# Patient Record
Sex: Female | Born: 1955
Health system: Southern US, Community
[De-identification: ages and names within clinical notes are randomized; demographics above are authoritative.]

## PROBLEM LIST (undated history)

## (undated) DIAGNOSIS — M810 Age-related osteoporosis without current pathological fracture: Secondary | ICD-10-CM

## (undated) DIAGNOSIS — M8580 Other specified disorders of bone density and structure, unspecified site: Secondary | ICD-10-CM

## (undated) DIAGNOSIS — K8020 Calculus of gallbladder without cholecystitis without obstruction: Secondary | ICD-10-CM

## (undated) DIAGNOSIS — Z973 Presence of spectacles and contact lenses: Secondary | ICD-10-CM

## (undated) DIAGNOSIS — R112 Nausea with vomiting, unspecified: Secondary | ICD-10-CM

## (undated) DIAGNOSIS — K589 Irritable bowel syndrome without diarrhea: Secondary | ICD-10-CM

## (undated) DIAGNOSIS — F419 Anxiety disorder, unspecified: Secondary | ICD-10-CM

## (undated) DIAGNOSIS — IMO0001 Reserved for inherently not codable concepts without codable children: Secondary | ICD-10-CM

## (undated) DIAGNOSIS — K5792 Diverticulitis of intestine, part unspecified, without perforation or abscess without bleeding: Secondary | ICD-10-CM

## (undated) DIAGNOSIS — N760 Acute vaginitis: Secondary | ICD-10-CM

## (undated) DIAGNOSIS — Z5189 Encounter for other specified aftercare: Secondary | ICD-10-CM

## (undated) DIAGNOSIS — M722 Plantar fascial fibromatosis: Secondary | ICD-10-CM

## (undated) DIAGNOSIS — K219 Gastro-esophageal reflux disease without esophagitis: Secondary | ICD-10-CM

## (undated) DIAGNOSIS — Z9889 Other specified postprocedural states: Secondary | ICD-10-CM

## (undated) DIAGNOSIS — B009 Herpesviral infection, unspecified: Secondary | ICD-10-CM

## (undated) DIAGNOSIS — N879 Dysplasia of cervix uteri, unspecified: Secondary | ICD-10-CM

## (undated) DIAGNOSIS — Z8719 Personal history of other diseases of the digestive system: Secondary | ICD-10-CM

## (undated) DIAGNOSIS — T7840XA Allergy, unspecified, initial encounter: Secondary | ICD-10-CM

## (undated) DIAGNOSIS — M419 Scoliosis, unspecified: Secondary | ICD-10-CM

## (undated) DIAGNOSIS — M1990 Unspecified osteoarthritis, unspecified site: Secondary | ICD-10-CM

## (undated) DIAGNOSIS — R5383 Other fatigue: Secondary | ICD-10-CM

## (undated) HISTORY — DX: Irritable bowel syndrome, unspecified: K58.9

## (undated) HISTORY — PX: ROTATOR CUFF REPAIR: SHX139

## (undated) HISTORY — DX: Scoliosis, unspecified: M41.9

## (undated) HISTORY — DX: Encounter for other specified aftercare: Z51.89

## (undated) HISTORY — DX: Acute vaginitis: N76.0

## (undated) HISTORY — PX: BACK SURGERY: SHX140

## (undated) HISTORY — DX: Gastro-esophageal reflux disease without esophagitis: K21.9

## (undated) HISTORY — DX: Unspecified osteoarthritis, unspecified site: M19.90

## (undated) HISTORY — PX: PELVIC LAPAROSCOPY: SHX162

## (undated) HISTORY — DX: Plantar fascial fibromatosis: M72.2

## (undated) HISTORY — DX: Herpesviral infection, unspecified: B00.9

## (undated) HISTORY — DX: Other fatigue: R53.83

## (undated) HISTORY — DX: Dysplasia of cervix uteri, unspecified: N87.9

## (undated) HISTORY — PX: BREAST SURGERY: SHX581

## (undated) HISTORY — PX: CYSTOSCOPY: SUR368

## (undated) HISTORY — PX: GYNECOLOGIC CRYOSURGERY: SHX857

## (undated) HISTORY — DX: Presence of spectacles and contact lenses: Z97.3

## (undated) HISTORY — DX: Diverticulitis of intestine, part unspecified, without perforation or abscess without bleeding: K57.92

## (undated) HISTORY — DX: Calculus of gallbladder without cholecystitis without obstruction: K80.20

## (undated) HISTORY — DX: Allergy, unspecified, initial encounter: T78.40XA

## (undated) HISTORY — DX: Reserved for inherently not codable concepts without codable children: IMO0001

---

## 1976-05-15 HISTORY — PX: APPENDECTOMY: SHX54

## 1995-05-16 HISTORY — PX: LAPAROSCOPIC ENDOMETRIOSIS FULGURATION: SUR769

## 1995-08-02 HISTORY — PX: HERNIA REPAIR: SHX51

## 1997-12-11 ENCOUNTER — Ambulatory Visit (HOSPITAL_COMMUNITY): Admission: RE | Admit: 1997-12-11 | Discharge: 1997-12-11 | Payer: Self-pay | Admitting: Gastroenterology

## 1998-01-12 ENCOUNTER — Other Ambulatory Visit: Admission: RE | Admit: 1998-01-12 | Discharge: 1998-01-12 | Payer: Self-pay | Admitting: Obstetrics and Gynecology

## 1999-01-11 ENCOUNTER — Other Ambulatory Visit: Admission: RE | Admit: 1999-01-11 | Discharge: 1999-01-11 | Payer: Self-pay | Admitting: Obstetrics and Gynecology

## 1999-10-17 ENCOUNTER — Encounter: Admission: RE | Admit: 1999-10-17 | Discharge: 1999-10-17 | Payer: Self-pay | Admitting: Family Medicine

## 2000-02-01 ENCOUNTER — Other Ambulatory Visit: Admission: RE | Admit: 2000-02-01 | Discharge: 2000-02-01 | Payer: Self-pay | Admitting: Obstetrics and Gynecology

## 2001-02-06 ENCOUNTER — Other Ambulatory Visit: Admission: RE | Admit: 2001-02-06 | Discharge: 2001-02-06 | Payer: Self-pay | Admitting: Obstetrics and Gynecology

## 2001-02-28 ENCOUNTER — Encounter: Admission: RE | Admit: 2001-02-28 | Discharge: 2001-02-28 | Payer: Self-pay | Admitting: Orthopedic Surgery

## 2001-02-28 ENCOUNTER — Encounter: Payer: Self-pay | Admitting: Orthopedic Surgery

## 2002-02-11 ENCOUNTER — Other Ambulatory Visit: Admission: RE | Admit: 2002-02-11 | Discharge: 2002-02-11 | Payer: Self-pay | Admitting: Obstetrics and Gynecology

## 2003-02-12 ENCOUNTER — Other Ambulatory Visit: Admission: RE | Admit: 2003-02-12 | Discharge: 2003-02-12 | Payer: Self-pay | Admitting: Obstetrics and Gynecology

## 2004-03-11 ENCOUNTER — Other Ambulatory Visit: Admission: RE | Admit: 2004-03-11 | Discharge: 2004-03-11 | Payer: Self-pay | Admitting: Obstetrics and Gynecology

## 2005-03-13 ENCOUNTER — Other Ambulatory Visit: Admission: RE | Admit: 2005-03-13 | Discharge: 2005-03-13 | Payer: Self-pay | Admitting: Obstetrics and Gynecology

## 2006-03-14 ENCOUNTER — Other Ambulatory Visit: Admission: RE | Admit: 2006-03-14 | Discharge: 2006-03-14 | Payer: Self-pay | Admitting: Obstetrics and Gynecology

## 2007-02-12 ENCOUNTER — Encounter: Admission: RE | Admit: 2007-02-12 | Discharge: 2007-02-12 | Payer: Self-pay | Admitting: Obstetrics and Gynecology

## 2007-05-02 ENCOUNTER — Other Ambulatory Visit: Admission: RE | Admit: 2007-05-02 | Discharge: 2007-05-02 | Payer: Self-pay | Admitting: Obstetrics and Gynecology

## 2007-11-11 ENCOUNTER — Encounter: Admission: RE | Admit: 2007-11-11 | Discharge: 2007-11-11 | Payer: Self-pay | Admitting: Obstetrics and Gynecology

## 2008-05-20 ENCOUNTER — Encounter: Payer: Self-pay | Admitting: Obstetrics and Gynecology

## 2008-05-20 ENCOUNTER — Other Ambulatory Visit: Admission: RE | Admit: 2008-05-20 | Discharge: 2008-05-20 | Payer: Self-pay | Admitting: Obstetrics and Gynecology

## 2008-05-20 ENCOUNTER — Ambulatory Visit: Payer: Self-pay | Admitting: Obstetrics and Gynecology

## 2008-06-05 ENCOUNTER — Ambulatory Visit: Payer: Self-pay | Admitting: Obstetrics and Gynecology

## 2009-05-31 ENCOUNTER — Ambulatory Visit: Payer: Self-pay | Admitting: Obstetrics and Gynecology

## 2009-05-31 ENCOUNTER — Other Ambulatory Visit: Admission: RE | Admit: 2009-05-31 | Discharge: 2009-05-31 | Payer: Self-pay | Admitting: Obstetrics and Gynecology

## 2009-09-08 ENCOUNTER — Ambulatory Visit: Payer: Self-pay | Admitting: Obstetrics and Gynecology

## 2009-10-04 ENCOUNTER — Encounter: Admission: RE | Admit: 2009-10-04 | Discharge: 2009-10-04 | Payer: Self-pay | Admitting: Orthopaedic Surgery

## 2009-10-12 ENCOUNTER — Ambulatory Visit: Payer: Self-pay | Admitting: Obstetrics and Gynecology

## 2010-01-13 ENCOUNTER — Ambulatory Visit: Payer: Self-pay | Admitting: Obstetrics and Gynecology

## 2010-06-07 ENCOUNTER — Other Ambulatory Visit (HOSPITAL_COMMUNITY): Admission: RE | Admit: 2010-06-07 | Payer: Self-pay | Source: Ambulatory Visit | Admitting: Obstetrics and Gynecology

## 2010-06-07 ENCOUNTER — Ambulatory Visit
Admission: RE | Admit: 2010-06-07 | Discharge: 2010-06-07 | Payer: Self-pay | Source: Home / Self Care | Attending: Obstetrics and Gynecology | Admitting: Obstetrics and Gynecology

## 2010-06-07 ENCOUNTER — Other Ambulatory Visit (HOSPITAL_COMMUNITY)
Admission: RE | Admit: 2010-06-07 | Discharge: 2010-06-07 | Disposition: A | Discharge status: Home / Self Care | Payer: 59 | Source: Ambulatory Visit | Attending: Obstetrics and Gynecology | Admitting: Obstetrics and Gynecology

## 2010-06-07 ENCOUNTER — Other Ambulatory Visit: Payer: Self-pay | Admitting: Obstetrics and Gynecology

## 2010-06-07 DIAGNOSIS — Z124 Encounter for screening for malignant neoplasm of cervix: Secondary | ICD-10-CM | POA: Insufficient documentation

## 2010-10-04 ENCOUNTER — Encounter (INDEPENDENT_AMBULATORY_CARE_PROVIDER_SITE_OTHER): Payer: 59

## 2010-10-04 DIAGNOSIS — M810 Age-related osteoporosis without current pathological fracture: Secondary | ICD-10-CM

## 2010-11-28 ENCOUNTER — Other Ambulatory Visit: Payer: Self-pay | Admitting: Obstetrics and Gynecology

## 2010-11-28 DIAGNOSIS — R928 Other abnormal and inconclusive findings on diagnostic imaging of breast: Secondary | ICD-10-CM

## 2010-11-30 ENCOUNTER — Encounter (INDEPENDENT_AMBULATORY_CARE_PROVIDER_SITE_OTHER): Payer: Self-pay | Admitting: General Surgery

## 2010-12-01 ENCOUNTER — Encounter (INDEPENDENT_AMBULATORY_CARE_PROVIDER_SITE_OTHER): Payer: 59 | Admitting: General Surgery

## 2010-12-14 ENCOUNTER — Encounter (INDEPENDENT_AMBULATORY_CARE_PROVIDER_SITE_OTHER): Payer: Self-pay | Admitting: General Surgery

## 2010-12-15 ENCOUNTER — Ambulatory Visit (INDEPENDENT_AMBULATORY_CARE_PROVIDER_SITE_OTHER): Payer: 59 | Admitting: General Surgery

## 2010-12-15 ENCOUNTER — Other Ambulatory Visit: Payer: Self-pay | Admitting: Obstetrics and Gynecology

## 2010-12-15 ENCOUNTER — Encounter (INDEPENDENT_AMBULATORY_CARE_PROVIDER_SITE_OTHER): Payer: Self-pay | Admitting: General Surgery

## 2010-12-15 ENCOUNTER — Ambulatory Visit
Admission: RE | Admit: 2010-12-15 | Discharge: 2010-12-15 | Disposition: A | Discharge status: Home / Self Care | Payer: 59 | Source: Ambulatory Visit | Attending: Obstetrics and Gynecology | Admitting: Obstetrics and Gynecology

## 2010-12-15 VITALS — BP 106/72 | Ht 70.0 in | Wt 137.4 lb

## 2010-12-15 DIAGNOSIS — R928 Other abnormal and inconclusive findings on diagnostic imaging of breast: Secondary | ICD-10-CM

## 2010-12-15 DIAGNOSIS — K6289 Other specified diseases of anus and rectum: Secondary | ICD-10-CM

## 2010-12-15 NOTE — Patient Instructions (Addendum)
Continue your exercise program as a walk-in and really only used the lidocaine 5% ointment for anal pain continue keeping her stools soft never hard and return to see me in 2 months unless her having more severe pain.

## 2010-12-15 NOTE — Progress Notes (Signed)
Subjective:     Patient ID: Yolanda Carroll, female   DOB: Nov 26, 1955, 55 y.o.   MRN: [id number]  HPI The patient returns she is now approximately 2 months since I last saw her and states that she's never had any severe pain she's not noticed any blood in her stools but when she tries to walk she is aware that she is having a little discomfort around her anus that she finds the morning of. She is not in spasm today and on examination Limited predominantly to the anus is no evidence of any external hemorrhoids after cleaning the anus very thoroughly I see no evidence of a definite fissure on digital exam there is no spasm I did an anoscopic exam and can not see any evidence of a fissure anterior or posterior and does very little internal hemorrhoids  I got Dr.Toth to reexamine her and he was also not able to find any spasm but maybe a little fluid so fissural progress trauma from repeated anoscopic exams and was in agreement that he would not plan on a type of procedure at this time with the minimal type of symptom. I would encourage the patient to be as active as possible and if she is having more severe plans to see [place] sooner otherwise let me see her in 2 months  I cannot see a definite fissure at this time even though she's been treated for a fissure but Dr. Deloris Ping and when I saw her originally and also the day we've not been able to do a definite fissure she's never had any blood in her stools or having a severe anal spasm like fissure patient usually have Review of Systems     Objective:   Physical Exam     Assessment:        Plan:       Regular H. are consistent stools to prevent constipation but try not to minimize her activities I'll see the patient in 2 months and she has lidocaine ointment if needed but is not usually had a regular basis

## 2011-02-27 ENCOUNTER — Encounter: Payer: Self-pay | Admitting: Obstetrics and Gynecology

## 2011-03-03 ENCOUNTER — Ambulatory Visit (INDEPENDENT_AMBULATORY_CARE_PROVIDER_SITE_OTHER): Payer: 59

## 2011-03-03 DIAGNOSIS — Z23 Encounter for immunization: Secondary | ICD-10-CM

## 2011-03-07 ENCOUNTER — Encounter (INDEPENDENT_AMBULATORY_CARE_PROVIDER_SITE_OTHER): Payer: Self-pay | Admitting: General Surgery

## 2011-03-07 ENCOUNTER — Ambulatory Visit (INDEPENDENT_AMBULATORY_CARE_PROVIDER_SITE_OTHER): Payer: 59 | Admitting: General Surgery

## 2011-03-07 VITALS — BP 98/62 | HR 64 | Temp 97.8°F | Resp 16 | Ht 68.5 in | Wt 139.0 lb

## 2011-03-07 DIAGNOSIS — L089 Local infection of the skin and subcutaneous tissue, unspecified: Secondary | ICD-10-CM

## 2011-03-07 NOTE — Patient Instructions (Signed)
Plan small amount of Mycolog cream b.i.d. after thoroughly washing the area. Return to see [place] in 3 weeks

## 2011-03-07 NOTE — Progress Notes (Signed)
Subjective:     Patient ID: Yolanda Carroll, female   DOB: [birth year], 55 y.o.   MRN: [id number]  HPIThe patient returned and on examination is still complaining of some pain in the perianal area. When I originally saw her probably 6 months ago she had been treated for anal fissure that I could find no evidence of an actual fissure and then when I saw her approximately 2 months ago and Dr.Toth examiner and he was questioned whether there was a little spasm On rectal exam but we could not see any abscess or obvious fissure. She returns now as I recommended and states that she is having a little area of pain to the left of her anus but does not describe this as a mass   Review of Systems Current Outpatient Prescriptions  Medication Sig Dispense Refill  . calcium-vitamin D (OSCAL) 250-125 MG-UNIT per tablet Take 1 tablet by mouth daily.        . divalproex (DEPAKOTE) 500 MG 24 hr tablet Take 500 mg by mouth daily.        Marland Kitchen estradiol (ESTRACE) 0.5 MG tablet Take 0.5 mg by mouth daily.        . fish oil-omega-3 fatty acids 1000 MG capsule Take 2 g by mouth daily.        . medroxyPROGESTERone (PROVERA) 2.5 MG tablet Take 2.5 mg by mouth daily.        . Multiple Vitamins-Minerals (MULTIVITAMIN WITH MINERALS) tablet Take 1 tablet by mouth daily.        . nefazodone (SERZONE) 50 MG tablet Take 75 mg by mouth daily.        . Probiotic Product (PROBIOTIC FORMULA PO) Take by mouth daily.         Allergies  Allergen Reactions  . Darvocet (Propoxyphene N-Acetaminophen)   . Penicillins   . Percocet (Oxycodone-Acetaminophen)   . Vicodin (Hydrocodone-Acetaminophen)     Past Surgical History  Procedure Date  . Appendectomy   . Rotator cuff repair     right 2002 left 1999  . Hernia repair 08/02/1995    [facility]  . Laparoscopic endometriosis fulguration        Objective:   Physical ExamBP 98/62  Pulse 64  Temp(Src) 97.8 F (36.6 C) (Temporal)  Resp 16  Ht 5' 8.5" (1.74 m)  Wt 139 lb (63.05 kg)   BMI 20.83 kg/m2    Examination for known limb into the anus shows no evidence of any rectal spasm on digital exam and anoscopic exam shows no evidence of any problems within her anus or posterior anal fissure to the left side lateral there is a small area of centimeter or so it has multiple little block skin pole 5 or 6 little not truly pustule but definitely different than the other surrounding areas of the anus. I got Dr. Derrell Lolling to examine her and he was in agreement that these were locked skin pole but didn't think it was anything significant there we looked at his new colorectal book and could not find anything in it that for his perianal skin problem. I would recommend that she scrubbed the area twice a day Anusol Mycolog cream for 3 days and be examined her in approximately 2-3 week if the little areas persist I think I could lay have some bowel movements and one for pathology and that's it we'll turn up anything of significance. Whether these are the little areas that she is complaining of of the last few month I  am not sure that I had not recognized these previously Assessment:    Small patch of perianal skin that looks like this block and superficial pores, no evidence of a fissure or true perirectal infection. Washed the area twice a day and apply Mycolog cream to the small localized areas after and repeat examination in 3 weeks     Plan:     See above note

## 2011-03-28 ENCOUNTER — Encounter: Payer: Self-pay | Admitting: Gynecology

## 2011-03-28 DIAGNOSIS — M810 Age-related osteoporosis without current pathological fracture: Secondary | ICD-10-CM | POA: Insufficient documentation

## 2011-03-30 ENCOUNTER — Encounter (INDEPENDENT_AMBULATORY_CARE_PROVIDER_SITE_OTHER): Payer: 59 | Admitting: General Surgery

## 2011-04-05 ENCOUNTER — Encounter (INDEPENDENT_AMBULATORY_CARE_PROVIDER_SITE_OTHER): Payer: Self-pay | Admitting: General Surgery

## 2011-04-05 ENCOUNTER — Encounter: Payer: Self-pay | Admitting: Obstetrics and Gynecology

## 2011-04-05 ENCOUNTER — Ambulatory Visit (INDEPENDENT_AMBULATORY_CARE_PROVIDER_SITE_OTHER): Payer: 59 | Admitting: Obstetrics and Gynecology

## 2011-04-05 DIAGNOSIS — N644 Mastodynia: Secondary | ICD-10-CM

## 2011-04-05 DIAGNOSIS — N6019 Diffuse cystic mastopathy of unspecified breast: Secondary | ICD-10-CM

## 2011-04-05 NOTE — Progress Notes (Signed)
Patient came to see me today with a six-week history of right breast tenderness. It is intermittent but always occurs in the right upper outer quadrant of the right breast. She does take continuous HRT. She is doing well on it. We is ordered does several years ago because of mastodynia. She takes vitamin E daily. She does not use caffeine. She has screening mammogram this Summer in [place]. She needed a followup diagnostic mammogram of the left breast and that was done in [place] and was normal.   Breast exam: Olegario Shearer present. Both her breasts were inspected and are without skin changes. Both breasts were then examined both in the sitting and lying position and were without dominant lesion. She clearly however is more lumpy in the upper outer quadrant of the right breast without dominant lesion.  Assessment: Mastodynia. Fibrocystic breast disease.  Plan: She will continue estradiol half a milligram with medroxyprogesterone 2.5 mg daily but we'll take a four-day break every month. She'll start that today. She will continue 400 mg vitamin E daily. She will continue to be caffeine free. If the above persists she will call us and we will get a focused mammogram of the right breast.

## 2011-04-10 ENCOUNTER — Other Ambulatory Visit: Payer: Self-pay | Admitting: Rehabilitation

## 2011-04-10 DIAGNOSIS — M542 Cervicalgia: Secondary | ICD-10-CM

## 2011-04-11 ENCOUNTER — Ambulatory Visit
Admission: RE | Admit: 2011-04-11 | Discharge: 2011-04-11 | Disposition: A | Discharge status: Home / Self Care | Payer: 59 | Source: Ambulatory Visit | Attending: Rehabilitation | Admitting: Rehabilitation

## 2011-04-11 ENCOUNTER — Encounter (INDEPENDENT_AMBULATORY_CARE_PROVIDER_SITE_OTHER): Payer: 59 | Admitting: General Surgery

## 2011-04-11 DIAGNOSIS — M542 Cervicalgia: Secondary | ICD-10-CM

## 2011-04-14 ENCOUNTER — Other Ambulatory Visit: Payer: Self-pay | Admitting: Neurosurgery

## 2011-04-14 ENCOUNTER — Encounter (HOSPITAL_COMMUNITY): Payer: Self-pay | Admitting: *Deleted

## 2011-04-14 ENCOUNTER — Encounter (HOSPITAL_COMMUNITY): Payer: Self-pay | Admitting: Pharmacy Technician

## 2011-04-16 MED ORDER — VANCOMYCIN HCL IN DEXTROSE 1-5 GM/200ML-% IV SOLN
1000.0000 mg | INTRAVENOUS | Status: AC
  Administered 2011-04-17: 1000 mg via INTRAVENOUS
  Filled 2011-04-16 (×3): qty 200

## 2011-04-17 ENCOUNTER — Encounter (HOSPITAL_COMMUNITY)
Admission: RE | Disposition: A | Discharge status: Home / Self Care | Payer: Self-pay | Source: Ambulatory Visit | Attending: Neurosurgery

## 2011-04-17 ENCOUNTER — Encounter (HOSPITAL_COMMUNITY): Payer: Self-pay | Admitting: Certified Registered"

## 2011-04-17 ENCOUNTER — Ambulatory Visit (HOSPITAL_COMMUNITY)
Admission: RE | Admit: 2011-04-17 | Discharge: 2011-04-18 | Disposition: A | Discharge status: Home / Self Care | Payer: 59 | Source: Ambulatory Visit | Attending: Neurosurgery | Admitting: Neurosurgery

## 2011-04-17 ENCOUNTER — Ambulatory Visit (HOSPITAL_COMMUNITY): Payer: 59

## 2011-04-17 ENCOUNTER — Encounter (HOSPITAL_COMMUNITY): Payer: Self-pay | Admitting: *Deleted

## 2011-04-17 ENCOUNTER — Encounter (INDEPENDENT_AMBULATORY_CARE_PROVIDER_SITE_OTHER): Payer: 59 | Admitting: General Surgery

## 2011-04-17 ENCOUNTER — Ambulatory Visit (HOSPITAL_COMMUNITY): Payer: 59 | Admitting: Certified Registered"

## 2011-04-17 DIAGNOSIS — M5010 Cervical disc disorder with radiculopathy, unspecified cervical region: Secondary | ICD-10-CM | POA: Diagnosis present

## 2011-04-17 DIAGNOSIS — M5020 Other cervical disc displacement, unspecified cervical region: Secondary | ICD-10-CM | POA: Insufficient documentation

## 2011-04-17 DIAGNOSIS — M810 Age-related osteoporosis without current pathological fracture: Secondary | ICD-10-CM | POA: Insufficient documentation

## 2011-04-17 DIAGNOSIS — M5030 Other cervical disc degeneration, unspecified cervical region: Secondary | ICD-10-CM | POA: Insufficient documentation

## 2011-04-17 DIAGNOSIS — K219 Gastro-esophageal reflux disease without esophagitis: Secondary | ICD-10-CM | POA: Insufficient documentation

## 2011-04-17 DIAGNOSIS — M47812 Spondylosis without myelopathy or radiculopathy, cervical region: Secondary | ICD-10-CM | POA: Insufficient documentation

## 2011-04-17 DIAGNOSIS — R51 Headache: Secondary | ICD-10-CM | POA: Insufficient documentation

## 2011-04-17 HISTORY — DX: Other specified postprocedural states: R11.2

## 2011-04-17 HISTORY — PX: ANTERIOR CERVICAL DECOMP/DISCECTOMY FUSION: SHX1161

## 2011-04-17 HISTORY — DX: Other specified postprocedural states: Z98.890

## 2011-04-17 LAB — SURGICAL PCR SCREEN
MRSA, PCR: NEGATIVE
Staphylococcus aureus: NEGATIVE

## 2011-04-17 LAB — CBC
HCT: 38 % (ref 36.0–46.0)
Hemoglobin: 12.6 g/dL (ref 12.0–15.0)
MCH: 30.5 pg (ref 26.0–34.0)
MCHC: 33.2 g/dL (ref 30.0–36.0)
MCV: 92 fL (ref 78.0–100.0)
Platelets: 193 10*3/uL (ref 150–400)
RBC: 4.13 MIL/uL (ref 3.87–5.11)
RDW: 12.6 % (ref 11.5–15.5)
WBC: 5.3 10*3/uL (ref 4.0–10.5)

## 2011-04-17 SURGERY — ANTERIOR CERVICAL DECOMPRESSION/DISCECTOMY FUSION 2 LEVELS
Anesthesia: General | Site: Neck | Wound class: Clean

## 2011-04-17 MED ORDER — SODIUM CHLORIDE 0.9 % IV SOLN: 250.0000 mL | INTRAVENOUS | Status: DC

## 2011-04-17 MED ORDER — HYDROCODONE-ACETAMINOPHEN 5-325 MG PO TABS: 1.0000 | ORAL_TABLET | ORAL | Status: DC | PRN

## 2011-04-17 MED ORDER — BUPIVACAINE HCL (PF) 0.25 % IJ SOLN
INTRAMUSCULAR | Status: DC | PRN
  Administered 2011-04-17: 4.75 mL

## 2011-04-17 MED ORDER — SODIUM CHLORIDE 0.9 % IJ SOLN: 3.0000 mL | INTRAMUSCULAR | Status: DC | PRN

## 2011-04-17 MED ORDER — SODIUM CHLORIDE 0.9 % IR SOLN
Status: DC | PRN
  Administered 2011-04-17: 09:00:00

## 2011-04-17 MED ORDER — MAGNESIUM HYDROXIDE 400 MG/5ML PO SUSP: 30.0000 mL | Freq: Every day | ORAL | Status: DC | PRN

## 2011-04-17 MED ORDER — LACTATED RINGERS IV SOLN
INTRAVENOUS | Status: DC | PRN
  Administered 2011-04-17 (×2): via INTRAVENOUS

## 2011-04-17 MED ORDER — SCOPOLAMINE 1 MG/3DAYS TD PT72
MEDICATED_PATCH | TRANSDERMAL | Status: DC | PRN
  Administered 2011-04-17: 1 via TRANSDERMAL

## 2011-04-17 MED ORDER — KCL IN DEXTROSE-NACL 20-5-0.45 MEQ/L-%-% IV SOLN
INTRAVENOUS | Status: DC
  Administered 2011-04-17: 14:00:00 via INTRAVENOUS
  Filled 2011-04-17 (×5): qty 1000

## 2011-04-17 MED ORDER — ACETAMINOPHEN 650 MG RE SUPP: 650.0000 mg | RECTAL | Status: DC | PRN

## 2011-04-17 MED ORDER — MIDAZOLAM HCL 5 MG/5ML IJ SOLN
INTRAMUSCULAR | Status: DC | PRN
  Administered 2011-04-17: 2 mg via INTRAVENOUS

## 2011-04-17 MED ORDER — LIDOCAINE-EPINEPHRINE 1 %-1:100000 IJ SOLN
INTRAMUSCULAR | Status: DC | PRN
  Administered 2011-04-17: 4.75 mL

## 2011-04-17 MED ORDER — CYCLOBENZAPRINE HCL 10 MG PO TABS
10.0000 mg | ORAL_TABLET | Freq: Three times a day (TID) | ORAL | Status: DC | PRN
  Filled 2011-04-17: qty 1

## 2011-04-17 MED ORDER — THROMBIN 5000 UNITS EX KIT
PACK | CUTANEOUS | Status: DC | PRN
  Administered 2011-04-17: 5000 [IU] via TOPICAL

## 2011-04-17 MED ORDER — DIVALPROEX SODIUM ER 500 MG PO TB24
500.0000 mg | ORAL_TABLET | Freq: Every day | ORAL | Status: DC
  Administered 2011-04-18: 500 mg via ORAL
  Filled 2011-04-17 (×2): qty 1

## 2011-04-17 MED ORDER — HYDROXYZINE HCL 50 MG PO TABS
50.0000 mg | ORAL_TABLET | ORAL | Status: DC | PRN
  Filled 2011-04-17: qty 1

## 2011-04-17 MED ORDER — LIDOCAINE HCL (CARDIAC) 20 MG/ML IV SOLN
INTRAVENOUS | Status: DC | PRN
  Administered 2011-04-17: 50 mg via INTRAVENOUS

## 2011-04-17 MED ORDER — PROMETHAZINE HCL 25 MG/ML IJ SOLN: 6.2500 mg | INTRAMUSCULAR | Status: DC | PRN

## 2011-04-17 MED ORDER — SODIUM CHLORIDE 0.9 % IR SOLN
Status: DC | PRN
  Administered 2011-04-17: 1000 mL

## 2011-04-17 MED ORDER — NEOSTIGMINE METHYLSULFATE 1 MG/ML IJ SOLN
INTRAMUSCULAR | Status: DC | PRN
  Administered 2011-04-17: 3 mg via INTRAVENOUS

## 2011-04-17 MED ORDER — ONDANSETRON HCL 4 MG/2ML IJ SOLN
INTRAMUSCULAR | Status: DC | PRN
  Administered 2011-04-17: 4 mg via INTRAVENOUS

## 2011-04-17 MED ORDER — SODIUM CHLORIDE 0.9 % IJ SOLN
3.0000 mL | Freq: Two times a day (BID) | INTRAMUSCULAR | Status: DC
  Administered 2011-04-17: 3 mL via INTRAVENOUS

## 2011-04-17 MED ORDER — KETOROLAC TROMETHAMINE 30 MG/ML IJ SOLN
30.0000 mg | Freq: Four times a day (QID) | INTRAMUSCULAR | Status: DC
  Administered 2011-04-17 – 2011-04-18 (×4): 30 mg via INTRAVENOUS
  Filled 2011-04-17 (×8): qty 1

## 2011-04-17 MED ORDER — EPHEDRINE SULFATE 50 MG/ML IJ SOLN
INTRAMUSCULAR | Status: DC | PRN
  Administered 2011-04-17 (×3): 10 mg via INTRAVENOUS

## 2011-04-17 MED ORDER — KETOROLAC TROMETHAMINE 30 MG/ML IJ SOLN: 30.0000 mg | Freq: Once | INTRAMUSCULAR | Status: DC

## 2011-04-17 MED ORDER — ZOLPIDEM TARTRATE 10 MG PO TABS: 10.0000 mg | ORAL_TABLET | Freq: Every evening | ORAL | Status: DC | PRN

## 2011-04-17 MED ORDER — MUPIROCIN 2 % EX OINT
TOPICAL_OINTMENT | Freq: Two times a day (BID) | CUTANEOUS | Status: DC
  Administered 2011-04-17 (×2): via NASAL

## 2011-04-17 MED ORDER — FENTANYL CITRATE 0.05 MG/ML IJ SOLN
50.0000 ug | Freq: Once | INTRAMUSCULAR | Status: AC
  Administered 2011-04-17: 50 ug via INTRAVENOUS

## 2011-04-17 MED ORDER — MUPIROCIN 2 % EX OINT
TOPICAL_OINTMENT | CUTANEOUS | Status: AC
  Filled 2011-04-17: qty 22

## 2011-04-17 MED ORDER — GENTAMICIN IN SALINE 1.6-0.9 MG/ML-% IV SOLN
80.0000 mg | INTRAVENOUS | Status: AC
  Administered 2011-04-17: 80 mg via INTRAVENOUS
  Filled 2011-04-17: qty 50

## 2011-04-17 MED ORDER — GLYCOPYRROLATE 0.2 MG/ML IJ SOLN
INTRAMUSCULAR | Status: DC | PRN
  Administered 2011-04-17: .4 mg via INTRAVENOUS

## 2011-04-17 MED ORDER — HYDROMORPHONE HCL 2 MG PO TABS
2.0000 mg | ORAL_TABLET | ORAL | Status: DC | PRN
  Administered 2011-04-17 – 2011-04-18 (×3): 4 mg via ORAL
  Filled 2011-04-17 (×3): qty 2

## 2011-04-17 MED ORDER — ALUM & MAG HYDROXIDE-SIMETH 400-400-40 MG/5ML PO SUSP
30.0000 mL | Freq: Four times a day (QID) | ORAL | Status: DC | PRN
  Filled 2011-04-17: qty 30

## 2011-04-17 MED ORDER — MIDAZOLAM HCL 2 MG/2ML IJ SOLN: 0.5000 mg | Freq: Once | INTRAMUSCULAR | Status: DC | PRN

## 2011-04-17 MED ORDER — MEPERIDINE HCL 25 MG/ML IJ SOLN: 6.2500 mg | INTRAMUSCULAR | Status: DC | PRN

## 2011-04-17 MED ORDER — PHENOL 1.4 % MT LIQD
1.0000 | OROMUCOSAL | Status: DC | PRN
Start: 2011-04-17 — End: 2011-04-18

## 2011-04-17 MED ORDER — SUFENTANIL CITRATE 50 MCG/ML IV SOLN
INTRAVENOUS | Status: DC | PRN
  Administered 2011-04-17 (×2): 5 ug via INTRAVENOUS
  Administered 2011-04-17 (×2): 10 ug via INTRAVENOUS
  Administered 2011-04-17 (×2): 5 ug via INTRAVENOUS
  Administered 2011-04-17: 10 ug via INTRAVENOUS

## 2011-04-17 MED ORDER — DOCUSATE SODIUM 100 MG PO CAPS
100.0000 mg | ORAL_CAPSULE | Freq: Two times a day (BID) | ORAL | Status: DC
  Administered 2011-04-17: 100 mg via ORAL
  Filled 2011-04-17: qty 1

## 2011-04-17 MED ORDER — HEMOSTATIC AGENTS (NO CHARGE) OPTIME
TOPICAL | Status: DC | PRN
  Administered 2011-04-17: 1 via TOPICAL

## 2011-04-17 MED ORDER — THROMBIN 20000 UNITS EX KIT
PACK | CUTANEOUS | Status: DC | PRN
  Administered 2011-04-17: 10:00:00 via TOPICAL

## 2011-04-17 MED ORDER — PROPOFOL 10 MG/ML IV EMUL
INTRAVENOUS | Status: DC | PRN
  Administered 2011-04-17: 170 mg via INTRAVENOUS

## 2011-04-17 MED ORDER — MORPHINE SULFATE 4 MG/ML IJ SOLN
4.0000 mg | INTRAMUSCULAR | Status: DC | PRN
  Administered 2011-04-17: 4 mg via INTRAMUSCULAR
  Filled 2011-04-17: qty 1

## 2011-04-17 MED ORDER — HYDROXYZINE HCL 50 MG/ML IM SOLN: 50.0000 mg | INTRAMUSCULAR | Status: DC | PRN

## 2011-04-17 MED ORDER — ACETAMINOPHEN 325 MG PO TABS: 650.0000 mg | ORAL_TABLET | ORAL | Status: DC | PRN

## 2011-04-17 MED ORDER — MENTHOL 3 MG MT LOZG: 1.0000 | LOZENGE | OROMUCOSAL | Status: DC | PRN

## 2011-04-17 MED ORDER — ROCURONIUM BROMIDE 100 MG/10ML IV SOLN
INTRAVENOUS | Status: DC | PRN
  Administered 2011-04-17: 40 mg via INTRAVENOUS
  Administered 2011-04-17: 10 mg via INTRAVENOUS

## 2011-04-17 MED ORDER — GENTAMICIN IN SALINE 1.6-0.9 MG/ML-% IV SOLN
INTRAVENOUS | Status: AC
  Filled 2011-04-17: qty 50

## 2011-04-17 MED ORDER — NEFAZODONE HCL 150 MG PO TABS
75.0000 mg | ORAL_TABLET | Freq: Every day | ORAL | Status: DC
  Filled 2011-04-17 (×2): qty 1

## 2011-04-17 SURGICAL SUPPLY — 56 items
ADH SKN CLS APL DERMABOND .7 (GAUZE/BANDAGES/DRESSINGS) ×1
ALLOGRAFT CA 6X14X11 (Bone Implant) ×2 IMPLANT
BAG DECANTER FOR FLEXI CONT (MISCELLANEOUS) ×2 IMPLANT
BIT DRILL NEURO 2X3.1 SFT TUCH (MISCELLANEOUS) ×1 IMPLANT
BLADE ULTRA TIP 2M (BLADE) ×2 IMPLANT
BRUSH SCRUB EZ PLAIN DRY (MISCELLANEOUS) ×2 IMPLANT
CANISTER SUCTION 2500CC (MISCELLANEOUS) ×2 IMPLANT
CLOTH BEACON ORANGE TIMEOUT ST (SAFETY) ×2 IMPLANT
COLLAR UNIVERSAL CERV 1022 00 (SOFTGOODS) ×1 IMPLANT
CONT SPEC 4OZ CLIKSEAL STRL BL (MISCELLANEOUS) ×2 IMPLANT
COVER MAYO STAND STRL (DRAPES) ×2 IMPLANT
DECANTER SPIKE VIAL GLASS SM (MISCELLANEOUS) ×2 IMPLANT
DERMABOND ADVANCED (GAUZE/BANDAGES/DRESSINGS) ×1
DERMABOND ADVANCED .7 DNX12 (GAUZE/BANDAGES/DRESSINGS) ×1 IMPLANT
DRAPE LAPAROTOMY 100X72 PEDS (DRAPES) ×2 IMPLANT
DRAPE MICROSCOPE LEICA (MISCELLANEOUS) ×2 IMPLANT
DRAPE POUCH INSTRU U-SHP 10X18 (DRAPES) ×2 IMPLANT
DRAPE PROXIMA HALF (DRAPES) ×1 IMPLANT
DRILL NEURO 2X3.1 SOFT TOUCH (MISCELLANEOUS) ×2
ELECT COATED BLADE 2.86 ST (ELECTRODE) ×2 IMPLANT
ELECT REM PT RETURN 9FT ADLT (ELECTROSURGICAL) ×2
ELECTRODE REM PT RTRN 9FT ADLT (ELECTROSURGICAL) ×1 IMPLANT
GLOVE BIO SURGEON STRL SZ8.5 (GLOVE) ×1 IMPLANT
GLOVE BIOGEL PI IND STRL 8 (GLOVE) ×1 IMPLANT
GLOVE BIOGEL PI INDICATOR 8 (GLOVE) ×1
GLOVE ECLIPSE 7.5 STRL STRAW (GLOVE) ×5 IMPLANT
GLOVE EXAM NITRILE LRG STRL (GLOVE) IMPLANT
GLOVE EXAM NITRILE MD LF STRL (GLOVE) ×1 IMPLANT
GLOVE EXAM NITRILE XL STR (GLOVE) IMPLANT
GLOVE EXAM NITRILE XS STR PU (GLOVE) IMPLANT
GLOVE SS BIOGEL STRL SZ 8 (GLOVE) IMPLANT
GLOVE SUPERSENSE BIOGEL SZ 8 (GLOVE) ×1
GOWN BRE IMP SLV AUR LG STRL (GOWN DISPOSABLE) ×1 IMPLANT
GOWN BRE IMP SLV AUR XL STRL (GOWN DISPOSABLE) ×2 IMPLANT
GOWN STRL REIN 2XL LVL4 (GOWN DISPOSABLE) IMPLANT
HEAD HALTER (SOFTGOODS) ×2 IMPLANT
KIT BASIN OR (CUSTOM PROCEDURE TRAY) ×2 IMPLANT
KIT ROOM TURNOVER OR (KITS) ×2 IMPLANT
NDL HYPO 25X1 1.5 SAFETY (NEEDLE) ×1 IMPLANT
NDL SPNL 22GX3.5 QUINCKE BK (NEEDLE) ×1 IMPLANT
NEEDLE HYPO 25X1 1.5 SAFETY (NEEDLE) ×2 IMPLANT
NEEDLE SPNL 22GX3.5 QUINCKE BK (NEEDLE) ×2 IMPLANT
NS IRRIG 1000ML POUR BTL (IV SOLUTION) ×2 IMPLANT
PACK LAMINECTOMY NEURO (CUSTOM PROCEDURE TRAY) ×2 IMPLANT
PAD ARMBOARD 7.5X6 YLW CONV (MISCELLANEOUS) ×6 IMPLANT
PATTIES SURGICAL 1X1 (DISPOSABLE) ×1 IMPLANT
RUBBERBAND STERILE (MISCELLANEOUS) ×4 IMPLANT
SPONGE INTESTINAL PEANUT (DISPOSABLE) ×2 IMPLANT
SPONGE SURGIFOAM ABS GEL 100 (HEMOSTASIS) ×1 IMPLANT
SPONGE SURGIFOAM ABS GEL SZ50 (HEMOSTASIS) ×2 IMPLANT
SUT VIC AB 2-0 CP2 18 (SUTURE) ×2 IMPLANT
SUT VIC AB 3-0 SH 8-18 (SUTURE) ×2 IMPLANT
SYR 20ML ECCENTRIC (SYRINGE) ×2 IMPLANT
TOWEL OR 17X24 6PK STRL BLUE (TOWEL DISPOSABLE) ×2 IMPLANT
TOWEL OR 17X26 10 PK STRL BLUE (TOWEL DISPOSABLE) ×2 IMPLANT
WATER STERILE IRR 1000ML POUR (IV SOLUTION) ×2 IMPLANT

## 2011-04-17 NOTE — Plan of Care (Signed)
Problem: Consults Goal: Diagnosis - Spinal Surgery Cervical Spine Fusion     

## 2011-04-17 NOTE — Progress Notes (Signed)
Filed Vitals:   04/17/11 1115 04/17/11 1130 04/17/11 1208 04/17/11 1600  BP:   121/72 95/58  Pulse: 74 83 79 83  Temp:  97.8 F (36.6 C) 97.5 F (36.4 C) 97.8 F (36.6 C)  TempSrc:      Resp: 8 12 16 16   Weight:      SpO2: 100% 100% 100% 98%    CBC  Basename 04/17/11 0651  WBC 5.3  HGB 12.6  HCT 38.0  PLT 193    Patient is up and ambulating in the halls. She notes excellent relief of her right upper extremity pain. On exam her incision is clean and dry she is wearing her soft collar. We find the strength is much improved in the right triceps, the right biceps is 5 the right triceps is 4+ to 5. The patient has voided.   Plan: Continue postoperative support IV is to be changed to a saline lock once taking well by mouth.

## 2011-04-17 NOTE — Transfer of Care (Signed)
Immediate Anesthesia Transfer of Care Note  Patient: Yolanda Carroll  Procedure(s) Performed:  ANTERIOR CERVICAL DECOMPRESSION/DISCECTOMY FUSION 2 LEVELS - Cervical five-six, six-seven anterior cervical decompression with fusion,  plating,  and bonegraft   Patient Location: PACU  Anesthesia Type: General  Level of Consciousness: awake, alert  and oriented  Airway & Oxygen Therapy: Patient Spontanous Breathing and Patient connected to nasal cannula oxygen  Post-op Assessment: Report given to PACU RN  Post vital signs: Reviewed and stable  Complications: No apparent anesthesia complications

## 2011-04-17 NOTE — Anesthesia Preprocedure Evaluation (Addendum)
Anesthesia Evaluation  Patient identified by MRN, date of birth, ID band Patient awake    Reviewed: Allergy & Precautions, H&P , NPO status , Patient's Chart, lab work & pertinent test results, reviewed documented beta blocker date and time   History of Anesthesia Complications (+) PONV  Airway Mallampati: II TM Distance: >3 FB Neck ROM: Full    Dental  (+) Teeth Intact and Dental Advisory Given   Pulmonary          Cardiovascular     Neuro/Psych  Headaches,    GI/Hepatic GERD-  ,  Endo/Other    Renal/GU      Musculoskeletal   Abdominal   Peds  Hematology   Anesthesia Other Findings   Reproductive/Obstetrics                         Anesthesia Physical Anesthesia Plan  ASA: II  Anesthesia Plan: General   Post-op Pain Management:    Induction: Intravenous  Airway Management Planned: Oral ETT  Additional Equipment:   Intra-op Plan:   Post-operative Plan: Extubation in OR  Informed Consent: I have reviewed the patients History and Physical, chart, labs and discussed the procedure including the risks, benefits and alternatives for the proposed anesthesia with the patient or authorized representative who has indicated his/her understanding and acceptance.   Dental advisory given  Plan Discussed with: CRNA  Anesthesia Plan Comments:         Anesthesia Quick Evaluation

## 2011-04-17 NOTE — H&P (Signed)
Subjective: Patient is a 55 y.o. female who is admitted for treatment of neck pain and right cervical radiculopathy secondary to cervical disc herniations superimposed on cervical spondylosis and cervical degenerative disc disease at the C5-6 and C6-7 levels. Symptoms began 3 weeks ago with pain in the upper right back and steadily worsened with pain extending down to the right shoulder arm and 4. Patient was treated with prednisone Dosepak hydrocodone muscle relaxants tramadol and Neurontin none of these gave her much in the way of relief other than for the tramadol but that caused some itching. Patient underwent chiropractic treatments without relief. Patient was studied with MRI scan and x-rays which revealed cervical disc herniations superimposed upon spondylosis and degenerative disc disease causing significant nerve root compression corresponding to a radiculopathy.    Patient Active Problem List  Diagnoses Date Noted  . Cervical disc disorder with radiculopathy of cervical region 04/17/2011  . Endometriosis   . Recurrent vaginitis   . Osteoporosis    Past Medical History  Diagnosis Date  . Arthritis   . GERD (gastroesophageal reflux disease)   . Scoliosis   . Migraine   . Allergy   . Night sweats   . Fatigue   . Wears glasses   . Reflux   . Endometriosis   . Recurrent vaginitis   . Osteoporosis   . IBS (irritable bowel syndrome)   . PONV (postoperative nausea and vomiting)     also difficult to wake up    Past Surgical History  Procedure Date  . Rotator cuff repair     right 2002 left 2000  . Hernia repair 08/02/1995    [facility]  . Laparoscopic endometriosis fulguration 1997  . Pelvic laparoscopy   . Cystoscopy   . Appendectomy 1978    Prescriptions prior to admission  Medication Sig Dispense Refill  . calcium-vitamin D (OSCAL) 250-125 MG-UNIT per tablet Take 1 tablet by mouth daily.        . Cholecalciferol (VITAMIN D PO) Take by mouth.        . divalproex (DEPAKOTE)  500 MG 24 hr tablet Take 500 mg by mouth daily.        Marland Kitchen estradiol (ESTRACE) 0.5 MG tablet Take 0.5 mg by mouth daily.        . fish oil-omega-3 fatty acids 1000 MG capsule Take 2 g by mouth daily.        Marland Kitchen gabapentin (NEURONTIN) 300 MG capsule Take 600 mg by mouth at bedtime.        Marland Kitchen HYDROcodone-acetaminophen (NORCO) 5-325 MG per tablet Take 1 tablet by mouth every 6 (six) hours as needed. For pain        . medroxyPROGESTERone (PROVERA) 2.5 MG tablet Take 2.5 mg by mouth daily.        . Multiple Vitamins-Minerals (MULTIVITAMIN WITH MINERALS) tablet Take 1 tablet by mouth daily.        . nefazodone (SERZONE) 50 MG tablet Take 75 mg by mouth daily.        Marland Kitchen OVER THE COUNTER MEDICATION 4 tablets by Per post-pyloric tube route daily. ISOCORT       . OVER THE COUNTER MEDICATION Take 1 tablet by mouth daily. excitaplus       . Probiotic Product (PROBIOTIC FORMULA PO) Take by mouth daily.         Allergies  Allergen Reactions  . Darvocet (Propoxyphene N-Acetaminophen)   . Penicillins   . Percocet (Oxycodone-Acetaminophen)   . Vicodin (Hydrocodone-Acetaminophen)   .  Nortriptyline Itching  . Tramadol Itching    History  Substance Use Topics  . Smoking status: Former Smoker    Quit date: 05/17/1983  . Smokeless tobacco: Never Used  . Alcohol Use: 0.5 oz/week    1 drink(s) per week     socially, occasional    Family History  Problem Relation Age of Onset  . Cancer Father     lymphoma  . Other Mother     bipolar,reflux  . Bipolar disorder Mother   . Other Brother     sinus problems  . Cancer Maternal Aunt     uterine cancer  . Breast cancer Maternal Aunt   . Diabetes Maternal Aunt   . Cancer Paternal Aunt     Colon cancer     Review of Systems A comprehensive review of systems was negative.  Objective: Vital signs in last 24 hours: Temp:  [98.6 F (37 C)] 98.6 F (37 C) (12/03 0616) Pulse Rate:  [72] 72  (12/03 0616) Resp:  [18] 18  (12/03 0616) BP: (103)/(67) 103/67  mmHg (12/03 0616) SpO2:  [100 %] 100 % (12/03 0616) Weight:  [63.05 kg (139 lb)] 139 lb (63.05 kg) (12/03 0454)  EXAM: Physical examination shows a well-developed well-nourished female in no acute distress but in discomfort. Lungs clear to auscultation she is symmetrical respiratory excursion heart has a regular rate and rhythm normal S1 and S2 there is no murmur abdomen soft nontender and nondistended bowel sounds are present. Extremity examination shows no clubbing cyanosis or edema muscular skeletal examination shows no tenderness to palpation over the cervical spinous processes or paracervical musculature she has a full range of motion neck flexion extension and lateral flexion to either side. Neurologic examination shows five over 5 strength in the deltoid and biceps bilaterally her the left triceps is 5 the right triceps is 4 feet and intrinsics and grip are 5 over 5 bilaterally. Sensation is intact to pinprick. Reflexes show diminished right triceps reflex without evidence of pathologic reflexes toes are downgoing.  Data Review:CBC    Component Value Date/Time   WBC 5.3 04/17/2011 0651   RBC 4.13 04/17/2011 0651   HGB 12.6 04/17/2011 0651   HCT 38.0 04/17/2011 0651   PLT 193 04/17/2011 0651   MCV 92.0 04/17/2011 0651   MCH 30.5 04/17/2011 0651   MCHC 33.2 04/17/2011 0651   RDW 12.6 04/17/2011 0651                          BMET No results found for this basename: na, k, cl, co2, glucose, bun, creatinine, calcium, gfrnonaa, gfraa     Assessment/Plan: Cervical radiculopathy and neck pain secondary to cervical disc herniation superimposed upon cervical spondylosis and degenerative disease at the C5-6 and C6-7 levels. Patient is brought to surgery for 2 level CV-6 and C6-7 anterior cervical decompression and arthrodesis. I discussed the nature of her condition the nature of surgery alternatives to surgery and risks of surgery with the patient. Understanding the alternatives and risks the patient  wished to proceed with surgery and is admitted for such.   Hewitt Shorts, MD 04/17/2011 7:39 AM

## 2011-04-17 NOTE — Preoperative (Signed)
Beta Blockers   Reason not to administer Beta Blockers:Not Applicable 

## 2011-04-17 NOTE — Op Note (Signed)
Patient was brought to the operating room placed under general endotracheal anesthesia. Patient was placed in 10 pounds of halter traction. The neck was prepped with Betadine soap and solution and draped in a sterile fashion. A horizontal incision was made on the left side of the neck. The line of the incision was infiltrated with local anesthetic with epinephrine. Dissection was carried down thru the subcutaneous tissue and platysma, bipolar cautery was used to maintain hemostasis. Dissection was then carried out thru an avascular plane leaving the sternocleidomastoid carotid artery and jugular vein laterally and the trachea and esophagus medially. The ventral aspect of the vertebral column was identified and a localizing x-ray was taken. The C5-6 and C6-7 levels were identified. The annulus at each level was incised and the disc space entered. Discectomy was performed with micro-curettes and pituitary rongeurs. The operating microscope was draped and brought into the field provided additional magnification illumination and visualization. Discectomy was continued posteriorly thru the disc space and then the cartilaginous endplate was removed using micro-curettes along with the high-speed drill. Posterior osteophytic overgrowth was removed each level using the high-speed drill along with a 2 mm thin footplated Kerrison punch. Posterior longitudinal ligament along with disc herniation was carefully removed, decompressing the spinal canal and thecal sac. We then continued to remove osteophytic overgrowth and disc material decompressing the neural foramina and exiting nerve roots bilaterally. Once the decompression was completed hemostasis was established at each level with the use of Gelfoam with thrombin and bipolar cautery. The Gelfoam was removed the wound irrigated and hemostasis confirmed. We then measured the height of the intravertebral disc space level and selected a  6 millimeter in height structural  allograft for the C5-6 level and a 6 millimeter in height structural allograft for the C6-7 level . Each was hydrated and saline solution and then gently positioned in the intravertebral disc space and countersunk. We then selected a 32 millimeter in height and Tether cervical plate. It was positioned over the fusion construct and secured to the vertebra with 4 x 13 mm screws. Each screw hole was started with the high-speed drill and then the screws placed, once all the screws were placed final tightening was performed. The wound was irrigated with bacitracin solution checked for hemostasis which was established and confirmed. An x-ray was taken which showed grafts in good position the plate in good position and the overall alignment to be good. We then proceeded with closure. The platysma was closed with interrupted inverted 2-0 undyed Vicryl suture, the subcutaneous and subcuticular closed with interrupted inverted 3-0 undyed Vicryl suture. The skin edges were approximated with Dermabond. Following surgery the patient was taken out of cervical traction. To be reversed and the anesthetic and taken to the recovery room for further care.

## 2011-04-17 NOTE — Anesthesia Postprocedure Evaluation (Signed)
  Anesthesia Post-op Note  Patient: Yolanda Carroll  Procedure(s) Performed:  ANTERIOR CERVICAL DECOMPRESSION/DISCECTOMY FUSION 2 LEVELS - Cervical five-six, six-seven anterior cervical decompression with fusion,  plating,  and bonegraft   Patient Location: PACU  Anesthesia Type: General  Level of Consciousness: awake  Airway and Oxygen Therapy: Patient Spontanous Breathing  Post-op Pain: mild  Post-op Assessment: Post-op Vital signs reviewed  Post-op Vital Signs: stable  Complications: No apparent anesthesia complications

## 2011-04-18 MED ORDER — HYDROMORPHONE HCL 2 MG PO TABS: 2.0000 mg | ORAL_TABLET | ORAL | Status: AC | PRN

## 2011-04-18 NOTE — Discharge Summary (Signed)
  Physician Discharge Summary  Patient ID: Yolanda Carroll MRN: [id number] DOB/AGE: Mar 17, 1956 55 y.o.  Admit date: 04/17/2011 Discharge date: 04/18/2011  Admission Diagnoses: Cervical disc herniation, cervical spondylosis, cervical degenerative disc disease, cervical radiculopathy.  Discharge Diagnoses: Cervical disc herniation, cervical spondylosis, cervical degenerative disc disease, cervical radiculopathy. Active Problems:  Cervical disc disorder with radiculopathy of cervical region   Discharged Condition: good  Hospital Course: Patient was admitted underwent a 2 level C5-6 and C6-7 anterior cervical discectomy and arthrodesis with allograft and cervical plating. She is done well following surgery she's been up and ambulating her strength in her right upper extremity is notably improved following surgery. Her incision is healing well and is clean and dry.  Consults: none  Significant Diagnostic Studies: None  Discharge Exam: Blood pressure 98/40, pulse 89, temperature 98.6 F (37 C), temperature source Oral, resp. rate 16, weight 63.05 kg (139 lb), SpO2 97.00%. Right triceps is 4+ to 5 over 5 wound clean and dry.  Disposition: Home. Patient is to followup with me in the office in 3 weeks or sooner if she has increased difficulties. She's been given instructions regarding wound care and activities following discharge.   Current Discharge Medication List    START taking these medications   Details  HYDROmorphone (DILAUDID) 2 MG tablet Take 1-2 tablets (2-4 mg total) by mouth every 4 (four) hours as needed. Qty: 30 tablet, Refills: 0      CONTINUE these medications which have NOT CHANGED   Details  calcium-vitamin D (OSCAL) 250-125 MG-UNIT per tablet Take 1 tablet by mouth daily.      Cholecalciferol (VITAMIN D PO) Take by mouth.      divalproex (DEPAKOTE) 500 MG 24 hr tablet Take 500 mg by mouth daily.      estradiol (ESTRACE) 0.5 MG tablet Take 0.5 mg by mouth daily.       fish oil-omega-3 fatty acids 1000 MG capsule Take 2 g by mouth daily.      HYDROcodone-acetaminophen (NORCO) 5-325 MG per tablet Take 1 tablet by mouth every 6 (six) hours as needed. For pain      medroxyPROGESTERone (PROVERA) 2.5 MG tablet Take 2.5 mg by mouth daily.      Multiple Vitamins-Minerals (MULTIVITAMIN WITH MINERALS) tablet Take 1 tablet by mouth daily.      nefazodone (SERZONE) 50 MG tablet Take 75 mg by mouth daily.      !! OVER THE COUNTER MEDICATION 4 tablets by Per post-pyloric tube route daily. ISOCORT     !! OVER THE COUNTER MEDICATION Take 1 tablet by mouth daily. excitaplus     Probiotic Product (PROBIOTIC FORMULA PO) Take by mouth daily.       !! - Potential duplicate medications found. Please discuss with provider.    STOP taking these medications     gabapentin (NEURONTIN) 300 MG capsule          Signed: Hewitt Shorts, MD 04/18/2011, 7:48 AM

## 2011-04-19 ENCOUNTER — Encounter (HOSPITAL_COMMUNITY): Payer: Self-pay | Admitting: Neurosurgery

## 2011-04-19 NOTE — Progress Notes (Signed)
04/17/2011  7:42 AM  PATIENT:  Yolanda Carroll  55 y.o. female  PRE-OPERATIVE DIAGNOSIS:  Cervical five-six and six seven,cervical herniated disc cervical degenerative disc disease cervical spondylosis cervical radiculopathy  POST-OPERATIVE DIAGNOSIS:  Cervical five-six and six seven,cervical herniated disc cervical degenerative disc disease cervical spondylosis cervical radiculopathy  PROCEDURE:  Procedure(s): ANTERIOR CERVICAL DECOMPRESSION/DISCECTOMY FUSION 2 LEVELS  SURGEON:  Surgeon(s): Jennelle Human  ASSISTANTS: Cristi Loron  ANESTHESIA:   general  EBL:  Total I/O In: 2000 [P.O.:600; I.V.:1400] Out: 150 [Blood:150]  BLOOD ADMINISTERED:none  CELL SAVER GIVEN: None  COUNT: Correct  DRAINS: none   SPECIMEN:  No Specimen  DICTATION:  Patient was brought to the operating room placed under general endotracheal anesthesia. Patient was placed in 10 pounds of halter traction. The neck was prepped with Betadine soap and solution and draped in a sterile fashion. A horizontal incision was made on the left side of the neck. The line of the incision was infiltrated with local anesthetic with epinephrine. Dissection was carried down thru the subcutaneous tissue and platysma, bipolar cautery was used to maintain hemostasis. Dissection was then carried out thru an avascular plane leaving the sternocleidomastoid carotid artery and jugular vein laterally and the trachea and esophagus medially. The ventral aspect of the vertebral column was identified and a localizing x-ray was taken. The C5-6 and C6-7 levels were identified. The annulus at each level was incised and the disc space entered. Discectomy was performed with micro-curettes and pituitary rongeurs. The operating microscope was draped and brought into the field provided additional magnification illumination and visualization. Discectomy was continued posteriorly thru the disc space and then the  cartilaginous endplate was removed using micro-curettes along with the high-speed drill. Posterior osteophytic overgrowth was removed each level using the high-speed drill along with a 2 mm thin footplated Kerrison punch. Posterior longitudinal ligament along with disc herniation was carefully removed, decompressing the spinal canal and thecal sac. We then continued to remove osteophytic overgrowth and disc material decompressing the neural foramina and exiting nerve roots bilaterally. Once the decompression was completed hemostasis was established at each level with the use of Gelfoam with thrombin and bipolar cautery. The Gelfoam was removed the wound irrigated and hemostasis confirmed. We then measured the height of the intravertebral disc space level and selected a 6 millimeter in height structural allograft for the C5-6 level and a 6 millimeter in height structural allograft for the C6-7 level . Each was hydrated and saline solution and then gently positioned in the intravertebral disc space and countersunk. We then selected a 32 millimeter in height and Tether cervical plate. It was positioned over the fusion construct and secured to the vertebra with 4 x 13 mm screws. Each screw hole was started with the high-speed drill and then the screws placed, once all the screws were placed final tightening was performed. The wound was irrigated with bacitracin solution checked for hemostasis which was established and confirmed. An x-ray was taken which showed grafts in good position the plate in good position and the overall alignment to be good. We then proceeded with closure. The platysma was closed with interrupted inverted 2-0 undyed Vicryl suture, the subcutaneous and subcuticular closed with interrupted inverted 3-0 undyed Vicryl suture. The skin edges were approximated with Dermabond. Following surgery the patient was taken out of cervical traction. To be reversed and the anesthetic and taken to the recovery  room for further care.       PLAN OF  CARE: Admit for overnight observation  PATIENT DISPOSITION:  PACU - hemodynamically stable.   Delay start of Pharmacological VTE agent (>24hrs) due to surgical blood loss or risk of bleeding:  yes

## 2011-04-19 NOTE — Op Note (Signed)
PATIENT: Yolanda Carroll 55 y.o. female  PRE-OPERATIVE DIAGNOSIS: Cervical five-six and six seven,cervical herniated disc cervical degenerative disc disease cervical spondylosis cervical radiculopathy  POST-OPERATIVE DIAGNOSIS: Cervical five-six and six seven,cervical herniated disc cervical degenerative disc disease cervical spondylosis cervical radiculopathy  PROCEDURE: Procedure(s):  ANTERIOR CERVICAL DECOMPRESSION/DISCECTOMY FUSION 2 LEVELS  SURGEON: Surgeon(s):  Jennelle Human  ASSISTANTS: Cristi Loron  ANESTHESIA: general  EBL: Total I/O  In: 2000 [P.O.:600; I.V.:1400]  Out: 150 [Blood:150]  BLOOD ADMINISTERED:none  CELL SAVER GIVEN: None  COUNT: Correct  DRAINS: none  SPECIMEN: No Specimen  DICTATION:  Patient was brought to the operating room placed under general endotracheal anesthesia. Patient was placed in 10 pounds of halter traction. The neck was prepped with Betadine soap and solution and draped in a sterile fashion. A horizontal incision was made on the left side of the neck. The line of the incision was infiltrated with local anesthetic with epinephrine. Dissection was carried down thru the subcutaneous tissue and platysma, bipolar cautery was used to maintain hemostasis. Dissection was then carried out thru an avascular plane leaving the sternocleidomastoid carotid artery and jugular vein laterally and the trachea and esophagus medially. The ventral aspect of the vertebral column was identified and a localizing x-ray was taken. The C5-6 and C6-7 levels were identified. The annulus at each level was incised and the disc space entered. Discectomy was performed with micro-curettes and pituitary rongeurs. The operating microscope was draped and brought into the field provided additional magnification illumination and visualization. Discectomy was continued posteriorly thru the disc space and then the cartilaginous endplate was removed using  micro-curettes along with the high-speed drill. Posterior osteophytic overgrowth was removed each level using the high-speed drill along with a 2 mm thin footplated Kerrison punch. Posterior longitudinal ligament along with disc herniation was carefully removed, decompressing the spinal canal and thecal sac. We then continued to remove osteophytic overgrowth and disc material decompressing the neural foramina and exiting nerve roots bilaterally. Once the decompression was completed hemostasis was established at each level with the use of Gelfoam with thrombin and bipolar cautery. The Gelfoam was removed the wound irrigated and hemostasis confirmed. We then measured the height of the intravertebral disc space level and selected a 6 millimeter in height structural allograft for the C5-6 level and a 6 millimeter in height structural allograft for the C6-7 level . Each was hydrated and saline solution and then gently positioned in the intravertebral disc space and countersunk. We then selected a 32 millimeter in height and Tether cervical plate. It was positioned over the fusion construct and secured to the vertebra with 4 x 13 mm screws. Each screw hole was started with the high-speed drill and then the screws placed, once all the screws were placed final tightening was performed. The wound was irrigated with bacitracin solution checked for hemostasis which was established and confirmed. An x-ray was taken which showed grafts in good position the plate in good position and the overall alignment to be good. We then proceeded with closure. The platysma was closed with interrupted inverted 2-0 undyed Vicryl suture, the subcutaneous and subcuticular closed with interrupted inverted 3-0 undyed Vicryl suture. The skin edges were approximated with Dermabond. Following surgery the patient was taken out of cervical traction. To be reversed and the anesthetic and taken to the recovery room for further care.     PLAN OF CARE:  Admit for overnight observation  PATIENT DISPOSITION: PACU - hemodynamically stable.  Delay start of Pharmacological VTE agent (>24hrs) due to surgical blood loss or risk of bleeding: yes

## 2011-05-16 HISTORY — PX: BREAST EXCISIONAL BIOPSY: SUR124

## 2011-05-25 ENCOUNTER — Other Ambulatory Visit: Payer: Self-pay | Admitting: *Deleted

## 2011-05-25 ENCOUNTER — Telehealth: Payer: Self-pay | Admitting: *Deleted

## 2011-05-25 DIAGNOSIS — N644 Mastodynia: Secondary | ICD-10-CM

## 2011-05-25 NOTE — Telephone Encounter (Signed)
Diagnoses is focal tenderness in the upper ourter quadrant of right breast. Schedule diagnostic mammogram and ultrasound of right breast.

## 2011-05-25 NOTE — Telephone Encounter (Signed)
Lm for patient to call.  Have appt set up with [facility] on 06/05/11 @ 9:20.

## 2011-05-25 NOTE — Telephone Encounter (Signed)
Patient called c/o same right breast issue that she was having in November.  Was told we could scheduled a diagnostic with the Breast Center.  Please advise on what order to put in.

## 2011-05-29 NOTE — Telephone Encounter (Signed)
Patient informed. 

## 2011-06-05 ENCOUNTER — Ambulatory Visit
Admission: RE | Admit: 2011-06-05 | Discharge: 2011-06-05 | Disposition: A | Discharge status: Home / Self Care | Payer: 59 | Source: Ambulatory Visit | Attending: Obstetrics and Gynecology | Admitting: Obstetrics and Gynecology

## 2011-06-05 ENCOUNTER — Other Ambulatory Visit: Payer: Self-pay | Admitting: Obstetrics and Gynecology

## 2011-06-05 DIAGNOSIS — N644 Mastodynia: Secondary | ICD-10-CM

## 2011-06-05 DIAGNOSIS — N6310 Unspecified lump in the right breast, unspecified quadrant: Secondary | ICD-10-CM

## 2011-06-13 ENCOUNTER — Ambulatory Visit
Admission: RE | Admit: 2011-06-13 | Discharge: 2011-06-13 | Disposition: A | Discharge status: Home / Self Care | Payer: 59 | Source: Ambulatory Visit | Attending: Obstetrics and Gynecology | Admitting: Obstetrics and Gynecology

## 2011-06-13 ENCOUNTER — Other Ambulatory Visit: Payer: Self-pay | Admitting: Obstetrics and Gynecology

## 2011-06-13 ENCOUNTER — Other Ambulatory Visit: Payer: Self-pay | Admitting: Radiology

## 2011-06-13 DIAGNOSIS — N6310 Unspecified lump in the right breast, unspecified quadrant: Secondary | ICD-10-CM

## 2011-06-15 ENCOUNTER — Ambulatory Visit (INDEPENDENT_AMBULATORY_CARE_PROVIDER_SITE_OTHER): Payer: 59 | Admitting: General Surgery

## 2011-06-29 ENCOUNTER — Other Ambulatory Visit (HOSPITAL_COMMUNITY)
Admission: RE | Admit: 2011-06-29 | Discharge: 2011-06-29 | Disposition: A | Discharge status: Home / Self Care | Payer: 59 | Source: Ambulatory Visit | Attending: Obstetrics and Gynecology | Admitting: Obstetrics and Gynecology

## 2011-06-29 ENCOUNTER — Encounter: Payer: Self-pay | Admitting: Obstetrics and Gynecology

## 2011-06-29 ENCOUNTER — Ambulatory Visit (INDEPENDENT_AMBULATORY_CARE_PROVIDER_SITE_OTHER): Payer: 59 | Admitting: Obstetrics and Gynecology

## 2011-06-29 VITALS — BP 100/62 | Ht 67.0 in | Wt 135.0 lb

## 2011-06-29 DIAGNOSIS — Z01419 Encounter for gynecological examination (general) (routine) without abnormal findings: Secondary | ICD-10-CM | POA: Insufficient documentation

## 2011-06-29 DIAGNOSIS — N6009 Solitary cyst of unspecified breast: Secondary | ICD-10-CM | POA: Insufficient documentation

## 2011-06-29 LAB — URINALYSIS W MICROSCOPIC + REFLEX CULTURE
Bilirubin Urine: NEGATIVE
Glucose, UA: NEGATIVE mg/dL
Hgb urine dipstick: NEGATIVE
Ketones, ur: NEGATIVE mg/dL
Leukocytes, UA: NEGATIVE
Nitrite: NEGATIVE
Protein, ur: NEGATIVE mg/dL
Specific Gravity, Urine: 1.015 (ref 1.005–1.030)
Urobilinogen, UA: 0.2 mg/dL (ref 0.0–1.0)
pH: 7 (ref 5.0–8.0)

## 2011-06-29 MED ORDER — ESTRADIOL 0.5 MG PO TABS: 0.5000 mg | ORAL_TABLET | Freq: Every day | ORAL | Status: DC

## 2011-06-29 MED ORDER — MEDROXYPROGESTERONE ACETATE 2.5 MG PO TABS: 2.5000 mg | ORAL_TABLET | Freq: Every day | ORAL | Status: DC

## 2011-06-29 NOTE — Progress Notes (Signed)
Patient came to see me today for her annual GYN exam. She is doing well on HRT with relief of symptoms and no cycle. She still is having some vaginal dryness in spite of it. She will also occasionally gets some intermittent mastodynia. On a recent mammogram she had a suspicious lesion in her right breast. Biopsy showed fibrosis and inflammation with microcalcification. There was no malignancy. Dr. Jean Rosenthal however felt it was discordant and she has an appointment with the surgeon tomorrow to have the area surgically excised. She is having no pelvic pain. She had a bone density at her office which showed low bone mass but with improvement over her past bone density. She is currently on drug holiday.  Physical examination: Yolanda Carroll present. HEENT within normal limits. Neck: Thyroid not large. No masses. Supraclavicular nodes: not enlarged. Breasts: Examined in both sitting midline position. No skin changes and no masses. Abdomen: Soft no guarding rebound or masses or hernia. Pelvic: External: Within normal limits. BUS: Within normal limits. Vaginal:within normal limits. Good estrogen effect. No evidence of cystocele rectocele or enterocele. Cervix: clean. Uterus: Normal size and shape. Adnexa: No masses. Rectovaginal exam: Confirmatory and negative. Extremities: Within normal limits.  Assessment: #1. Mastodynia #2. Menopausal symptoms #3. Abnormal mammogram  Plan: For the moment she will continue her HRT pending surgical biopsy. We aided Vagifem today. If that doesn't help she will call and we will give her estradiol vaginal cream 0.02% from [facility]. We discussed stopping HRT for 3 days a month for the mastodynia. She will continue periodic bone densities.

## 2011-06-30 ENCOUNTER — Ambulatory Visit (INDEPENDENT_AMBULATORY_CARE_PROVIDER_SITE_OTHER): Payer: 59 | Admitting: General Surgery

## 2011-06-30 ENCOUNTER — Other Ambulatory Visit (INDEPENDENT_AMBULATORY_CARE_PROVIDER_SITE_OTHER): Payer: Self-pay | Admitting: General Surgery

## 2011-06-30 ENCOUNTER — Encounter (INDEPENDENT_AMBULATORY_CARE_PROVIDER_SITE_OTHER): Payer: Self-pay | Admitting: General Surgery

## 2011-06-30 VITALS — BP 94/68 | HR 76 | Temp 98.4°F | Resp 16 | Ht 67.5 in | Wt 132.4 lb

## 2011-06-30 DIAGNOSIS — R920 Mammographic microcalcification found on diagnostic imaging of breast: Secondary | ICD-10-CM

## 2011-06-30 NOTE — Progress Notes (Signed)
Patient ID: Yolanda Carroll, female   DOB: [birth year], 56 y.o.   MRN: [id number]  Chief Complaint  Patient presents with  . Follow-up    rt br bx mass    HPI Yolanda Carroll is a 56 y.o. female.  Referred by Dr. Anselmo Pickler HPI [phone] who presents after having right breast tenderness.  She then underwent mmg which showed upper inner right breast mass on mmg and [place].This is about 1.5 - 1.7 cm in size.  This was biopsied and is benign.  This appears discordant from appearance and she is referred for consideration of excision.  She has some tenderness on right that is not near the mass but no other complaints referable to her breasts.  Past Medical History  Diagnosis Date  . Arthritis   . GERD (gastroesophageal reflux disease)   . Scoliosis   . Migraine   . Allergy   . Night sweats   . Fatigue   . Wears glasses   . Reflux   . Endometriosis   . Recurrent vaginitis   . Osteoporosis   . IBS (irritable bowel syndrome)   . PONV (postoperative nausea and vomiting)     also difficult to wake up  . Cervical dysplasia   . Breast cyst     Past Surgical History  Procedure Date  . Rotator cuff repair     right 2002 left 2000  . Hernia repair 08/02/1995    [facility]  . Laparoscopic endometriosis fulguration 1997  . Pelvic laparoscopy   . Cystoscopy   . Appendectomy 1978  . Anterior cervical decomp/discectomy fusion 04/17/2011    Procedure: ANTERIOR CERVICAL DECOMPRESSION/DISCECTOMY FUSION 2 LEVELS;  Surgeon: Hewitt Shorts;  Location: [facility];  Service: Neurosurgery;  Laterality: N/A;  Cervical five-six, six-seven anterior cervical decompression with fusion,  plating,  and bonegraft   . Colposcopy   . Gynecologic cryosurgery   . Breast surgery     Breast Bx-Benign  . Breast biopsy 06/2011    right breast    Family History  Problem Relation Age of Onset  . Cancer Father     lymphoma  . Other Mother     bipolar,reflux  . Bipolar disorder Mother   . Other Brother    sinus problems  . Cancer Maternal Aunt     uterine cancer  . Breast cancer Maternal Aunt   . Diabetes Maternal Aunt   . Cancer Paternal Aunt     Colon cancer    Social History History  Substance Use Topics  . Smoking status: Former Smoker    Quit date: 05/17/1983  . Smokeless tobacco: Never Used  . Alcohol Use: 0.5 oz/week    1 drink(s) per week     socially, occasional    Allergies  Allergen Reactions  . Penicillins   . Nortriptyline Itching  . Tramadol Itching  . Darvocet (Propoxyphene N-Acetaminophen) Itching  . Percocet (Oxycodone-Acetaminophen) Itching    Current Outpatient Prescriptions  Medication Sig Dispense Refill  . azithromycin (ZITHROMAX) 500 MG tablet Take 500 mg by mouth daily.      . calcium-vitamin D (OSCAL) 250-125 MG-UNIT per tablet Take 1 tablet by mouth daily.        . Cholecalciferol (VITAMIN D PO) Take by mouth.        . divalproex (DEPAKOTE) 500 MG 24 hr tablet Take 500 mg by mouth daily.        Marland Kitchen estradiol (ESTRACE) 0.5 MG tablet Take 1 tablet (0.5 mg  total) by mouth daily.  30 tablet  12  . fish oil-omega-3 fatty acids 1000 MG capsule Take 2 g by mouth daily.        Marland Kitchen HYDROcodone-acetaminophen (NORCO) 5-325 MG per tablet Take 1 tablet by mouth every 6 (six) hours as needed. For pain        . medroxyPROGESTERone (PROVERA) 2.5 MG tablet Take 1 tablet (2.5 mg total) by mouth daily.  30 tablet  12  . Multiple Vitamins-Minerals (MULTIVITAMIN WITH MINERALS) tablet Take 1 tablet by mouth daily.        Marland Kitchen OVER THE COUNTER MEDICATION 4 tablets by Per post-pyloric tube route daily. ISOCORT       . OVER THE COUNTER MEDICATION Take 1 tablet by mouth daily. excitaplus       . Probiotic Product (PROBIOTIC FORMULA PO) Take by mouth daily.          Review of Systems Review of Systems  Constitutional: Negative for fever, chills and unexpected weight change.  HENT: Negative for hearing loss, congestion, sore throat, trouble swallowing and voice change.   Eyes:  Negative for visual disturbance.  Respiratory: Negative for cough and wheezing.   Cardiovascular: Negative for chest pain, palpitations and leg swelling.  Gastrointestinal: Negative for nausea, vomiting, abdominal pain, diarrhea, constipation, blood in stool, abdominal distention and anal bleeding.  Genitourinary: Negative for hematuria, vaginal bleeding and difficulty urinating.  Musculoskeletal: Negative for arthralgias.  Skin: Negative for rash and wound.  Neurological: Negative for seizures, syncope and headaches.  Hematological: Negative for adenopathy. Does not bruise/bleed easily.  Psychiatric/Behavioral: Negative for confusion.    Blood pressure 94/68, pulse 76, temperature 98.4 F (36.9 C), temperature source Temporal, resp. rate 16, height 5' 7.5" (1.715 m), weight 132 lb 6.4 oz (60.056 kg).  Physical Exam Physical Exam  Vitals reviewed. Constitutional: She appears well-developed and well-nourished.  Neck: Neck supple.  Cardiovascular: Normal rate, regular rhythm and normal heart sounds.   Pulmonary/Chest: Effort normal and breath sounds normal. She has no wheezes. She has no rales. Right breast exhibits no inverted nipple, no mass, no nipple discharge, no skin change and no tenderness. Left breast exhibits no inverted nipple, no mass, no nipple discharge, no skin change and no tenderness. Breasts are symmetrical.  Lymphadenopathy:    She has no cervical adenopathy.    Data Reviewed The pathology demonstrated fibrosis and inflammation with  microcalcifications present. There are ectatic ducts. When  correlating the extent of change on mammography with the pathology,  this is felt to be discordant. As a result, surgical consultation  for possible surgical excisional biopsy is recommended. The clip  is located within the central portion of the lesion on the post  biopsy mammogram. The patient has been scheduled to see Dr.  Dwain Sarna on 06/15/2011. However, the patient states  that she may  need to reschedule this appointment. Post biopsy wound care  instructions were reviewed with the patient. The patient states  her biopsy site is clean and dry without hematoma formation. The  patient was encouraged to call the Breast Center for additional  questions or concerns.  Addended by: Rolla Plate, M.D. on 06/14/2011 13:27:42.  **END ADDENDUM** SIGNED BY: Rolla Plate, M.D.       Study Result     *RADIOLOGY REPORT*  Clinical Data: Right breast mass.  ULTRASOUND GUIDED CORE BIOPSY OF THE right BREAST  Comparison: Prior studies  I met with the patient and we discussed the procedure of ultrasound-  guided biopsy, including  benefits and alternatives. We discussed  the high likelihood of a successful procedure. We discussed the  risks of the procedure, including infection, bleeding, tissue  injury, clip migration, and inadequate sampling. Informed written  consent was given.  Using sterile technique 2% lidocaine, ultrasound guidance and a 14  gauge automated biopsy device, biopsy was performed of the mass  with calcification located within the right breast at the 1 o'clock  position. At the conclusion of the procedure a ribbon shaped  tissue marker clip was deployed into the biopsy cavity. Follow up  2 view mammogram was performed and dictated separately.  IMPRESSION:  Ultrasound guided biopsy of the right breast mass located at the 1  o'clock position as discussed above. No apparent complications.      Assessment    Right breast mass with discordant core biopsy    Plan    We discussed indication for excision.  We discussed right breast wire localization biopsy to rule out cancer. Risks include but not limited to bleeding, hematoma, infection, possible need for further surgery if cancer is found.  She is agreeable to proceed.  We discussed postoperative restrictions and recover.       Yolanda Carroll 06/30/2011, 12:01 PM

## 2011-06-30 NOTE — Patient Instructions (Signed)
[facility] °Office Phone Number [phone] ° °BREAST BIOPSY/ PARTIAL MASTECTOMY: POST OP INSTRUCTIONS ° °Always review your discharge instruction sheet given to you by the facility where your surgery was performed. ° °IF YOU HAVE DISABILITY OR FAMILY LEAVE FORMS, YOU MUST BRING THEM TO THE OFFICE FOR PROCESSING.  DO NOT GIVE THEM TO YOUR DOCTOR. ° °1. A prescription for pain medication may be given to you upon discharge.  Take your pain medication as prescribed, if needed.  If narcotic pain medicine is not needed, then you may take acetaminophen (Tylenol), naprosyn (Alleve) or ibuprofen (Advil) as needed. °2. Take your usually prescribed medications unless otherwise directed °3. If you need a refill on your pain medication, please contact your pharmacy.  They will contact our office to request authorization.  Prescriptions will not be filled after 5pm or on week-ends. °4. You should eat very light the first 24 hours after surgery, such as soup, crackers, pudding, etc.  Resume your normal diet the day after surgery. °5. Most patients will experience some swelling and bruising in the breast.  Ice packs and a good support bra will help.  Wear the breast binder provided or a sports bra for 72 hours day and night.  After that wear a sports bra during the day until you return to the office. Swelling and bruising can take several days to resolve.  °6. It is common to experience some constipation if taking pain medication after surgery.  Increasing fluid intake and taking a stool softener will usually help or prevent this problem from occurring.  A mild laxative (Milk of Magnesia or Miralax) should be taken according to package directions if there are no bowel movements after 48 hours. °7. Unless discharge instructions indicate otherwise, you may remove your bandages 48 hours after surgery and you may shower at that time.  You may have steri-strips (small skin tapes) in place directly over the incision.   These strips should be left on the skin for 7-10 days and will come off on their own.  If your surgeon used skin glue on the incision, you may shower in 24 hours.  The glue will flake off over the next 2-3 weeks.  Any sutures or staples will be removed at the office during your follow-up visit. °8. ACTIVITIES:  You may resume regular daily activities (gradually increasing) beginning the next day.  Wearing a good support bra or sports bra minimizes pain and swelling.  You may have sexual intercourse when it is comfortable. °a. You may drive when you no longer are taking prescription pain medication, you can comfortably wear a seatbelt, and you can safely maneuver your car and apply brakes. °b. RETURN TO WORK:  ______________________________________________________________________________________ °9. You should see your doctor in the office for a follow-up appointment approximately two weeks after your surgery.  Your doctor’s nurse will typically make your follow-up appointment when she calls you with your pathology report.  Expect your pathology report 3-4 business days after your surgery.  You may call to check if you do not hear from us after three days. °10. OTHER INSTRUCTIONS: _______________________________________________________________________________________________ _____________________________________________________________________________________________________________________________________ °_____________________________________________________________________________________________________________________________________ °_____________________________________________________________________________________________________________________________________ ° °WHEN TO CALL DR Arzu Mcgaughey: °1. Fever over 101.0 °2. Nausea and/or vomiting. °3. Extreme swelling or bruising. °4. Continued bleeding from incision. °5. Increased pain, redness, or drainage from the incision. ° °The clinic staff is available to  answer your questions during regular business hours.  Please don’t hesitate to call and ask to speak to one of the nurses for   clinical concerns.  If you have a medical emergency, go to the nearest emergency room or call 911.  A surgeon from [facility] is always on call at the hospital. ° °For further questions, please visit [email or web address] ° °

## 2011-07-04 ENCOUNTER — Telehealth (INDEPENDENT_AMBULATORY_CARE_PROVIDER_SITE_OTHER): Payer: Self-pay

## 2011-07-04 NOTE — Telephone Encounter (Signed)
Returned [employer]. The pt was wanting to make sure that it was ok that DR Jean Rosenthal was not assigned to do her needle loc for the day of her surgery 07-13-11. I spoke to Dr Dwain Sarna and he said he was not requesting Dr Jean Rosenthal that any of the radiologist are fine to do this procedure. At this time there is not anything that Dr Dwain Sarna has to discuss with Dr Jean Rosenthal about the pt.

## 2011-07-05 ENCOUNTER — Telehealth: Payer: Self-pay | Admitting: *Deleted

## 2011-07-05 ENCOUNTER — Telehealth (INDEPENDENT_AMBULATORY_CARE_PROVIDER_SITE_OTHER): Payer: Self-pay

## 2011-07-05 MED ORDER — ESTRADIOL 1 MG PO TABS: 1.0000 mg | ORAL_TABLET | Freq: Every day | ORAL | Status: DC

## 2011-07-05 NOTE — Telephone Encounter (Signed)
Pt informed with the below, rx sent to pharmacy.

## 2011-07-05 NOTE — Telephone Encounter (Signed)
yes

## 2011-07-05 NOTE — Telephone Encounter (Signed)
Returned pt's call.

## 2011-07-05 NOTE — Telephone Encounter (Signed)
Pt takes estradiol 0.5 mg daily. rx last filled on OV 06/28/10 pt said that pharmacy is having a hard time getting 0.5 mg. They have 1 mg estradiol and pt can cut pill in half. Okay sent new rx for estradiol 1 mg and take 1/2 a pill?

## 2011-07-07 ENCOUNTER — Encounter (HOSPITAL_BASED_OUTPATIENT_CLINIC_OR_DEPARTMENT_OTHER): Payer: Self-pay | Admitting: *Deleted

## 2011-07-07 NOTE — Pre-Procedure Instructions (Signed)
PT CONCERNED ABOUT SCRATCHY THROAT FROM CERVICAL NECK FUSION SURGERY IN 04-2011. DISCUSSED WITH DR Gypsy Balsam AND HE WILL SPEAK WITH HER ON MOM 2-25 WHEN SHE COMES IN FOR LABS

## 2011-07-10 ENCOUNTER — Encounter (HOSPITAL_BASED_OUTPATIENT_CLINIC_OR_DEPARTMENT_OTHER)
Admission: RE | Admit: 2011-07-10 | Discharge: 2011-07-10 | Disposition: A | Discharge status: Home / Self Care | Payer: 59 | Source: Ambulatory Visit | Attending: General Surgery | Admitting: General Surgery

## 2011-07-10 ENCOUNTER — Ambulatory Visit (HOSPITAL_BASED_OUTPATIENT_CLINIC_OR_DEPARTMENT_OTHER): Admission: RE | Admit: 2011-07-10 | Payer: 59 | Source: Ambulatory Visit

## 2011-07-10 LAB — CBC
HCT: 40.8 % (ref 36.0–46.0)
Hemoglobin: 13.2 g/dL (ref 12.0–15.0)
MCH: 29.7 pg (ref 26.0–34.0)
MCHC: 32.4 g/dL (ref 30.0–36.0)
MCV: 91.7 fL (ref 78.0–100.0)
Platelets: 219 10*3/uL (ref 150–400)
RBC: 4.45 MIL/uL (ref 3.87–5.11)
RDW: 12.7 % (ref 11.5–15.5)
WBC: 4.2 10*3/uL (ref 4.0–10.5)

## 2011-07-10 LAB — BASIC METABOLIC PANEL
BUN: 10 mg/dL (ref 6–23)
CO2: 29 mEq/L (ref 19–32)
Calcium: 9.8 mg/dL (ref 8.4–10.5)
Chloride: 105 mEq/L (ref 96–112)
Creatinine, Ser: 0.73 mg/dL (ref 0.50–1.10)
GFR calc Af Amer: >90 mL/min (ref >90)
GFR calc non Af Amer: >90 mL/min (ref >90)
Glucose, Bld: 62 mg/dL — ABNORMAL LOW (ref 70–99)
Potassium: 4 mEq/L (ref 3.5–5.1)
Sodium: 143 mEq/L (ref 135–145)

## 2011-07-13 ENCOUNTER — Ambulatory Visit
Admission: RE | Admit: 2011-07-13 | Discharge: 2011-07-13 | Disposition: A | Discharge status: Home / Self Care | Payer: 59 | Source: Ambulatory Visit | Attending: General Surgery | Admitting: General Surgery

## 2011-07-13 ENCOUNTER — Other Ambulatory Visit: Payer: Self-pay

## 2011-07-13 ENCOUNTER — Encounter (HOSPITAL_BASED_OUTPATIENT_CLINIC_OR_DEPARTMENT_OTHER): Payer: Self-pay | Admitting: *Deleted

## 2011-07-13 ENCOUNTER — Ambulatory Visit (HOSPITAL_BASED_OUTPATIENT_CLINIC_OR_DEPARTMENT_OTHER): Payer: 59 | Admitting: *Deleted

## 2011-07-13 ENCOUNTER — Encounter (HOSPITAL_BASED_OUTPATIENT_CLINIC_OR_DEPARTMENT_OTHER)
Admission: RE | Disposition: A | Discharge status: Home / Self Care | Payer: Self-pay | Source: Ambulatory Visit | Attending: General Surgery

## 2011-07-13 ENCOUNTER — Ambulatory Visit (HOSPITAL_BASED_OUTPATIENT_CLINIC_OR_DEPARTMENT_OTHER)
Admission: RE | Admit: 2011-07-13 | Discharge: 2011-07-13 | Disposition: A | Discharge status: Home / Self Care | Payer: 59 | Source: Ambulatory Visit | Attending: General Surgery | Admitting: General Surgery

## 2011-07-13 DIAGNOSIS — R920 Mammographic microcalcification found on diagnostic imaging of breast: Secondary | ICD-10-CM

## 2011-07-13 DIAGNOSIS — K219 Gastro-esophageal reflux disease without esophagitis: Secondary | ICD-10-CM | POA: Insufficient documentation

## 2011-07-13 DIAGNOSIS — Z01812 Encounter for preprocedural laboratory examination: Secondary | ICD-10-CM | POA: Insufficient documentation

## 2011-07-13 DIAGNOSIS — R928 Other abnormal and inconclusive findings on diagnostic imaging of breast: Secondary | ICD-10-CM | POA: Insufficient documentation

## 2011-07-13 DIAGNOSIS — N6019 Diffuse cystic mastopathy of unspecified breast: Secondary | ICD-10-CM

## 2011-07-13 HISTORY — PX: BREAST BIOPSY: SHX20

## 2011-07-13 SURGERY — BREAST BIOPSY WITH NEEDLE LOCALIZATION
Anesthesia: General | Site: Breast | Laterality: Right | Wound class: Clean

## 2011-07-13 MED ORDER — MEPERIDINE HCL 25 MG/ML IJ SOLN: 6.2500 mg | INTRAMUSCULAR | Status: DC | PRN

## 2011-07-13 MED ORDER — FENTANYL CITRATE 0.05 MG/ML IJ SOLN
INTRAMUSCULAR | Status: DC | PRN
  Administered 2011-07-13: 100 ug via INTRAVENOUS

## 2011-07-13 MED ORDER — DEXAMETHASONE SODIUM PHOSPHATE 4 MG/ML IJ SOLN
INTRAMUSCULAR | Status: DC | PRN
  Administered 2011-07-13: 8 mg via INTRAVENOUS

## 2011-07-13 MED ORDER — LACTATED RINGERS IV SOLN
INTRAVENOUS | Status: DC
  Administered 2011-07-13 (×2): via INTRAVENOUS

## 2011-07-13 MED ORDER — HYDROMORPHONE HCL PF 1 MG/ML IJ SOLN
0.2500 mg | INTRAMUSCULAR | Status: DC | PRN
  Administered 2011-07-13 (×2): 0.5 mg via INTRAVENOUS

## 2011-07-13 MED ORDER — PROMETHAZINE HCL 25 MG/ML IJ SOLN: 6.2500 mg | INTRAMUSCULAR | Status: DC | PRN

## 2011-07-13 MED ORDER — SCOPOLAMINE 1 MG/3DAYS TD PT72
1.0000 | MEDICATED_PATCH | Freq: Once | TRANSDERMAL | Status: DC
  Administered 2011-07-13: 1.5 mg via TRANSDERMAL

## 2011-07-13 MED ORDER — BUPIVACAINE HCL (PF) 0.25 % IJ SOLN
INTRAMUSCULAR | Status: DC | PRN
  Administered 2011-07-13: 9 mL

## 2011-07-13 MED ORDER — LIDOCAINE HCL (CARDIAC) 20 MG/ML IV SOLN
INTRAVENOUS | Status: DC | PRN
  Administered 2011-07-13: 75 mg via INTRAVENOUS

## 2011-07-13 MED ORDER — PROPOFOL 10 MG/ML IV EMUL
INTRAVENOUS | Status: DC | PRN
  Administered 2011-07-13: 150 mg via INTRAVENOUS

## 2011-07-13 MED ORDER — ONDANSETRON HCL 4 MG/2ML IJ SOLN
INTRAMUSCULAR | Status: DC | PRN
  Administered 2011-07-13: 4 mg via INTRAVENOUS

## 2011-07-13 SURGICAL SUPPLY — 54 items
ADH SKN CLS APL DERMABOND .7 (GAUZE/BANDAGES/DRESSINGS)
APL SKNCLS STERI-STRIP NONHPOA (GAUZE/BANDAGES/DRESSINGS) ×1
APPLIER CLIP 9.375 MED OPEN (MISCELLANEOUS)
APR CLP MED 9.3 20 MLT OPN (MISCELLANEOUS)
BENZOIN TINCTURE PRP APPL 2/3 (GAUZE/BANDAGES/DRESSINGS) ×2 IMPLANT
BINDER BREAST LRG (GAUZE/BANDAGES/DRESSINGS) IMPLANT
BINDER BREAST MEDIUM (GAUZE/BANDAGES/DRESSINGS) IMPLANT
BINDER BREAST XLRG (GAUZE/BANDAGES/DRESSINGS) IMPLANT
BINDER BREAST XXLRG (GAUZE/BANDAGES/DRESSINGS) IMPLANT
BLADE SURG 15 STRL LF DISP TIS (BLADE) ×1 IMPLANT
BLADE SURG 15 STRL SS (BLADE) ×2
CANISTER SUCTION 1200CC (MISCELLANEOUS) ×1 IMPLANT
CHLORAPREP W/TINT 26ML (MISCELLANEOUS) ×2 IMPLANT
CLIP APPLIE 9.375 MED OPEN (MISCELLANEOUS) IMPLANT
CLOTH BEACON ORANGE TIMEOUT ST (SAFETY) ×2 IMPLANT
COVER MAYO STAND STRL (DRAPES) ×2 IMPLANT
COVER TABLE BACK 60X90 (DRAPES) ×2 IMPLANT
DECANTER SPIKE VIAL GLASS SM (MISCELLANEOUS) IMPLANT
DERMABOND ADVANCED (GAUZE/BANDAGES/DRESSINGS)
DERMABOND ADVANCED .7 DNX12 (GAUZE/BANDAGES/DRESSINGS) IMPLANT
DEVICE DUBIN W/COMP PLATE 8390 (MISCELLANEOUS) ×1 IMPLANT
DRAPE PED LAPAROTOMY (DRAPES) ×2 IMPLANT
DRSG TEGADERM 4X4.75 (GAUZE/BANDAGES/DRESSINGS) ×2 IMPLANT
ELECT COATED BLADE 2.86 ST (ELECTRODE) ×2 IMPLANT
ELECT REM PT RETURN 9FT ADLT (ELECTROSURGICAL) ×2
ELECTRODE REM PT RTRN 9FT ADLT (ELECTROSURGICAL) ×1 IMPLANT
GAUZE SPONGE 4X4 12PLY STRL LF (GAUZE/BANDAGES/DRESSINGS) ×2 IMPLANT
GLOVE BIO SURGEON STRL SZ7 (GLOVE) ×2 IMPLANT
GLOVE BIOGEL PI IND STRL 7.5 (GLOVE) ×1 IMPLANT
GLOVE BIOGEL PI INDICATOR 7.5 (GLOVE) ×1
GLOVE ECLIPSE 6.5 STRL STRAW (GLOVE) ×1 IMPLANT
GOWN PREVENTION PLUS XLARGE (GOWN DISPOSABLE) ×2 IMPLANT
NDL HYPO 25X1 1.5 SAFETY (NEEDLE) ×1 IMPLANT
NEEDLE HYPO 25X1 1.5 SAFETY (NEEDLE) ×2 IMPLANT
NS IRRIG 1000ML POUR BTL (IV SOLUTION) IMPLANT
PACK BASIN DAY SURGERY FS (CUSTOM PROCEDURE TRAY) ×2 IMPLANT
PENCIL BUTTON HOLSTER BLD 10FT (ELECTRODE) ×2 IMPLANT
SLEEVE SCD COMPRESS KNEE MED (MISCELLANEOUS) ×2 IMPLANT
SPONGE GAUZE 4X4 12PLY (GAUZE/BANDAGES/DRESSINGS) ×1 IMPLANT
SPONGE LAP 4X18 X RAY DECT (DISPOSABLE) ×2 IMPLANT
STRIP CLOSURE SKIN 1/2X4 (GAUZE/BANDAGES/DRESSINGS) ×2 IMPLANT
SUT MNCRL AB 4-0 PS2 18 (SUTURE) ×2 IMPLANT
SUT SILK 2 0 SH (SUTURE) ×2 IMPLANT
SUT VIC AB 2-0 SH 27 (SUTURE) ×2
SUT VIC AB 2-0 SH 27XBRD (SUTURE) ×1 IMPLANT
SUT VIC AB 3-0 SH 27 (SUTURE) ×2
SUT VIC AB 3-0 SH 27X BRD (SUTURE) ×1 IMPLANT
SUT VICRYL AB 3 0 TIES (SUTURE) IMPLANT
SYR CONTROL 10ML LL (SYRINGE) ×2 IMPLANT
TOWEL OR 17X24 6PK STRL BLUE (TOWEL DISPOSABLE) ×2 IMPLANT
TOWEL OR NON WOVEN STRL DISP B (DISPOSABLE) ×2 IMPLANT
TUBE CONNECTING 20X1/4 (TUBING) ×1 IMPLANT
WATER STERILE IRR 1000ML POUR (IV SOLUTION) ×1 IMPLANT
YANKAUER SUCT BULB TIP NO VENT (SUCTIONS) ×1 IMPLANT

## 2011-07-13 NOTE — H&P (View-Only) (Signed)
Patient ID: Yolanda Carroll, female   DOB: 06/07/1955, 55 y.o.   MRN: [id number]  Chief Complaint  Patient presents with  . Follow-up    rt br bx mass    HPI Yolanda Carroll is a 55 y.o. female.  Referred by Dr. Randy Jackson HPI [id number] who presents after having right breast tenderness.  She then underwent mmg which showed upper inner right breast mass on mmg and us.This is about 1.5 - 1.7 cm in size.  This was biopsied and is benign.  This appears discordant from appearance and she is referred for consideration of excision.  She has some tenderness on right that is not near the mass but no other complaints referable to her breasts.  Past Medical History  Diagnosis Date  . Arthritis   . GERD (gastroesophageal reflux disease)   . Scoliosis   . Migraine   . Allergy   . Night sweats   . Fatigue   . Wears glasses   . Reflux   . Endometriosis   . Recurrent vaginitis   . Osteoporosis   . IBS (irritable bowel syndrome)   . PONV (postoperative nausea and vomiting)     also difficult to wake up  . Cervical dysplasia   . Breast cyst     Past Surgical History  Procedure Date  . Rotator cuff repair     right 2002 left 2000  . Hernia repair 08/02/1995    [facility]  . Laparoscopic endometriosis fulguration 1997  . Pelvic laparoscopy   . Cystoscopy   . Appendectomy 1978  . Anterior cervical decomp/discectomy fusion 04/17/2011    Procedure: ANTERIOR CERVICAL DECOMPRESSION/DISCECTOMY FUSION 2 LEVELS;  Surgeon: Robert W Nudelman;  Location: [facility];  Service: Neurosurgery;  Laterality: N/A;  Cervical five-six, six-seven anterior cervical decompression with fusion,  plating,  and bonegraft   . Colposcopy   . Gynecologic cryosurgery   . Breast surgery     Breast Bx-Benign  . Breast biopsy 06/2011    right breast    Family History  Problem Relation Age of Onset  . Cancer Father     lymphoma  . Other Mother     bipolar,reflux  . Bipolar disorder Mother   . Other Brother    sinus problems  . Cancer Maternal Aunt     uterine cancer  . Breast cancer Maternal Aunt   . Diabetes Maternal Aunt   . Cancer Paternal Aunt     Colon cancer    Social History History  Substance Use Topics  . Smoking status: Former Smoker    Quit date: 05/17/1983  . Smokeless tobacco: Never Used  . Alcohol Use: 0.5 oz/week    1 drink(s) per week     socially, occasional    Allergies  Allergen Reactions  . Penicillins   . Nortriptyline Itching  . Tramadol Itching  . Darvocet (Propoxyphene N-Acetaminophen) Itching  . Percocet (Oxycodone-Acetaminophen) Itching    Current Outpatient Prescriptions  Medication Sig Dispense Refill  . azithromycin (ZITHROMAX) 500 MG tablet Take 500 mg by mouth daily.      . calcium-vitamin D (OSCAL) 250-125 MG-UNIT per tablet Take 1 tablet by mouth daily.        . Cholecalciferol (VITAMIN D PO) Take by mouth.        . divalproex (DEPAKOTE) 500 MG 24 hr tablet Take 500 mg by mouth daily.        . estradiol (ESTRACE) 0.5 MG tablet Take 1 tablet (0.5 mg   total) by mouth daily.  30 tablet  12  . fish oil-omega-3 fatty acids 1000 MG capsule Take 2 g by mouth daily.        . HYDROcodone-acetaminophen (NORCO) 5-325 MG per tablet Take 1 tablet by mouth every 6 (six) hours as needed. For pain        . medroxyPROGESTERone (PROVERA) 2.5 MG tablet Take 1 tablet (2.5 mg total) by mouth daily.  30 tablet  12  . Multiple Vitamins-Minerals (MULTIVITAMIN WITH MINERALS) tablet Take 1 tablet by mouth daily.        . OVER THE COUNTER MEDICATION 4 tablets by Per post-pyloric tube route daily. ISOCORT       . OVER THE COUNTER MEDICATION Take 1 tablet by mouth daily. excitaplus       . Probiotic Product (PROBIOTIC FORMULA PO) Take by mouth daily.          Review of Systems Review of Systems  Constitutional: Negative for fever, chills and unexpected weight change.  HENT: Negative for hearing loss, congestion, sore throat, trouble swallowing and voice change.   Eyes:  Negative for visual disturbance.  Respiratory: Negative for cough and wheezing.   Cardiovascular: Negative for chest pain, palpitations and leg swelling.  Gastrointestinal: Negative for nausea, vomiting, abdominal pain, diarrhea, constipation, blood in stool, abdominal distention and anal bleeding.  Genitourinary: Negative for hematuria, vaginal bleeding and difficulty urinating.  Musculoskeletal: Negative for arthralgias.  Skin: Negative for rash and wound.  Neurological: Negative for seizures, syncope and headaches.  Hematological: Negative for adenopathy. Does not bruise/bleed easily.  Psychiatric/Behavioral: Negative for confusion.    Blood pressure 94/68, pulse 76, temperature 98.4 F (36.9 C), temperature source Temporal, resp. rate 16, height 5' 7.5" (1.715 m), weight 132 lb 6.4 oz (60.056 kg).  Physical Exam Physical Exam  Vitals reviewed. Constitutional: She appears well-developed and well-nourished.  Neck: Neck supple.  Cardiovascular: Normal rate, regular rhythm and normal heart sounds.   Pulmonary/Chest: Effort normal and breath sounds normal. She has no wheezes. She has no rales. Right breast exhibits no inverted nipple, no mass, no nipple discharge, no skin change and no tenderness. Left breast exhibits no inverted nipple, no mass, no nipple discharge, no skin change and no tenderness. Breasts are symmetrical.  Lymphadenopathy:    She has no cervical adenopathy.    Data Reviewed The pathology demonstrated fibrosis and inflammation with  microcalcifications present. There are ectatic ducts. When  correlating the extent of change on mammography with the pathology,  this is felt to be discordant. As a result, surgical consultation  for possible surgical excisional biopsy is recommended. The clip  is located within the central portion of the lesion on the post  biopsy mammogram. The patient has been scheduled to see Dr.  Alijah Hyde on 06/15/2011. However, the patient states  that she may  need to reschedule this appointment. Post biopsy wound care  instructions were reviewed with the patient. The patient states  her biopsy site is clean and dry without hematoma formation. The  patient was encouraged to call the [facility] for additional  questions or concerns.  Addended by: Chino Jackson, M.D. on 06/14/2011 13:27:42.  **END ADDENDUM** SIGNED BY: [place] Jackson, M.D.       Study Result     *RADIOLOGY REPORT*  Clinical Data: Right breast mass.  ULTRASOUND GUIDED CORE BIOPSY OF THE right BREAST  Comparison: Prior studies  I met with the patient and we discussed the procedure of ultrasound-  guided biopsy, including   benefits and alternatives. We discussed  the high likelihood of a successful procedure. We discussed the  risks of the procedure, including infection, bleeding, tissue  injury, clip migration, and inadequate sampling. Informed written  consent was given.  Using sterile technique 2% lidocaine, ultrasound guidance and a 14  gauge automated biopsy device, biopsy was performed of the mass  with calcification located within the right breast at the 1 o'clock  position. At the conclusion of the procedure a ribbon shaped  tissue marker clip was deployed into the biopsy cavity. Follow up  2 view mammogram was performed and dictated separately.  IMPRESSION:  Ultrasound guided biopsy of the right breast mass located at the 1  o'clock position as discussed above. No apparent complications.      Assessment    Right breast mass with discordant core biopsy    Plan    We discussed indication for excision.  We discussed right breast wire localization biopsy to rule out cancer. Risks include but not limited to bleeding, hematoma, infection, possible need for further surgery if cancer is found.  She is agreeable to proceed.  We discussed postoperative restrictions and recover.       Lennix Rotundo 06/30/2011, 12:01 PM    

## 2011-07-13 NOTE — Transfer of Care (Signed)
Immediate Anesthesia Transfer of Care Note  Patient: Yolanda Carroll  Procedure(s) Performed: Procedure(s) (LRB): BREAST BIOPSY WITH NEEDLE LOCALIZATION (Right)  Patient Location: PACU  Anesthesia Type: General  Level of Consciousness: awake, alert , oriented and patient cooperative  Airway & Oxygen Therapy: Patient Spontanous Breathing and Patient connected to face mask oxygen  Post-op Assessment: Report given to PACU RN, Post -op Vital signs reviewed and stable and Patient moving all extremities  Post vital signs: Reviewed and stable  Complications: No apparent anesthesia complications

## 2011-07-13 NOTE — Interval H&P Note (Signed)
History and Physical Interval Note:  07/13/2011 12:31 PM  Yolanda Carroll  has presented today for surgery, with the diagnosis of right breast mass  The various methods of treatment have been discussed with the patient and family. After consideration of risks, benefits and other options for treatment, the patient has consented to  Procedure(s) (LRB): BREAST BIOPSY WITH NEEDLE LOCALIZATION (Right) as a surgical intervention .  The patients' history has been reviewed, patient examined, no change in status, stable for surgery.  I have reviewed the patients' chart and labs.  Questions were answered to the patient's satisfaction.     Romulus Hanrahan

## 2011-07-13 NOTE — Discharge Instructions (Signed)
[facility] Office Phone Number [phone]  BREAST BIOPSY/ PARTIAL MASTECTOMY: POST OP INSTRUCTIONS  Always review your discharge instruction sheet given to you by the facility where your surgery was performed.  IF YOU HAVE DISABILITY OR FAMILY LEAVE FORMS, YOU MUST BRING THEM TO THE OFFICE FOR PROCESSING.  DO NOT GIVE THEM TO YOUR DOCTOR.  1. A prescription for pain medication may be given to you upon discharge.  Take your pain medication as prescribed, if needed.  If narcotic pain medicine is not needed, then you may take acetaminophen (Tylenol), naprosyn (Alleve) or ibuprofen (Advil) as needed. 2. Take your usually prescribed medications unless otherwise directed 3. If you need a refill on your pain medication, please contact your pharmacy.  They will contact our office to request authorization.  Prescriptions will not be filled after 5pm or on week-ends. 4. You should eat very light the first 24 hours after surgery, such as soup, crackers, pudding, etc.  Resume your normal diet the day after surgery. 5. Most patients will experience some swelling and bruising in the breast.  Ice packs and a good support bra will help.  Wear the breast binder provided or a sports bra for 72 hours day and night.  After that wear a sports bra during the day until you return to the office. Swelling and bruising can take several days to resolve.  6. It is common to experience some constipation if taking pain medication after surgery.  Increasing fluid intake and taking a stool softener will usually help or prevent this problem from occurring.  A mild laxative (Milk of Magnesia or Miralax) should be taken according to package directions if there are no bowel movements after 48 hours. 7. Unless discharge instructions indicate otherwise, you may remove your bandages 48 hours after surgery and you may shower at that time.  You may have steri-strips (small skin tapes) in place directly over the incision.   These strips should be left on the skin for 7-10 days and will come off on their own.  If your surgeon used skin glue on the incision, you may shower in 24 hours.  The glue will flake off over the next 2-3 weeks.  Any sutures or staples will be removed at the office during your follow-up visit. 8. ACTIVITIES:  You may resume regular daily activities (gradually increasing) beginning the next day.  Wearing a good support bra or sports bra minimizes pain and swelling.  You may have sexual intercourse when it is comfortable. a. You may drive when you no longer are taking prescription pain medication, you can comfortably wear a seatbelt, and you can safely maneuver your car and apply brakes. b. RETURN TO WORK:  ______________________________________________________________________________________ 9. You should see your doctor in the office for a follow-up appointment approximately two weeks after your surgery.  Your doctor's nurse will typically make your follow-up appointment when she calls you with your pathology report.  Expect your pathology report 3-4 business days after your surgery.  You may call to check if you do not hear from us after three days. 10. OTHER INSTRUCTIONS: _______________________________________________________________________________________________ _____________________________________________________________________________________________________________________________________ _____________________________________________________________________________________________________________________________________ _____________________________________________________________________________________________________________________________________  WHEN TO CALL DR WAKEFIELD: 1. Fever over 101.0 2. Nausea and/or vomiting. 3. Extreme swelling or bruising. 4. Continued bleeding from incision. 5. Increased pain, redness, or drainage from the incision.  The clinic staff is available to  answer your questions during regular business hours.  Please don't hesitate to call and ask to speak to one of the nurses for   clinical concerns.  If you have a medical emergency, go to the nearest emergency room or call 911.  A surgeon from [facility] is always on call at the hospital.  For further questions, please visit [email or web address]   [facility]  [address] [place], [place] [address] [phone]   Post Anesthesia Home Care Instructions  Activity: Get plenty of rest for the remainder of the day. A responsible adult should stay with you for 24 hours following the procedure.  For the next 24 hours, DO NOT: -Drive a car -Operate machinery -Drink alcoholic beverages -Take any medication unless instructed by your physician -Make any legal decisions or sign important papers.  Meals: Start with liquid foods such as gelatin or soup. Progress to regular foods as tolerated. Avoid greasy, spicy, heavy foods. If nausea and/or vomiting occur, drink only clear liquids until the nausea and/or vomiting subsides. Call your physician if vomiting continues.  Special Instructions/Symptoms: Your throat may feel dry or sore from the anesthesia or the breathing tube placed in your throat during surgery. If this causes discomfort, gargle with warm salt water. The discomfort should disappear within 24 hours.   

## 2011-07-13 NOTE — Anesthesia Procedure Notes (Signed)
Procedure Name: LMA Insertion Date/Time: 07/13/2011 12:51 PM Performed by: Meyer Russel Pre-anesthesia Checklist: Patient identified, Emergency Drugs available, Suction available, Patient being monitored and Timeout performed Patient Re-evaluated:Patient Re-evaluated prior to inductionOxygen Delivery Method: Circle System Utilized Preoxygenation: Pre-oxygenation with 100% oxygen Intubation Type: IV induction Ventilation: Mask ventilation without difficulty LMA: LMA inserted LMA Size: 4.0 Number of attempts: 1 Airway Equipment and Method: bite block Placement Confirmation: positive ETCO2 and breath sounds checked- equal and bilateral Tube secured with: Tape Dental Injury: Teeth and Oropharynx as per pre-operative assessment

## 2011-07-13 NOTE — Anesthesia Postprocedure Evaluation (Signed)
Anesthesia Post Note  Patient: Yolanda Carroll  Procedure(s) Performed: Procedure(s) (LRB): BREAST BIOPSY WITH NEEDLE LOCALIZATION (Right)  Anesthesia type: General  Patient location: PACU  Post pain: Pain level controlled  Post assessment: Patient's Cardiovascular Status Stable  Last Vitals:  Filed Vitals:   07/13/11 1430  BP: 115/34  Pulse: 70  Temp:   Resp: 10    Post vital signs: Reviewed and stable  Level of consciousness: alert  Complications: No apparent anesthesia complications

## 2011-07-13 NOTE — Op Note (Signed)
Preoperative diagnosis: Right breast mammographic abnormality with discordant biopsy Postoperative diagnosis: Same as above Procedure: Right breast wire guided biopsy Surgeon: Dr. Harden Mo Anesthesia: Gen. Estimated blood loss: Minimal Specimens: Right breast tissue marked with paint kit Complications: None Findings: Confirmation of removal of lesion, clipped, and wire with mammogram by both me and radiology Sponge and needle count correct times 2 at end of operation Disposition to recovery room in stable condition  Indications: This is a 56 year old female who underwent routine screening mammogram with about a 1.5 cm right breast mass and associated microcalcifications. She underwent a radiologic core biopsy that was benign. This was discordant to the mammographic appearance. She was then referred for excisional biopsy. We discussed the risk and benefits of a wire-guided biopsy.  Procedure: After informed consent was obtained the patient was taken first to the breast center. She had a wire placed. She was then brought to day surgery. I have her mammograms available for my review in the operating room. She had sequential compression devices placed on her legs prior to beginning. She was then taken to the operating room and placed under general anesthesia with an LMA. Her right breast was then prepped and draped in the standard sterile surgical fashion. A surgical timeout was then performed.  I made a curvilinear incision over the lesion. I then used cautery to dissect down. I brought the wire in remotely. I then used cautery to excise the wire and the lesion which was palpable. Mammogram was then taken confirming removal of the clip, wire, and lesion. This was also confirmed by radiology. Hemostasis was observed. I closed this with 3-0 Vicryl and 4 Monocryl. Quarter percent Marcaine was infiltrated throughout the wound. I then put Steri-Strips a sterile dressing in place. She tolerated this  well was extubated and transferred to recovery in stable condition.

## 2011-07-13 NOTE — Anesthesia Preprocedure Evaluation (Addendum)
Anesthesia Evaluation  Patient identified by MRN, date of birth, ID band Patient awake    Reviewed: Allergy & Precautions, H&P , NPO status , Patient's Chart, lab work & pertinent test results  History of Anesthesia Complications (+) PONV  Airway Mallampati: II  Neck ROM: Limited    Dental  (+) Teeth Intact   Pulmonary neg pulmonary ROS,  clear to auscultation        Cardiovascular neg cardio ROS Regular Normal    Neuro/Psych  Headaches,    GI/Hepatic Neg liver ROS, GERD-  ,Swallowing difficulties since acdf   Endo/Other  Negative Endocrine ROS  Renal/GU negative Renal ROS     Musculoskeletal   Abdominal   Peds  Hematology negative hematology ROS (+)   Anesthesia Other Findings   Reproductive/Obstetrics                           Anesthesia Physical Anesthesia Plan  ASA: II  Anesthesia Plan: General   Post-op Pain Management:    Induction: Intravenous  Airway Management Planned: LMA  Additional Equipment:   Intra-op Plan:   Post-operative Plan: Extubation in OR  Informed Consent: I have reviewed the patients History and Physical, chart, labs and discussed the procedure including the risks, benefits and alternatives for the proposed anesthesia with the patient or authorized representative who has indicated his/her understanding and acceptance.   Dental advisory given and Dental Advisory Given  Plan Discussed with: CRNA and Surgeon  Anesthesia Plan Comments:        Anesthesia Quick Evaluation

## 2011-07-14 ENCOUNTER — Encounter (HOSPITAL_BASED_OUTPATIENT_CLINIC_OR_DEPARTMENT_OTHER): Payer: Self-pay | Admitting: General Surgery

## 2011-07-17 ENCOUNTER — Telehealth (INDEPENDENT_AMBULATORY_CARE_PROVIDER_SITE_OTHER): Payer: Self-pay

## 2011-07-17 NOTE — Telephone Encounter (Signed)
Called pt to notify her the path report was good with no atypia or malgnancy identified per Dr Dwain Sarna.

## 2011-08-07 ENCOUNTER — Encounter (INDEPENDENT_AMBULATORY_CARE_PROVIDER_SITE_OTHER): Payer: Self-pay | Admitting: General Surgery

## 2011-08-07 ENCOUNTER — Ambulatory Visit (INDEPENDENT_AMBULATORY_CARE_PROVIDER_SITE_OTHER): Payer: 59 | Admitting: General Surgery

## 2011-08-07 VITALS — BP 90/72 | HR 76 | Resp 16 | Ht 68.5 in | Wt 133.0 lb

## 2011-08-07 DIAGNOSIS — Z09 Encounter for follow-up examination after completed treatment for conditions other than malignant neoplasm: Secondary | ICD-10-CM

## 2011-08-07 NOTE — Patient Instructions (Signed)

## 2011-08-07 NOTE — Progress Notes (Signed)
Subjective:     Patient ID: Yolanda Carroll, female   DOB: 1955/07/27, 56 y.o.   MRN: [id number]  HPI This is a 56 year old female who had a right breast mammographic abnormality. This was discordant from the biopsy and then I took her for a right breast wire-guided biopsy. This is fibrocystic changes with calcifications. She returns today without any complaints.  Review of Systems     Objective:   Physical Exam  Vitals reviewed. Pulmonary/Chest:         Assessment:     S/p right breast wire guided biopsy    Plan:     This is benign and I discussed pathology today.  Recommended return to all normal activity as well as routine screening.  I will have her come back and see me as needed.

## 2011-10-24 ENCOUNTER — Ambulatory Visit (INDEPENDENT_AMBULATORY_CARE_PROVIDER_SITE_OTHER): Payer: 59 | Admitting: Obstetrics and Gynecology

## 2011-10-24 DIAGNOSIS — N644 Mastodynia: Secondary | ICD-10-CM

## 2011-10-24 DIAGNOSIS — Z780 Asymptomatic menopausal state: Secondary | ICD-10-CM

## 2011-10-24 NOTE — Progress Notes (Signed)
The patient came to see me today because of hot flashes, irritability, sleep disturbance and fatigue. We have had her on HRT for a significant period time. We reduced her estradiol from 1 mg to half  a milligram due to mastodynia. We also had her stop the estradiol and the 2.5 mg medroxyprogesterone 3 days a month for the mastodynia as well. All was going well until her neurologist stopped her Serzone and kept her just on Depakote for her headaches. He stopped her Serzone because of the concern that he can cause liver damage. Her headaches are fine on Depakote alone but this is when the above symptoms started. She had previously tried Effexor but didn't agree with her.  Assessment: #1. Menopausal symptoms #2. Mastodynia  Plan: We increased her estradiol 1 mg daily. She will continue with a 3 day break per month. If the mastodynia returns she will take a five-day monthly break. We discussed at some point either adding Pristiq or  testosterone to the estrogen depending on her symptoms. She does have some mild vaginal dryness and I think the increased estradiol we'll help  that as well.

## 2011-12-22 ENCOUNTER — Telehealth: Payer: Self-pay | Admitting: *Deleted

## 2011-12-22 NOTE — Telephone Encounter (Signed)
(  Pt aware you are out) pt has is taking estradiol 1 mg and states that her acid reflex has been much worse with the increase of estradiol. The breast tenderness is better but this is only for about 5 days and then around on day 10 it starts back. Pt said she would like to switch back to 1/2 mg dose if possible. No other complaints.

## 2011-12-25 NOTE — Telephone Encounter (Signed)
Pt informed with the below note. 

## 2011-12-25 NOTE — Telephone Encounter (Signed)
That is fine 

## 2011-12-25 NOTE — Telephone Encounter (Signed)
See the below note pt takes Pepcid for her acid reflex. No hot flashes nor other complaints.  Please advise

## 2012-01-29 ENCOUNTER — Other Ambulatory Visit: Payer: Self-pay | Admitting: Gastroenterology

## 2012-01-29 DIAGNOSIS — R1310 Dysphagia, unspecified: Secondary | ICD-10-CM

## 2012-02-02 ENCOUNTER — Ambulatory Visit
Admission: RE | Admit: 2012-02-02 | Discharge: 2012-02-02 | Disposition: A | Discharge status: Home / Self Care | Payer: 59 | Source: Ambulatory Visit | Attending: Gastroenterology | Admitting: Gastroenterology

## 2012-02-02 DIAGNOSIS — R1310 Dysphagia, unspecified: Secondary | ICD-10-CM

## 2012-02-21 ENCOUNTER — Encounter (HOSPITAL_COMMUNITY): Payer: Self-pay | Admitting: Emergency Medicine

## 2012-02-21 ENCOUNTER — Emergency Department (HOSPITAL_COMMUNITY)
Admission: EM | Admit: 2012-02-21 | Discharge: 2012-02-21 | Disposition: A | Discharge status: Home / Self Care | Payer: 59 | Attending: Emergency Medicine | Admitting: Emergency Medicine

## 2012-02-21 ENCOUNTER — Emergency Department (HOSPITAL_COMMUNITY): Payer: 59

## 2012-02-21 DIAGNOSIS — M129 Arthropathy, unspecified: Secondary | ICD-10-CM | POA: Insufficient documentation

## 2012-02-21 DIAGNOSIS — K219 Gastro-esophageal reflux disease without esophagitis: Secondary | ICD-10-CM | POA: Insufficient documentation

## 2012-02-21 DIAGNOSIS — R072 Precordial pain: Secondary | ICD-10-CM | POA: Insufficient documentation

## 2012-02-21 DIAGNOSIS — R079 Chest pain, unspecified: Secondary | ICD-10-CM

## 2012-02-21 DIAGNOSIS — M810 Age-related osteoporosis without current pathological fracture: Secondary | ICD-10-CM | POA: Insufficient documentation

## 2012-02-21 DIAGNOSIS — R209 Unspecified disturbances of skin sensation: Secondary | ICD-10-CM | POA: Insufficient documentation

## 2012-02-21 DIAGNOSIS — Z79899 Other long term (current) drug therapy: Secondary | ICD-10-CM | POA: Insufficient documentation

## 2012-02-21 LAB — COMPREHENSIVE METABOLIC PANEL
ALT: 12 U/L (ref 0–35)
AST: 18 U/L (ref 0–37)
Albumin: 4.2 g/dL (ref 3.5–5.2)
Alkaline Phosphatase: 36 U/L — ABNORMAL LOW (ref 39–117)
BUN: 19 mg/dL (ref 6–23)
CO2: 29 mEq/L (ref 19–32)
Calcium: 9.5 mg/dL (ref 8.4–10.5)
Chloride: 100 mEq/L (ref 96–112)
Creatinine, Ser: 0.79 mg/dL (ref 0.50–1.10)
GFR calc Af Amer: >90 mL/min (ref >90)
GFR calc non Af Amer: >90 mL/min (ref >90)
Glucose, Bld: 85 mg/dL (ref 70–99)
Potassium: 3.4 mEq/L — ABNORMAL LOW (ref 3.5–5.1)
Sodium: 139 mEq/L (ref 135–145)
Total Bilirubin: 0.3 mg/dL (ref 0.3–1.2)
Total Protein: 7.6 g/dL (ref 6.0–8.3)

## 2012-02-21 LAB — POCT I-STAT TROPONIN I
Troponin i, poc: 0.03 ng/mL (ref 0.00–0.08)
Troponin i, poc: 0 ng/mL (ref 0.00–0.08)

## 2012-02-21 LAB — CBC WITH DIFFERENTIAL/PLATELET
Basophils Absolute: 0 10*3/uL (ref 0.0–0.1)
Basophils Relative: 1 % (ref 0–1)
Eosinophils Absolute: 0.1 10*3/uL (ref 0.0–0.7)
Eosinophils Relative: 1 % (ref 0–5)
HCT: 39.6 % (ref 36.0–46.0)
Hemoglobin: 13.3 g/dL (ref 12.0–15.0)
Lymphocytes Relative: 46 % (ref 12–46)
Lymphs Abs: 2.5 10*3/uL (ref 0.7–4.0)
MCH: 30.6 pg (ref 26.0–34.0)
MCHC: 33.6 g/dL (ref 30.0–36.0)
MCV: 91 fL (ref 78.0–100.0)
Monocytes Absolute: 0.7 10*3/uL (ref 0.1–1.0)
Monocytes Relative: 14 % — ABNORMAL HIGH (ref 3–12)
Neutro Abs: 2.1 10*3/uL (ref 1.7–7.7)
Neutrophils Relative %: 39 % — ABNORMAL LOW (ref 43–77)
Platelets: 213 10*3/uL (ref 150–400)
RBC: 4.35 MIL/uL (ref 3.87–5.11)
RDW: 12.8 % (ref 11.5–15.5)
WBC: 5.4 10*3/uL (ref 4.0–10.5)

## 2012-02-21 LAB — D-DIMER, QUANTITATIVE (NOT AT ARMC): D-Dimer, Quant: <0.27 ug/mL-FEU (ref 0.00–0.48)

## 2012-02-21 MED ORDER — MORPHINE SULFATE 4 MG/ML IJ SOLN
4.0000 mg | Freq: Once | INTRAMUSCULAR | Status: AC
  Filled 2012-02-21: qty 1

## 2012-02-21 MED ORDER — ASPIRIN 81 MG PO CHEW
324.0000 mg | CHEWABLE_TABLET | Freq: Once | ORAL | Status: AC
  Administered 2012-02-21: 324 mg via ORAL
  Filled 2012-02-21: qty 4

## 2012-02-21 NOTE — ED Notes (Signed)
Onset one day ago chest pressure with radiating left shoulder pain continued today. Seen by Primary Doctor today and sent to ED for evaluation. Currently chest pressure 2/10. Denies SOB.  Stated started new medication Celexea Friday with last dose one day ago.

## 2012-02-21 NOTE — ED Provider Notes (Signed)
History     CSN: [id number]  Arrival date & time 02/21/12  1700   First MD Initiated Contact with Patient 02/21/12 1939      Chief Complaint  Patient presents with  . Chest Pain    (Consider location/radiation/quality/duration/timing/severity/associated sxs/prior treatment) HPI Comments: Patient comes in today with a chief complaint of chest pain.  Pain located substernal and radiates to her left arm.  She began having the chest pain yesterday.  The pain lasted most of the day and then subsided without intervention.  She then began having the pain again today approximately 7 hours prior to arrival in the ED and has been constant since that time.  She was not doing anything exertional at the onset of the pain.  She describes the pain as a heavy feeling.  She has not taken anything for the pain prior to arrival.  She had surgery for Anterior cervical fusion in December 2012 and has some numbness and pain of her left arm at baseline.  She denies SOB,  nausea, vomiting, diaphoresis, dizziness, or syncope.  No prior cardiac history.  No prior history of DM, HTN, or Hyperlipidemia.  No FH of cardiac disease.  No prior history of DVT or PE.  No LE edema or pain.  No prolonged travel or surgeries in the past 4 weeks.  She currently does take hormone replacement therapy.  No prior history of Cancer.    Patient is a 56 y.o. female presenting with chest pain. The history is provided by the patient.  Chest Pain Pertinent negatives for primary symptoms include no fever, no shortness of breath, no cough, no wheezing, no palpitations, no abdominal pain, no nausea, no vomiting and no dizziness.  Associated symptoms include numbness.  Pertinent negatives for associated symptoms include no diaphoresis.     Past Medical History  Diagnosis Date  . Arthritis   . GERD (gastroesophageal reflux disease)   . Scoliosis   . Migraine   . Allergy   . Fatigue   . Wears glasses   . Reflux   . Endometriosis   .  Recurrent vaginitis   . Osteoporosis   . IBS (irritable bowel syndrome)   . PONV (postoperative nausea and vomiting)     also difficult to wake up  . Cervical dysplasia     Past Surgical History  Procedure Date  . Rotator cuff repair     right 2002 left 2000  . Hernia repair 08/02/1995    [facility]  . Laparoscopic endometriosis fulguration 1997  . Pelvic laparoscopy   . Cystoscopy   . Appendectomy 1978  . Anterior cervical decomp/discectomy fusion 04/17/2011    Procedure: ANTERIOR CERVICAL DECOMPRESSION/DISCECTOMY FUSION 2 LEVELS;  Surgeon: Hewitt Shorts;  Location: [facility];  Service: Neurosurgery;  Laterality: N/A;  Cervical five-six, six-seven anterior cervical decompression with fusion,  plating,  and bonegraft   . Colposcopy   . Gynecologic cryosurgery   . Breast surgery     Breast Bx-Benign  . Breast biopsy   . Breast biopsy 07/13/2011    Procedure: BREAST BIOPSY WITH NEEDLE LOCALIZATION;  Surgeon: Emelia Loron, MD;  Location: [facility];  Service: General;  Laterality: Right;  Right breast wire localization biopsy    Family History  Problem Relation Age of Onset  . Cancer Father     lymphoma  . Other Mother     bipolar,reflux  . Bipolar disorder Mother   . Other Brother     sinus problems  .  Cancer Maternal Aunt     uterine cancer  . Breast cancer Maternal Aunt   . Diabetes Maternal Aunt   . Cancer Paternal Aunt     Colon cancer    History  Substance Use Topics  . Smoking status: Former Smoker    Quit date: 05/17/1983  . Smokeless tobacco: Never Used  . Alcohol Use: 0.5 oz/week    1 drink(s) per week     socially, occasional    OB History    Grav Para Term Preterm Abortions TAB SAB Ect Mult Living   0               Review of Systems  Constitutional: Negative for fever, chills and diaphoresis.  HENT: Negative for neck pain.   Respiratory: Negative for cough, shortness of breath and wheezing.   Cardiovascular: Positive for  chest pain. Negative for palpitations and leg swelling.  Gastrointestinal: Negative for nausea, vomiting and abdominal pain.  Skin: Negative for rash.  Neurological: Positive for numbness. Negative for dizziness, syncope, light-headedness and headaches.    Allergies  Penicillins; Celexa; Nortriptyline; Tramadol; Darvocet; and Percocet  Home Medications   Current Outpatient Rx  Name Route Sig Dispense Refill  . CALCIUM 600 PO Oral Take 1 tablet by mouth 2 (two) times daily.     Marland Kitchen VITAMIN D PO Oral Take 800 Units by mouth 2 (two) times daily.     Marland Kitchen DIVALPROEX SODIUM ER 500 MG PO TB24 Oral Take 500 mg by mouth at bedtime.     Marland Kitchen ESTRADIOL 0.5 MG PO TABS Oral Take 0.5 mg by mouth at bedtime.    Marland Kitchen FAMOTIDINE 40 MG PO TABS Oral Take 40 mg by mouth at bedtime.    . OMEGA-3 FATTY ACIDS 1000 MG PO CAPS Oral Take 1 g by mouth daily.     Marland Kitchen MEDROXYPROGESTERONE ACETATE 2.5 MG PO TABS Oral Take 2.5 mg by mouth at bedtime.    Marland Kitchen OVER THE COUNTER MEDICATION Oral Take 4 tablets by mouth daily. ISOCORT    . PROBIOTIC FORMULA PO Oral Take 1 tablet by mouth daily.     Marland Kitchen VITAMIN E 400 UNITS PO CAPS Oral Take 400 Units by mouth daily.      BP 115/55  Pulse 58  Temp 98.2 F (36.8 C) (Oral)  Resp 16  SpO2 100%  Physical Exam  Nursing note and vitals reviewed. Constitutional: She appears well-developed and well-nourished. No distress.  HENT:  Head: Normocephalic and atraumatic.  Mouth/Throat: Oropharynx is clear and moist.  Neck: Normal range of motion. Neck supple.  Cardiovascular: Normal rate, regular rhythm, normal heart sounds and intact distal pulses.   Pulses:      Dorsalis pedis pulses are 2+ on the right side, and 2+ on the left side.  Pulmonary/Chest: Effort normal and breath sounds normal. No respiratory distress. She has no wheezes. She has no rales. She exhibits no tenderness.  Abdominal: Soft. There is no tenderness.  Musculoskeletal: Normal range of motion. She exhibits no edema.    Neurological: She is alert.  Skin: Skin is warm and dry. She is not diaphoretic.  Psychiatric: She has a normal mood and affect.    ED Course  Procedures (including critical care time)  Labs Reviewed  CBC WITH DIFFERENTIAL - Abnormal; Notable for the following:    Neutrophils Relative 39 (*)     Monocytes Relative 14 (*)     All other components within normal limits  COMPREHENSIVE METABOLIC PANEL - Abnormal;  Notable for the following:    Potassium 3.4 (*)     Alkaline Phosphatase 36 (*)     All other components within normal limits  POCT I-STAT TROPONIN I   Dg Chest 2 View  02/21/2012  *RADIOLOGY REPORT*  Clinical Data: Mid chest pressure for 1 day.  CHEST - 2 VIEW  Comparison: Thoracic spine radiographs 11/14/2011.  No prior chest radiographs available.  Findings: The heart size and mediastinal contours are stable. There is stable mild biapical scarring.  The lungs are otherwise clear.  There is no pleural effusion.  There is a moderate convex right thoracolumbar scoliosis.  Patient is status post lower cervical fusion.  IMPRESSION: No acute cardiopulmonary process.   Original Report Authenticated By: Gerrianne Scale, M.D.      No diagnosis found.   Date: 02/21/2012  Rate: 58  Rhythm: sinus bradycardia  QRS Axis: normal  Intervals: normal  ST/T Wave abnormalities: nonspecific T wave changes  Conduction Disutrbances:none  Narrative Interpretation:   Old EKG Reviewed: unchanged    MDM  Patient is to be discharged with recommendation to follow up with PCP in regards to today's hospital visit. Chest pain is not likely of cardiac or pulmonary etiology d/t presentation, VSS, no tracheal deviation, no JVD or new murmur,  Lungs RRR, breath sounds equal bilaterally, EKG without acute abnormalities, initial and 3 hour troponin negative, and negative CXR.  D-dimer also in the normal range.  Patient discharged home and instructed to follow up with PCP.  Return precautions discussed.   Pt appears reliable for follow up and is agreeable to discharge.   Case has been discussed with and seen by Dr. Manus Gunning who agrees with the above plan to discharge.         Pascal Lux [place], PA-C 02/22/12 1317

## 2012-02-24 NOTE — ED Provider Notes (Signed)
Medical screening examination/treatment/procedure(s) were conducted as a shared visit with non-physician practitioner(s) and myself.  I personally evaluated the patient during the encounter  Constant chest pain since 7 am that radiates to arm. "heaviness" started intermittently yesterday. No CAD history, DM< HTN, HLD, smoking, VTE EKG nonischemic, troponin negative. Atypical for ACS.  Patient offered CDU observation and stress test but declined.  Glynn Octave, MD 02/24/12 [phone]

## 2012-02-27 ENCOUNTER — Telehealth: Payer: Self-pay | Admitting: *Deleted

## 2012-02-27 NOTE — Telephone Encounter (Signed)
Pt called to let you know that she stopped taking her estradiol 1 mg tablets and progesterone 2.5 for 4 days now. She went to ED c/o chest pain and had chest x-ray done.  Pt asked if you thought okay for her to stop taking for a while and start back? Please advise

## 2012-02-27 NOTE — Telephone Encounter (Signed)
Chest x-ray in epic

## 2012-02-27 NOTE — Telephone Encounter (Signed)
I think that's all right. What did the chest x-ray show?

## 2012-02-29 NOTE — Telephone Encounter (Signed)
Follow up regarding the below. Pt started on medication for migraines and they indeed stopped. Pt has not started back on her HRT yet, she would like she how the new migraine medication works and she will then start back on HRT.

## 2012-03-27 ENCOUNTER — Ambulatory Visit: Payer: 59 | Admitting: Obstetrics and Gynecology

## 2012-04-30 ENCOUNTER — Ambulatory Visit (INDEPENDENT_AMBULATORY_CARE_PROVIDER_SITE_OTHER): Payer: 59 | Admitting: Obstetrics and Gynecology

## 2012-04-30 DIAGNOSIS — N951 Menopausal and female climacteric states: Secondary | ICD-10-CM

## 2012-04-30 DIAGNOSIS — R232 Flushing: Secondary | ICD-10-CM

## 2012-04-30 NOTE — Progress Notes (Signed)
Patient came back to see me today. When I saw her last summer she was taking HRT and we modified it so that she would take a three-day break every month to prevent mastodynia. She found that on 1 mg of estradiol She had mastodynia. However on half a milligram she did not. She takes continuous progestin in the form of medroxyprogesterone 2.5 mg daily. She was having some reflux with her HRT but that has responded to medication. In October she stopped her HRT because she was having trouble with migraines and had a cardiac and neurological evaluation which was normal. She is now doing well without headaches but her hot flashes have recurred. She came in today to discuss the next step.  Our plan initially would be for her to go back on estradiol half a milligram and medroxyprogesterone 2.5 mg daily of both with a three-day break each month to prevent mastodynia. If she needs a higher dose of estradiol for symptoms she can double the estradiol. If that causes mastodynia we discussed a combined estrogen-testosterone product.Consider estrogen-serm when available next year.  She will discuss with Dr. Audie Box in February.

## 2012-04-30 NOTE — Patient Instructions (Signed)
Reinitiate hormone replacement therapy.

## 2012-05-13 ENCOUNTER — Other Ambulatory Visit: Payer: Self-pay | Admitting: Gynecology

## 2012-05-13 DIAGNOSIS — Z1231 Encounter for screening mammogram for malignant neoplasm of breast: Secondary | ICD-10-CM

## 2012-06-12 ENCOUNTER — Ambulatory Visit (INDEPENDENT_AMBULATORY_CARE_PROVIDER_SITE_OTHER): Payer: 59 | Admitting: Sports Medicine

## 2012-06-12 VITALS — BP 108/60 | Ht 68.0 in | Wt 137.0 lb

## 2012-06-12 DIAGNOSIS — M419 Scoliosis, unspecified: Secondary | ICD-10-CM | POA: Insufficient documentation

## 2012-06-12 DIAGNOSIS — M4120 Other idiopathic scoliosis, site unspecified: Secondary | ICD-10-CM

## 2012-06-12 DIAGNOSIS — M722 Plantar fascial fibromatosis: Secondary | ICD-10-CM

## 2012-06-12 NOTE — Progress Notes (Signed)
HPI Patient is a 57 y/o female with PMH of arthritis, migraines, GERD, endometriosis, osteoporosis, cervical disc dysplasia s/p fusion (per patient, C5-C7), breast cyst, and cervical dysplasia who presents complaining of:  left heel pain x 1 year,   started out with a nodule located on plantar aspect of foot, in midfoot area.  States that she was diagnosed with plantar fascitis in the past and has been doing stretching exercises as well as icing with a can under her foot.   Also wears heel cups and  Takes Ibuprofen as needed for discomfort.  States that these have helped her some, but heel remains painful and pain that was located in center part of heel is now radiating to lateral side.  States at worst pain is 7/10 in intensity, often dull but can be sharp, especially when she steps out of bed in the morning.  Also states that pain is worse if she has been sitting for awhile and steps on foot.    Physical Exam: General: well-developed; well-nourished MSK: Feet, bilaterally: shortened 5th toes 2/2 ortho surgery in the past; Patient bearing weight on 2nd-4th toes.  Increased space noted between 2nd and 3rd toes RT.  Collapse of transverse arches noted. Upon internal rotation of forefoot, excess ankle laxity noted.  Neurovascularly intact distally. Good calcaneal excursion when standing on toes.  Normal ROM on ankle plantar and dorsi-flexion. Left foot also noted to have TTP over plantar fascia; no swelling noted. Limited motion of 1st MTP joint with some spurring over dorsum - left/  Normal motion on RT Spine: Patient's left shoulder is higher than right shoulder.  When patient walks, head tilts toward right.  Upon hip flexion, patient's right upper back noted to be higher than left with scoliotic curve  A/P: Plantar fascitis:  On ultrasound patient noted to have markedly thickened left plantar fascia, measured to be 57mm compared to 39mm on the right.  Patient given green insoles today as well as  scaphoid patch for long arch support, and metatarsal arch supports.  She has been encouraged to continue to use her arch sleeve for her left foot as well as heel cups in both shoes.  She has also been instructed to do heel stretching and exercises along with continued ice use.  Patient has been provided with exercise instruction sheet which includes illustrations.  She is to follow up in 2 months. If not improved, will consider orthotics.

## 2012-06-12 NOTE — Assessment & Plan Note (Signed)
Given a standard protocol with stretches home exercises we also made her sports insoles with arch support and metatarsal pad  After 2 months Toprol I would like to see her back for reevaluation

## 2012-06-21 ENCOUNTER — Other Ambulatory Visit: Payer: Self-pay | Admitting: Gynecology

## 2012-06-21 ENCOUNTER — Ambulatory Visit
Admission: RE | Admit: 2012-06-21 | Discharge: 2012-06-21 | Disposition: A | Discharge status: Home / Self Care | Payer: 59 | Source: Ambulatory Visit | Attending: Gynecology | Admitting: Gynecology

## 2012-06-21 DIAGNOSIS — Z1231 Encounter for screening mammogram for malignant neoplasm of breast: Secondary | ICD-10-CM

## 2012-06-21 DIAGNOSIS — R928 Other abnormal and inconclusive findings on diagnostic imaging of breast: Secondary | ICD-10-CM

## 2012-06-25 ENCOUNTER — Ambulatory Visit
Admission: RE | Admit: 2012-06-25 | Discharge: 2012-06-25 | Disposition: A | Discharge status: Home / Self Care | Payer: 59 | Source: Ambulatory Visit | Attending: Gynecology | Admitting: Gynecology

## 2012-06-25 DIAGNOSIS — R928 Other abnormal and inconclusive findings on diagnostic imaging of breast: Secondary | ICD-10-CM

## 2012-06-29 ENCOUNTER — Other Ambulatory Visit: Payer: Self-pay

## 2012-07-02 ENCOUNTER — Encounter: Payer: Self-pay | Admitting: Gynecology

## 2012-07-02 ENCOUNTER — Ambulatory Visit (INDEPENDENT_AMBULATORY_CARE_PROVIDER_SITE_OTHER): Payer: 59 | Admitting: Gynecology

## 2012-07-02 VITALS — BP 120/76 | Ht 68.0 in | Wt 136.0 lb

## 2012-07-02 DIAGNOSIS — M722 Plantar fascial fibromatosis: Secondary | ICD-10-CM | POA: Insufficient documentation

## 2012-07-02 DIAGNOSIS — M810 Age-related osteoporosis without current pathological fracture: Secondary | ICD-10-CM

## 2012-07-02 DIAGNOSIS — Z01419 Encounter for gynecological examination (general) (routine) without abnormal findings: Secondary | ICD-10-CM

## 2012-07-02 MED ORDER — MEDROXYPROGESTERONE ACETATE 2.5 MG PO TABS: 2.5000 mg | ORAL_TABLET | Freq: Every day | ORAL | Status: DC

## 2012-07-02 MED ORDER — ESTRADIOL 0.5 MG PO TABS: ORAL_TABLET | ORAL | Status: DC

## 2012-07-02 NOTE — Progress Notes (Signed)
Yolanda Carroll November 17, 1955 [id number]        57 y.o.  G0P0 for annual exam.  Former patient Dr. Verl Dicker with several issues noted below.  Past medical history,surgical history, medications, allergies, family history and social history were all reviewed and documented in the EPIC chart. ROS:  Was performed and pertinent positives and negatives are included in the history.  Exam: [name] assistant Filed Vitals:   07/02/12 1610  BP: 120/76  Height: 5\' 8"  (1.727 m)  Weight: 136 lb (61.689 kg)   General appearance  Normal Skin grossly normal Head/Neck normal with no cervical or supraclavicular adenopathy thyroid normal Lungs  clear Cardiac RR, without RMG Abdominal  soft, nontender, without masses, organomegaly or hernia Breasts  examined lying and sitting without masses, retractions, discharge or axillary adenopathy. Pelvic  Ext/BUS/vagina  normal with atrophic changes  Cervix  normal with atrophic changes  Uterus  axial, normal size, shape and contour, midline and mobile nontender   Adnexa  Without masses or tenderness    Anus and perineum  normal   Rectovaginal  normal sphincter tone without palpated masses or tenderness.    Assessment/Plan:  [name].   1. Menopausal symptoms. Patient is on estradiol 0.5 mg and Provera 2.5 mg daily for menopausal symptoms. Getting great relief other than still having dyspareunia for which she uses OTC lubricants. Offered vaginal support such as Vagifem or estrace cream she declined. Discussed WHI study with increased risk of stroke heart attack DVT and breast cancer. Patient understands the risks and was to continue on HRT stating she feels much better and has a better quality of life. I refilled her both prescriptions x1 year. 2. Osteoporosis. Patient's listed as having osteoporosis but her paper trail as far as her treatment his worse. She had been treated with Sandrea Hammond and atelvia in the past but relates being treated for  2 years with Forteo which apparently was prescribed by her orthopedic surgeon. Her only listed DEXAs scans T-scores are all both 2.5 and on clear the diagnosis of osteoporosis as from a fracture history where she has scans other places that did have in fact T-scores of -2.5. Regardless her most recent study was 09/2010 showing T score -2.3 right femoral neck. Will repeat this year in June at a 2 year interval and then we'll go from there. She is on HRT and plans to continue and as long as her bone density appears stable them we'll plan on following. 3. Pap smear 06/2011. No Pap smear done today.  Does have a history of cryosurgery for "abnormal Pap smear" over 30 years ago. All of her Pap smears since then have been normal.  We'll plan repeat at 3 year interval. 4. Mammography 06/2012. They recommended a 6 month follow up and she knows to schedule that. SBE monthly reviewed. 5. Colonoscopy 4-5 years ago. We'll repeat at their recommended interval. 6. Health maintenance.  The blood work done as it is all done through her primary is physician's office who she sees a regular basis. Follow up one year, sooner as needed.    Yolanda Lords MD, 5:05 PM 07/02/2012

## 2012-07-02 NOTE — Patient Instructions (Addendum)
Follow up in one year for annual exam. Follow up for a bone density in June 2014. Follow up for your mammogram 6 month study as recommended by radiology.

## 2012-07-16 ENCOUNTER — Telehealth: Payer: Self-pay | Admitting: *Deleted

## 2012-07-16 MED ORDER — ESTRADIOL 0.5 MG PO TABS: 0.5000 mg | ORAL_TABLET | Freq: Every day | ORAL | Status: DC

## 2012-07-16 NOTE — Telephone Encounter (Signed)
Pt was given estradiol 0.5 mg tablet to take 1/2 po daily on 07/02/12 OV. Pt said that she actually should be taking estradiol 0.5 mg 1 po daily, she was getting estradiol 1 mg and take only 1/2 tablet per Dr.G (see 07/04/12 note). Okay to sent estradiol 0.5 mg rx? Please advise

## 2012-07-16 NOTE — Telephone Encounter (Signed)
rx sent, pt infomed

## 2012-07-16 NOTE — Telephone Encounter (Signed)
Can either go with 0.5 mg daily or one half of 1 mg tablet however she prefers.

## 2012-08-08 ENCOUNTER — Ambulatory Visit: Payer: 59 | Admitting: Sports Medicine

## 2012-08-21 ENCOUNTER — Telehealth: Payer: Self-pay | Admitting: *Deleted

## 2012-08-21 DIAGNOSIS — N6310 Unspecified lump in the right breast, unspecified quadrant: Secondary | ICD-10-CM

## 2012-08-21 NOTE — Telephone Encounter (Signed)
The area that we were "watching" was not a palpable area it was a mammographic calcified change that they were following. I think it's okay for her to go ahead and schedule a diagnostic mammogram and ultrasound to expedite things if she's feeling something but I would recommend after she has these studies to follow up with me to allow me to examine her and formulate a plan of management.

## 2012-08-21 NOTE — Telephone Encounter (Signed)
Pt called stating she has a lump in right breast, this is the same breast that you and pt have been "watching". She called and said the breast center would like to do a diag. Mammogram, she asked if okay to have this without OV? Please advise

## 2012-08-21 NOTE — Telephone Encounter (Signed)
Pt informed with the below note, orders placed. 

## 2012-08-30 ENCOUNTER — Ambulatory Visit
Admission: RE | Admit: 2012-08-30 | Discharge: 2012-08-30 | Disposition: A | Discharge status: Home / Self Care | Payer: 59 | Source: Ambulatory Visit | Attending: Gynecology | Admitting: Gynecology

## 2012-08-30 DIAGNOSIS — N6310 Unspecified lump in the right breast, unspecified quadrant: Secondary | ICD-10-CM

## 2012-09-04 ENCOUNTER — Ambulatory Visit (INDEPENDENT_AMBULATORY_CARE_PROVIDER_SITE_OTHER): Payer: 59 | Admitting: Sports Medicine

## 2012-09-04 VITALS — BP 100/60 | Ht 68.0 in | Wt 132.0 lb

## 2012-09-04 DIAGNOSIS — M4120 Other idiopathic scoliosis, site unspecified: Secondary | ICD-10-CM

## 2012-09-04 DIAGNOSIS — R269 Unspecified abnormalities of gait and mobility: Secondary | ICD-10-CM | POA: Insufficient documentation

## 2012-09-04 DIAGNOSIS — M419 Scoliosis, unspecified: Secondary | ICD-10-CM

## 2012-09-04 DIAGNOSIS — M722 Plantar fascial fibromatosis: Secondary | ICD-10-CM

## 2012-09-04 NOTE — Progress Notes (Signed)
Patient ID: Yolanda Carroll, female   DOB: January 19, 1956, 57 y.o.   MRN: [id number]  Patients left foot started hurting more than 1 yr ago while she was doing a lot of walking in [place]; Later in year had to do a lot of walking [place] and this flared again. Orthotics help when she can use exercises and stretches help Has had some relief, but still has pain.  She feels that the inserts stretching and exercise program had helped about 20%  30 yrs ago had surgery on little toes to shorten and take off corns These have been short since then.  Hx of osteoporosis 2 yrs of Forteo Now calcium and estradiol and provera  History of scoliosis that was fairly severe it started in her sophomore year of high school when she hit the where a full upper body brace Since that time she has consistently done some exercises to keep this from worsening  Physical Exam:  NAD  5th toe shortened bilaterally from surgery L>R Neither 5th toe makes contact with ground Lt heel still tender Good great toe motion bilat Trans arch flattening bilat Long arch preserved bilat  Lying-  Rt leg 2 cm shorter than lt  Sitting- Rt leg 1 cm shorter than lt  Walking gait shows a shift to the shorter right side  The scoliosis shows a significant thoracic curve and with bending forward the right upper back is elevated with a concave curve to the left  This leaves the right shoulder lower and the right hip slightly lower

## 2012-09-04 NOTE — Assessment & Plan Note (Signed)
Patient was fitted for a : standard, cushioned, semi-rigid orthotic. The orthotic was heated and afterward the patient stood on the orthotic blank positioned on the orthotic stand. The patient was positioned in subtalar neutral position and 10 degrees of ankle dorsiflexion in a weight bearing stance. After completion of molding, a stable base was applied to the orthotic blank. The blank was ground to a stable position for weight bearing. Size: 8 Base: Red Posting: Blue EVA Additional orthotic padding: blue foam on right  Pt was comfortable and walking gait neutral in orthotics.  Rt shoulder and hip height much more even with left side with correction.

## 2012-09-04 NOTE — Assessment & Plan Note (Signed)
I reinforced some of the exercises to keep her thoracic curve from increasing

## 2012-09-04 NOTE — Assessment & Plan Note (Signed)
Patient was fitted for a : standard, cushioned, semi-rigid orthotic. The orthotic was heated and afterward the patient stood on the orthotic blank positioned on the orthotic stand. The patient was positioned in subtalar neutral position and 10 degrees of ankle dorsiflexion in a weight bearing stance. After completion of molding, a stable base was applied to the orthotic blank. The blank was ground to a stable position for weight bearing. Size: 8 red eva Base: blue eva Posting: blue foam lift on RT Additional orthotic padding: none Time 45 mins with more than half face to face  Counseled about the use of orthotics  Her gait looked improved after completion of the procedure  We should recheck this in 2 months

## 2012-09-04 NOTE — Patient Instructions (Addendum)
Please wear orthotics as much as possible  Try the arch supports in your dress shoes   For scoliosis-  Lateral bent to the right  Standing trunk rotations from side to side  Please follow up in 2 months   Thank you for seeing [place] today!

## 2012-09-04 NOTE — Assessment & Plan Note (Signed)
she clearly has plantar fasciitis but the fact that it has not resolved in more than one year makes me think that the gait abnormality is part of the issue  Continue the use of stretches and exercises  Scaphoid pad added to dress shoes  Use orthotics when possible see below

## 2012-09-18 ENCOUNTER — Ambulatory Visit: Payer: Self-pay | Admitting: Gynecology

## 2012-11-05 ENCOUNTER — Encounter: Payer: Self-pay | Admitting: Sports Medicine

## 2012-11-05 ENCOUNTER — Ambulatory Visit (INDEPENDENT_AMBULATORY_CARE_PROVIDER_SITE_OTHER): Payer: 59 | Admitting: Sports Medicine

## 2012-11-05 VITALS — BP 109/71 | HR 65 | Ht 68.0 in | Wt 132.0 lb

## 2012-11-05 DIAGNOSIS — M24873 Other specific joint derangements of unspecified ankle, not elsewhere classified: Secondary | ICD-10-CM

## 2012-11-05 DIAGNOSIS — M722 Plantar fascial fibromatosis: Secondary | ICD-10-CM

## 2012-11-05 DIAGNOSIS — M24876 Other specific joint derangements of unspecified foot, not elsewhere classified: Secondary | ICD-10-CM

## 2012-11-05 DIAGNOSIS — M25372 Other instability, left ankle: Secondary | ICD-10-CM | POA: Insufficient documentation

## 2012-11-05 NOTE — Progress Notes (Signed)
  Subjective:    Patient ID: Yolanda Carroll, female    DOB: [birth year], 57 y.o.   MRN: [id number]  HPI  Pt presents to clinic for f/u of lt plantar fasciitis which has not improved. Now has had for more than 18 mos She is wearing orthaheel sandals most of the time which are more comfortable than her orthotics that were made at the last visit.  Doing home exercises a few times per week.  Hx of left ankle dislocation Did PT for 6 mos or so Not doing any exercises for this now Ankle feels unstable at times   Review of Systems     Objective:   Physical Exam NAD  ttp over insertion of PF on lt Shortened 5th ray bilat  Left ankle has positive drawer and loose inversion testing No effusion Neg kleiger  Gait is neutral with orthotic support and no more trendelenburg  Gait with ankle support is not more comfortable Does feel somewhat more support with heel wedge on orthotics       Assessment & Plan:

## 2012-11-05 NOTE — Assessment & Plan Note (Signed)
Compression did not feel good  Try balance exercises for ankle  This may worsen PF

## 2012-11-05 NOTE — Assessment & Plan Note (Signed)
Slow if any progress  Keep up exercises and stretches Not really icing  Use orthotics for exercise and try them with heel wedge  Reck 2 mos

## 2012-11-05 NOTE — Patient Instructions (Addendum)
Please do suggested ankle exercises daily Continue with plantar fasciitis exercises  Please follow up in 6 weeks  Thank you for seeing [place] today!

## 2012-11-13 ENCOUNTER — Ambulatory Visit (INDEPENDENT_AMBULATORY_CARE_PROVIDER_SITE_OTHER): Payer: 59 | Admitting: Gynecology

## 2012-11-13 ENCOUNTER — Encounter: Payer: Self-pay | Admitting: Gynecology

## 2012-11-13 DIAGNOSIS — N898 Other specified noninflammatory disorders of vagina: Secondary | ICD-10-CM

## 2012-11-13 DIAGNOSIS — N907 Vulvar cyst: Secondary | ICD-10-CM

## 2012-11-13 DIAGNOSIS — N9089 Other specified noninflammatory disorders of vulva and perineum: Secondary | ICD-10-CM

## 2012-11-13 DIAGNOSIS — L293 Anogenital pruritus, unspecified: Secondary | ICD-10-CM

## 2012-11-13 LAB — WET PREP FOR TRICH, YEAST, CLUE
Clue Cells Wet Prep HPF POC: NONE SEEN
Trich, Wet Prep: NONE SEEN
Yeast Wet Prep HPF POC: NONE SEEN

## 2012-11-13 NOTE — Patient Instructions (Signed)
Followup if vaginal cyst persists or worsens.

## 2012-11-13 NOTE — Addendum Note (Signed)
Addended by: Dayna Barker on: 11/13/2012 02:28 PM   Modules accepted: Orders

## 2012-11-13 NOTE — Progress Notes (Signed)
Patient presents complaining of a vaginal cyst. She had a small cystic area develop a month or so ago ultimately spontaneously drained. Now has recurred. Mildly tender. She does note some itching in the area.  Exam with Berenice Bouton External BUS vagina with mild atrophic changes. Small sebaceous type cyst starting to point right lower labium minora. No surrounding inflammatory change. Exam otherwise normal. Vagina grossly normal without significant discharge. Cervix normal. Uterus normal size midline mobile nontender. Adnexa without masses or tenderness.  Assessment and plan: Right labia minora small cyst probable sebaceous starting to point. Options to lance versus observe with warm soaks discussed. Patient would prefer to watch for now. She'll return if it persists, worsens or recurs. Wet prep was negative I think the itching is just from the cyst and she'll watch that for now.

## 2012-12-12 ENCOUNTER — Ambulatory Visit (INDEPENDENT_AMBULATORY_CARE_PROVIDER_SITE_OTHER): Payer: 59

## 2012-12-12 ENCOUNTER — Encounter: Payer: Self-pay | Admitting: Gynecology

## 2012-12-12 DIAGNOSIS — M810 Age-related osteoporosis without current pathological fracture: Secondary | ICD-10-CM

## 2012-12-17 ENCOUNTER — Ambulatory Visit: Payer: 59 | Admitting: Sports Medicine

## 2012-12-27 ENCOUNTER — Encounter: Payer: Self-pay | Admitting: Gynecology

## 2012-12-27 ENCOUNTER — Ambulatory Visit (INDEPENDENT_AMBULATORY_CARE_PROVIDER_SITE_OTHER): Payer: 59 | Admitting: Gynecology

## 2012-12-27 DIAGNOSIS — N951 Menopausal and female climacteric states: Secondary | ICD-10-CM

## 2012-12-27 DIAGNOSIS — Z79890 Hormone replacement therapy (postmenopausal): Secondary | ICD-10-CM

## 2012-12-27 DIAGNOSIS — M899 Disorder of bone, unspecified: Secondary | ICD-10-CM

## 2012-12-27 DIAGNOSIS — M8580 Other specified disorders of bone density and structure, unspecified site: Secondary | ICD-10-CM

## 2012-12-27 MED ORDER — PROGESTERONE MICRONIZED 100 MG PO CAPS: 100.0000 mg | ORAL_CAPSULE | Freq: Every day | ORAL | Status: DC

## 2012-12-27 NOTE — Progress Notes (Signed)
Patient presents for 2 reasons. First is to discuss her recent bone density and the second is to discuss her HRT. Her DEXA 11/2012 with T score -2.4 stable from her prior DEXA 2012. She had been on Forteo but has been off of this now for 2 years. Recommended that patient  continue with weight-bearing exercise, calcium and vitamin D and that we were peaked her DEXA in 2 years. Second issue is for his HRT. She is taking estradiol 0.5 mg and Provera 2.5 mg daily. She's noticing some hot flashes at night but also having reflux. I reviewed the whole issue of HRT with her to include the WHI study with increased risk of stroke, heart attack, DVT and breast cancer. The ACOG and NAMS statements for lowest dose for the shortest period of time reviewed. Transdermal versus oral first-pass effect benefit discussed. The patient wants to continue which think from a symptom relief standpoint is reasonable as well as bone health. I did suggest we try Minivelle 0.05 mg patches I gave her 2 weeks sample and Prometrium 100 mg at bedtime. She'll call me at the end of 2 weeks let me how she's doing and then we'll go from there.

## 2012-12-27 NOTE — Patient Instructions (Signed)
Start on estrogen patch as sampled. Take Prometrium 100 mg nightly. Call me in 2 weeks in followup as to how you're doing.

## 2013-01-06 ENCOUNTER — Telehealth: Payer: Self-pay | Admitting: *Deleted

## 2013-01-06 MED ORDER — MEDROXYPROGESTERONE ACETATE 2.5 MG PO TABS: 2.5000 mg | ORAL_TABLET | Freq: Every day | ORAL | Status: DC

## 2013-01-06 MED ORDER — ESTRADIOL 0.5 MG PO TABS: 0.5000 mg | ORAL_TABLET | Freq: Every day | ORAL | Status: DC

## 2013-01-06 NOTE — Telephone Encounter (Signed)
Okay to switch back if she wants to. Estradiol 0.5 mg and Provera 2.5 mg daily #30 each refill x1 year

## 2013-01-06 NOTE — Telephone Encounter (Signed)
Pt was seen on 12/27/12 given samples of Minivelle 0.05 mg patches & Prometrium 100 mg at bedtime. Pt said she doesn't think this may be working c/o headaches, breast pain, and arthritis pain. Pt thought may switch back to old Rx? Or other suggestions? Please advise

## 2013-01-06 NOTE — Telephone Encounter (Signed)
Pt informed, rx sent 

## 2013-01-21 ENCOUNTER — Ambulatory Visit: Payer: 59 | Admitting: Sports Medicine

## 2013-01-27 ENCOUNTER — Other Ambulatory Visit: Payer: Self-pay | Admitting: Gynecology

## 2013-01-27 DIAGNOSIS — R921 Mammographic calcification found on diagnostic imaging of breast: Secondary | ICD-10-CM

## 2013-02-24 ENCOUNTER — Other Ambulatory Visit: Payer: Self-pay | Admitting: Women's Health

## 2013-02-24 DIAGNOSIS — R921 Mammographic calcification found on diagnostic imaging of breast: Secondary | ICD-10-CM

## 2013-02-25 ENCOUNTER — Encounter: Payer: Self-pay | Admitting: Sports Medicine

## 2013-02-25 ENCOUNTER — Ambulatory Visit (INDEPENDENT_AMBULATORY_CARE_PROVIDER_SITE_OTHER): Payer: 59 | Admitting: Sports Medicine

## 2013-02-25 VITALS — BP 100/62 | Ht 69.0 in | Wt 135.0 lb

## 2013-02-25 DIAGNOSIS — M25372 Other instability, left ankle: Secondary | ICD-10-CM

## 2013-02-25 DIAGNOSIS — R269 Unspecified abnormalities of gait and mobility: Secondary | ICD-10-CM

## 2013-02-25 DIAGNOSIS — M24873 Other specific joint derangements of unspecified ankle, not elsewhere classified: Secondary | ICD-10-CM

## 2013-02-25 DIAGNOSIS — M722 Plantar fascial fibromatosis: Secondary | ICD-10-CM

## 2013-02-25 NOTE — Progress Notes (Signed)
  Subjective:    Patient ID: Yolanda Carroll, female    DOB: 1955-08-22, 57 y.o.   MRN: [id number]  HPI 57 yo female who presents for follow up of right plantar foot pain. Seen on 11/05/12 and given exercises and stretches and encouraged to wear custom orthotics.  Since then, no change. Pain is located in the posterior lateral aspect of the foot. Worst in the morning when standing. Worst with walking for longer periods of time. Has been using orthoheel sandals which feel very comfortable. Has not worn her sneakers with orthotics as frequently.  Does stretches 2-3 times per week.  She also reports starting to feel a little discomfort on the medial aspect of her right posterior foot on the plantar aspect for the last couple of weeks.    Review of Systems Negative except per HPI    Objective:   Physical Exam Filed Vitals:   02/25/13 1528  BP: 100/62  Height: 5\' 9"  (1.753 m)  Weight: 135 lb (61.236 kg)   General: no acute distress Foot exam: left foot: tenderness along posterior lateral plantar aspect of foot.  Shortened 5th ray bilaterally with splaying between the 1st and 2nd toes bilaterally.  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 This is likely contributing to the pain she is having on the lateral aspect of her hindfoot. This is different from a classic plantar fasciitis picture.   - for this reason, padding was added to the posterior lateral aspect of foot as well as lateral aspect close to 5th MTP. Central portion of orthotic was also thinned out for comfort.  On right foot, cushioning posts were added to the medial aspect in case of new onset plantarfasciitis on the right.  - dress shoes fitted with scaphoid pads.  - follow up in 1 month  Marena Chancy, PGY-3 Family Medicine Resident

## 2013-02-25 NOTE — Assessment & Plan Note (Signed)
While [place] did show PF most of her residual sxs are lateral heel  I think this comes from breakdown of lat foot particularly after 5th toe shortening surgery  We adjusted orthotics to provide lateral support

## 2013-02-25 NOTE — Assessment & Plan Note (Signed)
Gait is more neutral in orthotics  This is also improved in dress shoes with scaphoid pads  Cont to use those in regular shoes

## 2013-02-26 ENCOUNTER — Other Ambulatory Visit: Payer: Self-pay | Admitting: Women's Health

## 2013-02-26 ENCOUNTER — Ambulatory Visit
Admission: RE | Admit: 2013-02-26 | Discharge: 2013-02-26 | Disposition: A | Discharge status: Home / Self Care | Payer: 59 | Source: Ambulatory Visit | Attending: Women's Health | Admitting: Women's Health

## 2013-02-26 ENCOUNTER — Ambulatory Visit
Admission: RE | Admit: 2013-02-26 | Discharge: 2013-02-26 | Disposition: A | Discharge status: Home / Self Care | Payer: 59 | Source: Ambulatory Visit | Attending: Gynecology | Admitting: Gynecology

## 2013-02-26 DIAGNOSIS — R921 Mammographic calcification found on diagnostic imaging of breast: Secondary | ICD-10-CM

## 2013-03-20 ENCOUNTER — Other Ambulatory Visit: Payer: Self-pay

## 2013-05-05 ENCOUNTER — Encounter (INDEPENDENT_AMBULATORY_CARE_PROVIDER_SITE_OTHER): Payer: 59 | Admitting: General Surgery

## 2013-05-06 ENCOUNTER — Encounter (INDEPENDENT_AMBULATORY_CARE_PROVIDER_SITE_OTHER): Payer: 59 | Admitting: General Surgery

## 2013-05-30 ENCOUNTER — Other Ambulatory Visit: Payer: Self-pay | Admitting: Gynecology

## 2013-05-30 DIAGNOSIS — R921 Mammographic calcification found on diagnostic imaging of breast: Secondary | ICD-10-CM

## 2013-06-27 ENCOUNTER — Ambulatory Visit
Admission: RE | Admit: 2013-06-27 | Discharge: 2013-06-27 | Disposition: A | Discharge status: Home / Self Care | Payer: Self-pay | Source: Ambulatory Visit | Attending: Gynecology | Admitting: Gynecology

## 2013-06-27 DIAGNOSIS — R921 Mammographic calcification found on diagnostic imaging of breast: Secondary | ICD-10-CM

## 2013-07-08 ENCOUNTER — Other Ambulatory Visit: Payer: Self-pay | Admitting: Orthopaedic Surgery

## 2013-07-08 DIAGNOSIS — M545 Low back pain, unspecified: Secondary | ICD-10-CM

## 2013-07-09 ENCOUNTER — Telehealth: Payer: Self-pay | Admitting: Pulmonary Disease

## 2013-07-09 NOTE — Telephone Encounter (Signed)
[employer]. She was calling in reference to her mother not herself. Will sign off message

## 2013-07-11 ENCOUNTER — Other Ambulatory Visit: Payer: 59

## 2013-07-17 ENCOUNTER — Encounter: Payer: Self-pay | Admitting: Gynecology

## 2013-07-17 ENCOUNTER — Ambulatory Visit (INDEPENDENT_AMBULATORY_CARE_PROVIDER_SITE_OTHER): Payer: 59 | Admitting: Gynecology

## 2013-07-17 VITALS — BP 116/66 | Ht 67.0 in | Wt 134.0 lb

## 2013-07-17 DIAGNOSIS — N898 Other specified noninflammatory disorders of vagina: Secondary | ICD-10-CM

## 2013-07-17 DIAGNOSIS — N9489 Other specified conditions associated with female genital organs and menstrual cycle: Secondary | ICD-10-CM

## 2013-07-17 DIAGNOSIS — Z01419 Encounter for gynecological examination (general) (routine) without abnormal findings: Secondary | ICD-10-CM

## 2013-07-17 DIAGNOSIS — Z79890 Hormone replacement therapy (postmenopausal): Secondary | ICD-10-CM

## 2013-07-17 MED ORDER — MEDROXYPROGESTERONE ACETATE 2.5 MG PO TABS: 2.5000 mg | ORAL_TABLET | Freq: Every day | ORAL | Status: DC

## 2013-07-17 MED ORDER — ESTRADIOL 0.5 MG PO TABS: 0.5000 mg | ORAL_TABLET | Freq: Every day | ORAL | Status: DC

## 2013-07-17 NOTE — Progress Notes (Signed)
PHYILLIS Carroll 10/15/1955 [id number]        58 y.o.  G0P0 for annual exam.  Several issues and below.  Past medical history,surgical history, problem list, medications, allergies, family history and social history were all reviewed and documented in the EPIC chart.  ROS:  Performed and pertinent positives and negatives are included in the history, assessment and plan .  Exam: [name] assistant Filed Vitals:   07/17/13 1132  BP: 116/66  Height: 5\' 7"  (1.702 m)  Weight: 134 lb (60.782 kg)   General appearance  Normal Skin grossly normal Head/Neck normal with no cervical or supraclavicular adenopathy thyroid normal Lungs  clear Cardiac RR, without RMG Abdominal  soft, nontender, without masses, organomegaly or hernia Breasts  examined lying and sitting without masses, retractions, discharge or axillary adenopathy. Pelvic  Ext/BUS/vagina with generalized atrophic changes  Cervix atrophic  Uterus anteverted, normal size, shape and contour, midline and mobile nontender   Adnexa  Without masses or tenderness    Anus and perineum  Normal   Rectovaginal  Normal sphincter tone without palpated masses or tenderness.    Assessment/Plan:  58 y.o. G0P0 female for annual exam.   1. Postmenopausal/atrophic genital changes/menopausal symptoms/vaginal dryness. Patient had been on estradiol 0.5 mg and Provera 2.5 mg daily. Last year she tried switching over to the patch with Prometrium but did not like the way she felt and reinitiated her estradiol and Provera. She is taking 0.25 mg of estradiol and 2.5 mg of Provera and continues to have night sweats, vaginal dryness and dyspareunia. No vaginal bleeding for years. I again reviewed the whole issue of HRT with her to include the WHI study with increased risk of stroke, heart attack, DVT and breast cancer. The ACOG and NAMS statements for lowest dose for the shortest period of time reviewed. Transdermal versus oral first-pass effect benefit discussed.   Recommended patient increase her estradiol 2.5 mg along with her Provera 2.5 mg and see if this doesn't eliminate her hot flashes. She is to supplement vaginally with vaginal estrogen cream or Vagifem was also discussed. Patient wants a trial of Vagifem and I gave her 2 sample cards to use twice weekly and she'll call at the end of 6 weeks to let me know how she's doing for a refill. Patient knows to report any vaginal bleeding 2. Osteoporosis. DEXA 11/2012 T score -2.4 stable from her 2012 DEXA. Had been treated in the past with Acquanetta Sit and lastly with Forteo for 2 years although has been off of this now for 3 years. Plan is to repeat her DEXA 2016 at a two-year interval. Increase calcium vitamin D reviewed. 3. Pap smear 06/2011. No Pap smear done today. History of cryosurgery a number of years ago for unknown pathology with intervening Pap smears all normal. Plan repeat Pap smear next year at 3 year interval. 4. Mammography 06/2013. Continued annual mammography. SBE monthly reviewed. 5. Colonoscopy scheduled this coming month. 6. Health maintenance. No blood work done as this is all done through her primary physician's office. Followup with me for Vagifem response otherwise annually.   Note: This document was prepared with digital dictation and possible smart phrase technology. Any transcriptional errors that result from this process are unintentional.   [name] MD, 12:55 PM 07/17/2013

## 2013-07-17 NOTE — Patient Instructions (Signed)
Call me at the end of the month after the Vagifem trial to let me know how you are doing. Increase your estradiol 2.5 mg daily along with your Provera 2.5 mg daily. Followup if you have any issues at all.  You may obtain a copy of any labs that were done today by logging onto MyChart as outlined in the instructions provided with your AVS (after visit summary). The office will not call with normal lab results but certainly if there are any significant abnormalities then we will contact you.   Health Maintenance, Female A healthy lifestyle and preventative care can promote health and wellness.  Maintain regular health, dental, and eye exams.  Eat a healthy diet. Foods like vegetables, fruits, whole grains, low-fat dairy products, and lean protein foods contain the nutrients you need without too many calories. Decrease your intake of foods high in solid fats, added sugars, and salt. Get information about a proper diet from your caregiver, if necessary.  Regular physical exercise is one of the most important things you can do for your health. Most adults should get at least 150 minutes of moderate-intensity exercise (any activity that increases your heart rate and causes you to sweat) each week. In addition, most adults need muscle-strengthening exercises on 2 or more days a week.   Maintain a healthy weight. The body mass index (BMI) is a screening tool to identify possible weight problems. It provides an estimate of body fat based on height and weight. Your caregiver can help determine your BMI, and can help you achieve or maintain a healthy weight. For adults 20 years and older:  A BMI below 18.5 is considered underweight.  A BMI of 18.5 to 24.9 is normal.  A BMI of 25 to 29.9 is considered overweight.  A BMI of 30 and above is considered obese.  Maintain normal blood lipids and cholesterol by exercising and minimizing your intake of saturated fat. Eat a balanced diet with plenty of fruits  and vegetables. Blood tests for lipids and cholesterol should begin at age 44 and be repeated every 5 years. If your lipid or cholesterol levels are high, you are over 50, or you are a high risk for heart disease, you may need your cholesterol levels checked more frequently.Ongoing high lipid and cholesterol levels should be treated with medicines if diet and exercise are not effective.  If you smoke, find out from your caregiver how to quit. If you do not use tobacco, do not start.  Lung cancer screening is recommended for adults aged 81 80 years who are at high risk for developing lung cancer because of a history of smoking. Yearly low-dose computed tomography (CT) is recommended for people who have at least a 30-pack-year history of smoking and are a current smoker or have quit within the past 15 years. A pack year of smoking is smoking an average of 1 pack of cigarettes a day for 1 year (for example: 1 pack a day for 30 years or 2 packs a day for 15 years). Yearly screening should continue until the smoker has stopped smoking for at least 15 years. Yearly screening should also be stopped for people who develop a health problem that would prevent them from having lung cancer treatment.  If you are pregnant, do not drink alcohol. If you are breastfeeding, be very cautious about drinking alcohol. If you are not pregnant and choose to drink alcohol, do not exceed 1 drink per day. One drink is considered to be  12 ounces (355 mL) of beer, 5 ounces (148 mL) of wine, or 1.5 ounces (44 mL) of liquor.  Avoid use of street drugs. Do not share needles with anyone. Ask for help if you need support or instructions about stopping the use of drugs.  High blood pressure causes heart disease and increases the risk of stroke. Blood pressure should be checked at least every 1 to 2 years. Ongoing high blood pressure should be treated with medicines, if weight loss and exercise are not effective.  If you are 36 to 58  years old, ask your caregiver if you should take aspirin to prevent strokes.  Diabetes screening involves taking a blood sample to check your fasting blood sugar level. This should be done once every 3 years, after age 9, if you are within normal weight and without risk factors for diabetes. Testing should be considered at a younger age or be carried out more frequently if you are overweight and have at least 1 risk factor for diabetes.  Breast cancer screening is essential preventative care for women. You should practice "breast self-awareness." This means understanding the normal appearance and feel of your breasts and may include breast self-examination. Any changes detected, no matter how small, should be reported to a caregiver. Women in their 43s and 30s should have a clinical breast exam (CBE) by a caregiver as part of a regular health exam every 1 to 3 years. After age 13, women should have a CBE every year. Starting at age 33, women should consider having a mammogram (breast X-ray) every year. Women who have a family history of breast cancer should talk to their caregiver about genetic screening. Women at a high risk of breast cancer should talk to their caregiver about having an MRI and a mammogram every year.  Breast cancer gene (BRCA)-related cancer risk assessment is recommended for women who have family members with BRCA-related cancers. BRCA-related cancers include breast, ovarian, tubal, and peritoneal cancers. Having family members with these cancers may be associated with an increased risk for harmful changes (mutations) in the breast cancer genes BRCA1 and BRCA2. Results of the assessment will determine the need for genetic counseling and BRCA1 and BRCA2 testing.  The Pap test is a screening test for cervical cancer. Women should have a Pap test starting at age 35. Between ages 71 and 54, Pap tests should be repeated every 2 years. Beginning at age 74, you should have a Pap test every 3  years as long as the past 3 Pap tests have been normal. If you had a hysterectomy for a problem that was not cancer or a condition that could lead to cancer, then you no longer need Pap tests. If you are between ages 36 and 51, and you have had normal Pap tests going back 10 years, you no longer need Pap tests. If you have had past treatment for cervical cancer or a condition that could lead to cancer, you need Pap tests and screening for cancer for at least 20 years after your treatment. If Pap tests have been discontinued, risk factors (such as a new sexual partner) need to be reassessed to determine if screening should be resumed. Some women have medical problems that increase the chance of getting cervical cancer. In these cases, your caregiver may recommend more frequent screening and Pap tests.  The human papillomavirus (HPV) test is an additional test that may be used for cervical cancer screening. The HPV test looks for the virus that can  cause the cell changes on the cervix. The cells collected during the Pap test can be tested for HPV. The HPV test could be used to screen women aged 1 years and older, and should be used in women of any age who have unclear Pap test results. After the age of 69, women should have HPV testing at the same frequency as a Pap test.  Colorectal cancer can be detected and often prevented. Most routine colorectal cancer screening begins at the age of 37 and continues through age 109. However, your caregiver may recommend screening at an earlier age if you have risk factors for colon cancer. On a yearly basis, your caregiver may provide home test kits to check for hidden blood in the stool. Use of a small camera at the end of a tube, to directly examine the colon (sigmoidoscopy or colonoscopy), can detect the earliest forms of colorectal cancer. Talk to your caregiver about this at age 71, when routine screening begins. Direct examination of the colon should be repeated every 5  to 10 years through age 34, unless early forms of pre-cancerous polyps or small growths are found.  Hepatitis C blood testing is recommended for all people born from [birth year] through 1965 and any individual with known risks for hepatitis C.  Practice safe sex. Use condoms and avoid high-risk sexual practices to reduce the spread of sexually transmitted infections (STIs). Sexually active women aged 41 and younger should be checked for Chlamydia, which is a common sexually transmitted infection. Older women with new or multiple partners should also be tested for Chlamydia. Testing for other STIs is recommended if you are sexually active and at increased risk.  Osteoporosis is a disease in which the bones lose minerals and strength with aging. This can result in serious bone fractures. The risk of osteoporosis can be identified using a bone density scan. Women ages 109 and over and women at risk for fractures or osteoporosis should discuss screening with their caregivers. Ask your caregiver whether you should be taking a calcium supplement or vitamin D to reduce the rate of osteoporosis.  Menopause can be associated with physical symptoms and risks. Hormone replacement therapy is available to decrease symptoms and risks. You should talk to your caregiver about whether hormone replacement therapy is right for you.  Use sunscreen. Apply sunscreen liberally and repeatedly throughout the day. You should seek shade when your shadow is shorter than you. Protect yourself by wearing long sleeves, pants, a wide-brimmed hat, and sunglasses year round, whenever you are outdoors.  Notify your caregiver of new moles or changes in moles, especially if there is a change in shape or color. Also notify your caregiver if a mole is larger than the size of a pencil eraser.  Stay current with your immunizations. Document Released: 11/14/2010 Document Revised: 08/26/2012 Document Reviewed: 11/14/2010 [facility] Patient Information  2014 [place].

## 2013-07-18 LAB — URINALYSIS W MICROSCOPIC + REFLEX CULTURE
Bacteria, UA: NONE SEEN
Bilirubin Urine: NEGATIVE
Casts: NONE SEEN
Crystals: NONE SEEN
Glucose, UA: NEGATIVE mg/dL
Hgb urine dipstick: NEGATIVE
Ketones, ur: 15 mg/dL — AB
Leukocytes, UA: NEGATIVE
Nitrite: NEGATIVE
Protein, ur: NEGATIVE mg/dL
Specific Gravity, Urine: 1.016 (ref 1.005–1.030)
Squamous Epithelial / HPF: NONE SEEN
Urobilinogen, UA: 0.2 mg/dL (ref 0.0–1.0)
pH: 6 (ref 5.0–8.0)

## 2013-07-19 ENCOUNTER — Ambulatory Visit
Admission: RE | Admit: 2013-07-19 | Discharge: 2013-07-19 | Disposition: A | Discharge status: Home / Self Care | Payer: 59 | Source: Ambulatory Visit | Attending: Orthopaedic Surgery | Admitting: Orthopaedic Surgery

## 2013-07-19 DIAGNOSIS — M545 Low back pain, unspecified: Secondary | ICD-10-CM

## 2013-07-22 ENCOUNTER — Other Ambulatory Visit: Payer: Self-pay | Admitting: Gastroenterology

## 2014-06-23 ENCOUNTER — Other Ambulatory Visit: Payer: Self-pay

## 2014-06-23 DIAGNOSIS — Z1231 Encounter for screening mammogram for malignant neoplasm of breast: Secondary | ICD-10-CM

## 2014-07-03 ENCOUNTER — Ambulatory Visit
Admission: RE | Admit: 2014-07-03 | Discharge: 2014-07-03 | Disposition: A | Discharge status: Home / Self Care | Payer: 59 | Source: Ambulatory Visit

## 2014-07-03 DIAGNOSIS — Z1231 Encounter for screening mammogram for malignant neoplasm of breast: Secondary | ICD-10-CM

## 2014-07-20 ENCOUNTER — Other Ambulatory Visit: Payer: Self-pay | Admitting: Gynecology

## 2014-08-05 ENCOUNTER — Other Ambulatory Visit (HOSPITAL_COMMUNITY)
Admission: RE | Admit: 2014-08-05 | Discharge: 2014-08-05 | Disposition: A | Discharge status: Home / Self Care | Payer: 59 | Source: Ambulatory Visit | Attending: Gynecology | Admitting: Gynecology

## 2014-08-05 ENCOUNTER — Ambulatory Visit (INDEPENDENT_AMBULATORY_CARE_PROVIDER_SITE_OTHER): Payer: 59 | Admitting: Gynecology

## 2014-08-05 ENCOUNTER — Encounter: Payer: Self-pay | Admitting: Gynecology

## 2014-08-05 VITALS — BP 110/80 | Ht 67.0 in | Wt 141.0 lb

## 2014-08-05 DIAGNOSIS — Z01419 Encounter for gynecological examination (general) (routine) without abnormal findings: Secondary | ICD-10-CM

## 2014-08-05 DIAGNOSIS — M810 Age-related osteoporosis without current pathological fracture: Secondary | ICD-10-CM | POA: Diagnosis not present

## 2014-08-05 DIAGNOSIS — Z79890 Hormone replacement therapy (postmenopausal): Secondary | ICD-10-CM | POA: Diagnosis not present

## 2014-08-05 DIAGNOSIS — N952 Postmenopausal atrophic vaginitis: Secondary | ICD-10-CM | POA: Diagnosis not present

## 2014-08-05 MED ORDER — ESTRADIOL 10 MCG VA TABS: 1.0000 | ORAL_TABLET | VAGINAL | Status: DC

## 2014-08-05 MED ORDER — ESTRADIOL 0.5 MG PO TABS: ORAL_TABLET | ORAL | Status: DC

## 2014-08-05 MED ORDER — MEDROXYPROGESTERONE ACETATE 2.5 MG PO TABS: ORAL_TABLET | ORAL | Status: DC

## 2014-08-05 NOTE — Progress Notes (Signed)
Yolanda Carroll [birth year] [id number]        59 y.o.  G0P0 for annual exam.  Several issues noted below.  Past medical history,surgical history, problem list, medications, allergies, family history and social history were all reviewed and documented as reviewed in the EPIC chart.  ROS:  Performed with pertinent positives and negatives included in the history, assessment and plan.   Additional significant findings :  none   Exam: Yolanda Carroll Vitals:   08/05/14 0944  BP: 110/80  Height: 5\' 7"  (1.702 m)  Weight: 141 lb (63.957 kg)   General appearance:  Normal affect, orientation and appearance. Skin: Grossly normal HEENT: Without gross lesions.  No cervical or supraclavicular adenopathy. Thyroid normal.  Lungs:  Clear without wheezing, rales or rhonchi Cardiac: RR, without RMG Abdominal:  Soft, nontender, without masses, guarding, rebound, organomegaly or hernia Breasts:  Examined lying and sitting without masses, retractions, discharge or axillary adenopathy. Pelvic:  Ext/BUS/vagina with atrophic changes  Cervix with atrophic changes. Pap smear done  Uterus anteverted, normal size, shape and contour, midline and mobile nontender   Adnexa  Without masses or tenderness    Anus and perineum  Normal   Rectovaginal  Normal sphincter tone without palpated masses or tenderness.    Assessment/Plan:  59 y.o. G0P0 female for annual exam.   1. Postmenopausal/atrophic genital changes/HRT.  Patient continues on estradiol 0.5 mg and Provera 2.5 mg daily. Had been on vaginal estrogen but stopped it. Notes vaginal dryness and discomfort with intercourse now. No vaginal bleeding. Wants to continue on HRT.  I again reviewed the whole issue of HRT with her to include the WHI study with increased risk of stroke, heart attack, DVT and breast cancer. The ACOG and NAMS statements for lowest dose for the shortest period of time reviewed. The issue of when to wean reviewed. At this point  the patient wants to continue on accepts risks and I refilled her 1 year. She does want to go ahead and start Vagifem again. Vagifem 10 g twice weekly 1 year also provided.  Call if any bleeding or other issues. 2. Osteoporosis.  DEXA 11/2012 T score -2.4. Stable from 2012 DEXA. History of Acquanetta Sit and Forteo for 2 years. Off for 4 years. Schedule DEXA end of this summer. Increased calcium vitamin D reviewed. 3. Pap smear 2013. Pap smear done today. No history of abnormal Pap smears previously. 4. Mammography 06/2014. Continue with annual mammography. SBE monthly reviewed. 5. Colonoscopy 8 years ago with reported repeat interval 10 years. 6. Health maintenance. No routine blood work done as she has this done at her primary physician's office. Follow up for bone density otherwise annually, sooner if any issues.     [name] MD, 10:19 AM 08/05/2014

## 2014-08-05 NOTE — Addendum Note (Signed)
Addended by: Burnett Kanaris on: 08/05/2014 10:50 AM   Modules accepted: Orders

## 2014-08-05 NOTE — Patient Instructions (Signed)
Follow up for bone density as scheduled.  You may obtain a copy of any labs that were done today by logging onto MyChart as outlined in the instructions provided with your AVS (after visit summary). The office will not call with normal lab results but certainly if there are any significant abnormalities then we will contact you.   Health Maintenance, Female A healthy lifestyle and preventative care can promote health and wellness.  Maintain regular health, dental, and eye exams.  Eat a healthy diet. Foods like vegetables, fruits, whole grains, low-fat dairy products, and lean protein foods contain the nutrients you need without too many calories. Decrease your intake of foods high in solid fats, added sugars, and salt. Get information about a proper diet from your caregiver, if necessary.  Regular physical exercise is one of the most important things you can do for your health. Most adults should get at least 150 minutes of moderate-intensity exercise (any activity that increases your heart rate and causes you to sweat) each week. In addition, most adults need muscle-strengthening exercises on 2 or more days a week.   Maintain a healthy weight. The body mass index (BMI) is a screening tool to identify possible weight problems. It provides an estimate of body fat based on height and weight. Your caregiver can help determine your BMI, and can help you achieve or maintain a healthy weight. For adults 20 years and older:  A BMI below 18.5 is considered underweight.  A BMI of 18.5 to 24.9 is normal.  A BMI of 25 to 29.9 is considered overweight.  A BMI of 30 and above is considered obese.  Maintain normal blood lipids and cholesterol by exercising and minimizing your intake of saturated fat. Eat a balanced diet with plenty of fruits and vegetables. Blood tests for lipids and cholesterol should begin at age 20 and be repeated every 5 years. If your lipid or cholesterol levels are high, you are over  50, or you are a high risk for heart disease, you may need your cholesterol levels checked more frequently.Ongoing high lipid and cholesterol levels should be treated with medicines if diet and exercise are not effective.  If you smoke, find out from your caregiver how to quit. If you do not use tobacco, do not start.  Lung cancer screening is recommended for adults aged 55 80 years who are at high risk for developing lung cancer because of a history of smoking. Yearly low-dose computed tomography (CT) is recommended for people who have at least a 30-pack-year history of smoking and are a current smoker or have quit within the past 15 years. A pack year of smoking is smoking an average of 1 pack of cigarettes a day for 1 year (for example: 1 pack a day for 30 years or 2 packs a day for 15 years). Yearly screening should continue until the smoker has stopped smoking for at least 15 years. Yearly screening should also be stopped for people who develop a health problem that would prevent them from having lung cancer treatment.  If you are pregnant, do not drink alcohol. If you are breastfeeding, be very cautious about drinking alcohol. If you are not pregnant and choose to drink alcohol, do not exceed 1 drink per day. One drink is considered to be 12 ounces (355 mL) of beer, 5 ounces (148 mL) of wine, or 1.5 ounces (44 mL) of liquor.  Avoid use of street drugs. Do not share needles with anyone. Ask for help   if you need support or instructions about stopping the use of drugs.  High blood pressure causes heart disease and increases the risk of stroke. Blood pressure should be checked at least every 1 to 2 years. Ongoing high blood pressure should be treated with medicines, if weight loss and exercise are not effective.  If you are 55 to 59 years old, ask your caregiver if you should take aspirin to prevent strokes.  Diabetes screening involves taking a blood sample to check your fasting blood sugar level.  This should be done once every 3 years, after age 45, if you are within normal weight and without risk factors for diabetes. Testing should be considered at a younger age or be carried out more frequently if you are overweight and have at least 1 risk factor for diabetes.  Breast cancer screening is essential preventative care for women. You should practice "breast self-awareness." This means understanding the normal appearance and feel of your breasts and may include breast self-examination. Any changes detected, no matter how small, should be reported to a caregiver. Women in their 20s and 30s should have a clinical breast exam (CBE) by a caregiver as part of a regular health exam every 1 to 3 years. After age 40, women should have a CBE every year. Starting at age 40, women should consider having a mammogram (breast X-ray) every year. Women who have a family history of breast cancer should talk to their caregiver about genetic screening. Women at a high risk of breast cancer should talk to their caregiver about having an MRI and a mammogram every year.  Breast cancer gene (BRCA)-related cancer risk assessment is recommended for women who have family members with BRCA-related cancers. BRCA-related cancers include breast, ovarian, tubal, and peritoneal cancers. Having family members with these cancers may be associated with an increased risk for harmful changes (mutations) in the breast cancer genes BRCA1 and BRCA2. Results of the assessment will determine the need for genetic counseling and BRCA1 and BRCA2 testing.  The Pap test is a screening test for cervical cancer. Women should have a Pap test starting at age 21. Between ages 21 and 29, Pap tests should be repeated every 2 years. Beginning at age 30, you should have a Pap test every 3 years as long as the past 3 Pap tests have been normal. If you had a hysterectomy for a problem that was not cancer or a condition that could lead to cancer, then you no  longer need Pap tests. If you are between ages 65 and 70, and you have had normal Pap tests going back 10 years, you no longer need Pap tests. If you have had past treatment for cervical cancer or a condition that could lead to cancer, you need Pap tests and screening for cancer for at least 20 years after your treatment. If Pap tests have been discontinued, risk factors (such as a new sexual partner) need to be reassessed to determine if screening should be resumed. Some women have medical problems that increase the chance of getting cervical cancer. In these cases, your caregiver may recommend more frequent screening and Pap tests.  The human papillomavirus (HPV) test is an additional test that may be used for cervical cancer screening. The HPV test looks for the virus that can cause the cell changes on the cervix. The cells collected during the Pap test can be tested for HPV. The HPV test could be used to screen women aged 30 years and older, and   should be used in women of any age who have unclear Pap test results. After the age of 30, women should have HPV testing at the same frequency as a Pap test.  Colorectal cancer can be detected and often prevented. Most routine colorectal cancer screening begins at the age of 50 and continues through age 75. However, your caregiver may recommend screening at an earlier age if you have risk factors for colon cancer. On a yearly basis, your caregiver may provide home test kits to check for hidden blood in the stool. Use of a small camera at the end of a tube, to directly examine the colon (sigmoidoscopy or colonoscopy), can detect the earliest forms of colorectal cancer. Talk to your caregiver about this at age 50, when routine screening begins. Direct examination of the colon should be repeated every 5 to 10 years through age 75, unless early forms of pre-cancerous polyps or small growths are found.  Hepatitis C blood testing is recommended for all people born from  1945 through 1965 and any individual with known risks for hepatitis C.  Practice safe sex. Use condoms and avoid high-risk sexual practices to reduce the spread of sexually transmitted infections (STIs). Sexually active women aged 25 and younger should be checked for Chlamydia, which is a common sexually transmitted infection. Older women with new or multiple partners should also be tested for Chlamydia. Testing for other STIs is recommended if you are sexually active and at increased risk.  Osteoporosis is a disease in which the bones lose minerals and strength with aging. This can result in serious bone fractures. The risk of osteoporosis can be identified using a bone density scan. Women ages 65 and over and women at risk for fractures or osteoporosis should discuss screening with their caregivers. Ask your caregiver whether you should be taking a calcium supplement or vitamin D to reduce the rate of osteoporosis.  Menopause can be associated with physical symptoms and risks. Hormone replacement therapy is available to decrease symptoms and risks. You should talk to your caregiver about whether hormone replacement therapy is right for you.  Use sunscreen. Apply sunscreen liberally and repeatedly throughout the day. You should seek shade when your shadow is shorter than you. Protect yourself by wearing long sleeves, pants, a wide-brimmed hat, and sunglasses year round, whenever you are outdoors.  Notify your caregiver of new moles or changes in moles, especially if there is a change in shape or color. Also notify your caregiver if a mole is larger than the size of a pencil eraser.  Stay current with your immunizations. Document Released: 11/14/2010 Document Revised: 08/26/2012 Document Reviewed: 11/14/2010 [employer] Patient Information 2014 [employer].   

## 2014-08-06 LAB — URINALYSIS W MICROSCOPIC + REFLEX CULTURE
Bacteria, UA: NONE SEEN
Bilirubin Urine: NEGATIVE
Casts: NONE SEEN
Crystals: NONE SEEN
Glucose, UA: NEGATIVE mg/dL
Hgb urine dipstick: NEGATIVE
Ketones, ur: NEGATIVE mg/dL
Leukocytes, UA: NEGATIVE
Nitrite: NEGATIVE
Protein, ur: NEGATIVE mg/dL
Specific Gravity, Urine: 1.018 (ref 1.005–1.030)
Squamous Epithelial / LPF: NONE SEEN
Urobilinogen, UA: 0.2 mg/dL (ref 0.0–1.0)
pH: 6 (ref 5.0–8.0)

## 2014-08-06 LAB — CYTOLOGY - PAP

## 2014-10-05 ENCOUNTER — Encounter: Payer: Self-pay | Admitting: Sports Medicine

## 2014-10-07 ENCOUNTER — Ambulatory Visit: Payer: 59 | Admitting: Sports Medicine

## 2014-11-12 ENCOUNTER — Encounter: Payer: Self-pay | Admitting: Sports Medicine

## 2014-11-12 ENCOUNTER — Ambulatory Visit (INDEPENDENT_AMBULATORY_CARE_PROVIDER_SITE_OTHER): Payer: 59 | Admitting: Sports Medicine

## 2014-11-12 VITALS — BP 106/55 | HR 63 | Ht 66.0 in | Wt 136.0 lb

## 2014-11-12 DIAGNOSIS — M722 Plantar fascial fibromatosis: Secondary | ICD-10-CM

## 2014-11-12 NOTE — Assessment & Plan Note (Signed)
ARch strap Begin PF stretches Calf raises Ice  Modify orthotic with a heel lift with medial cutout on RT Use orthotics when possible or other shoes with good arch  Reck pending response in 2 mos

## 2014-11-12 NOTE — Progress Notes (Signed)
Patient ID: Yolanda Carroll, female   DOB: 1956-02-08, 59 y.o.   MRN: [id number]  Patient with Hx of left PF that we treated  Took 18 mos to resolve US showed marked thickening  Now with RT PF sxs for 3 to 4 mos Walks for exercise Was not sure of a specific trigger Morning pain along medial heel Stretches helps some We made her custom orthotics and they help some  Exam NAD BP 106/55 mmHg  Pulse 63  Ht 5\' 6"  (1.676 m)  Wt 136 lb (61.689 kg)  BMI 21.96 kg/m2  RT Thin foot w high arch TTP along med insertion of PF Good motion of grt toe Ankle exam is norm  Stretches she demonstrates are for AT not PF  RT foot [place] PF is now 0.63 thickness vs 0.37 in 2014 There is hypoechoic change at insertion PF medially No spurring

## 2014-12-29 ENCOUNTER — Ambulatory Visit (INDEPENDENT_AMBULATORY_CARE_PROVIDER_SITE_OTHER): Payer: 59

## 2014-12-29 DIAGNOSIS — M810 Age-related osteoporosis without current pathological fracture: Secondary | ICD-10-CM | POA: Diagnosis not present

## 2014-12-31 ENCOUNTER — Encounter: Payer: Self-pay | Admitting: Gynecology

## 2015-05-25 ENCOUNTER — Other Ambulatory Visit: Payer: Self-pay | Admitting: Rehabilitation

## 2015-05-25 DIAGNOSIS — M47896 Other spondylosis, lumbar region: Secondary | ICD-10-CM

## 2015-05-27 ENCOUNTER — Ambulatory Visit
Admission: RE | Admit: 2015-05-27 | Discharge: 2015-05-27 | Disposition: A | Discharge status: Home / Self Care | Payer: 59 | Source: Ambulatory Visit | Attending: Rehabilitation | Admitting: Rehabilitation

## 2015-05-27 DIAGNOSIS — M47896 Other spondylosis, lumbar region: Secondary | ICD-10-CM

## 2015-06-18 ENCOUNTER — Other Ambulatory Visit: Payer: Self-pay

## 2015-06-18 DIAGNOSIS — Z1231 Encounter for screening mammogram for malignant neoplasm of breast: Secondary | ICD-10-CM

## 2015-07-23 ENCOUNTER — Ambulatory Visit
Admission: RE | Admit: 2015-07-23 | Discharge: 2015-07-23 | Disposition: A | Discharge status: Home / Self Care | Payer: 59 | Source: Ambulatory Visit

## 2015-07-23 DIAGNOSIS — Z1231 Encounter for screening mammogram for malignant neoplasm of breast: Secondary | ICD-10-CM

## 2015-08-04 ENCOUNTER — Encounter: Payer: 59 | Admitting: Gynecology

## 2015-08-06 ENCOUNTER — Encounter: Payer: 59 | Admitting: Gynecology

## 2015-08-25 ENCOUNTER — Encounter: Payer: Self-pay | Admitting: Gynecology

## 2015-08-25 ENCOUNTER — Ambulatory Visit (INDEPENDENT_AMBULATORY_CARE_PROVIDER_SITE_OTHER): Payer: 59 | Admitting: Gynecology

## 2015-08-25 VITALS — BP 112/60 | Ht 67.0 in | Wt 135.0 lb

## 2015-08-25 DIAGNOSIS — M810 Age-related osteoporosis without current pathological fracture: Secondary | ICD-10-CM | POA: Diagnosis not present

## 2015-08-25 DIAGNOSIS — Z79890 Hormone replacement therapy (postmenopausal): Secondary | ICD-10-CM

## 2015-08-25 DIAGNOSIS — Z01419 Encounter for gynecological examination (general) (routine) without abnormal findings: Secondary | ICD-10-CM

## 2015-08-25 DIAGNOSIS — N952 Postmenopausal atrophic vaginitis: Secondary | ICD-10-CM | POA: Diagnosis not present

## 2015-08-25 MED ORDER — ESTRADIOL 10 MCG VA TABS: 1.0000 | ORAL_TABLET | VAGINAL | Status: DC

## 2015-08-25 MED ORDER — ESTRADIOL 0.5 MG PO TABS: ORAL_TABLET | ORAL | Status: DC

## 2015-08-25 MED ORDER — MEDROXYPROGESTERONE ACETATE 2.5 MG PO TABS: ORAL_TABLET | ORAL | Status: DC

## 2015-08-25 NOTE — Progress Notes (Signed)
    Yolanda Carroll 03/18/1956 [id number]        60 y.o.  G0P0  for annual exam.  Several issues noted below.  Past medical history,surgical history, problem list, medications, allergies, family history and social history were all reviewed and documented as reviewed in the EPIC chart.  ROS:  Performed with pertinent positives and negatives included in the history, assessment and plan.   Additional significant findings :  none   Exam: [name] assistant Filed Vitals:   08/25/15 1531  BP: 112/60  Height: 5\' 7"  (1.702 m)  Weight: 135 lb (61.236 kg)   General appearance:  Normal affect, orientation and appearance. Skin: Grossly normal HEENT: Without gross lesions.  No cervical or supraclavicular adenopathy. Thyroid normal.  Lungs:  Clear without wheezing, rales or rhonchi Cardiac: RR, without RMG Abdominal:  Soft, nontender, without masses, guarding, rebound, organomegaly or hernia Breasts:  Examined lying and sitting without masses, retractions, discharge or axillary adenopathy. Pelvic:  Ext/BUS/vagina with atrophic changes  Cervix with atrophic changes  Uterus anteverted, normal size, shape and contour, midline and mobile nontender   Adnexa without masses or tenderness    Anus and perineum normal   Rectovaginal normal sphincter tone without palpated masses or tenderness.    Assessment/Plan:  60 y.o. G0P0 female for annual exam.   1. Postmenopausal/atrophic genital changes. Continues on Estrace 0.5 mg and Provera 2.5 mg daily.  No bleeding. I again review the whole issue of HRT and risks to include stroke heart attack DVT possible breast cancer. She does have an issue with osteoporosis and is hopefully achieving benefit from the HRT. Options to wean now versus continue reviewed and at this point patient wants to continue and I refilled 1 year. Patient knows to call with any vaginal bleeding. Has not been using Vagifem consistently and is having an issue with dryness. She is  going to be more consistent twice-weekly and refill 1 year provided. We'll see if this does not eliminate her dryness. If not then she'll represent for further discussion and treatment. 2. Osteoporosis.  Most recent DEXA 12/2014 T score -2.6 stable from prior DEXA.  Does have history of Acquanetta Sit, Forteo use. She's been off of this now for 5 years. We'll plan expectant management for now, continue her HRT in follow up DEXA in 2 years.  Recent vitamin D at her primary physician's office was 58. TSH was normal. 3. Pap smear 07/2014. No Pap smear done today. No history of significant abnormal Pap smears. Plan repeat at 3 year interval. 4. Colonoscopy 2016. Repeat at their recommended interval. 5. Mammography 07/2015. Continue with annual mammography when due. SBE monthly reviewed. 6. Health maintenance. Patient brought copies of her lab work from recent visit with her primary. Follow up in one year, sooner as needed.   [name] MD, 4:15 PM 08/25/2015

## 2015-08-25 NOTE — Patient Instructions (Signed)

## 2016-03-21 ENCOUNTER — Ambulatory Visit (INDEPENDENT_AMBULATORY_CARE_PROVIDER_SITE_OTHER): Payer: 59 | Admitting: Women's Health

## 2016-03-21 ENCOUNTER — Encounter: Payer: Self-pay | Admitting: Women's Health

## 2016-03-21 VITALS — BP 120/78 | Ht 67.0 in | Wt 135.0 lb

## 2016-03-21 DIAGNOSIS — B373 Candidiasis of vulva and vagina: Secondary | ICD-10-CM | POA: Diagnosis not present

## 2016-03-21 DIAGNOSIS — B3731 Acute candidiasis of vulva and vagina: Secondary | ICD-10-CM

## 2016-03-21 DIAGNOSIS — R350 Frequency of micturition: Secondary | ICD-10-CM | POA: Diagnosis not present

## 2016-03-21 LAB — URINALYSIS W MICROSCOPIC + REFLEX CULTURE
Bacteria, UA: NONE SEEN [HPF]
Bilirubin Urine: NEGATIVE
Casts: NONE SEEN [LPF]
Crystals: NONE SEEN [HPF]
Glucose, UA: NEGATIVE
Hgb urine dipstick: NEGATIVE
Ketones, ur: NEGATIVE
Leukocytes, UA: NEGATIVE
Nitrite: NEGATIVE
Protein, ur: NEGATIVE
RBC / HPF: NONE SEEN RBC/HPF (ref <=2)
Specific Gravity, Urine: <1.005 (ref 1.001–1.035)
WBC, UA: NONE SEEN WBC/HPF (ref <=5)
Yeast: NONE SEEN [HPF]
pH: 5.5 (ref 5.0–8.0)

## 2016-03-21 LAB — WET PREP FOR TRICH, YEAST, CLUE
Clue Cells Wet Prep HPF POC: NONE SEEN
Trich, Wet Prep: NONE SEEN
Yeast Wet Prep HPF POC: NONE SEEN

## 2016-03-21 MED ORDER — TERCONAZOLE 0.4 % VA CREA: 1.0000 | TOPICAL_CREAM | Freq: Every day | VAGINAL | 0 refills | Status: DC

## 2016-03-21 MED ORDER — FLUCONAZOLE 100 MG PO TABS: ORAL_TABLET | ORAL | 0 refills | Status: DC

## 2016-03-21 NOTE — Progress Notes (Signed)
Presents with complaint of vaginal itching and burning for the past 3 days which have intensified. Symptoms started after intercourse. History of recurrent yeast has done better over the last few years. Denies any urinary symptoms of pain, burning or frequency, no abdominal pain or fever. Postmenopausal on HRT with no bleeding.   Exam: Appears well. Abdomen soft nontender, external genitalia erythematous and at introitus, speculum exam vaginal atrophy with minimal discharge and minimal erythema. Wet prep negative. Bimanual no CMT or adnexal tenderness. UA: Negative  Clinical yeast vaginitis Vaginal atrophy  Plan: Options reviewed, will try Diflucan 100 by mouth daily for 3 days and then when necessary. Prescription, proper use given. Vaginal estrogen, will try Vagifem one applicator at bedtime for 2 weeks and then twice weekly thereafter. Reviewed minimal systemic absorption. Iinstructed to call if no relief of symptoms. Continue vaginal lubricants with intercourse, aware of yeast prevention.

## 2016-03-21 NOTE — Patient Instructions (Signed)

## 2016-05-16 ENCOUNTER — Telehealth: Payer: Self-pay | Admitting: *Deleted

## 2016-05-16 ENCOUNTER — Encounter: Payer: Self-pay | Admitting: Gynecology

## 2016-05-16 ENCOUNTER — Ambulatory Visit (INDEPENDENT_AMBULATORY_CARE_PROVIDER_SITE_OTHER): Payer: 59 | Admitting: Gynecology

## 2016-05-16 VITALS — BP 114/64

## 2016-05-16 DIAGNOSIS — N952 Postmenopausal atrophic vaginitis: Secondary | ICD-10-CM

## 2016-05-16 LAB — WET PREP FOR TRICH, YEAST, CLUE
Clue Cells Wet Prep HPF POC: NONE SEEN
Trich, Wet Prep: NONE SEEN
WBC, Wet Prep HPF POC: NONE SEEN
Yeast Wet Prep HPF POC: NONE SEEN

## 2016-05-16 MED ORDER — NONFORMULARY OR COMPOUNDED ITEM: 3 refills | Status: DC

## 2016-05-16 NOTE — Telephone Encounter (Signed)
Rx called in 

## 2016-05-16 NOTE — Patient Instructions (Signed)
Use the vaginal estrogen cream 3 times weekly from [facility]. Call if you do not notice improvement within the month.

## 2016-05-16 NOTE — Telephone Encounter (Signed)
-----   Message from [name], MD sent at 05/16/2016  9:15 AM EST ----- [facility] into [facility] vaginal estradiol prefilled syringe. Use 3 times weekly dispense three-month supply refill 3

## 2016-05-16 NOTE — Progress Notes (Signed)
    Yolanda Carroll [birth year] [id number]        61 y.o.  G0P0 presents complaining of vaginal dryness. Is on estradiol 0.5 mg and Provera 2.5 mg daily. Using Vagifem twice weekly. Initially did well but was switched to the generic and has noticed is not working as well. Is having daily vaginal dryness and pain with intercourse. No discharge or itching.  Past medical history,surgical history, problem list, medications, allergies, family history and social history were all reviewed and documented in the EPIC chart.  Directed ROS with pertinent positives and negatives documented in the history of present illness/assessment and plan.  Exam: Yolanda Carroll assistant Vitals:   05/16/16 0902  BP: 114/64   General appearance:  Normal External BUS vagina with atrophic changes. Cervix with atrophic changes. Uterus normal size midline mobile nontender. Adnexa without masses or tenderness  Assessment/Plan:  61 y.o. G0P0 with vaginal dryness and dyspareunia. Wet prep is negative for infection. Discussed options to include increasing her oral estrogen dose or switching to a different vaginal estrogen such as estrogen cream. Patient wants to go ahead and try this. Will use formulated estradiol 3 times weekly [facility]. Patient will call if does not notice improvement after one month. Will decrease to twice weekly after the first month.    [name] MD, 9:16 AM 05/16/2016

## 2016-05-16 NOTE — Addendum Note (Signed)
Addended by: Nelva Nay on: 05/16/2016 12:16 PM   Modules accepted: Orders

## 2016-06-13 ENCOUNTER — Other Ambulatory Visit: Payer: Self-pay | Admitting: Gynecology

## 2016-06-13 DIAGNOSIS — Z1231 Encounter for screening mammogram for malignant neoplasm of breast: Secondary | ICD-10-CM

## 2016-07-12 ENCOUNTER — Other Ambulatory Visit: Payer: Self-pay | Admitting: Gynecology

## 2016-07-12 ENCOUNTER — Ambulatory Visit (INDEPENDENT_AMBULATORY_CARE_PROVIDER_SITE_OTHER): Payer: 59 | Admitting: Gynecology

## 2016-07-12 ENCOUNTER — Encounter: Payer: Self-pay | Admitting: Gynecology

## 2016-07-12 VITALS — BP 118/76

## 2016-07-12 DIAGNOSIS — N3000 Acute cystitis without hematuria: Secondary | ICD-10-CM | POA: Diagnosis not present

## 2016-07-12 DIAGNOSIS — D2261 Melanocytic nevi of right upper limb, including shoulder: Secondary | ICD-10-CM | POA: Diagnosis not present

## 2016-07-12 DIAGNOSIS — D225 Melanocytic nevi of trunk: Secondary | ICD-10-CM | POA: Diagnosis not present

## 2016-07-12 DIAGNOSIS — N898 Other specified noninflammatory disorders of vagina: Secondary | ICD-10-CM

## 2016-07-12 DIAGNOSIS — D2262 Melanocytic nevi of left upper limb, including shoulder: Secondary | ICD-10-CM | POA: Diagnosis not present

## 2016-07-12 LAB — WET PREP FOR TRICH, YEAST, CLUE
Clue Cells Wet Prep HPF POC: NONE SEEN
Trich, Wet Prep: NONE SEEN
WBC, Wet Prep HPF POC: NONE SEEN
Yeast Wet Prep HPF POC: NONE SEEN

## 2016-07-12 LAB — URINALYSIS W MICROSCOPIC + REFLEX CULTURE
Bilirubin Urine: NEGATIVE
Casts: NONE SEEN [LPF]
Crystals: NONE SEEN [HPF]
Glucose, UA: NEGATIVE
Ketones, ur: NEGATIVE
Nitrite: NEGATIVE
Protein, ur: NEGATIVE
Specific Gravity, Urine: <1.005 (ref 1.001–1.035)
Yeast: NONE SEEN [HPF]
pH: 5 (ref 5.0–8.0)

## 2016-07-12 MED ORDER — SULFAMETHOXAZOLE-TRIMETHOPRIM 800-160 MG PO TABS: 1.0000 | ORAL_TABLET | Freq: Two times a day (BID) | ORAL | 0 refills | Status: DC

## 2016-07-12 NOTE — Patient Instructions (Signed)
Take the antibiotic twice daily for 3 days. Follow up if your symptoms persist, worsen or recur.  Try the vaginal estrogen cream during the day and follow up if your symptoms continue.

## 2016-07-12 NOTE — Progress Notes (Signed)
    Yolanda Carroll [birth year] [id number]        61 y.o.  G0P0 presents awaking this morning and having frequency and dysuria. Some mild urgency. No low back pain fever or chills. No nausea vomiting diarrhea constipation. Had some issues with on and off vaginal discharge last week to 2 weeks but then used some cream that she had at home left over from a previous yeast treatment and her symptoms seem to have resolved. She does note after starting the estradiol vaginal cream when she uses it at bedtime she notes that she gets itchy in her chest although no overt rash and also just feels restless.  Past medical history,surgical history, problem list, medications, allergies, family history and social history were all reviewed and documented in the EPIC chart.  Directed ROS with pertinent positives and negatives documented in the history of present illness/assessment and plan.  Exam: [name] assistant Vitals:   07/12/16 1501  BP: 118/76   General appearance:  Normal Spine straight without CVA tenderness Abdomen soft nontender without masses guarding rebound External BUS vagina with atrophic changes. Scant white discharge noted. Bimanual without masses or tenderness.  Assessment/Plan:  61 y.o. G0P0 with history consistent with UTI. Urinalysis shows 20-40 WBC 20-40 RBC moderate bacteria. Will treat with Septra DS 1 by mouth twice a day 3 days. Follow up if symptoms persist, worsen or recur. Wet prep is negative. Will monitor symptoms and if symptoms return them will follow up for reevaluation. Follow up for scheduled annual exam in 2 months.    [name] MD, 3:16 PM 07/12/2016

## 2016-07-14 LAB — URINE CULTURE

## 2016-07-26 ENCOUNTER — Ambulatory Visit
Admission: RE | Admit: 2016-07-26 | Discharge: 2016-07-26 | Disposition: A | Discharge status: Home / Self Care | Payer: 59 | Source: Ambulatory Visit | Attending: Gynecology | Admitting: Gynecology

## 2016-07-26 DIAGNOSIS — Z1231 Encounter for screening mammogram for malignant neoplasm of breast: Secondary | ICD-10-CM | POA: Diagnosis not present

## 2016-08-29 ENCOUNTER — Encounter: Payer: Self-pay | Admitting: Gynecology

## 2016-08-29 ENCOUNTER — Ambulatory Visit (INDEPENDENT_AMBULATORY_CARE_PROVIDER_SITE_OTHER): Payer: 59 | Admitting: Gynecology

## 2016-08-29 VITALS — BP 116/74 | Ht 67.0 in | Wt 126.0 lb

## 2016-08-29 DIAGNOSIS — M810 Age-related osteoporosis without current pathological fracture: Secondary | ICD-10-CM | POA: Diagnosis not present

## 2016-08-29 DIAGNOSIS — N952 Postmenopausal atrophic vaginitis: Secondary | ICD-10-CM

## 2016-08-29 DIAGNOSIS — Z01411 Encounter for gynecological examination (general) (routine) with abnormal findings: Secondary | ICD-10-CM

## 2016-08-29 DIAGNOSIS — Z79890 Hormone replacement therapy (postmenopausal): Secondary | ICD-10-CM | POA: Diagnosis not present

## 2016-08-29 MED ORDER — ESTRADIOL 10 MCG VA TABS: 1.0000 | ORAL_TABLET | VAGINAL | 12 refills | Status: DC

## 2016-08-29 MED ORDER — ESTRADIOL 0.5 MG PO TABS: ORAL_TABLET | ORAL | 12 refills | Status: DC

## 2016-08-29 MED ORDER — MEDROXYPROGESTERONE ACETATE 2.5 MG PO TABS: ORAL_TABLET | ORAL | 12 refills | Status: DC

## 2016-08-29 NOTE — Patient Instructions (Signed)
Try the Vagifem instead of the vaginal estrogen cream and see if it does not make a difference. Let me know if you have any issues with this.  Follow up for bone density this coming fall

## 2016-08-29 NOTE — Progress Notes (Signed)
    Yolanda Carroll 13-Jul-1955 [id number]        61 y.o.  G0P0 for annual exam.    Past medical history,surgical history, problem list, medications, allergies, family history and social history were all reviewed and documented as reviewed in the EPIC chart.  ROS:  Performed with pertinent positives and negatives included in the history, assessment and plan.   Additional significant findings :  None   Exam: [name] assistant Vitals:   08/29/16 1611  BP: 116/74  Weight: 126 lb (57.2 kg)  Height: 5\' 7"  (1.702 m)   Body mass index is 19.73 kg/m.  General appearance:  Normal affect, orientation and appearance. Skin: Grossly normal HEENT: Without gross lesions.  No cervical or supraclavicular adenopathy. Thyroid normal.  Lungs:  Clear without wheezing, rales or rhonchi Cardiac: RR, without RMG Abdominal:  Soft, nontender, without masses, guarding, rebound, organomegaly or hernia Breasts:  Examined lying and sitting without masses, retractions, discharge or axillary adenopathy. Pelvic:  Ext, BUS, Vagina: With atrophic changes  Cervix: With atrophic changes  Uterus: Anteverted, normal size, shape and contour, midline and mobile nontender   Adnexa: Without masses or tenderness    Anus and perineum: Normal   Rectovaginal: Normal sphincter tone without palpated masses or tenderness.    Assessment/Plan:  61 y.o. G0P0 female for annual.   1. Postmenopausal/atrophic genital changes. Continues on estradiol 0.5 mg and Provera 2.5 mg daily. Also uses vaginal estradiol cream twice weekly. Notes the nights she uses the cream she seems to have restless leg type activity. Did not have this with the Vagifem she used before. Would like to go back and try the Vagifem again.  I again reviewed the risks of HRT to include stroke heart attack DVT and breast cancer. Benefits as far as symptom relief and possible cardiovascular and bone health and started early also discussed. At this point patient  wants to continue and I refilled her 1 year. 2. Osteoporosis. DEXA 12/2014 T score -2.6. Stable from prior DEXA. History of Acquanetta Sit, Forteo use. Has been off of this now for 5 years. Recheck bone density this coming fall at a 2 year interval. 3. Mammography 07/2016. Continue with annual mammography when due. SBE monthly reviewed. 4. Pap smear 07/2014. No Pap smear done today. No history of significant abnormal Pap smears. Plan repeat Pap smear next year at 3 year interval per current screening guidelines. 5. Colonoscopy 2016. Repeat at their recommended interval. 6. Health maintenance. No routine lab work done as patient does this elsewhere. Follow up for DEXA in the fall otherwise follow up in one year for annual exam   [name] MD, 4:46 PM 08/29/2016

## 2016-09-10 ENCOUNTER — Other Ambulatory Visit: Payer: Self-pay | Admitting: Gynecology

## 2016-11-14 DIAGNOSIS — R079 Chest pain, unspecified: Secondary | ICD-10-CM | POA: Diagnosis not present

## 2016-11-14 DIAGNOSIS — G43909 Migraine, unspecified, not intractable, without status migrainosus: Secondary | ICD-10-CM | POA: Diagnosis not present

## 2016-11-16 DIAGNOSIS — G43909 Migraine, unspecified, not intractable, without status migrainosus: Secondary | ICD-10-CM | POA: Diagnosis not present

## 2016-11-16 DIAGNOSIS — M419 Scoliosis, unspecified: Secondary | ICD-10-CM | POA: Diagnosis not present

## 2016-11-16 DIAGNOSIS — M545 Low back pain: Secondary | ICD-10-CM | POA: Diagnosis not present

## 2016-11-20 DIAGNOSIS — G43909 Migraine, unspecified, not intractable, without status migrainosus: Secondary | ICD-10-CM | POA: Diagnosis not present

## 2016-12-12 DIAGNOSIS — M546 Pain in thoracic spine: Secondary | ICD-10-CM | POA: Diagnosis not present

## 2016-12-12 DIAGNOSIS — M419 Scoliosis, unspecified: Secondary | ICD-10-CM | POA: Diagnosis not present

## 2016-12-12 DIAGNOSIS — M47816 Spondylosis without myelopathy or radiculopathy, lumbar region: Secondary | ICD-10-CM | POA: Diagnosis not present

## 2016-12-18 ENCOUNTER — Other Ambulatory Visit: Payer: Self-pay | Admitting: Orthopaedic Surgery

## 2016-12-18 DIAGNOSIS — M546 Pain in thoracic spine: Secondary | ICD-10-CM

## 2016-12-28 ENCOUNTER — Ambulatory Visit
Admission: RE | Admit: 2016-12-28 | Discharge: 2016-12-28 | Disposition: A | Discharge status: Home / Self Care | Payer: 59 | Source: Ambulatory Visit | Attending: Orthopaedic Surgery | Admitting: Orthopaedic Surgery

## 2016-12-28 DIAGNOSIS — M546 Pain in thoracic spine: Secondary | ICD-10-CM

## 2016-12-28 DIAGNOSIS — M5125 Other intervertebral disc displacement, thoracolumbar region: Secondary | ICD-10-CM | POA: Diagnosis not present

## 2017-01-02 DIAGNOSIS — M546 Pain in thoracic spine: Secondary | ICD-10-CM | POA: Diagnosis not present

## 2017-01-02 DIAGNOSIS — M542 Cervicalgia: Secondary | ICD-10-CM | POA: Diagnosis not present

## 2017-01-02 DIAGNOSIS — M419 Scoliosis, unspecified: Secondary | ICD-10-CM | POA: Diagnosis not present

## 2017-01-05 ENCOUNTER — Encounter: Payer: Self-pay | Admitting: *Deleted

## 2017-01-08 ENCOUNTER — Encounter: Payer: Self-pay | Admitting: Neurology

## 2017-01-08 ENCOUNTER — Ambulatory Visit (INDEPENDENT_AMBULATORY_CARE_PROVIDER_SITE_OTHER): Payer: 59 | Admitting: Neurology

## 2017-01-08 VITALS — BP 96/61 | HR 76 | Ht 68.0 in | Wt 134.8 lb

## 2017-01-08 DIAGNOSIS — G43001 Migraine without aura, not intractable, with status migrainosus: Secondary | ICD-10-CM

## 2017-01-08 MED ORDER — ELETRIPTAN HYDROBROMIDE 40 MG PO TABS: 40.0000 mg | ORAL_TABLET | ORAL | 12 refills | Status: DC | PRN

## 2017-01-08 MED ORDER — ONDANSETRON 4 MG PO TBDP: 4.0000 mg | ORAL_TABLET | Freq: Three times a day (TID) | ORAL | 12 refills | Status: DC | PRN

## 2017-01-08 MED ORDER — DICLOFENAC POTASSIUM(MIGRAINE) 50 MG PO PACK
50.0000 mg | PACK | Freq: Once | ORAL | Status: DC | PRN
Start: 2017-01-08 — End: 2017-03-13

## 2017-01-08 NOTE — Patient Instructions (Signed)
Remember to drink plenty of fluid, eat healthy meals and do not skip any meals. Try to eat protein with a every meal and eat a healthy snack such as fruit or nuts in between meals. Try to keep a regular sleep-wake schedule and try to exercise daily, particularly in the form of walking, 20-30 minutes a day, if you can.   As far as your medications are concerned, I would like to suggest:  At onset of migraine take Cambia, Relpax and zofran may repeat in 2 hours once  As far as diagnostic testing: MRI brain  I would like to see you back in 6 months, sooner if we need to. Please call us with any interim questions, concerns, problems, updates or refill requests.  Our phone number is [phone]. We also have an after hours call service for urgent matters and there is a physician on-call for urgent questions. For any emergencies you know to call 911 or go to the nearest emergency room  Diclofenac powder for oral solution What is this medicine? DICLOFENAC (dye KLOE fen ak) is a non-steroidal anti-inflammatory drug (NSAID). It is used to treat migraine pain. This medicine may be used for other purposes; ask your health care provider or pharmacist if you have questions. COMMON BRAND NAME(S): Cambia What should I tell my health care provider before I take this medicine? They need to know if you have any of these conditions: -asthma, especially aspirin sensitive asthma -coronary artery bypass graft (CABG) surgery within the past 2 weeks -drink more than 3 alcohol-containing drinks a day -heart disease or circulation problems like heart failure or leg edema (fluid retention) -high blood pressure -kidney disease -liver disease -phenylketonuria -stomach problems -an unusual or allergic reaction to diclofenac, aspirin, other NSAIDs, other medicines, foods, dyes, or preservatives -pregnant or trying to get pregnant -breast-feeding How should I use this medicine? Mix this medicine with 1 to 2 ounces  of water. Drink the medicine and water together. Follow the directions on the prescription label. Do not take your medicine more often than directed. Long-term, continuous use may increase the risk of heart attack or stroke. A special MedGuide will be given to you by the pharmacist with each prescription and refill. Be sure to read this information carefully each time. Talk to your pediatrician regarding the use of this medicine in children. Special care may be needed. Elderly patients over 47 years old may have a stronger reaction and need a smaller dose. Overdosage: If you think you have taken too much of this medicine contact a poison control center or emergency room at once. NOTE: This medicine is only for you. Do not share this medicine with others. What if I miss a dose? This does not apply. What may interact with this medicine? Do not take this medicine with any of the following medications: -cidofovir -ketorolac -methotrexate This medicine may also interact with the following medications: -alcohol -aspirin and aspirin-like medicines -cyclosporine -diuretics -lithium -medicines for blood pressure -medicines for osteoporosis -medicines that affect platelets -medicines that treat or prevent blood clots like warfarin -NSAIDs, medicines for pain and inflammation, like ibuprofen or naproxen -pemetrexed -steroid medicines like prednisone or cortisone This list may not describe all possible interactions. Give your health care provider a list of all the medicines, herbs, non-prescription drugs, or dietary supplements you use. Also tell them if you smoke, drink alcohol, or use illegal drugs. Some items may interact with your medicine. What should I watch for while using this medicine? Tell your  doctor or health care professional if your pain does not get better. Talk to your doctor before taking another medicine for pain. Do not treat yourself. This medicine does not prevent heart attack  or stroke. In fact, this medicine may increase the chance of a heart attack or stroke. The chance may increase with longer use of this medicine and in people who have heart disease. If you take aspirin to prevent heart attack or stroke, talk with your doctor or health care professional. Do not take medicines such as ibuprofen and naproxen with this medicine. Side effects such as stomach upset, nausea, or ulcers may be more likely to occur. Many medicines available without a prescription should not be taken with this medicine. This medicine can cause ulcers and bleeding in the stomach and intestines at any time during treatment. Do not smoke cigarettes or drink alcohol. These increase irritation to your stomach and can make it more susceptible to damage from this medicine. Ulcers and bleeding can happen without warning symptoms and can cause death. You may get drowsy or dizzy. Do not drive, use machinery, or do anything that needs mental alertness until you know how this medicine affects you. Do not stand or sit up quickly, especially if you are an older patient. This reduces the risk of dizzy or fainting spells. This medicine can cause you to bleed more easily. Try to avoid damage to your teeth and gums when you brush or floss your teeth. If you take migraine medicines for 10 or more days a month, your migraines may get worse. Keep a diary of headache days and medicine use. Contact your healthcare professional if your migraine attacks occur more frequently. What side effects may I notice from receiving this medicine? Side effects that you should report to your doctor or health care professional as soon as possible: -allergic reactions like skin rash, itching or hives, swelling of the face, lips, or tongue -black or bloody stools, blood in the urine or vomit -blurred vision -chest pain -difficulty breathing or wheezing -nausea or vomiting -fever -redness, blistering, peeling or loosening of the skin,  including inside the mouth -slurred speech or weakness on one side of the body -trouble passing urine or change in the amount of urine -unexplained weight gain or swelling -unusually weak or tired -yellowing of eyes or skin Side effects that usually do not require medical attention (report to your doctor or health care professional if they continue or are bothersome): -constipation -diarrhea -dizziness -headache -heartburn This list may not describe all possible side effects. Call your doctor for medical advice about side effects. You may report side effects to FDA at 1-800-FDA-1088. Where should I keep my medicine? Keep out of the reach of children. Store at room temperature between 15 and 30 degrees C (59 and 86 degrees F). Throw away any unused medicine after the expiration date. NOTE: This sheet is a summary. It may not cover all possible information. If you have questions about this medicine, talk to your doctor, pharmacist, or health care provider.  2018 [employer] Standard (2015-06-03 09:56:49)   Ondansetron oral dissolving tablet What is this medicine? ONDANSETRON (on DAN se tron) is used to treat nausea and vomiting caused by chemotherapy. It is also used to prevent or treat nausea and vomiting after surgery. This medicine may be used for other purposes; ask your health care provider or pharmacist if you have questions. COMMON BRAND NAME(S): Zofran ODT What should I tell my health care provider before I take  this medicine? They need to know if you have any of these conditions: -heart disease -history of irregular heartbeat -liver disease -low levels of magnesium or potassium in the blood -an unusual or allergic reaction to ondansetron, granisetron, other medicines, foods, dyes, or preservatives -pregnant or trying to get pregnant -breast-feeding How should I use this medicine? These tablets are made to dissolve in the mouth. Do not try to push the tablet through the  foil backing. With dry hands, peel away the foil backing and gently remove the tablet. Place the tablet in the mouth and allow it to dissolve, then swallow. While you may take these tablets with water, it is not necessary to do so. Talk to your pediatrician regarding the use of this medicine in children. Special care may be needed. Overdosage: If you think you have taken too much of this medicine contact a poison control center or emergency room at once. NOTE: This medicine is only for you. Do not share this medicine with others. What if I miss a dose? If you miss a dose, take it as soon as you can. If it is almost time for your next dose, take only that dose. Do not take double or extra doses. What may interact with this medicine? Do not take this medicine with any of the following medications: -apomorphine -certain medicines for fungal infections like fluconazole, itraconazole, ketoconazole, posaconazole, voriconazole -cisapride -dofetilide -dronedarone -pimozide -thioridazine -ziprasidone This medicine may also interact with the following medications: -carbamazepine -certain medicines for depression, anxiety, or psychotic disturbances -fentanyl -linezolid -MAOIs like Carbex, Eldepryl, Marplan, Nardil, and Parnate -methylene blue (injected into a vein) -other medicines that prolong the QT interval (cause an abnormal heart rhythm) -phenytoin -rifampicin -tramadol This list may not describe all possible interactions. Give your health care provider a list of all the medicines, herbs, non-prescription drugs, or dietary supplements you use. Also tell them if you smoke, drink alcohol, or use illegal drugs. Some items may interact with your medicine. What should I watch for while using this medicine? Check with your doctor or health care professional as soon as you can if you have any sign of an allergic reaction. What side effects may I notice from receiving this medicine? Side effects  that you should report to your doctor or health care professional as soon as possible: -allergic reactions like skin rash, itching or hives, swelling of the face, lips, or tongue -breathing problems -confusion -dizziness -fast or irregular heartbeat -feeling faint or lightheaded, falls -fever and chills -loss of balance or coordination -seizures -sweating -swelling of the hands and feet -tightness in the chest -tremors -unusually weak or tired Side effects that usually do not require medical attention (report to your doctor or health care professional if they continue or are bothersome): -constipation or diarrhea -headache This list may not describe all possible side effects. Call your doctor for medical advice about side effects. You may report side effects to FDA at 1-800-FDA-1088. Where should I keep my medicine? Keep out of the reach of children. Store between 2 and 30 degrees C (36 and 86 degrees F). Throw away any unused medicine after the expiration date. NOTE: This sheet is a summary. It may not cover all possible information. If you have questions about this medicine, talk to your doctor, pharmacist, or health care provider.  2018 [employer] Standard (2013-02-05 16:21:52)  Eletriptan tablets What is this medicine? ELETRIPTAN (el ih TRIP tan) is used to treat migraines with or without aura. An aura is a  strange feeling or visual disturbance that warns you of an attack. It is not used to prevent migraines. This medicine may be used for other purposes; ask your health care provider or pharmacist if you have questions. COMMON BRAND NAME(S): Relpax What should I tell my health care provider before I take this medicine? They need to know if you have any of these conditions: -bowel disease or colitis -diabetes -family history of heart disease -fast or irregular heart beat -heart or blood vessel disease, angina (chest pain), or previous heart attack -high blood  pressure -high cholesterol -history of stroke, transient ischemic attacks (TIAs or mini-strokes), or intracranial bleeding -kidney or liver disease -overweight -poor circulation -postmenopausal or surgical removal of uterus and ovaries -Raynaud's disease -seizure disorder -an unusual or allergic reaction to eletriptan, other medicines, foods, dyes, or preservatives -pregnant or trying to get pregnant -breast-feeding How should I use this medicine? Take this medicine by mouth with a glass of water. Follow the directions on the prescription label. This medicine is taken at the first symptoms of a migraine. It is not for everyday use. If your migraine headache returns after one dose, you can take another dose as directed. You must leave at least 2 hours between doses, and do not take more than 40 mg as a single dose. Do not take more than 80 mg total in any 24 hour period. If there is no improvement at all after the first dose, do not take a second dose without talking to your doctor or health care professional. Do not take your medicine more often than directed. Talk to your pediatrician regarding the use of this medicine in children. Special care may be needed. Overdosage: If you think you have taken too much of this medicine contact a poison control center or emergency room at once. NOTE: This medicine is only for you. Do not share this medicine with others. What if I miss a dose? This does not apply; this medicine is not for regular use. What may interact with this medicine? Do not take this medicine with any of the following medications: -antiviral medicines for HIV or AIDS -certain antibiotics like clarithromycin, erythromycin, telithromycin -cimetidine -conivaptan -dalfopristin; quinupristin -diltiazem -ergot alkaloids like dihydroergotamine, ergonovine, ergotamine, methylergonovine -fluvoxamine -idelalisib -imatinib -medicines for fungal infections like fluconazole, itraconazole,  ketoconazole, and voriconazole -mifepristone -nefazodone -stimulant medicines for attention disorders, weight loss, or to stay awake -verapamil -zafirlukast This medicine may also interact with the following medications: -certain medicines for depression, anxiety, or psychotic disturbances This list may not describe all possible interactions. Give your health care provider a list of all the medicines, herbs, non-prescription drugs, or dietary supplements you use. Also tell them if you smoke, drink alcohol, or use illegal drugs. Some items may interact with your medicine. What should I watch for while using this medicine? Only take this medicine for a migraine headache. Take it if you get warning symptoms or at the start of a migraine attack. It is not for regular use to prevent migraine attacks. You may get drowsy or dizzy. Do not drive, use machinery, or do anything that needs mental alertness until you know how this medicine affects you. To reduce dizzy or fainting spells, do not sit or stand up quickly, especially if you are an older patient. Alcohol can increase drowsiness, dizziness and flushing. Avoid alcoholic drinks. Smoking cigarettes may increase the risk of heart-related side effects from using this medicine. If you take migraine medicines for 10 or more days a month,  your migraines may get worse. Keep a diary of headache days and medicine use. Contact your healthcare professional if your migraine attacks occur more frequently. What side effects may I notice from receiving this medicine? Side effects that you should report to your doctor or health care professional as soon as possible: -allergic reactions like skin rash, itching or hives, swelling of the face, lips, or tongue -fast, slow, or irregular heart beat -increased or decreased blood pressure -seizures -severe stomach pain and cramping, bloody diarrhea -signs and symptoms of a blood clot such as breathing problems; changes in  vision; chest pain; severe, sudden headache; pain, swelling, warmth in the leg; trouble speaking; sudden numbness or weakness of the face, arm or leg -tingling, pain, or numbness in the face, hands, or feet Side effects that usually do not require medical attention (report to your doctor or health care professional if they continue or are bothersome): -drowsiness -feeling warm, flushing, or redness of the face -headache -muscle cramps, pain -nausea, vomiting -unusually weak or tired This list may not describe all possible side effects. Call your doctor for medical advice about side effects. You may report side effects to FDA at 1-800-FDA-1088. Where should I keep my medicine? Keep out of the reach of children. Store at room temperature between 15 and 30 degrees C (59 and 86 degrees F). Throw away any unused medicine after the expiration date. NOTE: This sheet is a summary. It may not cover all possible information. If you have questions about this medicine, talk to your doctor, pharmacist, or health care provider.  2018 [employer] Standard (2015-04-29 11:20:42)

## 2017-01-08 NOTE — Progress Notes (Signed)
[facility]    Provider:  Dr Jaynee Eagles Referring Provider: Jonathon Jordan, MD Primary Care Physician:  Jonathon Jordan, MD  CC:  Migraine  HPI:  Yolanda Carroll is a 61 y.o. female here as a referral from Dr. Stephanie Acre for migraines. She is currently on Depakote, meloxicam, Imitrex, baclofen. She has a past medical history of osteoporosis, plantar fasciitis, migraine, irritable bowel syndrome, fatigue, neck and back pain with degenerative disc disease and radiculopathy, arthritis. She has had migraines since 2000. Started worsening in October. They can last up to 2 weeks straight. She has associated vertigo. She has done well on nefazodone and depakote. The next headache was in June and lasted 2 weeks. It hurts behind the eyes, she can still function but is moderate in pain, weather triggers, she has light sensitivity, no significant nausea or vomiting. She has had nausea and vomiting in the past. Depakote is working for her. Slowly progressive when they start. No medication overuse. No other focal neurologic deficits, associated symptoms, inciting events or modifiable factors.  Meds tried: Depakote, Imitrex, baclofen, Nortriptyline, Gabapentin, Nefazodone, Skelaxin, ketaprofen,   Reviewed notes, labs and imaging from outside physicians, which showe:   Personally reviewed imaging and agree with following  MRI cervical spine 07/2013: 1. Mild worsening of the foraminal impingement at C7-T1 due to progressive spondylosis and degenerative disc disease. 2. Mild left foraminal impingement at C3-4, C5-6, and C6-7 primarily due to spurring. Although there is some residual left foraminal impingement at the postoperative levels, the degree of impingement at these levels is much less than on the preoperative exam.  Review of Systems: Patient complains of symptoms per HPI as well as the following symptoms: ringing in ears, anxiety. Pertinent negatives and positives per HPI. All others  negative.   Social History   Social History  . Marital status: Married    Spouse name: Fritz Pickerel  . Number of children: 0  . Years of education: 25   Occupational History  .      [employer]   Social History Main Topics  . Smoking status: Former Smoker    Quit date: 05/17/1983  . Smokeless tobacco: Never Used  . Alcohol use 0.6 oz/week    1 Standard drinks or equivalent per week     Comment: socially, occasional  . Drug use: No  . Sexual activity: Yes    Birth control/ protection: Post-menopausal     Comment: intercourse age 73 , sexual partners more than 5   Other Topics Concern  . Not on file   Social History Narrative   Pt is married, no children.  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    Caffeine- none    Family History  Problem Relation Age of Onset  . Cancer Father        lymphoma  . Other Mother        bipolar,reflux  . Bipolar disorder Mother   . Other Brother        sinus problems  . Cancer Maternal Aunt        uterine cancer  . Breast cancer Maternal Aunt        40's  . Diabetes Maternal Aunt   . Cancer Paternal Aunt        Colon cancer  . Breast cancer Cousin 27       Mat. 1st cousin    Past Medical History:  Diagnosis Date  . Allergy   . Arthritis   . Cervical  dysplasia   . Endometriosis   . Fatigue   . GERD (gastroesophageal reflux disease)   . IBS (irritable bowel syndrome)   . Migraine   . Osteoporosis 12/2014   T score -2.5 stable from prior DEXA  . Plantar fasciitis   . PONV (postoperative nausea and vomiting)    also difficult to wake up  . Recurrent vaginitis   . Reflux   . Scoliosis   . Wears glasses     Past Surgical History:  Procedure Laterality Date  . ANTERIOR CERVICAL DECOMP/DISCECTOMY FUSION  04/17/2011   Procedure: ANTERIOR CERVICAL DECOMPRESSION/DISCECTOMY FUSION 2 LEVELS;  Surgeon: Hosie Spangle;  Location: [place] [facility];  Service: Neurosurgery;  Laterality: N/A;  Cervical  five-six, six-seven anterior cervical decompression with fusion,  plating,  and bonegraft   . APPENDECTOMY  1978  . BREAST BIOPSY  07/13/2011   Procedure: BREAST BIOPSY WITH NEEDLE LOCALIZATION;  Surgeon: Rolm Bookbinder, MD;  Location: [place];  Service: General;  Laterality: Right;  Right breast wire localization biopsy  . BREAST EXCISIONAL BIOPSY Right    2013  . BREAST SURGERY     Breast Bx-Benign  . CYSTOSCOPY    . GYNECOLOGIC CRYOSURGERY    . HERNIA REPAIR  08/02/1995   RIH  . LAPAROSCOPIC ENDOMETRIOSIS FULGURATION  1997  . PELVIC LAPAROSCOPY    . ROTATOR CUFF REPAIR     right 2002 left 2000    Current Outpatient Prescriptions  Medication Sig Dispense Refill  . ALPRAZolam (XANAX) 0.25 MG tablet Take 0.25 mg by mouth daily as needed.     . Ascorbic Acid (VITAMIN C PO) Take 1 tablet by mouth daily. 500mg  capsule    . baclofen (LIORESAL) 10 MG tablet 10 mg as needed.    Marland Kitchen BEPREVE 1.5 % SOLN     . Calcium Carbonate (CALCIUM 600 PO) Take 1 tablet by mouth 2 (two) times daily.     . Cholecalciferol (VITAMIN D PO) Take 800 Units by mouth 2 (two) times daily.     . divalproex (DEPAKOTE) 500 MG 24 hr tablet Take 500 mg by mouth at bedtime.     Marland Kitchen estradiol (ESTRACE) 0.5 MG tablet TAKE 1 TABLET (0.5 MG TOTAL) BY MOUTH DAILY. 30 tablet 12  . Gabapentin (NEURONTIN PO) Take 100 mg by mouth daily.     Marland Kitchen lidocaine (LIDODERM) 5 % Place onto the skin as needed. Back pain    . meclizine (ANTIVERT) 25 MG tablet Take 25 mg by mouth 3 (three) times daily as needed for dizziness.    . medroxyPROGESTERone (PROVERA) 2.5 MG tablet TAKE ONE TABLET BY MOUTH EVERY NIGHT AT BEDTIME 30 tablet 11  . meloxicam (MOBIC) 7.5 MG tablet Take 7.5 mg by mouth daily.     . Multiple Vitamin (MULTIVITAMIN) tablet Take 1 tablet by mouth daily.    . nefazodone (SERZONE) 150 MG tablet Take 150 mg by mouth daily.    . NONFORMULARY OR COMPOUNDED ITEM Estradiol vaginal cream 0.02% insert vaginally 3 times  weekly 90 each 3  . pantoprazole (PROTONIX) 20 MG tablet Take 20 mg by mouth 2 (two) times daily.    . SUMAtriptan (IMITREX) 50 MG tablet Take 50 mg by mouth every 2 (two) hours as needed for migraine. May repeat in 2 hours if headache persists or recurs.    . valACYclovir (VALTREX) 500 MG tablet Take 500 mg by mouth 2 (two) times daily as needed.    . vitamin E 400 UNIT  capsule Take 200 Units by mouth daily.     . Diclofenac Potassium 50 MG PACK Take 50 mg by mouth once as needed. Take once daily as needed with headache onset. Please take with food 30 each   . eletriptan (RELPAX) 40 MG tablet Take 1 tablet (40 mg total) by mouth as needed for migraine or headache. May repeat in 2 hours if headache persists or recurs. 10 tablet 12  . ondansetron (ZOFRAN ODT) 4 MG disintegrating tablet Take 1 tablet (4 mg total) by mouth every 8 (eight) hours as needed for nausea or vomiting. 20 tablet 12   No current facility-administered medications for this visit.     Allergies as of 01/08/2017 - Review Complete 01/08/2017  Allergen Reaction Noted  . Penicillins Other (See Comments) 11/30/2010  . Atarax [hydroxyzine] Other (See Comments) 01/08/2017  . Celexa [citalopram hydrobromide] Other (See Comments) 02/21/2012  . Erythromycin  01/05/2017  . Nortriptyline Itching 04/17/2011  . Prilosec [omeprazole]  01/05/2017  . Requip [ropinirole]  01/05/2017  . Tramadol Itching 04/05/2011  . Darvocet [propoxyphene n-acetaminophen] Itching 11/30/2010  . Percocet [oxycodone-acetaminophen] Itching 11/30/2010    Vitals: BP 96/61   Pulse 76   Ht 5\' 8"  (1.727 m)   Wt 134 lb 12.8 oz (61.1 kg)   BMI 20.50 kg/m  Last Weight:  Wt Readings from Last 1 Encounters:  01/08/17 134 lb 12.8 oz (61.1 kg)   Last Height:   Ht Readings from Last 1 Encounters:  01/08/17 5\' 8"  (1.727 m)    Physical exam: Exam: Gen: NAD, conversant, well nourised, well groomed                     CV: RRR, no MRG. No Carotid Bruits. No  peripheral edema, warm, nontender Eyes: Conjunctivae clear without exudates or hemorrhage  Neuro: Detailed Neurologic Exam  Speech:    Speech is normal; fluent and spontaneous with normal comprehension.  Cognition:    The patient is oriented to person, place, and time;     recent and remote memory intact;     language fluent;     normal attention, concentration,     fund of knowledge Cranial Nerves:    The pupils are equal, round, and reactive to light. The fundi are normal and spontaneous venous pulsations are present. Visual fields are full to finger confrontation. Extraocular movements are intact. Trigeminal sensation is intact and the muscles of mastication are normal. The face is symmetric. The palate elevates in the midline. Hearing intact. Voice is normal. Shoulder shrug is normal. The tongue has normal motion without fasciculations.   Coordination:    Normal finger to nose and heel to shin. Normal rapid alternating movements.   Gait:    Heel-toe and tandem gait are normal.   Motor Observation:    No asymmetry, no atrophy, and no involuntary movements noted. Tone:    Normal muscle tone.    Posture:    Posture is normal. normal erect    Strength:    Strength is V/V in the upper and lower limbs.      Sensation: intact to LT     Reflex Exam:  DTR's:    Deep tendon reflexes in the upper and lower extremities are normal bilaterally.   Toes:    The toes are downgoing bilaterally.   Clonus:    Clonus is absent.      Assessment/Plan:  Patient with episodic migraines  Recommend MRI brain, she would like to wait  Continue Depakote As far as medications are concerned, I would like to suggest: Patient has occ. Migraine but has not been able to treat them acutely, will try multiple medications for acute management at onset of migraine take Cambia, Relpax and zofran may repeat in 2 hours once   To prevent or relieve headaches, try the following: Cool Compress. Lie  down and place a cool compress on your head.  Avoid headache triggers. If certain foods or odors seem to have triggered your migraines in the past, avoid them. A headache diary might help you identify triggers.  Include physical activity in your daily routine. Try a daily walk or other moderate aerobic exercise.  Manage stress. Find healthy ways to cope with the stressors, such as delegating tasks on your to-do list.  Practice relaxation techniques. Try deep breathing, yoga, massage and visualization.  Eat regularly. Eating regularly scheduled meals and maintaining a healthy diet might help prevent headaches. Also, drink plenty of fluids.  Follow a regular sleep schedule. Sleep deprivation might contribute to headaches Consider biofeedback. With this mind-body technique, you learn to control certain bodily functions - such as muscle tension, heart rate and blood pressure - to prevent headaches or reduce headache pain.    Proceed to emergency room if you experience new or worsening symptoms or symptoms do not resolve, if you have new neurologic symptoms or if headache is severe, or for any concerning symptom.   Provided education and documentation from American headache Society toolbox including articles on: chronic migraine medication overuse headache, chronic migraines, prevention of migraines, behavioral and other nonpharmacologic treatments for headache.  Orders Placed This Encounter  Procedures  . CBC  . Comprehensive metabolic panel     Sarina Ill, MD  [facility] [address] [place], [place] [address]  Phone [phone] Fax [phone]

## 2017-01-09 ENCOUNTER — Encounter: Payer: Self-pay | Admitting: Neurology

## 2017-01-16 ENCOUNTER — Encounter: Payer: Self-pay | Admitting: Neurology

## 2017-02-06 ENCOUNTER — Encounter: Payer: Self-pay | Admitting: Gynecology

## 2017-02-06 ENCOUNTER — Telehealth: Payer: Self-pay | Admitting: Gynecology

## 2017-02-06 ENCOUNTER — Ambulatory Visit (INDEPENDENT_AMBULATORY_CARE_PROVIDER_SITE_OTHER): Payer: 59

## 2017-02-06 ENCOUNTER — Other Ambulatory Visit: Payer: Self-pay | Admitting: Gynecology

## 2017-02-06 DIAGNOSIS — M810 Age-related osteoporosis without current pathological fracture: Secondary | ICD-10-CM | POA: Diagnosis not present

## 2017-02-06 NOTE — Telephone Encounter (Signed)
Tell patient her bone study continues to show osteoporosis. It has a mixed pattern with some improvement at one point and some decline at another. Recommend office visit to discuss her report and treatment options.

## 2017-02-07 ENCOUNTER — Encounter: Payer: Self-pay | Admitting: *Deleted

## 2017-02-07 DIAGNOSIS — Z23 Encounter for immunization: Secondary | ICD-10-CM | POA: Diagnosis not present

## 2017-02-07 NOTE — Telephone Encounter (Signed)
Sent pt mychart message

## 2017-02-13 ENCOUNTER — Encounter: Payer: Self-pay | Admitting: Neurology

## 2017-02-14 NOTE — Telephone Encounter (Signed)
Pt reviewed my chart message. °

## 2017-03-12 ENCOUNTER — Ambulatory Visit (INDEPENDENT_AMBULATORY_CARE_PROVIDER_SITE_OTHER): Payer: 59 | Admitting: Neurology

## 2017-03-12 ENCOUNTER — Encounter: Payer: Self-pay | Admitting: Neurology

## 2017-03-12 VITALS — BP 104/54 | HR 64 | Wt 136.2 lb

## 2017-03-12 DIAGNOSIS — G43009 Migraine without aura, not intractable, without status migrainosus: Secondary | ICD-10-CM

## 2017-03-12 MED ORDER — KETOROLAC TROMETHAMINE 60 MG/2ML IM SOLN
60.0000 mg | Freq: Once | INTRAMUSCULAR | Status: AC
  Administered 2017-03-12: 60 mg via INTRAMUSCULAR

## 2017-03-12 MED ORDER — BUTALBITAL-APAP-CAFFEINE 50-325-40 MG PO TABS: 1.0000 | ORAL_TABLET | Freq: Four times a day (QID) | ORAL | 3 refills | Status: DC | PRN

## 2017-03-12 NOTE — Progress Notes (Signed)
Toradol 60 mg IM injection given in RUOQ of buttock. Bandaid applied. Tolerated well. See MAR for lot/exp/ndc.

## 2017-03-12 NOTE — Progress Notes (Signed)
[facility]    Provider:  Dr Jaynee Eagles Referring Provider: Jonathon Jordan, MD Primary Care Physician:  Jonathon Jordan, MD  CC:  Migraine  Interval history 03/12/2017: Tried a combination of medications, cambia, relpax and zofran. Cambia alone did not help. Zofran and relpax did not help when she got home. Took all three together later in the mirgaine didn;t help. But also tried it at onset of headache and did not stop the migraine. She had her last migraine at the end of August until the 17th of September. Then October 24th had another headache and the tylenol worked. Lasted a few days, baclofen for neck pain but did not stop it until the 26th-28th. 63 days has had 41 headache free days. In 2 months had 22 migraine days. Season may also trigger. She has tried imitrex PO. Tramadol shot in the past had not helped, migraine started on 6/27 and imitrex did not help but she had the headache for several days so this was not at onset. Tried cambia.   HPI:  Yolanda Carroll is a 61 y.o. female here as a referral from Dr. Stephanie Acre for migraines. She is currently on Depakote, meloxicam, Imitrex, baclofen. She has a past medical history of osteoporosis, plantar fasciitis, migraine, irritable bowel syndrome, fatigue, neck and back pain with degenerative disc disease and radiculopathy, arthritis. She has had migraines since 2000. Started worsening in October. They can last up to 2 weeks straight. She has associated vertigo. She has done well on nefazodone and depakote. The next headache was in June and lasted 2 weeks. It hurts behind the eyes, she can still function but is moderate in pain, weather triggers, she has light sensitivity, no significant nausea or vomiting. She has had nausea and vomiting in the past. Depakote is working for her. Slowly progressive when they start. No medication overuse. No other focal neurologic deficits, associated symptoms, inciting events or modifiable  factors.  Meds tried: Depakote, Imitrex, baclofen, Nortriptyline, Gabapentin, Nefazodone, Skelaxin, ketaprofen,   Reviewed notes, labs and imaging from outside physicians, which showe:   Personally reviewed imaging and agree with following  MRI cervical spine 07/2013: 1. Mild worsening of the foraminal impingement at C7-T1 due to progressive spondylosis and degenerative disc disease. 2. Mild left foraminal impingement at C3-4, C5-6, and C6-7 primarily due to spurring. Although there is some residual left foraminal impingement at the postoperative levels, the degree of impingement at these levels is much less than on the preoperative exam.  Review of Systems: Patient complains of symptoms per HPI as well as the following symptoms: ringing in ears, anxiety. Pertinent negatives and positives per HPI. All others negative.  Review of Systems: Patient complains of symptoms per HPI as well as the following symptoms:headache. Pertinent negatives and positives per HPI. All others negative.   Social History   Social History  . Marital status: Married    Spouse name: Fritz Pickerel  . Number of children: 0  . Years of education: 28   Occupational History  .      [employer]   Social History Main Topics  . Smoking status: Former Smoker    Quit date: 05/17/1983  . Smokeless tobacco: Never Used  . Alcohol use 0.6 oz/week    1 Standard drinks or equivalent per week     Comment: socially, occasional  . Drug use: No  . Sexual activity: Yes    Birth control/ protection: Post-menopausal     Comment: intercourse age 27 , sexual  partners more than 5   Other Topics Concern  . Not on file   Social History Narrative   Pt is married, no children.  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    Caffeine- very little.    Family History  Problem Relation Age of Onset  . Cancer Father        lymphoma  . Other Mother        bipolar,reflux  . Bipolar disorder  Mother   . Other Brother        sinus problems  . Cancer Maternal Aunt        uterine cancer  . Breast cancer Maternal Aunt        40's  . Diabetes Maternal Aunt   . Cancer Paternal Aunt        Colon cancer  . Breast cancer Cousin 80       Mat. 1st cousin    Past Medical History:  Diagnosis Date  . Allergy   . Arthritis   . Cervical dysplasia   . Endometriosis   . Fatigue   . GERD (gastroesophageal reflux disease)   . IBS (irritable bowel syndrome)   . Migraine   . Osteoporosis 01/2017   T score -2.6  . Plantar fasciitis   . PONV (postoperative nausea and vomiting)    also difficult to wake up  . Recurrent vaginitis   . Reflux   . Scoliosis   . Wears glasses     Past Surgical History:  Procedure Laterality Date  . ANTERIOR CERVICAL DECOMP/DISCECTOMY FUSION  04/17/2011   Procedure: ANTERIOR CERVICAL DECOMPRESSION/DISCECTOMY FUSION 2 LEVELS;  Surgeon: Hosie Spangle;  Location: [place] [facility];  Service: Neurosurgery;  Laterality: N/A;  Cervical five-six, six-seven anterior cervical decompression with fusion,  plating,  and bonegraft   . APPENDECTOMY  1978  . BREAST BIOPSY  07/13/2011   Procedure: BREAST BIOPSY WITH NEEDLE LOCALIZATION;  Surgeon: Rolm Bookbinder, MD;  Location: [facility];  Service: General;  Laterality: Right;  Right breast wire localization biopsy  . BREAST EXCISIONAL BIOPSY Right    2013  . BREAST SURGERY     Breast Bx-Benign  . CYSTOSCOPY    . GYNECOLOGIC CRYOSURGERY    . HERNIA REPAIR  08/02/1995   RIH  . LAPAROSCOPIC ENDOMETRIOSIS FULGURATION  1997  . PELVIC LAPAROSCOPY    . ROTATOR CUFF REPAIR     right 2002 left 2000    Current Outpatient Prescriptions  Medication Sig Dispense Refill  . ALPRAZolam (XANAX) 0.25 MG tablet Take 0.25 mg by mouth daily as needed.     . Ascorbic Acid (VITAMIN C PO) Take 1 tablet by mouth daily. 500mg  capsule    . baclofen (LIORESAL) 10 MG tablet 10 mg as needed.    Marland Kitchen BEPREVE 1.5 % SOLN      . Calcium Carbonate (CALCIUM 600 PO) Take 1 tablet by mouth 2 (two) times daily.     . Cholecalciferol (VITAMIN D PO) Take 800 Units by mouth 2 (two) times daily.     . Diclofenac Potassium 50 MG PACK Take 50 mg by mouth once as needed. Take once daily as needed with headache onset. Please take with food 30 each   . divalproex (DEPAKOTE) 500 MG 24 hr tablet Take 500 mg by mouth at bedtime.     Marland Kitchen eletriptan (RELPAX) 40 MG tablet Take 1 tablet (40 mg total) by mouth as needed for migraine or headache. May repeat in 2 hours  if headache persists or recurs. 10 tablet 12  . estradiol (ESTRACE) 0.5 MG tablet TAKE 1 TABLET (0.5 MG TOTAL) BY MOUTH DAILY. 30 tablet 12  . Gabapentin (NEURONTIN PO) Take 100 mg by mouth daily.     Marland Kitchen lidocaine (LIDODERM) 5 % Place onto the skin as needed. Back pain    . meclizine (ANTIVERT) 25 MG tablet Take 25 mg by mouth 3 (three) times daily as needed for dizziness.    . medroxyPROGESTERone (PROVERA) 2.5 MG tablet TAKE ONE TABLET BY MOUTH EVERY NIGHT AT BEDTIME 30 tablet 11  . meloxicam (MOBIC) 7.5 MG tablet Take 7.5 mg by mouth daily.     . Multiple Vitamin (MULTIVITAMIN) tablet Take 1 tablet by mouth daily.    . nefazodone (SERZONE) 150 MG tablet Take 150 mg by mouth daily.    . NONFORMULARY OR COMPOUNDED ITEM Estradiol vaginal cream 0.02% insert vaginally 3 times weekly 90 each 3  . ondansetron (ZOFRAN ODT) 4 MG disintegrating tablet Take 1 tablet (4 mg total) by mouth every 8 (eight) hours as needed for nausea or vomiting. 20 tablet 12  . pantoprazole (PROTONIX) 20 MG tablet Take 20 mg by mouth 2 (two) times daily.    . SUMAtriptan (IMITREX) 50 MG tablet Take 50 mg by mouth every 2 (two) hours as needed for migraine. May repeat in 2 hours if headache persists or recurs.    . valACYclovir (VALTREX) 500 MG tablet Take 500 mg by mouth 2 (two) times daily as needed.    . vitamin E 400 UNIT capsule Take 200 Units by mouth daily.      No current facility-administered  medications for this visit.     Allergies as of 03/12/2017 - Review Complete 03/12/2017  Allergen Reaction Noted  . Penicillins Other (See Comments) 11/30/2010  . Atarax [hydroxyzine] Other (See Comments) 01/08/2017  . Celexa [citalopram hydrobromide] Other (See Comments) 02/21/2012  . Erythromycin  01/05/2017  . Nortriptyline Itching 04/17/2011  . Prilosec [omeprazole]  01/05/2017  . Requip [ropinirole]  01/05/2017  . Tramadol Itching 04/05/2011  . Darvocet [propoxyphene n-acetaminophen] Itching 11/30/2010  . Percocet [oxycodone-acetaminophen] Itching 11/30/2010    Vitals: BP (!) 104/54   Pulse 64   Wt 136 lb 3.2 oz (61.8 kg)   BMI 20.71 kg/m  Last Weight:  Wt Readings from Last 1 Encounters:  03/12/17 136 lb 3.2 oz (61.8 kg)   Last Height:   Ht Readings from Last 1 Encounters:  01/08/17 5\' 8"  (1.727 m)   Physical exam: Exam: Gen: NAD, conversant, well nourised, obese, well groomed                     CV: RRR, no MRG. No Carotid Bruits. No peripheral edema, warm, nontender Eyes: Conjunctivae clear without exudates or hemorrhage  Neuro: Detailed Neurologic Exam  Speech:    Speech is normal; fluent and spontaneous with normal comprehension.  Cognition:    The patient is oriented to person, place, and time;     recent and remote memory intact;     language fluent;     normal attention, concentration,     fund of knowledge Cranial Nerves:    The pupils are equal, round, and reactive to light. The fundi are normal and spontaneous venous pulsations are present. Visual fields are full to finger confrontation. Extraocular movements are intact. Trigeminal sensation is intact and the muscles of mastication are normal. The face is symmetric. The palate elevates in the midline. Hearing intact.  Voice is normal. Shoulder shrug is normal. The tongue has normal motion without fasciculations.   Coordination:    Normal finger to nose and heel to shin. Normal rapid alternating  movements.   Gait:    Heel-toe and tandem gait are normal.   Motor Observation:    No asymmetry, no atrophy, and no involuntary movements noted. Tone:    Normal muscle tone.    Posture:    Posture is normal. normal erect    Strength:    Strength is V/V in the upper and lower limbs.      Sensation: intact to LT     Reflex Exam:  DTR's:    Deep tendon reflexes in the upper and lower extremities are normal bilaterally.   Toes:    The toes are downgoing bilaterally.   Clonus:    Clonus is absent.    Assessment/Plan:  Patient with episodic migraines, has failed multiple acute medications  Acute medications Tried: meclizine, relpax, imitrex, ibuprofen, tylenol, cambia, zofran, has tried these in combination.  Will try Fioricet sparingly, Warned that fioricet may be smilkar to opiod and cause itching,  Depakote may affect bone density, discuss with pcp  Recommend MRI brain, she would like to wait Continue Depakote   Sarina Ill, MD  [facility] [address] [place], [place] [address]  Phone [phone] Fax [phone]  A total of 15 minutes was spent face-to-face with this patient. Over half this time was spent on counseling patient on the migrainediagnosis and different diagnostic and therapeutic options available.

## 2017-03-12 NOTE — Patient Instructions (Signed)
Acetaminophen; Butalbital; Caffeine tablets or capsules What is this medicine? ACETAMINOPHEN; BUTALBITAL; CAFFEINE (a set a MEE noe fen; byoo TAL bi tal; KAF een) is a pain reliever. It is used to treat tension headaches. This medicine may be used for other purposes; ask your health care provider or pharmacist if you have questions. COMMON BRAND NAME(S): Alagesic, Americet, Anolor-300, Arcet, BAC, CAPACET, Dolgic Plus, Esgic, Esgic Plus, Ezol, Fioricet, [employer], Medigesic, Glidden, Pacaps, Phrenilin Forte, Repan, Marlin, Triad, Zebutal What should I tell my health care provider before I take this medicine? They need to know if you have any of these conditions: -drug abuse or addiction -heart or circulation problems -if you often drink alcohol -kidney disease or problems going to the bathroom -liver disease -lung disease, asthma, or breathing problems -porphyria -an unusual or allergic reaction to acetaminophen, butalbital or other barbiturates, caffeine, other medicines, foods, dyes, or preservatives -pregnant or trying to get pregnant -breast-feeding How should I use this medicine? Take this medicine by mouth with a full glass of water. Follow the directions on the prescription label. If the medicine upsets your stomach, take the medicine with food or milk. Do not take more than you are told to take. Talk to your pediatrician regarding the use of this medicine in children. Special care may be needed. Overdosage: If you think you have taken too much of this medicine contact a poison control center or emergency room at once. NOTE: This medicine is only for you. Do not share this medicine with others. What if I miss a dose? If you miss a dose, take it as soon as you can. If it is almost time for your next dose, take only that dose. Do not take double or extra doses. What may interact with this medicine? -alcohol or medicines that contain alcohol -antidepressants, especially MAOIs like  isocarboxazid, phenelzine, tranylcypromine, and selegiline -antihistamines -benzodiazepines -carbamazepine -isoniazid -medicines for pain like pentazocine, buprenorphine, butorphanol, nalbuphine, tramadol, and propoxyphene -muscle relaxants -naltrexone -phenobarbital, phenytoin, and fosphenytoin -phenothiazines like perphenazine, thioridazine, chlorpromazine, mesoridazine, fluphenazine, prochlorperazine, promazine, and trifluoperazine -voriconazole This list may not describe all possible interactions. Give your health care provider a list of all the medicines, herbs, non-prescription drugs, or dietary supplements you use. Also tell them if you smoke, drink alcohol, or use illegal drugs. Some items may interact with your medicine. What should I watch for while using this medicine? Tell your doctor or health care professional if your pain does not go away, if it gets worse, or if you have new or a different type of pain. You may develop tolerance to the medicine. Tolerance means that you will need a higher dose of the medicine for pain relief. Tolerance is normal and is expected if you take the medicine for a long time. Do not suddenly stop taking your medicine because you may develop a severe reaction. Your body becomes used to the medicine. This does NOT mean you are addicted. Addiction is a behavior related to getting and using a drug for a non-medical reason. If you have pain, you have a medical reason to take pain medicine. Your doctor will tell you how much medicine to take. If your doctor wants you to stop the medicine, the dose will be slowly lowered over time to avoid any side effects. You may get drowsy or dizzy when you first start taking the medicine or change doses. Do not drive, use machinery, or do anything that may be dangerous until you know how the medicine affects you.  Stand or sit up slowly. Do not take other medicines that contain acetaminophen with this medicine. Always read  labels carefully. If you have questions, ask your doctor or pharmacist. If you take too much acetaminophen get medical help right away. Too much acetaminophen can be very dangerous and cause liver damage. Even if you do not have symptoms, it is important to get help right away. What side effects may I notice from receiving this medicine? Side effects that you should report to your doctor or health care professional as soon as possible: -allergic reactions like skin rash, itching or hives, swelling of the face, lips, or tongue -breathing problems -confusion -feeling faint or lightheaded, falls -redness, blistering, peeling or loosening of the skin, including inside the mouth -seizure -stomach pain -yellowing of the eyes or skin Side effects that usually do not require medical attention (report to your doctor or health care professional if they continue or are bothersome): -constipation -nausea, vomiting This list may not describe all possible side effects. Call your doctor for medical advice about side effects. You may report side effects to FDA at 1-800-FDA-1088. Where should I keep my medicine? Keep out of the reach of children. This medicine can be abused. Keep your medicine in a safe place to protect it from theft. Do not share this medicine with anyone. Selling or giving away this medicine is dangerous and against the law. This medicine may cause accidental overdose and death if it taken by other adults, children, or pets. Mix any unused medicine with a substance like cat litter or coffee grounds. Then throw the medicine away in a sealed container like a sealed bag or a coffee can with a lid. Do not use the medicine after the expiration date. Store at room temperature between 15 and 30 degrees C (59 and 86 degrees F). NOTE: This sheet is a summary. It may not cover all possible information. If you have questions about this medicine, talk to your doctor, pharmacist, or health care provider.   2018 Elsevier/Gold Standard (2013-06-27 15:00:25)

## 2017-03-13 ENCOUNTER — Telehealth: Payer: Self-pay | Admitting: *Deleted

## 2017-03-13 ENCOUNTER — Encounter: Payer: Self-pay | Admitting: Gynecology

## 2017-03-13 ENCOUNTER — Ambulatory Visit (INDEPENDENT_AMBULATORY_CARE_PROVIDER_SITE_OTHER): Payer: 59 | Admitting: Gynecology

## 2017-03-13 VITALS — BP 118/76

## 2017-03-13 DIAGNOSIS — M810 Age-related osteoporosis without current pathological fracture: Secondary | ICD-10-CM

## 2017-03-13 DIAGNOSIS — N941 Unspecified dyspareunia: Secondary | ICD-10-CM

## 2017-03-13 NOTE — Progress Notes (Signed)
    Yolanda Carroll 1956/02/21 [id number]        61 y.o.  G0P0 who presents with 2 issues:  1. With her last coital episode she had pain on the outside of her left vulva.  Felt stinging in this area afterwards.  Uses estradiol vaginal cream twice weekly and lubrication with intercourse.  Feels better now. 2. Osteoporosis discussion with review of her most recent DEXA.  History of osteoporosis in the past.  Had used [place] with ultimate Forteo times 2 years having finished this 6 or 7 years ago historically.  Currently on HRT to include estradiol 0.5 mg and Provera 2.5 mg daily.  Bone density has been stable until most recently.  DEXA 01/2017 T score -2.6 right femoral neck.  This is stable from her prior 2016 study.  Her left femoral neck showed a 7.5% decline from her prior study.  Her spine due to degenerative changes was not measured.  She does have a family history of osteoporosis in her mother.  Past medical history,surgical history, problem list, medications, allergies, family history and social history were all reviewed and documented in the EPIC chart.  Directed ROS with pertinent positives and negatives documented in the history of present illness/assessment and plan.  Exam: [name] assistant Vitals:   03/13/17 1104  BP: 118/76   General appearance:  Normal Abdomen soft nontender without masses guarding rebound Pelvic external BUS vagina with atrophic changes.  No lesions or any abnormalities noted on exam.  Cervix normal.  Uterus normal size midline mobile no.  Adnexa without masses or tenderness.  Assessment/Plan:  61 y.o. G0P0 with:  1. Dyspareunia with last choroidal episode.  Exam is normal other than atrophic changes.  Patient will continue on her estradiol vaginal cream twice weekly and lubrication with intercourse.  Will represent if any recurrence of her symptoms. 2. Osteoporosis.  T score -2.6 with 5-6% loss at left total hip and 7% loss at the left  femoral neck.  Also shows statistically significant loss at her wrist but remains within the normal range.  Had been treated before with bisphosphate and subsequently Forteo.  Currently on HRT.  I reviewed options with the patient to include reinitiation of bisphosphonate along with her HRT or consider Prolia.  Given her history and her younger age I recommended that she consider consultation with Dr Cruzita Lederer for her input.  Patient agrees with this and will go ahead and arrange this appointment.  Will check baseline vitamin D level today.    [name] MD, 11:27 AM 03/13/2017

## 2017-03-13 NOTE — Telephone Encounter (Signed)
Faxed prescription for Fiorecet to [facility] on [address]. They received the fax.

## 2017-03-13 NOTE — Patient Instructions (Addendum)
Office will call you to arrange for the appointment with Dr Cruzita Lederer  Follow-up if the pain with intercourse recurs

## 2017-03-14 ENCOUNTER — Encounter: Payer: Self-pay | Admitting: Gynecology

## 2017-03-14 ENCOUNTER — Telehealth: Payer: Self-pay | Admitting: *Deleted

## 2017-03-14 ENCOUNTER — Encounter: Payer: Self-pay | Admitting: Neurology

## 2017-03-14 DIAGNOSIS — M818 Other osteoporosis without current pathological fracture: Secondary | ICD-10-CM

## 2017-03-14 LAB — VITAMIN D 25 HYDROXY (VIT D DEFICIENCY, FRACTURES): Vit D, 25-Hydroxy: 55 ng/mL (ref 30–100)

## 2017-03-14 NOTE — Telephone Encounter (Signed)
-----   Message from [name], MD sent at 03/13/2017 11:35 AM EDT ----- Schedule consult appointment with Dr Cruzita Lederer reference osteoporosis.  Records within epic

## 2017-03-14 NOTE — Telephone Encounter (Signed)
Referral placed at [place] office they will contact pt to schedule. Marland Kitchen

## 2017-03-21 NOTE — Telephone Encounter (Signed)
Appointment on 05/22/17

## 2017-04-30 DIAGNOSIS — Z5181 Encounter for therapeutic drug level monitoring: Secondary | ICD-10-CM | POA: Diagnosis not present

## 2017-04-30 DIAGNOSIS — E7800 Pure hypercholesterolemia, unspecified: Secondary | ICD-10-CM | POA: Diagnosis not present

## 2017-05-01 DIAGNOSIS — Z0000 Encounter for general adult medical examination without abnormal findings: Secondary | ICD-10-CM | POA: Diagnosis not present

## 2017-05-22 ENCOUNTER — Ambulatory Visit: Payer: 59 | Admitting: Internal Medicine

## 2017-05-22 ENCOUNTER — Encounter: Payer: Self-pay | Admitting: Internal Medicine

## 2017-05-22 VITALS — BP 104/62 | HR 72 | Ht 66.75 in | Wt 134.0 lb

## 2017-05-22 DIAGNOSIS — M810 Age-related osteoporosis without current pathological fracture: Secondary | ICD-10-CM | POA: Diagnosis not present

## 2017-05-22 NOTE — Progress Notes (Signed)
Patient ID: Yolanda Carroll, female   DOB: April 08, 1956, 62 y.o.   MRN: [id number]    HPI  Yolanda Carroll is a 62 y.o.-year-old female, referred by Dr. Phineas Real, for management of osteoporosis.  Pt was dx with OP in early 2000s.  I reviewed pt's DEXA scans: Date L1-L4 T score FN T score 33% distal Radius (left)  Ultra distal radius (left)  02/06/2017 ([facility], [facility]) -0.3 (moderate scoliosis) RFN: -2.6 LFN: -2.2  +1.1 -0.1  12/29/2014 -0.5 (moderate scoliosis) RFN: -2.6 LFN: -1.7 n/a n/a  12/12/2012 -0.5 (moderate scoliosis) RFN: -2.4 LFN: -1.9 n/a n/a  10/04/2010 n/a RFN: -2.3 LFN: -1.8 n/a n/a   She denies fractures. No dizziness/orthostasis/poor vision. No falls. She had 1 episode of vertigo x 2 weeks - only when raising from a lying down - 6 mo ago.  She has back pain and R hip pain with walking.  Previous OP treatments:  - Actonel in 2006-2007. - Forteo for 2 years in 2008 - Boniva in 2010 - Currently on HRT: Estradiol 0.5, Provera 2.5 H/o steroid courses: po and im: for HAs, OA.  No h/o vitamin D deficiency. Reviewed available vit D levels: 04/30/2017: vit D 53.4 Lab Results  Component Value Date   VD25OH 55 03/13/2017   Pt is on calcium 600 mg 2x a day (chewables); on vitamin D supplements - MVI +  1600 IU.  No weight bearing exercises.   She does not take high vitamin A doses.  Menopause was at 1s y/o.   Pt does have a FH of osteoporosis in mother,  who had hip fracture.  No h/o hyper/hypocalcemia or hyperparathyroidism. No h/o kidney stones. 05/01/2017:  Lab Results  Component Value Date   CALCIUM 9.5 02/21/2012   CALCIUM 9.8 07/10/2011   No h/o thyrotoxicosis. Reviewed TSH recent levels:  05/01/2017: TSH 1.71 04/28/2013: TSH 2.063 No results found for: TSH   No h/o CKD. Last BUN/Cr: 05/01/2017: 15/0.82 04/2013: 14/0.84 Lab Results  Component Value Date   BUN 19 02/21/2012   CREATININE 0.79 02/21/2012   On Depakote for  migraines.  ROS: Constitutional: no weight gain/loss, no fatigue, no subjective hyperthermia/hypothermia Eyes: no blurry vision, no xerophthalmia ENT: no sore throat, no nodules palpated in throat, no dysphagia/odynophagia, no hoarseness, + tinnitus Cardiovascular: no CP/SOB/palpitations/leg swelling Respiratory: no cough/SOB Gastrointestinal: no N/V/D/C Musculoskeletal: no muscle/+ joint aches Skin: no rashes Neurological: no tremors/numbness/tingling/dizziness, + HA Psychiatric: no depression/+ anxiety  Past Medical History:  Diagnosis Date  . Allergy   . Arthritis   . Cervical dysplasia   . Endometriosis   . Fatigue   . GERD (gastroesophageal reflux disease)   . IBS (irritable bowel syndrome)   . Migraine   . Osteoporosis 01/2017   T score -2.6  . Plantar fasciitis   . PONV (postoperative nausea and vomiting)    also difficult to wake up  . Recurrent vaginitis   . Reflux   . Scoliosis   . Wears glasses    Past Surgical History:  Procedure Laterality Date  . ANTERIOR CERVICAL DECOMP/DISCECTOMY FUSION  04/17/2011   Procedure: ANTERIOR CERVICAL DECOMPRESSION/DISCECTOMY FUSION 2 LEVELS;  Surgeon: Hosie Spangle;  Location: [facility];  Service: Neurosurgery;  Laterality: N/A;  Cervical five-six, six-seven anterior cervical decompression with fusion,  plating,  and bonegraft   . APPENDECTOMY  1978  . BREAST BIOPSY  07/13/2011   Procedure: BREAST BIOPSY WITH NEEDLE LOCALIZATION;  Surgeon: Rolm Bookbinder, MD;  Location: [facility];  Service: General;  Laterality: Right;  Right breast wire localization biopsy  . BREAST EXCISIONAL BIOPSY Right    2013  . BREAST SURGERY     Breast Bx-Benign  . CYSTOSCOPY    . GYNECOLOGIC CRYOSURGERY    . HERNIA REPAIR  08/02/1995   RIH  . LAPAROSCOPIC ENDOMETRIOSIS FULGURATION  1997  . PELVIC LAPAROSCOPY    . ROTATOR CUFF REPAIR     right 2002 left 2000   Social History   Socioeconomic History  . Marital status:  Married    Spouse name: Fritz Pickerel  . Number of children: 0  . 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 Smoking status: Former Smoker    Last attempt to quit: 05/17/1983    Years since quitting: 34.0  . Smokeless tobacco: Never Used  Substance and Sexual Activity  . Alcohol use: Yes    Alcohol/week: 0.6 oz    Types: 1-2 Standard drinks or equivalent per week    Comment: socially, occasional  . Drug use: No  . Sexual activity: Yes    Birth control/protection: Post-menopausal    Comment: intercourse age 30 , sexual partners more than 5  Other Topics Concern  . Not on file  Social History Narrative   Pt is married, no children.  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    Caffeine- very little.   Current Outpatient Medications on File Prior to Visit  Medication Sig Dispense Refill  . ALPRAZolam (XANAX) 0.25 MG tablet Take 0.25 mg by mouth daily as needed.     . Ascorbic Acid (VITAMIN C PO) Take 1 tablet by mouth daily. 500mg  capsule    . Calcium Carbonate (CALCIUM 600 PO) Take 1 tablet by mouth 2 (two) times daily.     . Cholecalciferol (VITAMIN D PO) Take 800 Units by mouth 2 (two) times daily.     . divalproex (DEPAKOTE) 500 MG 24 hr tablet Take 500 mg by mouth at bedtime.     Marland Kitchen estradiol (ESTRACE) 0.5 MG tablet TAKE 1 TABLET (0.5 MG TOTAL) BY MOUTH DAILY. 30 tablet 12  . Gabapentin (NEURONTIN PO) Take 100 mg by mouth daily.     Marland Kitchen L-LYSINE PO Take by mouth.    . magnesium 30 MG tablet Take 30 mg by mouth 2 (two) times daily.    . medroxyPROGESTERone (PROVERA) 2.5 MG tablet TAKE ONE TABLET BY MOUTH EVERY NIGHT AT BEDTIME 30 tablet 11  . meloxicam (MOBIC) 7.5 MG tablet Take 7.5 mg by mouth daily.     . Multiple Vitamin (MULTIVITAMIN) tablet Take 1 tablet by mouth daily.    . nefazodone (SERZONE) 150 MG tablet Take 150 mg by mouth daily.    . NONFORMULARY OR COMPOUNDED  ITEM Estradiol vaginal cream 0.02% insert vaginally 3 times weekly 90 each 3  . pantoprazole (PROTONIX) 20 MG tablet Take 20 mg by mouth 2 (two) times daily.    . vitamin E 400 UNIT capsule Take 200 Units by mouth daily.     . baclofen (LIORESAL) 10 MG tablet 10 mg as needed.    Marland Kitchen BEPREVE 1.5 % SOLN     . butalbital-acetaminophen-caffeine (FIORICET, ESGIC) 50-325-40 MG tablet Take 1 tablet by mouth every 6 (six) hours as needed for headache. (Patient not taking: Reported on 05/22/2017) 15 tablet 3  . lidocaine (LIDODERM) 5 % Place onto the skin as needed. Back  pain    . meclizine (ANTIVERT) 25 MG tablet Take 25 mg by mouth 3 (three) times daily as needed for dizziness.    . SUMAtriptan (IMITREX) 50 MG tablet Take 50 mg by mouth every 2 (two) hours as needed for migraine. May repeat in 2 hours if headache persists or recurs.    . valACYclovir (VALTREX) 500 MG tablet Take 500 mg by mouth 2 (two) times daily as needed.     No current facility-administered medications on file prior to visit.    Allergies  Allergen Reactions  . Penicillins Other (See Comments)    Seizure as a child  . Atarax [Hydroxyzine] Other (See Comments)    Headache, depression  . Celexa [Citalopram Hydrobromide] Other (See Comments)    Chest pain  . Erythromycin      Upset stomache  . Nortriptyline Itching  . Prilosec [Omeprazole]     Abdominal pain  . Requip [Ropinirole]     Made sx worse  . Tramadol Itching  . Darvocet [Propoxyphene N-Acetaminophen] Itching  . Percocet [Oxycodone-Acetaminophen] Itching   Family History  Problem Relation Age of Onset  . Cancer Father        lymphoma  . Other Mother        bipolar,reflux  . Bipolar disorder Mother   . Other Brother        sinus problems  . Cancer Maternal Aunt        uterine cancer  . Breast cancer Maternal Aunt        40's  . Diabetes Maternal Aunt   . Cancer Paternal Aunt        Colon cancer  . Breast cancer Cousin 5       Mat. 1st cousin     PE: BP 104/62   Pulse 72   Ht 5' 6.75" (1.695 m)   Wt 134 lb (60.8 kg)   SpO2 98%   BMI 21.14 kg/m  Wt Readings from Last 3 Encounters:  05/22/17 134 lb (60.8 kg)  03/12/17 136 lb 3.2 oz (61.8 kg)  01/08/17 134 lb 12.8 oz (61.1 kg)   Constitutional: normal weight, in NAD. No kyphosis. + visible scoliosis. Eyes: PERRLA, EOMI, no exophthalmos ENT: moist mucous membranes, no thyromegaly, no cervical lymphadenopathy Cardiovascular: RRR, No MRG Respiratory: CTA B Gastrointestinal: abdomen soft, NT, ND, BS+ Musculoskeletal: no deformities, strength intact in all 4 Skin: moist, warm, no rashes Neurological: no tremor with outstretched hands, DTR normal in all 4  Assessment: 1. Osteoporosis  Plan: 1. Osteoporosis - likely postmenopausal, she has FH of OP - Discussed about increased risk of fracture, depending on the T score, greatly increased when the T score is lower than -2.5, but it is actually a continuum and -2.5 should not be regarded as an absolute threshold. We reviewed her latest 4 DEXA scan reports together (LFN has decreased significantly inj last 2 years; spine is uninterpretable, and UD/33%distal radius T scores are normal). I explained that based on the T scores at the levels of her hips, she has an increased risk for fractures.  - we reviewed her dietary and supplemental calcium and vitamin D intake. I recommended to make sure she gets 1000-1200 mg of calcium daily preferentially from diet.  She is currently taking 600 mg calcium twice a day + what she gets from the diet, and I advised her to reduce the supplement to 600 mg only once a day; we reviewed her most recent vitamin D level, which was adequate, at  20.  She continues supplementation with approximately 2000 units vitamin D daily. - discussed fall precautions   - given handout from [place] [facility]: weight bearing exercises - advised to do this every day or at least 5/7 days - we discussed about  maintaining a good amount of protein in her diet. The recommended daily protein intake is ~0.8 g per kilogram per day (for her approximately 50 g daily - discussed about the best sources). I advised her to try to aim for this amount, since a diet low in proteins can exacerbate osteoporosis. Also, avoid smoking or >2 drinks of alcohol a day. - I recommended the following book:  - We discussed about the different medication classes, benefits and side effects (including atypical fractures and ONJ - no dental workup in progress or planned).  - I explained that, since she tried these before, I would not recommend again oral bisphosphonates, so my first choice would be sq denosumab (Prolia) for 6-10 years, then zoledronic acid (iv Reclast) for 1-2 years. Since she already had 2 years of Teriparatide, we cannot use this again. Pt was given reading information about Prolia, and I explained the mechanism of action and expected benefits.  - she will let me know if she decides to start Prolia inj - will check a new DEXA scan in 2 years after starting Prolia -  I explained that the first indication that the treatment is working is her not having anymore fractures. DEXA scan changes are secondary: unchanged or slightly higher T-scores are desirable - will see pt back in a year  Philemon Kingdom, MD PhD [facility] Endocrinology

## 2017-05-22 NOTE — Patient Instructions (Addendum)
Please think about Prolia and let me know what you decide.  Discuss with Dr. Phineas Real Re: coming off HRT.  Try to keep your total calcium intake at 1200 mg daily. Calcium citrate is better absorbed.  Continue vitamin D 1600 units + multivitamin.  Please come back for a follow-up appointment in 1 year.  How Can I Prevent Falls? Men and women with osteoporosis need to take care not to fall down. Falls can break bones. Some reasons people fall are: Poor vision  Poor balance  Certain diseases that affect how you walk  Some types of medicine, such as sleeping pills.  Some tips to help prevent falls outdoors are: Use a cane or walker  Wear rubber-soled shoes so you don't slip  Walk on grass when sidewalks are slippery  In winter, put salt or kitty litter on icy sidewalks.  Some ways to help prevent falls indoors are: Keep rooms free of clutter, especially on floors  Use plastic or carpet runners on slippery floors  Wear low-heeled shoes that provide good support  Do not walk in socks, stockings, or slippers  Be sure carpets and area rugs have skid-proof backs or are tacked to the floor  Be sure stairs are well lit and have rails on both sides  Put grab bars on bathroom walls near tub, shower, and toilet  Use a rubber bath mat in the shower or tub  Keep a flashlight next to your bed  Use a sturdy step stool with a handrail and wide steps  Add more lights in rooms (and night lights) Buy a cordless phone to keep with you so that you don't have to rush to the phone       when it rings and so that you can call for help if you fall.   (adapted from http://www.niams.Nightlife[employer]views.se)  Please check out the following book about best diet for bone health:   Exercise for Strong Bones (from [place]) There are two types of exercises that are important for building and maintaining bone density:  weight-bearing and  muscle-strengthening exercises. Weight-bearing Exercises These exercises include activities that make you move against gravity while staying upright. Weight-bearing exercises can be high-impact or low-impact. High-impact weight-bearing exercises help build bones and keep them strong. If you have broken a bone due to osteoporosis or are at risk of breaking a bone, you may need to avoid high-impact exercises. If youre not sure, you should check with your healthcare provider. Examples of high-impact weight-bearing exercises are:  Dancing  Doing high-impact aerobics  Hiking  Jogging/running  Jumping Rope  Stair climbing  Tennis Low-impact weight-bearing exercises can also help keep bones strong and are a safe alternative if you cannot do high-impact exercises. Examples of low-impact weight-bearing exercises are:  Using elliptical training machines  Doing low-impact aerobics  Using stair-step machines  Fast walking on a treadmill or outside Muscle-Strengthening Exercises These exercises include activities where you move your body, a weight or some other resistance against gravity. They are also known as resistance exercises and include:  Lifting weights  Using elastic exercise bands  Using weight machines  Lifting your own body weight  Functional movements, such as standing and rising up on your toes Yoga and Pilates can also improve strength, balance and flexibility. However, certain positions may not be safe for people with osteoporosis or those at increased risk of broken bones. For example, exercises that have you bend forward may increase the chance of breaking a bone in  the spine. A physical therapist should be able to help you learn which exercises are safe and appropriate for you. Non-Impact Exercises Non-impact exercises can help you to improve balance, posture and how well you move in everyday activities. These exercises can also help to increase muscle strength and  decrease the risk of falls and broken bones. Some of these exercises include:  Balance exercises that strengthen your legs and test your balance, such as Tai Chi, can decrease your risk of falls.  Posture exercises that improve your posture and reduce rounded or sloping shoulders can help you decrease the chance of breaking a bone, especially in the spine.  Functional exercises that improve how well you move can help you with everyday activities and decrease your chance of falling and breaking a bone. For example, if you have trouble getting up from a chair or climbing stairs, you should do these activities as exercises. A physical therapist can teach you balance, posture and functional exercises. Starting a New Exercise Program If you havent exercised regularly for a while, check with your healthcare provider before beginning a new exercise program--particularly if you have health problems such as heart disease, diabetes or high blood pressure. If youre at high risk of breaking a bone, you should work with a physical therapist to develop a safe exercise program. Once you have your healthcare providers approval, start slowly. If youve already broken bones in the spine because of osteoporosis, be very careful to avoid activities that require reaching down, bending forward, rapid twisting motions, heavy lifting and those that increase your chance of a fall. As you get started, your muscles may feel sore for a day or two after you exercise. If soreness lasts longer, you may be working too hard and need to ease up. Exercises should be done in a pain-free range of motion. How Much Exercise Do You Need? Weight-bearing exercises 30 minutes on most days of the week. Do a 30-minutesession or multiple sessions spread out throughout the day. The benefits to your bones are the same.   Muscle-strengthening exercises Two to three days per week. If you dont have much time for strengthening/resistance training,  do small amounts at a time. You can do just one body part each day. For example do arms one day, legs the next and trunk the next. You can also spread these exercises out during your normal day.  Balance, posture and functional exercises Every day or as often as needed. You may want to focus on one area more than the others. If you have fallen or lose your balance, spend time doing balance exercises. If you are getting rounded shoulders, work more on posture exercises. If you have trouble climbing stairs or getting up from the couch, do more functional exercises. You can also perform these exercises at one time or spread them during your day. Work with a phyiscal therapist to learn the right exercises for you.   Denosumab: Patient drug information (Up-to-date) Copyright 8197103668 [place] rights reserved.  Brand Names: [place]  ProliaDelton See What is this drug used for?  It is used to treat soft, brittle bones (osteoporosis).  It is used for bone growth.  It is used when treating some cancers.  It may be given to you for other reasons. Talk with the doctor. What do I need to tell my doctor BEFORE I take this drug?  All products:  If you have an allergy to denosumab or any other part of this drug.  If you are allergic to any drugs like this one, any other drugs, foods, or other substances. Tell your doctor about the allergy and what signs you had, like rash; hives; itching; shortness of breath; wheezing; cough; swelling of face, lips, tongue, or throat; or any other signs.  If you have low calcium levels.  Prolia:  If you are pregnant or may be pregnant. Do not take this drug if you are pregnant.  This is not a list of all drugs or health problems that interact with this drug.  Tell your doctor and pharmacist about all of your drugs (prescription or OTC, natural products, vitamins) and health problems. You must check to make sure that it is safe for you to take this drug with all  of your drugs and health problems. Do not start, stop, or change the dose of any drug without checking with your doctor. What are some things I need to know or do while I take this drug?  All products:  Tell dentists, surgeons, and other doctors that you use this drug.  This drug may raise the chance of a broken leg. Talk with your doctor.  Have your blood work checked. Talk with your doctor.  Have a bone density test. Talk with your doctor.  Take calcium and vitamin D as you were told by your doctor.  Have a dental exam before starting this drug.  Take good care of your teeth. See a dentist often.  If you smoke, talk with your doctor.  Do not give to a child. Talk with your doctor.  Tell your doctor if you are breast-feeding. You will need to talk about any risks to your baby.  Delton See:  This drug may cause harm to the unborn baby if you take it while you are pregnant. If you get pregnant while taking this drug, call your doctor right away.  Prolia:  Very bad infections have been reported with use of this drug. If you have any infection, are taking antibiotics now or in the recent past, or have many infections, talk with your doctor.  You may have more chance of getting an infection. Wash hands often. Stay away from people with infections, colds, or flu.  Use birth control that you can trust to prevent pregnancy while taking this drug.  If you are a man and your sex partner is pregnant or gets pregnant at any time while you are being treated, talk with your doctor. What are some side effects that I need to call my doctor about right away?  WARNING/CAUTION: Even though it may be rare, some people may have very bad and sometimes deadly side effects when taking a drug. Tell your doctor or get medical help right away if you have any of the following signs or symptoms that may be related to a very bad side effect:  All products:  Signs of an allergic reaction, like rash; hives;  itching; red, swollen, blistered, or peeling skin with or without fever; wheezing; tightness in the chest or throat; trouble breathing or talking; unusual hoarseness; or swelling of the mouth, face, lips, tongue, or throat.  Signs of low calcium levels like muscle cramps or spasms, numbness and tingling, or seizures.  Mouth sores.  Any new or strange groin, hip, or thigh pain.  This drug may cause jawbone problems. The chance may be higher the longer you take this drug. The chance may be higher if you have cancer, dental problems, dentures that do not fit well,  anemia, blood clotting problems, or an infection. The chance may also be higher if you are having dental work or if you are getting chemo, some steroid drugs, or radiation. Call your doctor right away if you have jaw swelling or pain.  Xgeva:  Not hungry.  Muscle pain or weakness.  Seizures.  Shortness of breath.  Prolia:  Signs of infection. These include a fever of 100.99F (38C) or higher, chills, very bad sore throat, ear or sinus pain, cough, more sputum or change in color of sputum, pain with passing urine, mouth sores, wound that will not heal, or anal itching or pain.  Signs of a pancreas problem (pancreatitis) like very bad stomach pain, very bad back pain, or very bad upset stomach or throwing up.  Chest pain.  A heartbeat that does not feel normal.  Very bad skin irritation.  Feeling very tired or weak.  Bladder pain or pain when passing urine or change in how much urine is passed.  Passing urine often.  Swelling in the arms or legs. What are some other side effects of this drug?  All drugs may cause side effects. However, many people have no side effects or only have minor side effects. Call your doctor or get medical help if any of these side effects or any other side effects bother you or do not go away:  Xgeva:  Feeling tired or weak.  Headache.  Upset stomach or throwing up.  Loose stools  (diarrhea).  Cough.  Prolia:  Back pain.  Muscle or joint pain.  Sore throat.  Runny nose.  Pain in arms or legs.  These are not all of the side effects that may occur. If you have questions about side effects, call your doctor. Call your doctor for medical advice about side effects.  You may report side effects to your national health agency. How is this drug best taken?  Use this drug as ordered by your doctor. Read and follow the dosing on the label closely.  It is given as a shot into the fatty part of the skin. What do I do if I miss a dose?  Call the doctor to find out what to do. How do I store and/or throw out this drug?  This drug will be given to you in a hospital or doctor's office. You will not store it at home.  Keep all drugs out of the reach of children and pets.  Check with your pharmacist about how to throw out unused drugs.  General drug facts  If your symptoms or health problems do not get better or if they become worse, call your doctor.  Do not share your drugs with others and do not take anyone else's drugs.  Keep a list of all your drugs (prescription, natural products, vitamins, OTC) with you. Give this list to your doctor.  Talk with the doctor before starting any new drug, including prescription or OTC, natural products, or vitamins.  Some drugs may have another patient information leaflet. If you have any questions about this drug, please talk with your doctor, pharmacist, or other health care provider.  If you think there has been an overdose, call your poison control center or get medical care right away. Be ready to tell or show what was taken, how much, and when it happened.

## 2017-06-20 DIAGNOSIS — M5126 Other intervertebral disc displacement, lumbar region: Secondary | ICD-10-CM | POA: Diagnosis not present

## 2017-06-20 DIAGNOSIS — Z6820 Body mass index (BMI) 20.0-20.9, adult: Secondary | ICD-10-CM | POA: Diagnosis not present

## 2017-06-20 DIAGNOSIS — M545 Low back pain: Secondary | ICD-10-CM | POA: Diagnosis not present

## 2017-06-20 DIAGNOSIS — M47892 Other spondylosis, cervical region: Secondary | ICD-10-CM | POA: Diagnosis not present

## 2017-06-20 DIAGNOSIS — M791 Myalgia, unspecified site: Secondary | ICD-10-CM | POA: Diagnosis not present

## 2017-07-09 ENCOUNTER — Other Ambulatory Visit: Payer: Self-pay | Admitting: Gynecology

## 2017-07-09 DIAGNOSIS — Z1231 Encounter for screening mammogram for malignant neoplasm of breast: Secondary | ICD-10-CM

## 2017-07-11 ENCOUNTER — Ambulatory Visit: Payer: 59 | Admitting: Neurology

## 2017-07-11 ENCOUNTER — Encounter: Payer: Self-pay | Admitting: Neurology

## 2017-07-11 VITALS — BP 90/56 | HR 71 | Ht 68.0 in | Wt 132.0 lb

## 2017-07-11 DIAGNOSIS — G43709 Chronic migraine without aura, not intractable, without status migrainosus: Secondary | ICD-10-CM | POA: Diagnosis not present

## 2017-07-11 MED ORDER — FREMANEZUMAB-VFRM 225 MG/1.5ML ~~LOC~~ SOSY: 1.0000 | PREFILLED_SYRINGE | SUBCUTANEOUS | 0 refills | Status: DC

## 2017-07-11 MED ORDER — DIHYDROERGOTAMINE MESYLATE 4 MG/ML NA SOLN: NASAL | 12 refills | Status: DC

## 2017-07-11 MED ORDER — FREMANEZUMAB-VFRM 225 MG/1.5ML ~~LOC~~ SOSY: 225.0000 mg | PREFILLED_SYRINGE | SUBCUTANEOUS | 11 refills | Status: DC

## 2017-07-11 NOTE — Patient Instructions (Addendum)
Do not use Migranal with any other triptan   Fremanezumab: Patient drug information [employer] here. Copyright [phone] [employer] All rights reserved. (For additional information see "Fremanezumab: Drug information") Brand Names: [place]  Ajovy  What is this drug used for?   It is used to prevent migraine headaches.  What do I need to tell my doctor BEFORE I take this drug?   If you have an allergy to this drug or any part of this drug.   If you are allergic to any drugs like this one, any other drugs, foods, or other substances. Tell your doctor about the allergy and what signs you had, like rash; hives; itching; shortness of breath; wheezing; cough; swelling of face, lips, tongue, or throat; or any other signs.   This drug may interact with other drugs or health problems.   Tell your doctor and pharmacist about all of your drugs (prescription or OTC, natural products, vitamins) and health problems. You must check to make sure that it is safe for you to take this drug with all of your drugs and health problems. Do not start, stop, or change the dose of any drug without checking with your doctor.  What are some things I need to know or do while I take this drug?   Tell all of your health care providers that you take this drug. This includes your doctors, nurses, pharmacists, and dentists.   Allergic reactions have happened within hours and up to 1 month after this drug was given. Tell your doctor right away about any bad effects after getting this drug.   Tell your doctor if you are pregnant or plan on getting pregnant. You will need to talk about the benefits and risks of using this drug while you are pregnant.   Tell your doctor if you are breast-feeding. You will need to talk about any risks to your baby.  What are some side effects that I need to call my doctor about right away?   WARNING/CAUTION: Even though it may be rare, some people may have very bad and sometimes  deadly side effects when taking a drug. Tell your doctor or get medical help right away if you have any of the following signs or symptoms that may be related to a very bad side effect:   Signs of an allergic reaction, like rash; hives; itching; red, swollen, blistered, or peeling skin with or without fever; wheezing; tightness in the chest or throat; trouble breathing, swallowing, or talking; unusual hoarseness; or swelling of the mouth, face, lips, tongue, or throat.   Very bad irritation where the shot was given.  What are some other side effects of this drug?   All drugs may cause side effects. However, many people have no side effects or only have minor side effects. Call your doctor or get medical help if any of these side effects or any other side effects bother you or do not go away:   Irritation where the shot is given.   These are not all of the side effects that may occur. If you have questions about side effects, call your doctor. Call your doctor for medical advice about side effects.   You may report side effects to your national health agency.  How is this drug best taken?   Use this drug as ordered by your doctor. Read all information given to you. Follow all instructions closely.   It is given as a shot into the fatty part of  the skin on the top of the thigh, belly area, or upper arm.   If you will be giving yourself the shot, your doctor or nurse will teach you how to give the shot.   Follow how to use as you have been told by the doctor or read the package insert.   Wash your hands before use.   If stored in a refrigerator, let this drug come to room temperature before using it. Leave it at room temperature for at least 30 minutes. Do not heat this drug.   Do not shake.   Do not give into skin that is irritated, bruised, red, infected, or scarred.   Do not give into the same place as another shot.   If your dose is more than 1 injection, you may give it in the same body part.  Make sure you do not give injections in the same spot you used for the other injections.   Do not use if the solution is cloudy, leaking, or has particles.   This drug is colorless to a faint yellow. Do not use if the solution changes color.   Each prefilled syringe is for one use only.   Throw away needles in a needle/sharp disposal box. Do not reuse needles or other items. When the box is full, follow all local rules for getting rid of it. Talk with a doctor or pharmacist if you have any questions.  What do I do if I miss a dose?   Take a missed dose as soon as you think about it.   After taking a missed dose, start a new schedule based on when the dose is taken.  How do I store and/or throw out this drug?   Store in a refrigerator. Do not freeze.   Store in the carton to protect from light.   If needed, this drug can be left out at room temperature for up to 24 hours. Throw away drug if left at room temperature for more than 24 hours.   Protect from heat and sunlight.   Keep all drugs in a safe place. Keep all drugs out of the reach of children and pets.   Throw away unused or expired drugs. Do not flush down a toilet or pour down a drain unless you are told to do so. Check with your pharmacist if you have questions about the best way to throw out drugs. There may be drug take-back programs in your area.  General drug facts   If your symptoms or health problems do not get better or if they become worse, call your doctor.   Do not share your drugs with others and do not take anyone else's drugs.   Keep a list of all your drugs (prescription, natural products, vitamins, OTC) with you. Give this list to your doctor.   Talk with the doctor before starting any new drug, including prescription or OTC, natural products, or vitamins.   Some drugs may have another patient information leaflet. If you have any questions about this drug, please talk with your doctor, nurse, pharmacist, or other health  care provider.   If you think there has been an overdose, call your poison control center or get medical care right away. Be ready to tell or show what was taken, how much, and when it happened.   Dihydroergotamine nasal spray What is this medicine? DIHYDROERGOTAMINE (dye hye droe er GOT a meen) is part of a group of medicines called ergot alkaloids.  It is used to treat migraine headaches with or without aura. It should not be used to prevent migraine headaches. This medicine may be used for other purposes; ask your health care provider or pharmacist if you have questions. COMMON BRAND NAME(S): Migranal What should I tell my health care provider before I take this medicine? They need to know if you have any of these conditions: -chest pain or difficulty breathing -heart or blood vessel disease -high blood pressure -infection -kidney disease -liver disease -poor circulation -risk factors for heart disease such as smoking, high cholesterol, a family history of heart disease, or if you are postmenopausal or a female over 17 years of age -an unusual or allergic reaction to dihydroergotamine, ergot alkaloids, other medicines, foods, dyes, or preservatives -pregnant or trying to get pregnant -breast-feeding How should I use this medicine? This medicine is for use in the nose. Follow the directions on the prescription label. This medicine is given at the first symptoms of a migraine. It is not for everyday use. You must prepare the nasal spray only when you are ready to use it. Follow the instructions that come with your prescription or contact your doctor or health care professional if you are unsure how to do this. Throw away the sprayer after completing the full dose. Each unit is only good for eight hours once opened. Do not use this medicine more often than directed. Talk to your pediatrician regarding the use of this medicine in children. Special care may be needed. Overdosage: If you think you  have taken too much of this medicine contact a poison control center or emergency room at once. NOTE: This medicine is only for you. Do not share this medicine with others. What if I miss a dose? This does not apply; this medicine is not for regular use. What may interact with this medicine? Do not take this medicine with any of the following medications: -antifungal drugs like fluconazole, itraconazole, ketoconazole or voriconazole -certain antibiotics like erythromycin, clarithromycin, and troleandomycin -cocaine -conivaptan -dexfenfluramine -ephedrine -feverfew -grapefruit juice -imatinib -isoproterenol -medicines called nitrates like isosorbide and nitroglycerin -medicines for colds, flu, or breathing difficulties like phenylephrine and pseudoephedrine -medicines for migraine headache like almotriptan, eletriptan, frovatriptan, naratriptan, rizatriptan, sumatriptan, and zolmitriptan -midodrine -nefazodone -other ergot alkaloids like bromocriptine, cabergoline, dihydroergotamine, ergoloid mesylates, ergonovine, methylergonovine, and methysergide -some medicines for HIV This medicine may also interact with the following medications: -clotrimazole -fluoxetine -fluvoxamine -medicines for high blood pressure, especially beta-blockers -metronidazole -nicotine -zileuton This list may not describe all possible interactions. Give your health care provider a list of all the medicines, herbs, non-prescription drugs, or dietary supplements you use. Also tell them if you smoke, drink alcohol, or use illegal drugs. Some items may interact with your medicine. What should I watch for while using this medicine? Check with your doctor or health care professional if you do not get relief from your headaches after using this medicine. You may need to be changed to a different kind of medicine to treat your migraines. You may get drowsy or dizzy. Do not drive, use machinery, or do anything that  needs mental alertness until you know how this medicine affects you. To reduce dizzy or fainting spells, do not sit or stand up quickly, especially if you are an older patient. Alcohol can increase drowsiness, dizziness and flushing. Avoid alcoholic drinks. This medicine decreases the circulation of blood to your skin, fingers, and toes. You may get more sensitive to the cold. Elderly patients are more  likely to feel this effect. Dress warmly and avoid long exposure to the cold. What side effects may I notice from receiving this medicine? Side effects that you should report to your doctor or health care professional as soon as possible: -allergic reactions like skin rash, itching or hives, swelling of the face, lips, or tongue -chest pain -cold hands or feet -fast, irregular heartbeat -leg or arm pain, cramps -swelling of hands, ankles, or feet -tingling, pain or numbness in feet or hands -vomiting -weakness in legs Side effects that usually do not require medical attention (report to your doctor or health care professional if they continue or are bothersome): -changes in the taste of food -nasal congestion or sore throat -nausea This list may not describe all possible side effects. Call your doctor for medical advice about side effects. You may report side effects to FDA at 1-800-FDA-1088. Where should I keep my medicine? Keep out of the reach of children. Store at room temperature below 25 degrees C (77 degrees F). Protect from light, moisture, and heat. Do not refrigerate or freeze. Keep the parts of the nasal spray in the tray provided. Keep this tray loaded in the assembly case. Do not keep an opened nasal spray for more than 8 hours. Throw away any unopened medicine after the expiration date. NOTE: This sheet is a summary. It may not cover all possible information. If you have questions about this medicine, talk to your doctor, pharmacist, or health care provider.  2018 [employer] (2007-08-20 17:17:14)

## 2017-07-11 NOTE — Progress Notes (Signed)
[facility]    Provider:  Dr Jaynee Eagles Referring Provider: Jonathon Jordan, MD Primary Care Physician:  Jonathon Jordan, MD  CC:  Migraine  Tried her on a combination of meds for acute management (cambia, zofran, relpax) was also taking imitrex and baclofen. Tramadol in hte past did not help. Tried Fioricet at last appointment, made her dizzy and made her itch. Discussed trying other medications. Discussed oral medications.   Interval history 03/12/2017: Tried a combination of medications, cambia, relpax and zofran. Cambia alone did not help. Zofran and relpax did not help when she got home. Took all three together later in the mirgaine didn;t help. But also tried it at onset of headache and did not stop the migraine. She had her last migraine at the end of August until the 17th of September. Then October 24th had another headache and the tylenol worked. Lasted a few days, baclofen for neck pain but did not stop it until the 26th-28th. 63 days has had 41 headache free days. In 2 months had 22 migraine days. Season may also trigger. She has tried imitrex PO. Tramadol shot in the past had not helped, migraine started on 6/27 and imitrex did not help but she had the headache for several days so this was not at onset. Tried cambia.   HPI:  Yolanda Carroll is a 62 y.o. female here as a referral from Dr. Stephanie Acre for migraines. She is currently on Depakote, meloxicam, Imitrex, baclofen. She has a past medical history of osteoporosis, plantar fasciitis, migraine, irritable bowel syndrome, fatigue, neck and back pain with degenerative disc disease and radiculopathy, arthritis. She has had migraines since 2000. Started worsening in October. They can last up to 2 weeks straight. She has associated vertigo. She has done well on nefazodone and depakote. The next headache was in June and lasted 2 weeks. It hurts behind the eyes, she can still function but is moderate in pain, weather triggers,  she has light sensitivity, no significant nausea or vomiting. She has had nausea and vomiting in the past. Depakote is working for her. Slowly progressive when they start. No medication overuse. No other focal neurologic deficits, associated symptoms, inciting events or modifiable factors.  Meds tried: Depakote, Imitrex, baclofen, Nortriptyline, Gabapentin, Nefazodone, Skelaxin, ketaprofen,   Reviewed notes, labs and imaging from outside physicians, which showe:   Personally reviewed imaging and agree with following  MRI cervical spine 07/2013: 1. Mild worsening of the foraminal impingement at C7-T1 due to progressive spondylosis and degenerative disc disease. 2. Mild left foraminal impingement at C3-4, C5-6, and C6-7 primarily due to spurring. Although there is some residual left foraminal impingement at the postoperative levels, the degree of impingement at these levels is much less than on the preoperative exam.  Review of Systems: Patient complains of symptoms per HPI as well as the following symptoms: ringing in ears, anxiety. Pertinent negatives and positives per HPI. All others negative.  Review of Systems: Patient complains of symptoms per HPI as well as the following symptoms:headache. Pertinent negatives and positives per HPI. All others negative.   Social History   Socioeconomic History  . Marital status: Married    Spouse name: Fritz Pickerel  . Number of children: 0  . Years of education: 62  . Highest education level: Not on file  Social Needs  . Financial resource strain: Not on file  . Food insecurity - worry: Not on file  . Food insecurity - inability: Not on file  . Transportation needs -  medical: Not on file  . Transportation needs - non-medical: Not on file  Occupational History    Comment: Center for [employer]  Tobacco Use  . Smoking status: Former Smoker    Last attempt to quit: 05/17/1983    Years since quitting: 34.1  . Smokeless tobacco: Never  Used  Substance and Sexual Activity  . Alcohol use: Yes    Alcohol/week: 0.6 oz    Types: 1 Standard drinks or equivalent per week    Comment: socially, occasional  . Drug use: No  . Sexual activity: Yes    Birth control/protection: Post-menopausal    Comment: intercourse age 13 , sexual partners more than 5  Other Topics Concern  . Not on file  Social History Narrative   Pt is married, no children.  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    Caffeine- very little.   Right handed    Family History  Problem Relation Age of Onset  . Cancer Father        lymphoma  . Other Mother        bipolar,reflux  . Bipolar disorder Mother   . Other Brother        sinus problems  . Cancer Maternal Aunt        uterine cancer  . Breast cancer Maternal Aunt        40's  . Diabetes Maternal Aunt   . Cancer Paternal Aunt        Colon cancer  . Breast cancer Cousin 87       Mat. 1st cousin    Past Medical History:  Diagnosis Date  . Allergy   . Arthritis   . Cervical dysplasia   . Endometriosis   . Fatigue   . GERD (gastroesophageal reflux disease)   . IBS (irritable bowel syndrome)   . Migraine   . Osteoporosis 01/2017   T score -2.6  . Plantar fasciitis   . PONV (postoperative nausea and vomiting)    also difficult to wake up  . Recurrent vaginitis   . Reflux   . Scoliosis   . Wears glasses     Past Surgical History:  Procedure Laterality Date  . ANTERIOR CERVICAL DECOMP/DISCECTOMY FUSION  04/17/2011   Procedure: ANTERIOR CERVICAL DECOMPRESSION/DISCECTOMY FUSION 2 LEVELS;  Surgeon: Hosie Spangle;  Location: [facility];  Service: Neurosurgery;  Laterality: N/A;  Cervical five-six, six-seven anterior cervical decompression with fusion,  plating,  and bonegraft   . APPENDECTOMY  1978  . BREAST BIOPSY  07/13/2011   Procedure: BREAST BIOPSY WITH NEEDLE LOCALIZATION;  Surgeon: Rolm Bookbinder, MD;  Location: [facility];  Service: General;   Laterality: Right;  Right breast wire localization biopsy  . BREAST EXCISIONAL BIOPSY Right    2013  . BREAST SURGERY     Breast Bx-Benign  . CYSTOSCOPY    . GYNECOLOGIC CRYOSURGERY    . HERNIA REPAIR  08/02/1995   RIH  . LAPAROSCOPIC ENDOMETRIOSIS FULGURATION  1997  . PELVIC LAPAROSCOPY    . ROTATOR CUFF REPAIR     right 2002 left 2000    Current Outpatient Medications  Medication Sig Dispense Refill  . ALPRAZolam (XANAX) 0.25 MG tablet Take 0.25 mg by mouth daily as needed.     . Ascorbic Acid (VITAMIN C PO) Take 1 tablet by mouth daily. 500mg  capsule    . baclofen (LIORESAL) 10 MG tablet 10 mg as needed.    Marland Kitchen BEPREVE 1.5 % SOLN     .  Calcium Carbonate (CALCIUM 600 PO) Take 1 tablet by mouth 2 (two) times daily.     . Cholecalciferol (VITAMIN D PO) Take 800 Units by mouth daily.     . divalproex (DEPAKOTE) 500 MG 24 hr tablet Take 500 mg by mouth at bedtime.     Marland Kitchen estradiol (ESTRACE) 0.5 MG tablet TAKE 1 TABLET (0.5 MG TOTAL) BY MOUTH DAILY. 30 tablet 12  . Gabapentin (NEURONTIN PO) Take 100 mg by mouth daily.     Marland Kitchen L-LYSINE PO Take 1 tablet by mouth daily.     Marland Kitchen lidocaine (LIDODERM) 5 % Place onto the skin as needed. Back pain    . magnesium 30 MG tablet Take 30 mg by mouth every other day.     . meclizine (ANTIVERT) 25 MG tablet Take 25 mg by mouth 3 (three) times daily as needed for dizziness.    . medroxyPROGESTERone (PROVERA) 2.5 MG tablet TAKE ONE TABLET BY MOUTH EVERY NIGHT AT BEDTIME 30 tablet 11  . meloxicam (MOBIC) 7.5 MG tablet Take 7.5 mg by mouth daily.     . Multiple Vitamin (MULTIVITAMIN) tablet Take 1 tablet by mouth daily.    . nefazodone (SERZONE) 150 MG tablet Take 150 mg by mouth daily.    . NONFORMULARY OR COMPOUNDED ITEM Estradiol vaginal cream 0.02% insert vaginally 3 times weekly 90 each 3  . pantoprazole (PROTONIX) 20 MG tablet Take 20 mg by mouth 2 (two) times daily.    . valACYclovir (VALTREX) 500 MG tablet Take 500 mg by mouth 2 (two) times daily as  needed.    . vitamin E 400 UNIT capsule Take 200 Units by mouth daily.     Marland Kitchen dihydroergotamine (MIGRANAL) 4 MG/ML nasal spray Use one spray in each nostril at onset of headache. May repeat in 15 minutes.  No more than 4 sprays in one hour. Max 6 sprays in 24 hours. 8 mL 12  . Fremanezumab-vfrm (AJOVY) 225 MG/1.5ML SOSY Inject 225 mg into the skin every 30 (thirty) days. 1 Syringe 11   No current facility-administered medications for this visit.     Allergies as of 07/11/2017 - Review Complete 07/11/2017  Allergen Reaction Noted  . Penicillins Other (See Comments) 11/30/2010  . Atarax [hydroxyzine] Other (See Comments) 01/08/2017  . Celexa [citalopram hydrobromide] Other (See Comments) 02/21/2012  . Erythromycin  01/05/2017  . Nortriptyline Itching 04/17/2011  . Prilosec [omeprazole]  01/05/2017  . Requip [ropinirole]  01/05/2017  . Tramadol Itching 04/05/2011  . Darvocet [propoxyphene n-acetaminophen] Itching 11/30/2010  . Percocet [oxycodone-acetaminophen] Itching 11/30/2010    Vitals: BP (!) 90/56 (BP Location: Right Arm, Patient Position: Sitting)   Pulse 71   Ht 5\' 8"  (1.727 m)   Wt 132 lb (59.9 kg)   BMI 20.07 kg/m  Last Weight:  Wt Readings from Last 1 Encounters:  07/11/17 132 lb (59.9 kg)   Last Height:   Ht Readings from Last 1 Encounters:  07/11/17 5\' 8"  (1.727 m)   Physical exam: Exam: Gen: NAD, conversant, well nourised, obese, well groomed                     CV: RRR, no MRG. No Carotid Bruits. No peripheral edema, warm, nontender Eyes: Conjunctivae clear without exudates or hemorrhage  Neuro: Detailed Neurologic Exam  Speech:    Speech is normal; fluent and spontaneous with normal comprehension.  Cognition:    The patient is oriented to person, place, and time;     recent  and remote memory intact;     language fluent;     normal attention, concentration,     fund of knowledge Cranial Nerves:    The pupils are equal, round, and reactive to light.  The fundi are normal and spontaneous venous pulsations are present. Visual fields are full to finger confrontation. Extraocular movements are intact. Trigeminal sensation is intact and the muscles of mastication are normal. The face is symmetric. The palate elevates in the midline. Hearing intact. Voice is normal. Shoulder shrug is normal. The tongue has normal motion without fasciculations.   Coordination:    Normal finger to nose and heel to shin. Normal rapid alternating movements.   Gait:    Heel-toe and tandem gait are normal.   Motor Observation:    No asymmetry, no atrophy, and no involuntary movements noted. Tone:    Normal muscle tone.    Posture:    Posture is normal. normal erect    Strength:    Strength is V/V in the upper and lower limbs.      Sensation: intact to LT     Reflex Exam:  DTR's:    Deep tendon reflexes in the upper and lower extremities are normal bilaterally.   Toes:    The toes are downgoing bilaterally.   Clonus:    Clonus is absent.    Assessment/Plan:  Patient with migraines, has failed multiple acute medications. Had lengthy discussion will try Ajovy and migranal  Acute medications Tried: meclizine, relpax, imitrex, ibuprofen, tylenol, cambia, zofran, has tried these in combination, fioricet Depakote may affect bone density, discuss with pcp  Recommend MRI brain, she would like to wait  Discussed :To prevent or relieve headaches, try the following: Cool Compress. Lie down and place a cool compress on your head.  Avoid headache triggers. If certain foods or odors seem to have triggered your migraines in the past, avoid them. A headache diary might help you identify triggers.  Include physical activity in your daily routine. Try a daily walk or other moderate aerobic exercise.  Manage stress. Find healthy ways to cope with the stressors, such as delegating tasks on your to-do list.  Practice relaxation techniques. Try deep breathing, yoga,  massage and visualization.  Eat regularly. Eating regularly scheduled meals and maintaining a healthy diet might help prevent headaches. Also, drink plenty of fluids.  Follow a regular sleep schedule. Sleep deprivation might contribute to headaches Consider biofeedback. With this mind-body technique, you learn to control certain bodily functions - such as muscle tension, heart rate and blood pressure - to prevent headaches or reduce headache pain.    Proceed to emergency room if you experience new or worsening symptoms or symptoms do not resolve, if you have new neurologic symptoms or if headache is severe, or for any concerning symptom.   Provided education and documentation from American headache Society toolbox including articles on: chronic migraine medication overuse headache, chronic migraines, prevention of migraines, behavioral and other nonpharmacologic treatments for headache.    Sarina Ill, MD  [facility] [address] [place], [place] [address]  Phone [phone] Fax [phone]  A total of 25 minutes was spent face-to-face with this patient. Over half this time was spent on counseling patient on the migrainediagnosis and different diagnostic and therapeutic options available.

## 2017-07-11 NOTE — Addendum Note (Signed)
Addended by: Gildardo Griffes on: 07/11/2017 09:03 AM   Modules accepted: Orders

## 2017-07-12 ENCOUNTER — Telehealth: Payer: Self-pay

## 2017-07-12 NOTE — Telephone Encounter (Signed)
[facility] stated with the pts current insurance her PA will have to be done by Rx benefit. THey cannot do or generate a form or do PA on the phone.  Rn was given the number of [phone].  Rn call  [phone] to generate PA. There was instructions on how to download the form from Rx benefit.  Rn went to rx beneft, and there were several links but did not know how to download form.There was no option to speak with a live person. A vm could not be left. Rn call [facility] back at [phone] to state the issue. [facility] customer service stated the ajovy is no on the pts preferred drug list. The two alternatives drugs are Aimovig,and Emqality injections for headache.

## 2017-07-12 NOTE — Telephone Encounter (Signed)
Rn spoke with Dr. Jaynee Eagles and stated pt was given one sample syringe of ajovy. She will change pt to aimovig next month. Rn will notified patient of this change.

## 2017-07-12 NOTE — Telephone Encounter (Signed)
Rn receive frax from patients pharmacy that pts insurance is requiring a PA on ajovy 225mg /1.66ml syringe for headaches. Rn receive a fax to do PA on cover my meds. RN was unsuccessful and could not complete PA on cover my meds. It did not generate any questions to complete the PA. Rn requested help from cover my meds help desk. They stated I would have to call [facility] to do PA on a form.  Rn call [facility] at [phone] to generate PA. Rn stated the PA could not be done on cover my meds because it would not generate a form for questionnaire to do form. They stated pt has a different insurance ID. Her insurance ID is [id number].

## 2017-07-13 ENCOUNTER — Telehealth: Payer: Self-pay | Admitting: Neurology

## 2017-07-13 NOTE — Telephone Encounter (Signed)
Yolanda Carroll called stating they will be faxing over information that needs to be addressed before the weekend, in regards to dihydroergotamine (MIGRANAL) 4 MG/ML nasal spray and nefazodone (SERZONE) 150 MG tablet

## 2017-07-13 NOTE — Telephone Encounter (Signed)
Left detail message on pts cell number as instructed on her dpr. Rn left message that her insurance does not cover ajovy. Rn stated there will be a change in her medication to a different injection. RN left on vm that office close at 1200pm. IF she does not call we will contact her on Monday to discuss this.

## 2017-07-16 NOTE — Telephone Encounter (Signed)
thanks

## 2017-07-16 NOTE — Telephone Encounter (Signed)
Rn call Yolanda Carroll back about her insurance not paying for Ajovy. They will only pay for Aimovig. Rn stated per Dr.Ahern to take the injection,and see how it works. If it helps her headaches she can change her to [place]. PT stated she will take this weekend to make sure she does not have reaction at work. Pt will call us of how the Iraan works.Pt was given one syringe sample of Ajovy. Pt will be change to Aimovig ongoing if she would like try. Pt aware,and verbalized understanding.

## 2017-07-16 NOTE — Telephone Encounter (Signed)
Pt will call us if she want to continue the injectio which is Aimovig covered by her insurance.

## 2017-07-18 ENCOUNTER — Encounter: Payer: Self-pay | Admitting: Neurology

## 2017-07-18 NOTE — Telephone Encounter (Signed)
Rn call cover my meds to state the PA on aimovig will not be done. It was deleted on line. Md states pt can be prescribed the preferred injections which is aimovig, or emqality. PA deleted. THe rep from cover my meds verbalized understanding.

## 2017-07-18 NOTE — Telephone Encounter (Signed)
Greg/[employer] [phone] called he noticed there was an error with trying to get ajovy PA done and was wanting to know the status and if he could be of assistance. I told him the patients insurance would not cover ajovy and the pt was switched to [place]. Advised him I would send a msg to RN that he called and if she had any questions she would call. He was very Patent attorney.

## 2017-07-27 ENCOUNTER — Ambulatory Visit
Admission: RE | Admit: 2017-07-27 | Discharge: 2017-07-27 | Disposition: A | Discharge status: Home / Self Care | Payer: 59 | Source: Ambulatory Visit | Attending: Gynecology | Admitting: Gynecology

## 2017-07-27 DIAGNOSIS — Z1231 Encounter for screening mammogram for malignant neoplasm of breast: Secondary | ICD-10-CM | POA: Diagnosis not present

## 2017-08-02 DIAGNOSIS — J069 Acute upper respiratory infection, unspecified: Secondary | ICD-10-CM | POA: Diagnosis not present

## 2017-08-30 ENCOUNTER — Encounter: Payer: Self-pay | Admitting: Gynecology

## 2017-08-30 ENCOUNTER — Ambulatory Visit (INDEPENDENT_AMBULATORY_CARE_PROVIDER_SITE_OTHER): Payer: 59 | Admitting: Gynecology

## 2017-08-30 VITALS — BP 118/76 | Ht 67.0 in | Wt 136.0 lb

## 2017-08-30 DIAGNOSIS — N952 Postmenopausal atrophic vaginitis: Secondary | ICD-10-CM | POA: Diagnosis not present

## 2017-08-30 DIAGNOSIS — M810 Age-related osteoporosis without current pathological fracture: Secondary | ICD-10-CM | POA: Diagnosis not present

## 2017-08-30 DIAGNOSIS — M79671 Pain in right foot: Secondary | ICD-10-CM | POA: Diagnosis not present

## 2017-08-30 DIAGNOSIS — Z01411 Encounter for gynecological examination (general) (routine) with abnormal findings: Secondary | ICD-10-CM

## 2017-08-30 NOTE — Addendum Note (Signed)
Addended by: Nelva Nay on: 08/30/2017 04:45 PM   Modules accepted: Orders

## 2017-08-30 NOTE — Progress Notes (Signed)
    Yolanda Carroll 1956-01-05 [id number]        62 y.o.  G0P0 for annual gynecologic exam.  Several issues noted below.  Past medical history,surgical history, problem list, medications, allergies, family history and social history were all reviewed and documented as reviewed in the EPIC chart.  ROS:  Performed with pertinent positives and negatives included in the history, assessment and plan.   Additional significant findings : None   Exam: [name] assistant Vitals:   08/30/17 1607  BP: 118/76  Weight: 136 lb (61.7 kg)  Height: 5\' 7"  (1.702 m)   Body mass index is 21.3 kg/m.  General appearance:  Normal affect, orientation and appearance. Skin: Grossly normal HEENT: Without gross lesions.  No cervical or supraclavicular adenopathy. Thyroid normal.  Lungs:  Clear without wheezing, rales or rhonchi Cardiac: RR, without RMG Abdominal:  Soft, nontender, without masses, guarding, rebound, organomegaly or hernia Breasts:  Examined lying and sitting without masses, retractions, discharge or axillary adenopathy. Pelvic:  Ext, BUS, Vagina: With atrophic changes  Cervix: With atrophic changes.  Pap smear done  Uterus: Anteverted, normal size, shape and contour, midline and mobile nontender   Adnexa: Without masses or tenderness    Anus and perineum: Normal   Rectovaginal: Normal sphincter tone without palpated masses or tenderness.    Assessment/Plan:  62 y.o. G0P0 female for annual gynecologic exam.  1. Postmenopausal/atrophic genital changes.  Using one half of estradiol 0.5 mg tablet daily and Provera 2.5 mg daily.  Also vaginal estradiol cream twice weekly.  Notes some relief of her vaginal dryness with this although not totally.  Has been increasing it to 3 times weekly.  She is interested in trying to discontinue her HRT.  We discussed that at 0.25 mg of the estradiol certainly we can try to wean from that to take it every other day for several weeks and then discontinue  both the Provera and the estradiol and see how she does.  She is being followed for osteoporosis as discussed in #2 and we reviewed that she may be having some benefit from the estradiol as far as bone health and this would be sacrificed by discontinuing it.  The risks to include thrombosis in the breast cancer issue weighed against benefits as far as symptom relief cardiovascular and bone health.  At this point the patient is going to wean and see how she does.  She will call me if she has an issue with this or wants to restart the HRT. 2. Osteoporosis.  DEXA 2018 T score -2.6.  Had been on Forteo times 2 years as well as bisphosphate's.  Saw Dr. Cruzita Lederer in consultation as evidenced by her 05/22/2017 note.  They had discussed about starting Prolia and the patient has ultimately decided not to do this at this time.  She is maximizing exercise calcium vitamin D and will redo her bone density next year at a 2-year interval. 3. Pap smear 2016.  Pap smear done today at 3-year interval per current screening guidelines.  No history of abnormal Pap smears previously. 4. Mammography 07/2017.  Continue with annual mammography next year.  Breast exam normal today. 5. Colonoscopy 2016.  Repeat at their recommended interval. 6. Health maintenance.  No routine lab work done as patient does this elsewhere.  Follow-up 1 year, sooner as needed.     [name] MD, 4:35 PM 08/30/2017

## 2017-08-30 NOTE — Patient Instructions (Signed)
Wean off of the hormone pills as we discussed.  Continue on the vaginal estrogen cream 3 times weekly.   Follow-up if any issues with the above.  Follow-up in 1 year for annual exam.

## 2017-08-31 ENCOUNTER — Encounter: Payer: Self-pay | Admitting: *Deleted

## 2017-08-31 LAB — PAP IG W/ RFLX HPV ASCU

## 2017-09-03 ENCOUNTER — Telehealth: Payer: Self-pay | Admitting: *Deleted

## 2017-09-03 MED ORDER — NONFORMULARY OR COMPOUNDED ITEM: 4 refills | Status: DC

## 2017-09-03 NOTE — Telephone Encounter (Signed)
-----   Message from [name], MD sent at 08/30/2017  4:43 PM EDT ----- Call in refill for vaginal estradiol cream prefilled syringes to [facility].  3 times weekly 54-month supply refill times 1 year

## 2017-09-03 NOTE — Telephone Encounter (Signed)
Rx called in 

## 2017-09-09 DIAGNOSIS — R05 Cough: Secondary | ICD-10-CM | POA: Diagnosis not present

## 2017-09-10 ENCOUNTER — Ambulatory Visit: Payer: 59 | Admitting: Neurology

## 2017-09-12 DIAGNOSIS — J329 Chronic sinusitis, unspecified: Secondary | ICD-10-CM | POA: Diagnosis not present

## 2017-10-15 ENCOUNTER — Ambulatory Visit: Payer: 59 | Admitting: Gynecology

## 2017-10-15 ENCOUNTER — Encounter: Payer: Self-pay | Admitting: Gynecology

## 2017-10-15 VITALS — BP 118/76

## 2017-10-15 DIAGNOSIS — N898 Other specified noninflammatory disorders of vagina: Secondary | ICD-10-CM | POA: Diagnosis not present

## 2017-10-15 LAB — WET PREP FOR TRICH, YEAST, CLUE

## 2017-10-15 NOTE — Patient Instructions (Addendum)
Call if the vaginal discharge and itching continues and I will treat you for a bacterial infection.  If the symptoms resolve then we will monitor assuming that it was a treated yeast infection.

## 2017-10-15 NOTE — Progress Notes (Signed)
    Yolanda Carroll May 27, 1955 [id number]        62 y.o.  G0P0 presents having the onset of vaginal itching and discharge last week.  Was away on vacation.  Had Terazol cream with her and used it for 7 nights.  Notes that her symptoms seem a little better but still having some itching.  No urinary symptoms such as frequency dysuria urgency low back pain fever or chills.  No vaginal odor.  Stopped using her systemic estrogen about a month ago.  Continues on her estradiol vaginal cream 3 times weekly.  Past medical history,surgical history, problem list, medications, allergies, family history and social history were all reviewed and documented in the EPIC chart.  Directed ROS with pertinent positives and negatives documented in the history of present illness/assessment and plan.  Exam: [name] assistant Vitals:   10/15/17 1416  BP: 118/76   General appearance:  Normal Abdomen soft nontender without masses guarding rebound Pelvic external BUS vagina with atrophic changes.  Cervix with atrophic changes.  Uterus normal size midline mobile nontender.  Adnexa without masses or tenderness.  Assessment/Plan:  62 y.o. G0P0 with history of discharge and itching medicated with Terazol.  Symptoms seem to be improving but not totally gone.  Wet prep is negative.  Discussed possibilities to include low-grade bacterial versus resolving yeast.  I recommended that she monitor for the next day or 2 and as long as her symptoms continue to improve and resolve then follow expectantly.  If they persist then she will call and we will go ahead and cover her with Flagyl 500 mg twice daily x7 days.    [name] MD, 3:12 PM 10/15/2017

## 2017-10-18 ENCOUNTER — Telehealth: Payer: Self-pay

## 2017-10-18 MED ORDER — METRONIDAZOLE 500 MG PO TABS: 500.0000 mg | ORAL_TABLET | Freq: Two times a day (BID) | ORAL | 0 refills | Status: DC

## 2017-10-18 NOTE — Telephone Encounter (Signed)
Patient saw you on 10/15/17 and she said you told her to call is symptoms persisted and you would prescribe Rx. She reports symptoms are the same.

## 2017-10-18 NOTE — Telephone Encounter (Signed)
Flagyl 500 mg twice daily x7 days.  Alcohol avoidance.

## 2017-10-18 NOTE — Telephone Encounter (Signed)
Rx sent. Patient advised. 

## 2017-10-22 ENCOUNTER — Telehealth: Payer: Self-pay | Admitting: *Deleted

## 2017-10-22 MED ORDER — CLINDAMYCIN PHOSPHATE 2 % VA CREA: 1.0000 | TOPICAL_CREAM | Freq: Every day | VAGINAL | 0 refills | Status: DC

## 2017-10-22 NOTE — Telephone Encounter (Signed)
Rx sent, pt aware 

## 2017-10-22 NOTE — Telephone Encounter (Signed)
Cleocin vaginal cream nightly x7 nights

## 2017-10-22 NOTE — Telephone Encounter (Signed)
Patient called was prescribed Flagyl 500 mg on 10/18/17, states she had to stop medication due to increased heart rate and dizziness. She still has symptoms itching and discharge ( no color) no odor. she asked if another Rx should be sent?

## 2017-11-05 ENCOUNTER — Encounter: Payer: Self-pay | Admitting: Gynecology

## 2017-11-05 ENCOUNTER — Ambulatory Visit: Payer: 59 | Admitting: Gynecology

## 2017-11-05 VITALS — BP 118/76

## 2017-11-05 DIAGNOSIS — N898 Other specified noninflammatory disorders of vagina: Secondary | ICD-10-CM | POA: Diagnosis not present

## 2017-11-05 LAB — WET PREP FOR TRICH, YEAST, CLUE

## 2017-11-05 MED ORDER — TERCONAZOLE 0.4 % VA CREA: 1.0000 | TOPICAL_CREAM | Freq: Every day | VAGINAL | 0 refills | Status: DC

## 2017-11-05 MED ORDER — ESTROGENS, CONJUGATED 0.625 MG/GM VA CREA: 1.0000 | TOPICAL_CREAM | VAGINAL | 6 refills | Status: DC

## 2017-11-05 NOTE — Patient Instructions (Signed)
Use the Terazol cream nightly x7 nights.  Start the Premarin vaginal cream.  Follow-up if your symptoms persist, worsen or recur.

## 2017-11-05 NOTE — Progress Notes (Signed)
    Yolanda Carroll [birth year] [id number]        62 y.o.  G0P0 presents having treated herself with Terazol vaginal cream end of May for vaginal irritation and itching.  Felt a little better but still with persistent itching.  Was evaluated with a negative wet prep.  She was initially monitored feeling that she had a treated yeast infection that was getting better and she was treated but her symptoms seem to be getting worse initially with Flagyl which was switched to Cleocin vaginal cream due to side effects to the Flagyl.  Notes feeling a little better during treatment but once this was done she had return of her vaginal burning.  Had been on HRT which was discontinued but does use vaginal estradiol cream 3 times weekly.  Past medical history,surgical history, problem list, medications, allergies, family history and social history were all reviewed and documented in the EPIC chart.  Directed ROS with pertinent positives and negatives documented in the history of present illness/assessment and plan.  Exam: Yolanda Carroll assistant Vitals:   11/05/17 1541  BP: 118/76   General appearance:  Normal Abdomen soft nontender without masses guarding rebound Pelvic external BUS vagina with scant white discharge.  Atrophic changes noted.  Cervix with atrophic changes.  Uterus normal size midline mobile nontender.  Adnexa without masses or tenderness.  Assessment/Plan:  62 y.o. G0P0 with history and exam as above.  Wet prep was negative.  We discussed whether she has a yeast that initially got better with the Terazol but not totally gone previously and now has recurred.  We will go ahead and cover her with Terazol 7 day cream.  We also discussed her atrophic vaginal symptoms.  She had been on Vagifem and did not feel like it worked.  We switched her to the formulated estradiol cream and she still does not think it works.  Options to include Osphena, trial of a different vaginal cream as well as vaginal laser  discussed.  We ultimately decided on trial of Premarin vaginal cream to see if a different carrier helps.  She will follow-up if her symptoms persist, worsen or recur.    Yolanda Auerbach MD, 3:56 PM 11/05/2017

## 2017-11-06 ENCOUNTER — Ambulatory Visit: Payer: 59 | Admitting: Women's Health

## 2017-12-04 DIAGNOSIS — J040 Acute laryngitis: Secondary | ICD-10-CM | POA: Diagnosis not present

## 2017-12-11 ENCOUNTER — Telehealth: Payer: Self-pay | Admitting: *Deleted

## 2017-12-11 NOTE — Telephone Encounter (Signed)
Patient was prescribed Premarin vaginal cream on 11/05/17 and as expected medication is $100 per tube, she asked if Yolanda Carroll would be an option for her? Please advise

## 2017-12-11 NOTE — Telephone Encounter (Signed)
From an expense standpoint she could use the prefilled estradiol syringes from [facility] which is cheaper to use twice weekly and Premarin.  If she wants to use Intrarosa she certainly can try this.  I have very limited experience with this as not used it in many patients.  It is a nightly intravaginal suppository.  If she is interested in this then that is okay to prescribe that also.  Comes is a 28-day supply I believe

## 2017-12-13 MED ORDER — PRASTERONE 6.5 MG VA INST: 6.5000 mg | VAGINAL_INSERT | Freq: Every day | VAGINAL | 9 refills | Status: DC

## 2017-12-13 NOTE — Telephone Encounter (Signed)
I read her Dr. Dorette Grate note. She said she is already using the compounded estradiol cream and it is burning.  She wants to try the Intrarosa vag supp and per Dr. Dorette Grate note it was sent to her pharmacy.

## 2018-01-08 ENCOUNTER — Ambulatory Visit: Payer: 59 | Admitting: Neurology

## 2018-01-29 ENCOUNTER — Telehealth: Payer: Self-pay | Admitting: Neurology

## 2018-01-29 ENCOUNTER — Encounter: Payer: Self-pay | Admitting: Neurology

## 2018-01-29 ENCOUNTER — Ambulatory Visit: Payer: 59 | Admitting: Neurology

## 2018-01-29 VITALS — BP 94/57 | HR 57 | Ht 68.0 in | Wt 138.0 lb

## 2018-01-29 DIAGNOSIS — G43001 Migraine without aura, not intractable, with status migrainosus: Secondary | ICD-10-CM | POA: Diagnosis not present

## 2018-01-29 DIAGNOSIS — G43709 Chronic migraine without aura, not intractable, without status migrainosus: Secondary | ICD-10-CM | POA: Insufficient documentation

## 2018-01-29 MED ORDER — SUMATRIPTAN SUCCINATE 3 MG/0.5ML ~~LOC~~ SOAJ: 3.0000 mg | Freq: Once | SUBCUTANEOUS | 11 refills | Status: DC | PRN

## 2018-01-29 MED ORDER — ZOLMITRIPTAN 5 MG NA SOLN: NASAL | 0 refills | Status: DC

## 2018-01-29 MED ORDER — ERENUMAB-AOOE 140 MG/ML ~~LOC~~ SOAJ: 140.0000 mg | SUBCUTANEOUS | 0 refills | Status: DC

## 2018-01-29 NOTE — Telephone Encounter (Signed)
Please disregard previous phone notes pertaining to Botox.

## 2018-01-29 NOTE — Telephone Encounter (Signed)
12 wk botox inj °

## 2018-01-29 NOTE — Patient Instructions (Addendum)
Erenumab: Drug information [facility] here. Copyright [phone] [employer] All rights reserved. (For additional information see "Erenumab: Patient drug information")  For abbreviations and symbols that may be used in [employer] (show table) Brand Names: [place]  Aimovig;  Aimovig (140 MG Dose)  Brand Names: [place]  Aimovig  Pharmacologic Category  Calcitonin Gene-Related Peptide (CGRP) Receptor Antagonist;  Monoclonal Antibody, CGRP Antagonist  Dosing: Adult Migraine prophylaxis: SubQ: Initial: 70 mg once a month; some patients may benefit from 140 mg once a month Missed dose: Administer missed dose as soon as possible, and schedule next dose for 1 month from date of the last dose. Dosing: Renal Impairment: Adult There are no dosage adjustments provided in the manufacturer's labeling (has not been studied); renal impairment is not expected to change the pharmacokinetics of erenumab. Dosing: Hepatic Impairment: Adult There are no dosage adjustments provided in the manufacturer's labeling (has not been studied); hepatic impairment is not expected to change the pharmacokinetics of erenumab. Dosing: Geriatric Refer to adult dosing. Dosage Forms: [place] Excipient information presented when available (limited, particularly for generics); consult specific product labeling. Solution Auto-injector, Subcutaneous [preservative free]:  Aimovig: erenumab-aooe 70 mg/mL (1 mL); erenumab-aooe 140 mg/mL (1 mL) [contains polysorbate 80] Aimovig (140 MG Dose): erenumab-aooe 70 mg/mL (1 mL) [contains polysorbate 80] Generic Equivalent Available: [place] No Dosage Forms: [place] Excipient information presented when available (limited, particularly for generics); consult specific product labeling. Solution Auto-injector, Subcutaneous:  Aimovig: 70 mg/mL (1 mL); 140 mg/mL (1 mL) [contains polysorbate 80] Administration: Adult SubQ: For subcutaneous use only; intended for self-administration. Keep  out of direct sunlight and allow to come to room temperature for 30 minutes before administration. Do not warm using a heat source (eg, hot water, microwave) and do not shake. Administer in abdomen (avoiding 2 inches around the navel), thigh or upper arm, avoiding areas of skin that are tender, bruised, red or hard. Deliver entire contents of single-use autoinjector or prefilled syringe. Use: Labeled Indications Migraine prophylaxis: Preventive treatment of migraine in adults Adverse Reactions 1% to 10%: Gastrointestinal: Constipation (3%) Immunologic: Antibody development (3% to 6%) Local: Injection site reaction (5% to 6%) Neuromuscular & skeletal: Muscle cramps (?2%), muscle spasm (?2%) Frequency not defined: Dermatologic: Injection site pruritus Local: Erythema at injection site, pain at injection site <1%, postmarketing, and/or case reports: Anaphylaxis, angioedema, hypersensitivity reaction Contraindications Serious hypersensitivity to erenumab or any component of the formulation. Warnings/Precautions Concerns related to adverse effects: Marland Kitchen Hypersensitivity: Hypersensitivity reactions, including rash, angioedema, and anaphylaxis, have been reported. Most reactions are mild to moderate and occur within hours after administration, but some may be delayed for >1 week. If a hypersensitivity reaction occurs, discontinue treatment and institute appropriate therapy. Dosage form specific issues: . Latex: The packaging (needle shield of auto-injector and needle cap of prefilled syringe) may contain latex. Metabolism/Transport Effects None known. Drug Interactions   (For additional information: Launch drug interactions program)   There are no known significant interactions. Pregnancy Implications Adverse events were not observed in animal reproduction studies. Breast-Feeding Considerations It is not known if erenumab is present in breast milk. According to the manufacturer, the decision to  breastfeed during therapy should consider the risk of infant exposure, the benefits of breastfeeding to the infant, and benefits of treatment to the mother. Monitoring Parameters Number of monthly migraine days Mechanism of Action Erenumab is a human monoclonal antibody that antagonizes calcitonin gene-related peptide (CGRP) receptor function. Pharmacodynamics and Pharmacokinetics Distribution: Vz: 3.86 L Metabolism: Via a nonspecific, nonsaturable proteolytic pathway Bioavailability:  82% Half-life elimination: 28 days Time to peak: ~6 days Pricing: [place] Solution Auto-injector (Aimovig (140 MG Dose) Subcutaneous) 70 mg/mL (per mL): $345.00 Solution Auto-injector (Aimovig Subcutaneous) 70 mg/mL (per mL): $690.00 140 mg/mL (per mL): $690.00 Disclaimer: A representative AWP (Average Wholesale Price) price or price range is provided as reference price only. A range is provided when more than one manufacturer's AWP price is available and uses the low and high price reported by the manufacturers to determine the range. The pricing data should be used for benchmarking purposes only, and as such should not be used alone to set or adjudicate any prices for reimbursement or purchasing functions or considered to be an exact price for a single product and/or manufacturer. [employer] expressly disclaims all warranties of any kind or nature, whether express or implied, and assumes no liability with respect to accuracy of price or price range data published in its solutions. In no event shall [employer] be liable for special, indirect, incidental, or consequential damages arising from use of price or price range data. Pricing data is updated monthly. Brand Names: International  Aimovig (AT, AU, CZ, EE, GB, HR, HU, LT, LV, NO, PT, RO, SK)   For country abbreviations used in [place] (show table) Use of [employer]ToDate is subject to the Subscription and License Agreement.     Zolmitriptan Nasal Spray  What is this  medicine? ZOLMITRIPTAN (zohl mi TRIP tan) is used to treat migraines with or without aura. An aura is a strange feeling or visual disturbance that warns you of an attack. It is not used to prevent migraines. This medicine may be used for other purposes; ask your health care provider or pharmacist if you have questions. COMMON BRAND NAME(S): Zomig What should I tell my health care provider before I take this medicine? They need to know if you have any of these conditions: -bowel disease, colitis or bloody diarrhea -diabetes -family history of heart disease -fast or irregular heartbeat -headaches that are different from your usual migraine -heart or blood vessel disease, angina (chest pain), or previous heart attack -high blood pressure -high cholesterol -history of stroke, transient ischemic attacks (TIAs or mini-strokes), or intracranial bleeding -kidney or liver disease -overweight -postmenopausal or surgical removal of uterus and ovaries -seizure disorder -tobacco smoker -an unusual or allergic reaction to zolmitriptan, other medicines, foods, dyes, or preservatives -pregnant or trying to get pregnant -breast-feeding How should I use this medicine? This medicine is for use in the nose. Follow the directions on the prescription label. This medicine is taken at the first symptoms of a migraine. It is not for everyday use. Do not take your medicine more often than directed. Talk to your pediatrician regarding the use of this medicine in children. While this drug may be prescribed for children as young as 12 years for selected conditions, precautions do apply. Overdosage: If you think you have taken too much of this medicine contact a poison control center or emergency room at once. NOTE: This medicine is only for you. Do not share this medicine with others. What if I miss a dose? This does not apply; this medicine is not for regular use. What may interact with this medicine? Do not take  this medicine with any of the following medicines: -amphetamine or cocaine -dihydroergotamine, ergotamine, ergoloid mesylates, methysergide, or ergot-type medication - do not take within 24 hours of taking zolmitriptan -feverfew -MAOIs like Carbex, Eldepryl, Marplan, Nardil, and Parnate - do not take zolmitriptan within 2 weeks of stopping MAOI  therapy -other migraine medicines like almotriptan, eletriptan, naratriptan, rizatriptan, sumatriptan - do not take within 24 hours of taking zolmitriptan -tryptophan This medicine may also interact with the following medications: -cimetidine -birth control pills -medicines for mental depression, anxiety or mood problems -propranolol This list may not describe all possible interactions. Give your health care provider a list of all the medicines, herbs, non-prescription drugs, or dietary supplements you use. Also tell them if you smoke, drink alcohol, or use illegal drugs. Some items may interact with your medicine. What should I watch for while using this medicine? Only take this medicine for a migraine headache. Take it if you get warning symptoms or at the start of a migraine attack. It is not for regular use to prevent migraine attacks. You may get drowsy or dizzy. Do not drive, use machinery, or do anything that needs mental alertness until you know how this medicine affects you. To reduce dizzy or fainting spells, do not sit or stand up quickly, especially if you are an older patient. Alcohol can increase drowsiness, dizziness and flushing. Avoid alcoholic drinks. Smoking cigarettes may increase the risk of heart-related side effects from using this medicine. If you take migraine medicines for 10 or more days a month, your migraines may get worse. Keep a diary of headache days and medicine use. Contact your healthcare professional if your migraine attacks occur more frequently. What side effects may I notice from receiving this medicine? Side effects  that you should report to your doctor or health care professional as soon as possible: -allergic reactions like skin rash, itching or hives, swelling of the face, lips, or tongue -fast, slow, or irregular heart beat -increased blood pressure -palpitations -severe stomach pain and cramping, bloody diarrhea -signs and symptoms of a blood clot such as breathing problems; changes in vision; chest pain; severe, sudden headache; pain, swelling, warmth in the leg; trouble speaking; sudden numbness or weakness of the face, arm or leg -tingling, pain, or numbness in the face, hands, or feet Side effects that usually do not require medical attention (report to your doctor or health care professional if they continue or are bothersome): -dry mouth -feeling warm, flushing, or redness of the face -headache -muscle cramps, pain -nausea, vomiting -unusually tired or weak This list may not describe all possible side effects. Call your doctor for medical advice about side effects. You may report side effects to FDA at 1-800-FDA-1088. Where should I keep my medicine? Keep out of the reach of children. Store at room temperature between 20 and 25 degrees C (68 and 77 degrees F). Throw away any unused medicine after the expiration date. NOTE: This sheet is a summary. It may not cover all possible information. If you have questions about this medicine, talk to your doctor, pharmacist, or health care provider.  2018 Elsevier/Gold Standard (2013-11-13 15:36:46)   Sumatriptan nasal spray What is this medicine? SUMATRIPTAN (soo ma TRIP tan) is used to treat migraines with or without aura. An aura is a strange feeling or visual disturbance that warns you of an attack. It is not used to prevent migraines. This medicine may be used for other purposes; ask your health care provider or pharmacist if you have questions. COMMON BRAND NAME(S): Imitrex What should I tell my health care provider before I take this  medicine? They need to know if you have any of these conditions: -circulation problems in fingers and toes -diabetes -heart disease -high blood pressure -high cholesterol -history of irregular heartbeat -history of  stroke -kidney disease -liver disease -postmenopausal or surgical removal of uterus and ovaries -seizures -smoke tobacco -stomach or intestine problems -an unusual or allergic reaction to sumatriptan, other medicines, foods, dyes, or preservatives -pregnant or trying to get pregnant -breast-feeding How should I use this medicine? This medicine is for use in the nose. Follow the directions on the prescription label. This medicine is taken at the first symptoms of a migraine. It is not for everyday use. Do not take your medicine more often than directed. Talk to your pediatrician regarding the use of this medicine in children. Special care may be needed. Overdosage: If you think you have taken too much of this medicine contact a poison control center or emergency room at once. NOTE: This medicine is only for you. Do not share this medicine with others. What if I miss a dose? This does not apply; this medicine is not for regular use. What may interact with this medicine? Do not take this medicine with any of the following medicines: -cocaine -ergot alkaloids like dihydroergotamine, ergonovine, ergotamine, methylergonovine -feverfew -MAOIs like Carbex, Eldepryl, Marplan, Nardil, and Parnate -other medicines for migraine headache like almotriptan, eletriptan, frovatriptan, naratriptan, rizatriptan, zolmitriptan -tryptophan This medicine may also interact with the following medications: -certain medicines for depression, anxiety, or psychotic disturbances This list may not describe all possible interactions. Give your health care provider a list of all the medicines, herbs, non-prescription drugs, or dietary supplements you use. Also tell them if you smoke, drink alcohol, or  use illegal drugs. Some items may interact with your medicine. What should I watch for while using this medicine? Only take this medicine for a migraine headache. Take it if you get warning symptoms or at the start of a migraine attack. It is not for regular use to prevent migraine attacks. You may get drowsy or dizzy. Do not drive, use machinery, or do anything that needs mental alertness until you know how this medicine affects you. To reduce dizzy or fainting spells, do not sit or stand up quickly, especially if you are an older patient. Alcohol can increase drowsiness, dizziness and flushing. Avoid alcoholic drinks. Smoking cigarettes may increase the risk of heart-related side effects from using this medicine. If you take migraine medicines for 10 or more days a month, your migraines may get worse. Keep a diary of headache days and medicine use. Contact your healthcare professional if your migraine attacks occur more frequently. What side effects may I notice from receiving this medicine? Side effects that you should report to your doctor or health care professional as soon as possible: -allergic reactions like skin rash, itching or hives, swelling of the face, lips, or tongue -bloody or watery diarrhea -hallucination, loss of contact with reality -pain, tingling, numbness in the face, hands, or feet -seizures -signs and symptoms of a blood clot such as breathing problems; changes in vision; chest pain; severe, sudden headache; pain, swelling, warmth in the leg; trouble speaking; sudden numbness or weakness of the face, arm, or leg -signs and symptoms of a dangerous change in heartbeat or heart rhythm like chest pain; dizziness; fast or irregular heartbeat; palpitations, feeling faint or lightheaded; falls; breathing problems -signs and symptoms of a stroke like changes in vision; confusion; trouble speaking or understanding; severe headaches; sudden numbness or weakness of the face, arm, or leg;  trouble walking; dizziness; loss of balance or coordination -stomach pain Side effects that usually do not require medical attention (report to your doctor or health care professional if  they continue or are bothersome): -changes in taste -facial flushing -headache -muscle cramps -muscle pain -nausea, vomiting -weak or tired This list may not describe all possible side effects. Call your doctor for medical advice about side effects. You may report side effects to FDA at 1-800-FDA-1088. Where should I keep my medicine? Keep out of the reach of children. Store at room temperature between 2 and 30 degrees C (36 and 86 degrees F). Throw away any unused medicine after the expiration date. NOTE: This sheet is a summary. It may not cover all possible information. If you have questions about this medicine, talk to your doctor, pharmacist, or health care provider.  2018 Elsevier/Gold Standard (2015-06-03 12:43:05)

## 2018-01-29 NOTE — Telephone Encounter (Signed)
12 wk Botox inj °

## 2018-01-29 NOTE — Progress Notes (Signed)
[facility]    Provider:  Dr Jaynee Eagles Referring Provider: Jonathon Jordan, MD Primary Care Physician:  Jonathon Jordan, MD  CC:  Migraine   Tried her on a combination of meds for acute management (cambia, zofran, relpax) was also taking imitrex and baclofen. Tramadol in hte past did not help. Tried Fioricet at last appointment, made her dizzy and made her itch. Discussed trying other medications. Discussed oral medications. She has them worse in the winter, they can last up to 7 days. Discussed trying Zembrace and Zomig nasal acutely and also starting Aimovig for the next 3 months as she has worsening migraines in winter will provide samples and see how she does.  Interval history 03/12/2017: Tried a combination of medications, cambia, relpax and zofran. Cambia alone did not help. Zofran and relpax did not help when she got home. Took all three together later in the mirgaine didn;t help. But also tried it at onset of headache and did not stop the migraine. She had her last migraine at the end of August until the 17th of September. Then October 24th had another headache and the tylenol worked. Lasted a few days, baclofen for neck pain but did not stop it until the 26th-28th. 63 days has had 41 headache free days. In 2 months had 22 migraine days. Season may also trigger. She has tried imitrex PO. Tramadol shot in the past had not helped, migraine started on 6/27 and imitrex did not help but she had the headache for several days so this was not at onset. Tried cambia.   HPI:  Yolanda Carroll is a 62 y.o. female here as a referral from Dr. Stephanie Acre for migraines. She is currently on Depakote, meloxicam, Imitrex, baclofen. She has a past medical history of osteoporosis, plantar fasciitis, migraine, irritable bowel syndrome, fatigue, neck and back pain with degenerative disc disease and radiculopathy, arthritis. She has had migraines since 2000. Started worsening in October. They can  last up to 2 weeks straight. She has associated vertigo. She has done well on nefazodone and depakote. The next headache was in June and lasted 2 weeks. It hurts behind the eyes, she can still function but is moderate in pain, weather triggers, she has light sensitivity, no significant nausea or vomiting. She has had nausea and vomiting in the past. Depakote is working for her. Slowly progressive when they start. No medication overuse. No other focal neurologic deficits, associated symptoms, inciting events or modifiable factors.  Meds tried: Depakote, Imitrex, baclofen, Nortriptyline, Gabapentin, Nefazodone, Skelaxin, ketaprofen,   Reviewed notes, labs and imaging from outside physicians, which showe:   Personally reviewed imaging and agree with following  MRI cervical spine 07/2013: 1. Mild worsening of the foraminal impingement at C7-T1 due to progressive spondylosis and degenerative disc disease. 2. Mild left foraminal impingement at C3-4, C5-6, and C6-7 primarily due to spurring. Although there is some residual left foraminal impingement at the postoperative levels, the degree of impingement at these levels is much less than on the preoperative exam.  Review of Systems: Patient complains of symptoms per HPI as well as the following symptoms: ringing in ears, anxiety. Pertinent negatives and positives per HPI. All others negative.  Review of Systems: Patient complains of symptoms per HPI as well as the following symptoms:headache. Pertinent negatives and positives per HPI. All others negative.   Social History   Socioeconomic History  . Marital status: Married    Spouse name: Fritz Pickerel  . Number of children: 0  . Years  of education: 33  . Highest education level: Not on file  Occupational History    Comment: [employer]  Social Needs  . Financial resource strain: Not on file  . Food insecurity:    Worry: Not on file    Inability: Not on file  .  Transportation needs:    Medical: Not on file    Non-medical: Not on file  Tobacco Use  . Smoking status: Former Smoker    Last attempt to quit: 05/17/1983    Years since quitting: 34.7  . Smokeless tobacco: Never Used  Substance and Sexual Activity  . Alcohol use: Yes    Alcohol/week: 1.0 - 2.0 standard drinks    Types: 1 - 2 Standard drinks or equivalent per week    Comment: socially, occasional  . Drug use: No  . Sexual activity: Yes    Birth control/protection: Post-menopausal    Comment: intercourse age 41 , sexual partners more than 5  Lifestyle  . Physical activity:    Days per week: Not on file    Minutes per session: Not on file  . Stress: Not on file  Relationships  . Social connections:    Talks on phone: Not on file    Gets together: Not on file    Attends religious service: Not on file    Active member of club or organization: Not on file    Attends meetings of clubs or organizations: Not on file    Relationship status: Not on file  . Intimate partner violence:    Fear of current or ex partner: Not on file    Emotionally abused: Not on file    Physically abused: Not on file    Forced sexual activity: Not on file  Other Topics Concern  . Not on file  Social History Narrative   Pt is married, no children.  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    Caffeine- very little.   Right handed   Lives at home with her husband    Family History  Problem Relation Age of Onset  . Cancer Father        lymphoma  . Other Mother        bipolar,reflux  . Bipolar disorder Mother   . Other Brother        sinus problems  . Cancer Maternal Aunt        uterine cancer  . Breast cancer Maternal Aunt        40's  . Diabetes Maternal Aunt   . Cancer Paternal Aunt        Colon cancer  . Breast cancer Cousin 49       Mat. 1st cousin    Past Medical History:  Diagnosis Date  . Allergy   . Arthritis   . Cervical dysplasia   . Endometriosis   . Fatigue    . GERD (gastroesophageal reflux disease)   . IBS (irritable bowel syndrome)   . Migraine   . Osteoporosis 01/2017   T score -2.6  . Plantar fasciitis   . PONV (postoperative nausea and vomiting)    also difficult to wake up  . Recurrent vaginitis   . Reflux   . Scoliosis   . Wears glasses     Past Surgical History:  Procedure Laterality Date  . ANTERIOR CERVICAL DECOMP/DISCECTOMY FUSION  04/17/2011   Procedure: ANTERIOR CERVICAL DECOMPRESSION/DISCECTOMY FUSION 2 LEVELS;  Surgeon: Hosie Spangle;  Location: [facility];  Service: Neurosurgery;  Laterality: N/A;  Cervical five-six, six-seven anterior cervical decompression with fusion,  plating,  and bonegraft   . APPENDECTOMY  1978  . BREAST BIOPSY  07/13/2011   Procedure: BREAST BIOPSY WITH NEEDLE LOCALIZATION;  Surgeon: Rolm Bookbinder, MD;  Location: [place];  Service: General;  Laterality: Right;  Right breast wire localization biopsy  . BREAST EXCISIONAL BIOPSY Right 2013  . BREAST SURGERY     Breast Bx-Benign  . CYSTOSCOPY    . GYNECOLOGIC CRYOSURGERY    . HERNIA REPAIR  08/02/1995   RIH  . LAPAROSCOPIC ENDOMETRIOSIS FULGURATION  1997  . PELVIC LAPAROSCOPY    . ROTATOR CUFF REPAIR     right 2002 left 2000    Current Outpatient Medications  Medication Sig Dispense Refill  . ALPRAZolam (XANAX) 0.25 MG tablet Take 0.25 mg by mouth daily as needed.     . Ascorbic Acid (VITAMIN C PO) Take 1 tablet by mouth daily. 500mg  capsule    . baclofen (LIORESAL) 10 MG tablet 10 mg as needed.    Marland Kitchen BEPREVE 1.5 % SOLN     . Calcium Carbonate (CALCIUM 600 PO) Take 1 tablet by mouth 2 (two) times daily.     . Cholecalciferol (VITAMIN D PO) Take 800 Units by mouth daily.     Marland Kitchen conjugated estrogens (PREMARIN) vaginal cream Place 1 Applicatorful vaginally 2 (two) times a week. 42.5 g 6  . divalproex (DEPAKOTE) 500 MG 24 hr tablet Take 500 mg by mouth at bedtime.     . Gabapentin (NEURONTIN PO) Take 100 mg by mouth  daily.     Marland Kitchen L-LYSINE PO Take 1 tablet by mouth daily.     Marland Kitchen lidocaine (LIDODERM) 5 % Place onto the skin as needed. Back pain    . magnesium 30 MG tablet Take 30 mg by mouth every other day.     . meclizine (ANTIVERT) 25 MG tablet Take 25 mg by mouth 3 (three) times daily as needed for dizziness.    . meloxicam (MOBIC) 7.5 MG tablet Take 7.5 mg by mouth daily.     . Multiple Vitamin (MULTIVITAMIN) tablet Take 1 tablet by mouth daily.    . nefazodone (SERZONE) 150 MG tablet Take 150 mg by mouth daily.    . pantoprazole (PROTONIX) 20 MG tablet Take 20 mg by mouth 2 (two) times daily.    . vitamin E 400 UNIT capsule Take 200 Units by mouth daily.     Eduard Roux (AIMOVIG) 140 MG/ML SOAJ Inject 140 mg into the skin every 30 (thirty) days. 3 pen 0  . SUMAtriptan Succinate (ZEMBRACE SYMTOUCH) 3 MG/0.5ML SOAJ Inject 3 mg into the skin once as needed for up to 1 dose. May repeat in 15 minutes. If symptoms persist, repeat in 2 hours. Max 4 injections daily. 8 pen 11  . valACYclovir (VALTREX) 500 MG tablet Take 500 mg by mouth 2 (two) times daily as needed.    . zolmitriptan (ZOMIG) 5 MG nasal solution Take at onset of headache. May repeat in 2 hours. Max twice daily. Do not take with any other triptan. 6 Units 0   No current facility-administered medications for this visit.     Allergies as of 01/29/2018 - Review Complete 01/29/2018  Allergen Reaction Noted  . Penicillins Other (See Comments) 11/30/2010  . Atarax [hydroxyzine] Other (See Comments) 01/08/2017  . Celexa [citalopram hydrobromide] Other (See Comments) 02/21/2012  . Erythromycin  01/05/2017  . Flagyl [metronidazole]  10/22/2017  .  Nortriptyline Itching 04/17/2011  . Prilosec [omeprazole]  01/05/2017  . Requip [ropinirole]  01/05/2017  . Tramadol Itching 04/05/2011  . Darvocet [propoxyphene n-acetaminophen] Itching 11/30/2010  . Percocet [oxycodone-acetaminophen] Itching 11/30/2010    Vitals: BP (!) 94/57 (BP Location: Right  Arm, Patient Position: Sitting)   Pulse (!) 57   Ht 5\' 8"  (1.727 m)   Wt 138 lb (62.6 kg)   BMI 20.98 kg/m  Last Weight:  Wt Readings from Last 1 Encounters:  01/29/18 138 lb (62.6 kg)   Last Height:   Ht Readings from Last 1 Encounters:  01/29/18 5\' 8"  (1.727 m)   Physical exam: Exam: Gen: NAD, conversant, well nourised, obese, well groomed                     CV: RRR, no MRG. No Carotid Bruits. No peripheral edema, warm, nontender Eyes: Conjunctivae clear without exudates or hemorrhage  Neuro: Detailed Neurologic Exam  Speech:    Speech is normal; fluent and spontaneous with normal comprehension.  Cognition:    The patient is oriented to person, place, and time;     recent and remote memory intact;     language fluent;     normal attention, concentration,     fund of knowledge Cranial Nerves:    The pupils are equal, round, and reactive to light. The fundi are normal and spontaneous venous pulsations are present. Visual fields are full to finger confrontation. Extraocular movements are intact. Trigeminal sensation is intact and the muscles of mastication are normal. The face is symmetric. The palate elevates in the midline. Hearing intact. Voice is normal. Shoulder shrug is normal. The tongue has normal motion without fasciculations.   Coordination:    Normal finger to nose and heel to shin. Normal rapid alternating movements.   Gait:    Heel-toe and tandem gait are normal.   Motor Observation:    No asymmetry, no atrophy, and no involuntary movements noted. Tone:    Normal muscle tone.    Posture:    Posture is normal. normal erect    Strength:    Strength is V/V in the upper and lower limbs.      Sensation: intact to LT     Reflex Exam:  DTR's:    Deep tendon reflexes in the upper and lower extremities are normal bilaterally.   Toes:    The toes are downgoing bilaterally.   Clonus:    Clonus is absent.    Assessment/Plan:  Patient with migraines,  has failed multiple acute medications. Had lengthy discussion will try Ajovy and migranal  Acute medications Tried: meclizine, relpax, imitrex, ibuprofen, tylenol, cambia, zofran, has tried these in combination, fioricet(side effects) Depakote may affect bone density, discuss with pcp  Gave Aimovig, zomig and zembrace samples  Recommend MRI brain, she would like to wait  Meds ordered this encounter  Medications  . Erenumab-aooe (AIMOVIG) 140 MG/ML SOAJ    Sig: Inject 140 mg into the skin every 30 (thirty) days.    Dispense:  3 pen    Refill:  0    [id number] 9/20  . SUMAtriptan Succinate (ZEMBRACE SYMTOUCH) 3 MG/0.5ML SOAJ    Sig: Inject 3 mg into the skin once as needed for up to 1 dose. May repeat in 15 minutes. If symptoms persist, repeat in 2 hours. Max 4 injections daily.    Dispense:  8 pen    Refill:  11    [id number] 3/21  . zolmitriptan (ZOMIG)  5 MG nasal solution    Sig: Take at onset of headache. May repeat in 2 hours. Max twice daily. Do not take with any other triptan.    Dispense:  6 Units    Refill:  0    [id number] 07-2019     Discussed :To prevent or relieve headaches, try the following: Cool Compress. Lie down and place a cool compress on your head.  Avoid headache triggers. If certain foods or odors seem to have triggered your migraines in the past, avoid them. A headache diary might help you identify triggers.  Include physical activity in your daily routine. Try a daily walk or other moderate aerobic exercise.  Manage stress. Find healthy ways to cope with the stressors, such as delegating tasks on your to-do list.  Practice relaxation techniques. Try deep breathing, yoga, massage and visualization.  Eat regularly. Eating regularly scheduled meals and maintaining a healthy diet might help prevent headaches. Also, drink plenty of fluids.  Follow a regular sleep schedule. Sleep deprivation might contribute to headaches Consider biofeedback. With this mind-body  technique, you learn to control certain bodily functions - such as muscle tension, heart rate and blood pressure - to prevent headaches or reduce headache pain.    Proceed to emergency room if you experience new or worsening symptoms or symptoms do not resolve, if you have new neurologic symptoms or if headache is severe, or for any concerning symptom.   Provided education and documentation from American headache Society toolbox including articles on: chronic migraine medication overuse headache, chronic migraines, prevention of migraines, behavioral and other nonpharmacologic treatments for headache.    Sarina Ill, MD  [facility] [address] [place], [place] [address]  Phone [phone] Fax [phone]  A total of 25 minutes was spent face-to-face with this patient. Over half this time was spent on counseling patient on the  1. Migraine without aura and with status migrainosus, not intractable    and different diagnostic and therapeutic options available.

## 2018-01-30 NOTE — Telephone Encounter (Signed)
Error

## 2018-01-30 NOTE — Telephone Encounter (Signed)
Noted, disregarded.

## 2018-02-06 DIAGNOSIS — Z23 Encounter for immunization: Secondary | ICD-10-CM | POA: Diagnosis not present

## 2018-02-11 DIAGNOSIS — Z5181 Encounter for therapeutic drug level monitoring: Secondary | ICD-10-CM | POA: Diagnosis not present

## 2018-02-11 DIAGNOSIS — Z79899 Other long term (current) drug therapy: Secondary | ICD-10-CM | POA: Diagnosis not present

## 2018-02-11 DIAGNOSIS — Z13220 Encounter for screening for lipoid disorders: Secondary | ICD-10-CM | POA: Diagnosis not present

## 2018-02-12 DIAGNOSIS — Z0000 Encounter for general adult medical examination without abnormal findings: Secondary | ICD-10-CM | POA: Diagnosis not present

## 2018-02-15 ENCOUNTER — Encounter: Payer: Self-pay | Admitting: Internal Medicine

## 2018-02-15 NOTE — Progress Notes (Signed)
Received labs from PCP checked on 02/11/2018: CBC without anemia, slightly low WBC at 3.0 (4.0-11.0), and slightly low RBC at 4.07 (4.2-5.4) CMP normal, with glucose 76, BUN/creatinine 12/0.86 TSH normal at 2.79 Vitamin D level normal at 61.9 Lipids: 187/101/56/111

## 2018-02-24 ENCOUNTER — Other Ambulatory Visit: Payer: Self-pay | Admitting: Psychiatry

## 2018-02-26 ENCOUNTER — Ambulatory Visit (INDEPENDENT_AMBULATORY_CARE_PROVIDER_SITE_OTHER): Payer: 59 | Admitting: Psychiatry

## 2018-02-26 DIAGNOSIS — F411 Generalized anxiety disorder: Secondary | ICD-10-CM

## 2018-02-26 NOTE — Progress Notes (Signed)
      [facility] Counselor/Therapist Progress Note   Patient ID: Yolanda Carroll, MRN: [id number]  Date: 02/26/2018  Timespent: 45 minutes  Treatment Type: Individual  Subjective: Patient in today feeling very stressed (work, family, church, etx), apprehensive, overwhelmed, anxious, guilt.  Still needing to set healthier limits on her time and energy.  Has begun to set some limits and separate herself from certain things at work.  Difficult to make changes--"I feel so responsible for everybody".  Giving self permission to step back and take care of herself better was discussed at length, and shared strategies for stopping that long-time trend.  Patient to focus more on elevating self-care: setting limits, exercise, eating healthy, taking breaks, using mindfulness exercises, not feeling like everything is on her shoulders.  Will take notes on progress made and follow up next visit on progress.  Interventions:Solution Focused, Strength-based and Supportive  Mental Status Exam:   Appearance:   Well Groomed     Behavior:  Appropriate and Sharing  Motor:  Normal  Speech/Language:   Normal Rate  Affect:  Congruent  Mood:  anxious  Thought process:  Coherent  Thought content:    Logical  Perceptual disturbances:    Normal  Orientation:  Full (Time, Place, and Person)  Attention:  Good  Concentration:  good  Memory:  Immediate  Fund of knowledge:   Good  Insight:    Good  Judgment:   Good  Impulse Control:  good    Reported Symptoms: Stressed (work and family), anxiety, tension, overwhelmed, guilt  Risk Assessment: Danger to Self:  No Self-injurious Behavior: No Danger to Others: No Duty to Warn:no Physical Aggression / Violence:No  Access to Firearms a concern: No  Gang Involvement:No   Diagnosis:   ICD-10-CM   1. Generalized anxiety disorder F41.1      Plan: Plan to see patient again in 2-3 weeks to continue goal-directed treatment.  Shanon Ace,  LCSW

## 2018-03-07 ENCOUNTER — Other Ambulatory Visit: Payer: Self-pay | Admitting: Neurology

## 2018-03-07 MED ORDER — ERENUMAB-AOOE 140 MG/ML ~~LOC~~ SOAJ: 140.0000 mg | SUBCUTANEOUS | 11 refills | Status: DC

## 2018-03-08 DIAGNOSIS — R0683 Snoring: Secondary | ICD-10-CM | POA: Diagnosis not present

## 2018-03-08 DIAGNOSIS — H9190 Unspecified hearing loss, unspecified ear: Secondary | ICD-10-CM | POA: Diagnosis not present

## 2018-03-08 DIAGNOSIS — H903 Sensorineural hearing loss, bilateral: Secondary | ICD-10-CM | POA: Diagnosis not present

## 2018-03-08 DIAGNOSIS — H9313 Tinnitus, bilateral: Secondary | ICD-10-CM | POA: Diagnosis not present

## 2018-03-12 ENCOUNTER — Encounter: Payer: Self-pay | Admitting: *Deleted

## 2018-03-12 ENCOUNTER — Telehealth: Payer: Self-pay | Admitting: *Deleted

## 2018-03-12 DIAGNOSIS — M545 Low back pain: Secondary | ICD-10-CM | POA: Diagnosis not present

## 2018-03-12 DIAGNOSIS — M5126 Other intervertebral disc displacement, lumbar region: Secondary | ICD-10-CM | POA: Diagnosis not present

## 2018-03-12 DIAGNOSIS — M419 Scoliosis, unspecified: Secondary | ICD-10-CM | POA: Diagnosis not present

## 2018-03-12 DIAGNOSIS — M4727 Other spondylosis with radiculopathy, lumbosacral region: Secondary | ICD-10-CM | POA: Diagnosis not present

## 2018-03-12 NOTE — Telephone Encounter (Signed)
Completed PA on Cover My Meds under pt's [employer] prescription coverage. KEY: [id number].   Meds tried: Depakote, Imitrex, baclofen, Nortriptyline, Gabapentin, Nefazodone, Skelaxin, ketaprofen.  [id number];UKRCVK:[id number];Review Type:Prior Auth;Coverage Start Date:02/10/2018;Coverage End Date:03/12/2019;

## 2018-03-14 ENCOUNTER — Ambulatory Visit (INDEPENDENT_AMBULATORY_CARE_PROVIDER_SITE_OTHER): Payer: 59 | Admitting: Psychiatry

## 2018-03-14 DIAGNOSIS — F411 Generalized anxiety disorder: Secondary | ICD-10-CM

## 2018-03-14 NOTE — Progress Notes (Signed)
      [facility] Counselor/Therapist Progress Note   Patient ID: Yolanda Carroll, MRN: [id number]  Date: 03/14/2018  Timespent: 48 minutes   Treatment Type: Individual   Reported Symptoms: anxiety, some depression, overwhelmed   Mental Status Exam:    Appearance:   Well Groomed     Behavior:  Appropriate and Sharing  Motor:  Normal  Speech/Language:   Normal Rate  Affect:  Congruent  Mood:  anxious  Thought process:  normal  Thought content:    WNL  Sensory/Perceptual disturbances:    WNL  Orientation:  oriented to person, place, time/date, situation, day of week, month of year and year  Attention:  Good  Concentration:  Good  Memory:  WNL  Fund of knowledge:   Good  Insight:    Good  Judgment:   Good  Impulse Control:  Good     Risk Assessment: Danger to Self:  No Self-injurious Behavior: No Danger to Others: No Duty to Warn:no Physical Aggression / Violence:No  Access to Firearms a concern: No  Gang Involvement:No    Subjective:   Patient in today with symptoms of anxiety, some depression, and feeling overwhelmed.  Hard to set limits and cut back in areas that she needs to cut back or delegate tasks.  Has done some letting go but then other tasks pop up and seem to get on her plate. "I'm trying to not micro-manage as much.  Did make some meeting changes that will help moving forward. Overwhelmed but seeing that with a few changes being made, she does feel some of her expectations and stress will lessen in time.  Reflected on changes she has made to set limits: leaving work on-time, set up meeting with others critical to her job to meet weekly, not doing work emails at home at night, asking for some help at times, AND is starting to get back in fitness center and exercise 3-5 times weekly.  Goal review with progress noted.   Interventions: Solution-Oriented/Positive Psychology, Ego-Supportive and Insight-Oriented   Diagnosis:   ICD-10-CM   1. Generalized  anxiety disorder F41.1      Plan: Will see patinet in 2-3 weeks to continue goal-directed treatment.   Shanon Ace, LCSW

## 2018-03-26 ENCOUNTER — Ambulatory Visit: Payer: 59 | Admitting: Psychiatry

## 2018-03-27 ENCOUNTER — Other Ambulatory Visit: Payer: Self-pay | Admitting: Neurology

## 2018-03-27 MED ORDER — FREMANEZUMAB-VFRM 225 MG/1.5ML ~~LOC~~ SOSY: 225.0000 mg | PREFILLED_SYRINGE | SUBCUTANEOUS | 11 refills | Status: DC

## 2018-03-28 ENCOUNTER — Other Ambulatory Visit: Payer: Self-pay | Admitting: *Deleted

## 2018-03-28 ENCOUNTER — Telehealth: Payer: Self-pay

## 2018-03-28 ENCOUNTER — Encounter: Payer: Self-pay | Admitting: *Deleted

## 2018-03-28 NOTE — Progress Notes (Signed)
Patient is switching to Ajovy from the [place] because of side effect. Aimovig canceled in chart and RN also spoke with [facility] @ [facility] and canceled the [place]. [facility] is aware that pt is switching to Ajovy and will be bringing a copay card.

## 2018-03-28 NOTE — Telephone Encounter (Signed)
PA for Ajovy was approved instantly. Key: [id number] PA Case Id: [id number] Rx number: [id number] Arie Sabina has been approved from 02-26-18 through 03-28-2019.  Tried medications: Depakote, Imitrex, baclofen, Nortriptyline, Gabapentin, Nefazodone, Skelaxin, ketaprofen. [facility] [address] [place], [place] [address] Tel: [phone] Fax: [phone]

## 2018-04-03 ENCOUNTER — Encounter: Payer: Self-pay | Admitting: Emergency Medicine

## 2018-04-03 DIAGNOSIS — F419 Anxiety disorder, unspecified: Secondary | ICD-10-CM | POA: Insufficient documentation

## 2018-04-18 ENCOUNTER — Ambulatory Visit (INDEPENDENT_AMBULATORY_CARE_PROVIDER_SITE_OTHER): Payer: 59 | Admitting: Psychiatry

## 2018-04-18 DIAGNOSIS — F411 Generalized anxiety disorder: Secondary | ICD-10-CM

## 2018-04-19 NOTE — Progress Notes (Signed)
      [facility] Counselor/Therapist Progress Note  Patient ID: Yolanda Carroll, MRN: [id number],    Date: 04/19/2018  Time Spent: 53  minutes  Treatment Type: Individual Therapy  Reported Symptoms:  Patient's symptoms are no longer present except for some intermittent mild anxiety which she is managing well.    Mental Status Exam:  Appearance:   Neat     Behavior:  Appropriate and Sharing  Motor:  Normal  Speech/Language:   Normal Rate  Affect:  Appropriate  Mood:  normal  Thought process:  normal  Thought content:    WNL  Sensory/Perceptual disturbances:    WNL  Orientation:  oriented to person, place, time/date, situation, day of week, month of year and year  Attention:  Good  Concentration:  Good  Memory:  WNL  Fund of knowledge:   Good  Insight:    Good  Judgment:   Good  Impulse Control:  Good   Risk Assessment: Danger to Self:  No Self-injurious Behavior: No Danger to Others: No Duty to Warn:no Physical Aggression / Violence:No  Access to Firearms a concern: No  Gang Involvement:No   Subjective:  Patient in today and has continued to improve using strategies discussed in prior sessions to help manage symptoms and get her to feeling less anxious a more confident to handle stressors as they arise.  Goals reviewed, which patient has met.  Great attitude.  She does not need further treatment at this time.  Interventions: Cognitive Behavioral Therapy, Solution-Oriented/Positive Psychology and Ego-Supportive  Diagnosis:   ICD-10-CM   1. Generalized anxiety disorder F41.1     Plan:  As noted above, patient has met therapy goals and does not need to return at this time for treatment.   Shanon Ace, LCSW

## 2018-04-30 DIAGNOSIS — J0100 Acute maxillary sinusitis, unspecified: Secondary | ICD-10-CM | POA: Diagnosis not present

## 2018-05-13 ENCOUNTER — Other Ambulatory Visit: Payer: Self-pay | Admitting: Psychiatry

## 2018-05-14 NOTE — Telephone Encounter (Signed)
Last fill 03/16/18  Please escribe  Not seen in epic yet

## 2018-05-22 ENCOUNTER — Ambulatory Visit (INDEPENDENT_AMBULATORY_CARE_PROVIDER_SITE_OTHER): Payer: 59 | Admitting: Psychiatry

## 2018-05-22 ENCOUNTER — Ambulatory Visit: Payer: 59 | Admitting: Internal Medicine

## 2018-05-22 ENCOUNTER — Encounter: Payer: Self-pay | Admitting: Psychiatry

## 2018-05-22 DIAGNOSIS — F5105 Insomnia due to other mental disorder: Secondary | ICD-10-CM

## 2018-05-22 DIAGNOSIS — G43009 Migraine without aura, not intractable, without status migrainosus: Secondary | ICD-10-CM | POA: Diagnosis not present

## 2018-05-22 DIAGNOSIS — F411 Generalized anxiety disorder: Secondary | ICD-10-CM

## 2018-05-22 NOTE — Progress Notes (Signed)
Yolanda Carroll [id number] Apr 08, 1956 63 y.o.  Subjective:   Patient ID:  Yolanda Carroll is a 63 y.o. (DOB November 08, 1955) female.  Chief Complaint:  Chief Complaint  Patient presents with  . Follow-up    Medication management    HPI Yolanda Carroll presents to the office today for follow-up of anxiety and migraine.  Good Xmas seeing brother.  Good family time.  Overall doing well now.  Had anxiety awhile and had EAP sessions to work work/life balance.  Plans to retire in a year.  Counseling was helpful.  Can't retire DT insurance.  Getting tired of the hours but has cut back.  That's helped.  Sleep not great lately with leg cramps.  Started more exercise.  Started snoring and H is a light sleeper.  Meds help.  Patient reports stable mood and denies depressed or irritable moods.  Patient denies any recent difficulty with anxiety.  Patient denies difficulty with sleep initiation.. Denies appetite disturbance.  Patient reports that energy and motivation have been good.  Patient denies any difficulty with concentration.  Patient denies any suicidal ideation.  H running for office.    Saw neurologist for Migraine and had problems with the meds RX.  Will try the Ajovy soon.  HA last a week when they occur.  Depakote has lost some benefit but they are not severe.  Review of Systems:  Review of Systems  Neurological: Positive for headaches. Negative for tremors and weakness.  Psychiatric/Behavioral: Negative for agitation, behavioral problems, confusion, decreased concentration, dysphoric mood, hallucinations, self-injury, sleep disturbance and suicidal ideas. The patient is not nervous/anxious and is not hyperactive.     Medications: I have reviewed the patient's current medications.  Current Outpatient Medications  Medication Sig Dispense Refill  . ALPRAZolam (XANAX) 0.25 MG tablet TAKE ONE TABLET BY MOUTH TWICE DAILY 60 tablet 2  . Ascorbic Acid (VITAMIN C PO) Take 1 tablet by  mouth daily. 500mg  capsule    . baclofen (LIORESAL) 10 MG tablet 10 mg as needed.    Marland Kitchen BEPREVE 1.5 % SOLN     . Calcium Carbonate (CALCIUM 600 PO) Take 1 tablet by mouth 2 (two) times daily.     . Cholecalciferol (VITAMIN D PO) Take 800 Units by mouth daily.     Marland Kitchen conjugated estrogens (PREMARIN) vaginal cream Place 1 Applicatorful vaginally 2 (two) times a week. 42.5 g 6  . DEPAKOTE ER 250 MG 24 hr tablet TAKE  THREE TABLETS BY MOUTH AT BEDTIME (Patient taking differently: 500 mg at bedtime. ) 90 tablet 1  . Fremanezumab-vfrm (AJOVY) 225 MG/1.5ML SOSY Inject 225 mg into the skin every 30 (thirty) days. 1 Syringe 11  . Gabapentin (NEURONTIN PO) Take 100 mg by mouth daily.     Marland Kitchen L-LYSINE PO Take 1 tablet by mouth daily.     Marland Kitchen lidocaine (LIDODERM) 5 % Place onto the skin as needed. Back pain    . magnesium 30 MG tablet Take 30 mg by mouth every other day.     . meclizine (ANTIVERT) 25 MG tablet Take 25 mg by mouth 3 (three) times daily as needed for dizziness.    . meloxicam (MOBIC) 7.5 MG tablet Take 7.5 mg by mouth daily.     . Multiple Vitamin (MULTIVITAMIN) tablet Take 1 tablet by mouth daily.    . nefazodone (SERZONE) 150 MG tablet Take 150 mg by mouth daily.    . pantoprazole (PROTONIX) 20 MG tablet Take 20 mg by mouth 2 (  two) times daily.    . SUMAtriptan Succinate (ZEMBRACE SYMTOUCH) 3 MG/0.5ML SOAJ Inject 3 mg into the skin once as needed for up to 1 dose. May repeat in 15 minutes. If symptoms persist, repeat in 2 hours. Max 4 injections daily. 8 pen 11  . valACYclovir (VALTREX) 500 MG tablet Take 500 mg by mouth 2 (two) times daily as needed.    . vitamin E 400 UNIT capsule Take 200 Units by mouth daily.     Marland Kitchen zolmitriptan (ZOMIG) 5 MG nasal solution Take at onset of headache. May repeat in 2 hours. Max twice daily. Do not take with any other triptan. 6 Units 0   No current facility-administered medications for this visit.     Medication Side Effects: None  Allergies:  Allergies   Allergen Reactions  . Penicillins Other (See Comments)    Seizure as a child  . Atarax [Hydroxyzine] Other (See Comments)    Headache, depression  . Celexa [Citalopram Hydrobromide] Other (See Comments)    Chest pain  . Erythromycin      Upset stomache  . Flagyl [Metronidazole]     Dizzy and increased heart rate  . Nortriptyline Itching  . Prilosec [Omeprazole]     Abdominal pain  . Requip [Ropinirole]     Made sx worse  . Tramadol Itching  . Darvocet [Propoxyphene N-Acetaminophen] Itching  . Percocet [Oxycodone-Acetaminophen] Itching    Past Medical History:  Diagnosis Date  . Allergy   . Arthritis   . Cervical dysplasia   . Endometriosis   . Fatigue   . GERD (gastroesophageal reflux disease)   . IBS (irritable bowel syndrome)   . Migraine   . Osteoporosis 01/2017   T score -2.6  . Plantar fasciitis   . PONV (postoperative nausea and vomiting)    also difficult to wake up  . Recurrent vaginitis   . Reflux   . Scoliosis   . Wears glasses     Family History  Problem Relation Age of Onset  . Cancer Father        lymphoma  . Other Mother        bipolar,reflux  . Bipolar disorder Mother   . Other Brother        sinus problems  . Cancer Maternal Aunt        uterine cancer  . Breast cancer Maternal Aunt        40's  . Diabetes Maternal Aunt   . Cancer Paternal Aunt        Colon cancer  . Breast cancer Cousin 86       Mat. 1st cousin    Social History   Socioeconomic History  . Marital status: Married    Spouse name: Fritz Pickerel  . Number of children: 0  . Years of education: 31  . Highest education level: Not on file  Occupational History    Comment: [employer]  Social Needs  . Financial resource strain: Not on file  . Food insecurity:    Worry: Not on file    Inability: Not on file  . Transportation needs:    Medical: Not on file    Non-medical: Not on file  Tobacco Use  . Smoking status: Former Smoker    Last attempt to  quit: 05/17/1983    Years since quitting: 35.0  . Smokeless tobacco: Never Used  Substance and Sexual Activity  . Alcohol use: Yes    Alcohol/week: 1.0 - 2.0 standard drinks  Types: 1 - 2 Standard drinks or equivalent per week    Comment: socially, occasional  . Drug use: No  . Sexual activity: Yes    Birth control/protection: Post-menopausal    Comment: intercourse age 40 , sexual partners more than 5  Lifestyle  . Physical activity:    Days per week: Not on file    Minutes per session: Not on file  . Stress: Not on file  Relationships  . Social connections:    Talks on phone: Not on file    Gets together: Not on file    Attends religious service: Not on file    Active member of club or organization: Not on file    Attends meetings of clubs or organizations: Not on file    Relationship status: Not on file  . Intimate partner violence:    Fear of current or ex partner: Not on file    Emotionally abused: Not on file    Physically abused: Not on file    Forced sexual activity: Not on file  Other Topics Concern  . Not on file  Social History Narrative   Pt is married, no children.  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    Caffeine- very little.   Right handed   Lives at home with her husband    Past Medical History, Surgical history, Social history, and Family history were reviewed and updated as appropriate.   Please see review of systems for further details on the patient's review from today.   Objective:   Physical Exam:  There were no vitals taken for this visit.  Physical Exam Constitutional:      General: She is not in acute distress.    Appearance: She is well-developed.  Musculoskeletal:        General: No deformity.  Neurological:     Mental Status: She is alert and oriented to person, place, and time.     Motor: No tremor.     Coordination: Coordination normal.     Gait: Gait normal.  Psychiatric:        Attention and Perception:  Attention and perception normal.        Mood and Affect: Mood is not anxious or depressed. Affect is not labile, blunt, angry or inappropriate.        Speech: Speech normal.        Behavior: Behavior normal.        Thought Content: Thought content normal. Thought content does not include homicidal or suicidal ideation. Thought content does not include homicidal or suicidal plan.        Cognition and Memory: Cognition normal.        Judgment: Judgment normal.     Comments: Insight intact. No auditory or visual hallucinations. No delusions.      Lab Review:     Component Value Date/Time   NA 139 02/21/2012 1746   K 3.4 (L) 02/21/2012 1746   CL 100 02/21/2012 1746   CO2 29 02/21/2012 1746   GLUCOSE 85 02/21/2012 1746   BUN 19 02/21/2012 1746   CREATININE 0.79 02/21/2012 1746   CALCIUM 9.5 02/21/2012 1746   PROT 7.6 02/21/2012 1746   ALBUMIN 4.2 02/21/2012 1746   AST 18 02/21/2012 1746   ALT 12 02/21/2012 1746   ALKPHOS 36 (L) 02/21/2012 1746   BILITOT 0.3 02/21/2012 1746   GFRNONAA >90 02/21/2012 1746   GFRAA >90 02/21/2012 1746       Component Value Date/Time  WBC 5.4 02/21/2012 1746   RBC 4.35 02/21/2012 1746   HGB 13.3 02/21/2012 1746   HCT 39.6 02/21/2012 1746   PLT 213 02/21/2012 1746   MCV 91.0 02/21/2012 1746   MCH 30.6 02/21/2012 1746   MCHC 33.6 02/21/2012 1746   RDW 12.8 02/21/2012 1746   LYMPHSABS 2.5 02/21/2012 1746   MONOABS 0.7 02/21/2012 1746   EOSABS 0.1 02/21/2012 1746   BASOSABS 0.0 02/21/2012 1746    Normal liver enzymes in September.  No results found for: POCLITH, LITHIUM   No results found for: PHENYTOIN, PHENOBARB, VALPROATE, CBMZ   .res Assessment: Plan:    Generalized anxiety disorder  Insomnia due to mental condition  Migraine without aura and without status migrainosus, not intractable   Greater than 50% of face to face time with patient was spent on counseling and coordination of care. We discussed the following.  Disc  snoring and dental appliances.  Disc discussed also dealing with the leg cramps at night.  Possible strategies addressed.  We discussed the short-term risks associated with benzodiazepines including sedation and increased fall risk among others.  Discussed long-term side effect risk including dependence, potential withdrawal symptoms, and the potential eventual dose-related risk of dementia.  Continue Depakote and Serzone which help reduce headache frequency.  She still needs name brand Depakote because she has more headaches on the generic version.  Discussed migraine headache management.  If she gets a good response from the Ajovy then we may wean the Depakote.  Answered her questions about Depakote and Serzone and liver toxicity risks.  Her liver enzymes have been very normal and she is at low risk of developing liver problems at this point because she has tolerated it well for an extended period of time.  This was a 30-minute appointment  FU 6 mos  Lynder Parents, MD, DFAPA  Please see After Visit Summary for patient specific instructions.  Future Appointments  Date Time Provider [place]  08/01/2018  3:00 PM Melvenia Beam, MD [facility] None  08/15/2018  3:30 PM Philemon Kingdom, MD [id number] None  09/03/2018  4:00 PM Fontaine, Belinda Block, MD [facility]    No orders of the defined types were placed in this encounter.     -------------------------------

## 2018-06-03 ENCOUNTER — Other Ambulatory Visit: Payer: Self-pay | Admitting: Psychiatry

## 2018-07-05 ENCOUNTER — Other Ambulatory Visit: Payer: Self-pay | Admitting: Gynecology

## 2018-07-05 DIAGNOSIS — Z1231 Encounter for screening mammogram for malignant neoplasm of breast: Secondary | ICD-10-CM

## 2018-07-31 ENCOUNTER — Ambulatory Visit: Payer: 59

## 2018-08-01 ENCOUNTER — Ambulatory Visit: Payer: 59 | Admitting: Neurology

## 2018-08-01 ENCOUNTER — Encounter: Payer: Self-pay | Admitting: Neurology

## 2018-08-01 ENCOUNTER — Other Ambulatory Visit: Payer: Self-pay

## 2018-08-01 VITALS — BP 115/60 | HR 64 | Temp 97.6°F | Ht 67.0 in | Wt 138.0 lb

## 2018-08-01 DIAGNOSIS — G43709 Chronic migraine without aura, not intractable, without status migrainosus: Secondary | ICD-10-CM

## 2018-08-01 MED ORDER — UBROGEPANT 50 MG PO TABS: 50.0000 mg | ORAL_TABLET | ORAL | 0 refills | Status: DC | PRN

## 2018-08-01 MED ORDER — AIMOVIG 140 MG/ML ~~LOC~~ SOAJ: 140.0000 mg | SUBCUTANEOUS | 11 refills | Status: DC

## 2018-08-01 NOTE — Progress Notes (Signed)
[facility]    Provider:  Dr Jaynee Eagles Referring Provider: Jonathon Jordan, MD Primary Care Physician:  Jonathon Jordan, MD  CC:  Migraine    Interval history 08/01/2018: She went back to [place] because it worked better. Ajovy didn't work, it wore off.  The aimovig gave her constipation but she is working with GI and feels better. Discussed other acute medications, [name] and others. Has had a difficult time with acute management. She feels tremendously improved.  In the past we have tried her on a combination of meds for acute management, she is tried multiple of them Cambia, Zofran, Relpax and other triptans, baclofen, tramadol, Fioricet, and brace and Zomig and other triptan's.  Today we discussed some of the new medications such as Ubrogrepant and Lasmitidan.  I gave her samples of [employer] [place] today and she will get back to [place].  Interval History 01/29/2018; Tried her on a combination of meds for acute management (cambia, zofran, relpax) was also taking imitrex and baclofen. Tramadol in hte past did not help. Tried Fioricet at last appointment, made her dizzy and made her itch. Discussed trying other medications. Discussed oral medications. She has them worse in the winter, they can last up to 7 days. Discussed trying Zembrace and Zomig nasal acutely and also starting Aimovig for the next 3 months as she has worsening migraines in winter will provide samples and see how she does.  Interval history 03/12/2017: Tried a combination of medications, cambia, relpax and zofran. Cambia alone did not help. Zofran and relpax did not help when she got home. Took all three together later in the mirgaine didn;t help. But also tried it at onset of headache and did not stop the migraine. She had her last migraine at the end of August until the 17th of September. Then October 24th had another headache and the tylenol worked. Lasted a few days, baclofen for neck pain but did not stop it until the  26th-28th. 63 days has had 41 headache free days. In 2 months had 22 migraine days. Season may also trigger. She has tried imitrex PO. Tramadol shot in the past had not helped, migraine started on 6/27 and imitrex did not help but she had the headache for several days so this was not at onset. Tried cambia.   HPI:  Yolanda Carroll is a 63 y.o. female here as a referral from Dr. Stephanie Acre for migraines. She is currently on Depakote, meloxicam, Imitrex, baclofen. She has a past medical history of osteoporosis, plantar fasciitis, migraine, irritable bowel syndrome, fatigue, neck and back pain with degenerative disc disease and radiculopathy, arthritis. She has had migraines since 2000. Started worsening in October. They can last up to 2 weeks straight. She has associated vertigo. She has done well on nefazodone and depakote. The next headache was in June and lasted 2 weeks. It hurts behind the eyes, she can still function but is moderate in pain, weather triggers, she has light sensitivity, no significant nausea or vomiting. She has had nausea and vomiting in the past. Depakote is working for her. Slowly progressive when they start. No medication overuse. No other focal neurologic deficits, associated symptoms, inciting events or modifiable factors.  Meds tried: Depakote, Imitrex, baclofen, Nortriptyline, Gabapentin, Nefazodone, Skelaxin, ketaprofen,   Reviewed notes, labs and imaging from outside physicians, which showe:   Personally reviewed imaging and agree with following  MRI cervical spine 07/2013: 1. Mild worsening of the foraminal impingement at C7-T1 due to progressive spondylosis and degenerative  disc disease. 2. Mild left foraminal impingement at C3-4, C5-6, and C6-7 primarily due to spurring. Although there is some residual left foraminal impingement at the postoperative levels, the degree of impingement at these levels is much less than on the preoperative exam.  Review of Systems:  Patient complains of symptoms per HPI as well as the following symptoms: ringing in ears, anxiety. Pertinent negatives and positives per HPI. All others negative.  Review of Systems: Patient complains of symptoms per HPI as well as the following symptoms:headache. Pertinent negatives and positives per HPI. All others negative.   Social History   Socioeconomic History  . Marital status: Married    Spouse name: Fritz Pickerel  . Number of children: 0  . Years of education: 29  . Highest education level: Not on file  Occupational History    Comment: [employer]  Social Needs  . Financial resource strain: Not on file  . Food insecurity:    Worry: Not on file    Inability: Not on file  . Transportation needs:    Medical: Not on file    Non-medical: Not on file  Tobacco Use  . Smoking status: Former Smoker    Last attempt to quit: 05/17/1983    Years since quitting: 35.2  . Smokeless tobacco: Never Used  Substance and Sexual Activity  . Alcohol use: Yes    Alcohol/week: 1.0 - 2.0 standard drinks    Types: 1 - 2 Standard drinks or equivalent per week    Comment: socially, occasional  . Drug use: No  . Sexual activity: Yes    Birth control/protection: Post-menopausal    Comment: intercourse age 67 , sexual partners more than 5  Lifestyle  . Physical activity:    Days per week: Not on file    Minutes per session: Not on file  . Stress: Not on file  Relationships  . Social connections:    Talks on phone: Not on file    Gets together: Not on file    Attends religious service: Not on file    Active member of club or organization: Not on file    Attends meetings of clubs or organizations: Not on file    Relationship status: Not on file  . Intimate partner violence:    Fear of current or ex partner: Not on file    Emotionally abused: Not on file    Physically abused: Not on file    Forced sexual activity: Not on file  Other Topics Concern  . Not on file  Social  History Narrative   Pt is married, no children.  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    Caffeine- very little.   Right handed   Lives at home with her husband    Family History  Problem Relation Age of Onset  . Cancer Father        lymphoma  . Other Mother        bipolar,reflux  . Bipolar disorder Mother   . Other Brother        sinus problems  . Cancer Maternal Aunt        uterine cancer  . Breast cancer Maternal Aunt        40's  . Diabetes Maternal Aunt   . Cancer Paternal Aunt        Colon cancer  . Breast cancer Cousin 35       Mat. 1st cousin    Past Medical History:  Diagnosis  Date  . Allergy   . Arthritis   . Cervical dysplasia   . Endometriosis   . Fatigue   . GERD (gastroesophageal reflux disease)   . IBS (irritable bowel syndrome)   . Migraine   . Osteoporosis 01/2017   T score -2.6  . Plantar fasciitis   . PONV (postoperative nausea and vomiting)    also difficult to wake up  . Recurrent vaginitis   . Reflux   . Scoliosis   . Wears glasses     Past Surgical History:  Procedure Laterality Date  . ANTERIOR CERVICAL DECOMP/DISCECTOMY FUSION  04/17/2011   Procedure: ANTERIOR CERVICAL DECOMPRESSION/DISCECTOMY FUSION 2 LEVELS;  Surgeon: Hosie Spangle;  Location: [facility];  Service: Neurosurgery;  Laterality: N/A;  Cervical five-six, six-seven anterior cervical decompression with fusion,  plating,  and bonegraft   . APPENDECTOMY  1978  . BREAST BIOPSY  07/13/2011   Procedure: BREAST BIOPSY WITH NEEDLE LOCALIZATION;  Surgeon: Rolm Bookbinder, MD;  Location: [facility];  Service: General;  Laterality: Right;  Right breast wire localization biopsy  . BREAST EXCISIONAL BIOPSY Right 2013  . BREAST SURGERY     Breast Bx-Benign  . CYSTOSCOPY    . GYNECOLOGIC CRYOSURGERY    . HERNIA REPAIR  08/02/1995   RIH  . LAPAROSCOPIC ENDOMETRIOSIS FULGURATION  1997  . PELVIC LAPAROSCOPY    . ROTATOR CUFF REPAIR     right  2002 left 2000    Current Outpatient Medications  Medication Sig Dispense Refill  . AIMOVIG 140 MG/ML SOAJ Inject 140 mg into the skin every 30 (thirty) days. 1 pen 11  . ALPRAZolam (XANAX) 0.25 MG tablet TAKE ONE TABLET BY MOUTH TWICE DAILY (Patient taking differently: Take 0.25 mg by mouth at bedtime. ) 60 tablet 2  . Ascorbic Acid (VITAMIN C PO) Take 1 tablet by mouth daily. 500mg  capsule    . baclofen (LIORESAL) 10 MG tablet 10 mg as needed.    Marland Kitchen BEPREVE 1.5 % SOLN     . Calcium Carbonate (CALCIUM 600 PO) Take 1 tablet by mouth 2 (two) times daily.     . Cholecalciferol (VITAMIN D PO) Take 800 Units by mouth daily.     Marland Kitchen conjugated estrogens (PREMARIN) vaginal cream Place 1 Applicatorful vaginally 2 (two) times a week. 42.5 g 6  . DEPAKOTE ER 250 MG 24 hr tablet TAKE  THREE TABLETS BY MOUTH AT BEDTIME 90 tablet 5  . Gabapentin (NEURONTIN PO) Take 100 mg by mouth daily.     Marland Kitchen L-LYSINE PO Take 1 tablet by mouth daily.     Marland Kitchen lidocaine (LIDODERM) 5 % Place onto the skin as needed. Back pain    . magnesium 30 MG tablet Take 30 mg by mouth every other day.     . meclizine (ANTIVERT) 25 MG tablet Take 25 mg by mouth 3 (three) times daily as needed for dizziness.    . meloxicam (MOBIC) 7.5 MG tablet Take 7.5 mg by mouth daily.     . Multiple Vitamin (MULTIVITAMIN) tablet Take 1 tablet by mouth daily.    . nefazodone (SERZONE) 150 MG tablet Take 150 mg by mouth daily.    . pantoprazole (PROTONIX) 40 MG tablet Take 40 mg by mouth 2 (two) times daily.    . SUMAtriptan Succinate (ZEMBRACE SYMTOUCH) 3 MG/0.5ML SOAJ Inject 3 mg into the skin once as needed for up to 1 dose. May repeat in 15 minutes. If symptoms persist, repeat in 2  hours. Max 4 injections daily. 8 pen 11  . valACYclovir (VALTREX) 500 MG tablet Take 500 mg by mouth 2 (two) times daily as needed.    . vitamin E 400 UNIT capsule Take 200 Units by mouth daily.     Marland Kitchen zolmitriptan (ZOMIG) 5 MG nasal solution Take at onset of headache. May  repeat in 2 hours. Max twice daily. Do not take with any other triptan. 6 Units 0  . Ubrogepant (UBRELVY) 50 MG TABS Take 50 mg by mouth every 2 (two) hours as needed. Max 200mg  a day. Take right at onset of migraine. 4 tablet 0   No current facility-administered medications for this visit.     Allergies as of 08/01/2018 - Review Complete 08/01/2018  Allergen Reaction Noted  . Penicillins Other (See Comments) 11/30/2010  . Atarax [hydroxyzine] Other (See Comments) 01/08/2017  . Celexa [citalopram hydrobromide] Other (See Comments) 02/21/2012  . Erythromycin  01/05/2017  . Flagyl [metronidazole]  10/22/2017  . Nortriptyline Itching 04/17/2011  . Prilosec [omeprazole]  01/05/2017  . Requip [ropinirole]  01/05/2017  . Tramadol Itching 04/05/2011  . Darvocet [propoxyphene n-acetaminophen] Itching 11/30/2010  . Percocet [oxycodone-acetaminophen] Itching 11/30/2010    Vitals: BP 115/60 (BP Location: Right Arm, Patient Position: Sitting)   Pulse 64   Temp 97.6 F (36.4 C)   Ht 5\' 7"  (1.702 m)   Wt 138 lb (62.6 kg)   BMI 21.61 kg/m  Last Weight:  Wt Readings from Last 1 Encounters:  08/01/18 138 lb (62.6 kg)   Last Height:   Ht Readings from Last 1 Encounters:  08/01/18 5\' 7"  (1.702 m)   Physical exam: Exam: Gen: NAD, conversant, well nourised, obese, well groomed                     CV: RRR, no MRG. No Carotid Bruits. No peripheral edema, warm, nontender Eyes: Conjunctivae clear without exudates or hemorrhage  Neuro: Detailed Neurologic Exam  Speech:    Speech is normal; fluent and spontaneous with normal comprehension.  Cognition:    The patient is oriented to person, place, and time;     recent and remote memory intact;     language fluent;     normal attention, concentration,     fund of knowledge Cranial Nerves:    The pupils are equal, round, and reactive to light. The fundi are normal and spontaneous venous pulsations are present. Visual fields are full to  finger confrontation. Extraocular movements are intact. Trigeminal sensation is intact and the muscles of mastication are normal. The face is symmetric. The palate elevates in the midline. Hearing intact. Voice is normal. Shoulder shrug is normal. The tongue has normal motion without fasciculations.   Coordination:    Normal finger to nose and heel to shin. Normal rapid alternating movements.   Gait:    Heel-toe and tandem gait are normal.   Motor Observation:    No asymmetry, no atrophy, and no involuntary movements noted. Tone:    Normal muscle tone.    Posture:    Posture is normal. normal erect    Strength:    Strength is V/V in the upper and lower limbs.      Sensation: intact to LT     Reflex Exam:  DTR's:    Deep tendon reflexes in the upper and lower extremities are normal bilaterally.   Toes:    The toes are downgoing bilaterally.   Clonus:    Clonus is absent.  Assessment/Plan:  Patient with migraines, has failed multiple acute medications. Had lengthy discussion will try Ajovy and migranal  Acute medications Tried: meclizine, relpax, imitrex, ibuprofen, tylenol, cambia, zofran,Cambia, Zofran, Relpax and other triptans, baclofen, tramadol, Fioricet, and brace and Zomig and other triptan's., has tried these in combination, fioricet(side effects), meloxicam, magnesium, meclizine and many others.  We will try the new medication Ubrelvy for acute management. Gave her patient literature from [employer] on medication.   Depakote may affect bone density, discuss with pcp.   Aimovig gave her constipation but Ajovy did not work and she went back to the [employer].  At this time we will try to get approval for Aimovig since she failed the other CGRP.  May consider going down to 70 mg instead of 140 but at this time she is taking over-the-counter medication and feels her constipation is much improved and likes the Aimovig and will stay with this.  A total of 25 minutes was spent  face-to-face with this patient. Over half this time was spent on counseling patient on the  1. Chronic migraine without aura without status migrainosus, not intractable    diagnosis and different diagnostic and therapeutic options, counseling and coordination of care, risks ans benefits of management, compliance, or risk factor reduction and education.     Recommend MRI brain, she would like to wait  Meds ordered this encounter  Medications  . Ubrogepant (UBRELVY) 50 MG TABS    Sig: Take 50 mg by mouth every 2 (two) hours as needed. Max 200mg  a day. Take right at onset of migraine.    Dispense:  4 tablet    Refill:  0  . AIMOVIG 140 MG/ML SOAJ    Sig: Inject 140 mg into the skin every 30 (thirty) days.    Dispense:  1 pen    Refill:  11    Failed Ajovy     Discussed :To prevent or relieve headaches, try the following: Cool Compress. Lie down and place a cool compress on your head.  Avoid headache triggers. If certain foods or odors seem to have triggered your migraines in the past, avoid them. A headache diary might help you identify triggers.  Include physical activity in your daily routine. Try a daily walk or other moderate aerobic exercise.  Manage stress. Find healthy ways to cope with the stressors, such as delegating tasks on your to-do list.  Practice relaxation techniques. Try deep breathing, yoga, massage and visualization.  Eat regularly. Eating regularly scheduled meals and maintaining a healthy diet might help prevent headaches. Also, drink plenty of fluids.  Follow a regular sleep schedule. Sleep deprivation might contribute to headaches Consider biofeedback. With this mind-body technique, you learn to control certain bodily functions - such as muscle tension, heart rate and blood pressure - to prevent headaches or reduce headache pain.    Proceed to emergency room if you experience new or worsening symptoms or symptoms do not resolve, if you have new neurologic symptoms  or if headache is severe, or for any concerning symptom.   Provided education and documentation from American headache Society toolbox including articles on: chronic migraine medication overuse headache, chronic migraines, prevention of migraines, behavioral and other nonpharmacologic treatments for headache.    Sarina Ill, MD  [facility] [address] [place], [place] [address]  Phone [phone] Fax [phone]  A total of 25 minutes was spent face-to-face with this patient. Over half this time was spent on counseling patient on the  1. Chronic migraine without aura without status migrainosus, not intractable    and different diagnostic and therapeutic options available.

## 2018-08-07 ENCOUNTER — Other Ambulatory Visit: Payer: Self-pay | Admitting: Neurology

## 2018-08-07 MED ORDER — ONDANSETRON 4 MG PO TBDP: 4.0000 mg | ORAL_TABLET | Freq: Three times a day (TID) | ORAL | 3 refills | Status: DC | PRN

## 2018-08-07 MED ORDER — METHYLPREDNISOLONE 4 MG PO TBPK: ORAL_TABLET | ORAL | 1 refills | Status: DC

## 2018-08-12 ENCOUNTER — Encounter: Payer: Self-pay | Admitting: Internal Medicine

## 2018-08-14 ENCOUNTER — Other Ambulatory Visit: Payer: Self-pay | Admitting: Neurology

## 2018-08-14 MED ORDER — CHLORPROMAZINE HCL 100 MG PO TABS: 100.0000 mg | ORAL_TABLET | Freq: Two times a day (BID) | ORAL | 3 refills | Status: DC

## 2018-08-15 ENCOUNTER — Ambulatory Visit: Payer: 59 | Admitting: Internal Medicine

## 2018-08-28 ENCOUNTER — Ambulatory Visit: Payer: Self-pay

## 2018-09-02 ENCOUNTER — Other Ambulatory Visit: Payer: Self-pay

## 2018-09-03 ENCOUNTER — Encounter: Payer: Self-pay | Admitting: Gynecology

## 2018-09-03 ENCOUNTER — Ambulatory Visit (INDEPENDENT_AMBULATORY_CARE_PROVIDER_SITE_OTHER): Payer: 59 | Admitting: Gynecology

## 2018-09-03 VITALS — BP 122/76 | Ht 66.0 in | Wt 136.0 lb

## 2018-09-03 DIAGNOSIS — M810 Age-related osteoporosis without current pathological fracture: Secondary | ICD-10-CM | POA: Diagnosis not present

## 2018-09-03 DIAGNOSIS — Z01419 Encounter for gynecological examination (general) (routine) without abnormal findings: Secondary | ICD-10-CM | POA: Diagnosis not present

## 2018-09-03 DIAGNOSIS — N952 Postmenopausal atrophic vaginitis: Secondary | ICD-10-CM

## 2018-09-03 NOTE — Progress Notes (Signed)
    Yolanda Carroll 05-03-1956 [id number]        63 y.o.  G0P0 for annual gynecologic exam.  Continues to have issues with dyspareunia.  Past medical history,surgical history, problem list, medications, allergies, family history and social history were all reviewed and documented as reviewed in the EPIC chart.  ROS:  Performed with pertinent positives and negatives included in the history, assessment and plan.   Additional significant findings : None   Exam: [name] assistant Vitals:   09/03/18 1257  BP: 122/76  Weight: 136 lb (61.7 kg)  Height: 5\' 6"  (1.676 m)   Body mass index is 21.95 kg/m.  General appearance:  Normal affect, orientation and appearance. Skin: Grossly normal HEENT: Without gross lesions.  No cervical or supraclavicular adenopathy. Thyroid normal.  Lungs:  Clear without wheezing, rales or rhonchi Cardiac: RR, without RMG Abdominal:  Soft, nontender, without masses, guarding, rebound, organomegaly or hernia Breasts:  Examined lying and sitting without masses, retractions, discharge or axillary adenopathy. Pelvic:  Ext, BUS, Vagina: With atrophic changes  Cervix: With atrophic changes  Uterus: Anteverted, normal size, shape and contour, midline and mobile nontender   Adnexa: Without masses or tenderness    Anus and perineum: Normal   Rectovaginal: Normal sphincter tone without palpated masses or tenderness.    Assessment/Plan:  63 y.o. G0P0 female for annual gynecologic exam.   1. Postmenopausal/atrophic genital changes.  Continues to have issues with dyspareunia.  Is using Premarin vaginal cream 1 g twice weekly.  Had been on HRT but discontinued last year.  Had used Vagifem, formulated estradiol vaginal cream and Intrarosa in the past but still having vaginal dryness and pain with intercourse.  She is doing well otherwise without menopausal symptoms.  We discussed options to include Estring and Josph Macho laser treatment.  We discussed the issues with  Josph Macho as far as no long-term proven efficacy/safety.  At this point recommend increasing to using Premarin vaginal cream 1-1/2 g 3 times weekly and see how she does with this.  She has a supply but will call when needs more. 2. Osteoporosis.  DEXA 2018 T score -2.6.  Had been on Forteo as well as bisphosphate's in the past.  Had consult with Dr Cruzita Lederer and discussed Prolia but at that point she decided against doing anything.  Will check DEXA later this year at 2-year interval and then further discuss treatment options. 3. Pap smear 2019.  No Pap smear done today.  No history of abnormal Pap smears.  Plan repeat Pap smear at 3-year interval per current screening guidelines. 4. Mammography due now and she will schedule after the coronavirus restrictions lifted.  Breast exam normal today. 5. Colonoscopy 2016.  Repeat at their recommended interval. 6. Health maintenance.  No routine lab work done as patient does this elsewhere.  Follow-up 1 year, sooner as needed.   [name] MD, 1:42 PM 09/03/2018

## 2018-09-03 NOTE — Patient Instructions (Signed)
Increase the vaginal estrogen cream to 3 times weekly.  Follow-up if this continues to be an issue.  Follow-up in 1 year for annual exam

## 2018-09-15 ENCOUNTER — Other Ambulatory Visit: Payer: Self-pay | Admitting: Psychiatry

## 2018-09-26 ENCOUNTER — Telehealth: Payer: Self-pay | Admitting: Internal Medicine

## 2018-09-26 NOTE — Telephone Encounter (Signed)
LM to call back to reschedule appt for Dr Cruzita Lederer - No Show/cancellation list.

## 2018-10-15 ENCOUNTER — Ambulatory Visit: Payer: Self-pay

## 2018-10-18 ENCOUNTER — Encounter: Payer: Self-pay | Admitting: Internal Medicine

## 2018-10-18 ENCOUNTER — Ambulatory Visit (INDEPENDENT_AMBULATORY_CARE_PROVIDER_SITE_OTHER): Payer: 59 | Admitting: Internal Medicine

## 2018-10-18 DIAGNOSIS — M810 Age-related osteoporosis without current pathological fracture: Secondary | ICD-10-CM | POA: Diagnosis not present

## 2018-10-18 NOTE — Progress Notes (Signed)
Patient ID: Yolanda Carroll, female   DOB: [birth year], 63 y.o.   MRN: [id number]   Patient location: Work My location: Office  I connected with the patient on 10/18/18 at 10:19 AM EDT by a video enabled telemedicine application and verified that I am speaking with the correct person.   I discussed the limitations of evaluation and management by telemedicine and the availability of in person appointments. The patient expressed understanding and agreed to proceed.   Details of the encounter are shown below.  HPI  Yolanda Carroll is a 63 y.o.-year-old female, referred by Dr. Phineas Real, presenting for follow-up for osteoporosis.  Last visit 1.5 years ago.  She will retire next month.  She was diagnosed with osteoporosis in early 2000's.  Reviewed patient's DXA scan reports: Date L1-L4 T score FN T score 33% distal Radius (left)  Ultra distal radius (left)  02/06/2017 ([facility], [facility]) -0.3 (moderate scoliosis) RFN: -2.6 LFN: -2.2  +1.1 -0.1  12/29/2014 -0.5 (moderate scoliosis) RFN: -2.6 LFN: -1.7 n/a n/a  12/12/2012 -0.5 (moderate scoliosis) RFN: -2.4 LFN: -1.9 n/a n/a  10/04/2010 n/a RFN: -2.3 LFN: -1.8 n/a n/a   No history of fractures no dizziness/orthostasis/poor vision.  No falls.  She had one episode of vertigo for 2 weeks in 2018.  She continues to have back pain and right hip pain when walking.She is walking more. She did PT recently for OA >> she is doing these at home and yoga once a week.  She has a history of steroid courses: P.o. and IM for headaches, osteoarthritis.  Previous OP treatments:  - Actonel in 2006-2007. - Forteo for 2 years in 2008 - Boniva in 2010 - Currently on HRT: Estradiol 0.5, Provera 2.5  At last visit, I suggested Prolia but she did not decide for this yet.  No h/o vitamin D deficiency. Reviewed available vit D levels: 02/11/2018: Vit D 61.9 04/30/2017: vit D 53.4 Lab Results  Component Value Date   VD25OH 55 03/13/2017   She is on: - calcium  citrate chewables 500 mg 2x a day - Vitamin D + MVI >> total 1600 units daily  No weightbearing exercises.  She is not taking high vitamin A doses.  Menopause was at 75s y/o.   Pt does have a FH of osteoporosis in mother, who had a hip fracture.  No history of hyper/hypocalcemia or hyperparathyroidism.  No history of kidney stones. 02/11/2018: Ca normal Lab Results  Component Value Date   CALCIUM 9.5 02/21/2012   CALCIUM 9.8 07/10/2011   No history of thyrotoxicosis.  Last TSH:  02/11/2018: TSH 2.79 05/01/2017: TSH 1.71 04/28/2013: TSH 2.063 No results found for: TSH   No history of CKD. Last BUN/Cr: 02/11/2018: CMP normal, with glucose 76, BUN/creatinine 12/0.86 05/01/2017: 15/0.82 04/2013: 14/0.84 Lab Results  Component Value Date   BUN 19 02/21/2012   CREATININE 0.79 02/21/2012   On Depakote for migraines.  ROS: Constitutional: no weight gain/no weight loss, no fatigue, no subjective hyperthermia, no subjective hypothermia Eyes: no blurry vision, no xerophthalmia ENT: no sore throat, no nodules palpated in neck, no dysphagia, no odynophagia, no hoarseness Cardiovascular: no CP/no SOB/no palpitations/no leg swelling Respiratory: no cough/no SOB/no wheezing Gastrointestinal: no N/no V/no D/no C/no acid reflux Musculoskeletal: no muscle aches/+ joint aches Skin: no rashes, no hair loss Neurological: no tremors/no numbness/no tingling/no dizziness  I reviewed pt's medications, allergies, PMH, social hx, family hx, and changes were documented in the history of present illness. Otherwise, unchanged from my initial  visit note.  Past Medical History:  Diagnosis Date  . Allergy   . Arthritis   . Cervical dysplasia   . Endometriosis   . Fatigue   . GERD (gastroesophageal reflux disease)   . IBS (irritable bowel syndrome)   . Migraine   . Osteoporosis 01/2017   T score -2.6  . Plantar fasciitis   . PONV (postoperative nausea and vomiting)    also difficult to wake  up  . Recurrent vaginitis   . Reflux   . Scoliosis   . Wears glasses    Past Surgical History:  Procedure Laterality Date  . ANTERIOR CERVICAL DECOMP/DISCECTOMY FUSION  04/17/2011   Procedure: ANTERIOR CERVICAL DECOMPRESSION/DISCECTOMY FUSION 2 LEVELS;  Surgeon: Hosie Spangle;  Location: [facility];  Service: Neurosurgery;  Laterality: N/A;  Cervical five-six, six-seven anterior cervical decompression with fusion,  plating,  and bonegraft   . APPENDECTOMY  1978  . BREAST BIOPSY  07/13/2011   Procedure: BREAST BIOPSY WITH NEEDLE LOCALIZATION;  Surgeon: Rolm Bookbinder, MD;  Location: [facility];  Service: General;  Laterality: Right;  Right breast wire localization biopsy  . BREAST EXCISIONAL BIOPSY Right 2013  . BREAST SURGERY     Breast Bx-Benign  . CYSTOSCOPY    . GYNECOLOGIC CRYOSURGERY    . HERNIA REPAIR  08/02/1995   [facility]  . LAPAROSCOPIC ENDOMETRIOSIS FULGURATION  1997  . PELVIC LAPAROSCOPY    . ROTATOR CUFF REPAIR     right 2002 left 2000   Social History   Socioeconomic History  . Marital status: Married    Spouse name: Fritz Pickerel  . Number of children: 0  . 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 Smoking status: Former Smoker    Last attempt to quit: 05/17/1983    Years since quitting: 34.0  . Smokeless tobacco: Never Used  Substance and Sexual Activity  . Alcohol use: Yes    Alcohol/week: 0.6 oz    Types: 1-2 Standard drinks or equivalent per week    Comment: socially, occasional  . Drug use: No  . Sexual activity: Yes    Birth control/protection: Post-menopausal    Comment: intercourse age 56 , sexual partners more than 5  Other Topics Concern  . Not on file  Social History Narrative   Pt is married, no children.  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    Caffeine- very little.   Current Outpatient Medications on File  Prior to Visit  Medication Sig Dispense Refill  . AIMOVIG 140 MG/ML SOAJ Inject 140 mg into the skin every 30 (thirty) days. 1 pen 11  . ALPRAZolam (XANAX) 0.25 MG tablet TAKE ONE TABLET BY MOUTH TWICE DAILY (Patient taking differently: Take 0.25 mg by mouth at bedtime. ) 60 tablet 2  . Ascorbic Acid (VITAMIN C PO) Take 1 tablet by mouth daily. 500mg  capsule    . baclofen (LIORESAL) 10 MG tablet 10 mg as needed.    Marland Kitchen BEPREVE 1.5 % SOLN     . Calcium Carbonate (CALCIUM 600 PO) Take 1 tablet by mouth 2 (two) times daily.     . chlorproMAZINE (THORAZINE) 100 MG tablet Take 1 tablet (100 mg total) by mouth 2 (two) times daily. For 3 days. 6 tablet 3  . Cholecalciferol (VITAMIN D PO) Take 800 Units by mouth daily.     Marland Kitchen conjugated estrogens (PREMARIN) vaginal  cream Place 1 Applicatorful vaginally 2 (two) times a week. 42.5 g 6  . DEPAKOTE ER 250 MG 24 hr tablet TAKE  THREE TABLETS BY MOUTH AT BEDTIME 90 tablet 5  . Gabapentin (NEURONTIN PO) Take 100 mg by mouth daily.     Marland Kitchen L-LYSINE PO Take 1 tablet by mouth daily.     Marland Kitchen lidocaine (LIDODERM) 5 % Place onto the skin as needed. Back pain    . magnesium 30 MG tablet Take 30 mg by mouth every other day.     . meclizine (ANTIVERT) 25 MG tablet Take 25 mg by mouth 3 (three) times daily as needed for dizziness.    . meloxicam (MOBIC) 7.5 MG tablet Take 7.5 mg by mouth daily.     . Multiple Vitamin (MULTIVITAMIN) tablet Take 1 tablet by mouth daily.    . nefazodone (SERZONE) 150 MG tablet TAKE ONE TABLET BY MOUTH EVERY NIGHT AT BEDTIME 30 tablet 2  . pantoprazole (PROTONIX) 40 MG tablet Take 40 mg by mouth 2 (two) times daily.    . valACYclovir (VALTREX) 500 MG tablet Take 500 mg by mouth 2 (two) times daily as needed.    . vitamin E 400 UNIT capsule Take 200 Units by mouth daily.      No current facility-administered medications on file prior to visit.    Allergies  Allergen Reactions  . Penicillins Other (See Comments)    Seizure as a child  .  Atarax [Hydroxyzine] Other (See Comments)    Headache, depression  . Celexa [Citalopram Hydrobromide] Other (See Comments)    Chest pain  . Erythromycin      Upset stomache  . Flagyl [Metronidazole]     Dizzy and increased heart rate  . Nortriptyline Itching  . Prilosec [Omeprazole]     Abdominal pain  . Requip [Ropinirole]     Made sx worse  . Tramadol Itching  . Darvocet [Propoxyphene N-Acetaminophen] Itching  . Percocet [Oxycodone-Acetaminophen] Itching   Family History  Problem Relation Age of Onset  . Cancer Father        lymphoma  . Other Mother        bipolar,reflux  . Bipolar disorder Mother   . Other Brother        sinus problems  . Cancer Maternal Aunt        uterine cancer  . Breast cancer Maternal Aunt        40's  . Diabetes Maternal Aunt   . Cancer Paternal Aunt        Colon cancer  . Breast cancer Cousin 73       Mat. 1st cousin    PE: There were no vitals taken for this visit. Wt Readings from Last 3 Encounters:  09/03/18 136 lb (61.7 kg)  08/01/18 138 lb (62.6 kg)  01/29/18 138 lb (62.6 kg)   Constitutional:  in NAD  The physical exam was not performed (virtual visit).  Assessment: 1. Osteoporosis  Plan: 1. Osteoporosis - Likely age-related and postmenopausal and she also has family history of osteoporosis -Reviewed together her latest bone density scans (left femoral neck bone density has decreased significantly in the last 2 years, spine is uninterpretable and ultra distal/33% distal radius T-scores are normal).  Based on the T-scores at the level of the hips, she does have an increased risk for fractures. -She is due for another bone density scan after 02/07/2019. Dr. Phineas Real will order this. -At last visit, we reviewed different medication classes for  osteoporosis and I explained that benefits and possible side effects.  I suggested Prolia, but she wanted to think about this.  For now, we will wait for the next bone density scan and then  decide whether she needs Prolia or not. -She continues on calcium and vitamin D supplement.  At last visit I advised her to reduce calcium to 600 mg only once a day and continued 1600 units vitamin D daily.  However, she tells me that she continues to take calcium 500 mg twice a day. She had labs by PCP 9 months ago and this showed a good vitamin D level and normal calcium.  We will continue with the current doses. -Recommended to do weightbearing exercises 5 out of 7 days -she is trying to do so -I will get in touch with her after her DXA scan report and discuss about future treatment.  If we do start Prolia, we can continue this for 6 to 10 years, then she will need weight loss, for example, for 1 to 2 years.  She already had 2 years of teriparatide, so we cannot use this again.  -We will then check another DXA scan in 2 years after starting Prolia.  I explained that the first indication that the treatment is working is her not having fractures.  DXA scan changes are secondary: Unchanged or slightly higher T-scores are desirable. -I will see the patient back in a year, but we will be in touch with her sooner.  Also, if we do start Prolia, I will have her back to the clinic for BMP and a vitamin D level.  - time spent with the patient: 15 min, of which >50% was spent in obtaining information about her symptoms, reviewing her previous labs, evaluations, and treatments, counseling her about her condition (please see the discussed topics above), and developing a plan to further investigate and treat it; she had a number of questions which I addressed.  Philemon Kingdom, MD PhD [facility]

## 2018-10-21 ENCOUNTER — Other Ambulatory Visit: Payer: Self-pay

## 2018-10-21 MED ORDER — NEFAZODONE HCL 150 MG PO TABS: 150.0000 mg | ORAL_TABLET | Freq: Every day | ORAL | 1 refills | Status: DC

## 2018-11-05 ENCOUNTER — Other Ambulatory Visit: Payer: Self-pay | Admitting: Psychiatry

## 2018-11-06 NOTE — Telephone Encounter (Signed)
Last visit pt taking 1 at hs, change quantity?

## 2018-11-19 ENCOUNTER — Encounter: Payer: Self-pay | Admitting: Gynecology

## 2018-11-19 ENCOUNTER — Other Ambulatory Visit: Payer: Self-pay

## 2018-11-19 ENCOUNTER — Ambulatory Visit: Payer: 59 | Admitting: Gynecology

## 2018-11-19 VITALS — BP 122/78

## 2018-11-19 DIAGNOSIS — N898 Other specified noninflammatory disorders of vagina: Secondary | ICD-10-CM | POA: Diagnosis not present

## 2018-11-19 MED ORDER — FLUCONAZOLE 150 MG PO TABS: ORAL_TABLET | ORAL | 0 refills | Status: DC

## 2018-11-19 MED ORDER — TERCONAZOLE 0.4 % VA CREA: 1.0000 | TOPICAL_CREAM | Freq: Every day | VAGINAL | 0 refills | Status: DC

## 2018-11-19 NOTE — Patient Instructions (Signed)
Use the Terazol vaginal cream nightly for 7 nights.  Take the Diflucan pill now, mid Terazol course and at the end of the Terazol course for a total of 3 pills.

## 2018-11-19 NOTE — Progress Notes (Signed)
    Yolanda Carroll [birth year] [id number]        63 y.o.  G0P0 presents with several weeks of vaginal irritation and discharge.  Also itching.  No urinary symptoms such as frequency dysuria urgency low back pain fever or chills.  Used Terazol cream that she had at home over a week ago but did not notice much improvement.  Past medical history,surgical history, problem list, medications, allergies, family history and social history were all reviewed and documented in the EPIC chart.  Directed ROS with pertinent positives and negatives documented in the history of present illness/assessment and plan.  Exam: [name] assistant Vitals:   11/19/18 1110  BP: 122/78   General appearance:  Normal Abdomen soft nontender without masses guarding rebound Pelvic external BUS vagina with atrophic changes.  Clumpy white discharge noted.  Cervix with atrophic changes.  Uterus normal size midline mobile nontender.  Adnexa without masses or tenderness.  Assessment/Plan:  63 y.o. G0P0 with history as above.  Wet prep is positive for yeast.  She does tend to have refractile yeast infections.  Will treat with Terazol 7 day cream nightly x7 nights.  Will treat with Diflucan 1 tablet now, repeat in 3 to 4 days and then 1 more at the end of her Terazol course for a total of 3 tablets.  She will follow-up if her symptoms persist, worsen or recur.    [name] MD, 11:25 AM 11/19/2018

## 2018-11-20 LAB — WET PREP FOR TRICH, YEAST, CLUE

## 2018-11-25 ENCOUNTER — Other Ambulatory Visit: Payer: Self-pay

## 2018-11-25 ENCOUNTER — Ambulatory Visit
Admission: RE | Admit: 2018-11-25 | Discharge: 2018-11-25 | Disposition: A | Discharge status: Home / Self Care | Payer: 59 | Source: Ambulatory Visit | Attending: Gynecology | Admitting: Gynecology

## 2018-11-25 DIAGNOSIS — Z1231 Encounter for screening mammogram for malignant neoplasm of breast: Secondary | ICD-10-CM

## 2018-12-04 ENCOUNTER — Encounter: Payer: Self-pay | Admitting: Psychiatry

## 2018-12-04 ENCOUNTER — Ambulatory Visit (INDEPENDENT_AMBULATORY_CARE_PROVIDER_SITE_OTHER): Payer: 59 | Admitting: Psychiatry

## 2018-12-04 ENCOUNTER — Other Ambulatory Visit: Payer: Self-pay

## 2018-12-04 DIAGNOSIS — F411 Generalized anxiety disorder: Secondary | ICD-10-CM | POA: Diagnosis not present

## 2018-12-04 DIAGNOSIS — G43009 Migraine without aura, not intractable, without status migrainosus: Secondary | ICD-10-CM | POA: Diagnosis not present

## 2018-12-04 DIAGNOSIS — F5105 Insomnia due to other mental disorder: Secondary | ICD-10-CM

## 2018-12-04 MED ORDER — ALPRAZOLAM 0.25 MG PO TABS: 0.2500 mg | ORAL_TABLET | Freq: Two times a day (BID) | ORAL | 5 refills | Status: DC

## 2018-12-04 MED ORDER — NEFAZODONE HCL 150 MG PO TABS: 150.0000 mg | ORAL_TABLET | Freq: Every day | ORAL | 1 refills | Status: DC

## 2018-12-04 MED ORDER — DEPAKOTE ER 250 MG PO TB24: 750.0000 mg | ORAL_TABLET | Freq: Every day | ORAL | 5 refills | Status: DC

## 2018-12-04 NOTE — Progress Notes (Signed)
Yolanda Carroll [id number] 05/21/1955 63 y.o.  Subjective:   Patient ID:  Yolanda Carroll is a 63 y.o. (DOB 1955-10-08) female.  Chief Complaint:  Chief Complaint  Patient presents with  . Follow-up    Medication Management  and migraine  . Anxiety    Medication Management    Anxiety Patient reports no confusion, decreased concentration, nervous/anxious behavior or suicidal ideas.     Yolanda Carroll presents to the office today for follow-up of anxiety and migraine.  Last seen in January.  No meds were changed.  Retired July 2.  Was so burned out at work.  Helping with church projects and helping friends.  Focusing on her health and doing PT for back and hip.  No kids or gkids.  Helps care for 40 yo M-in-law.  H is also busy which helps.  Given a surprise retirement party.    Sleep not great lately with leg cramps.  Started more exercise.  Started snoring and H is a light sleeper.  Meds help.  Patient reports stable mood and denies depressed or irritable moods.  Patient denies any recent difficulty with anxiety.  Patient denies difficulty with sleep initiation.. Denies appetite disturbance.  Patient reports that energy and motivation have been good.  Patient denies any difficulty with concentration.  Patient denies any suicidal ideation.  Insomnia managed with  With alprazolam.  Asks about how to taper.  H running for office.    Saw neurologist for Migraine and had problems with the meds RX.  Aimovig has helped..  HA last a week when they occur.  Depakote has lost some benefit but they are not severe.  Past Psychiatric Medication Trials: Failed multiple other antidepressants, nefazodone, Depakote for migraine, citalopram palpitations   Review of Systems:  Review of Systems  Neurological: Positive for headaches. Negative for tremors and weakness.  Psychiatric/Behavioral: Negative for agitation, behavioral problems, confusion, decreased concentration, dysphoric mood,  hallucinations, self-injury, sleep disturbance and suicidal ideas. The patient is not nervous/anxious and is not hyperactive.     Medications: I have reviewed the patient's current medications.  Current Outpatient Medications  Medication Sig Dispense Refill  . AIMOVIG 140 MG/ML SOAJ Inject 140 mg into the skin every 30 (thirty) days. 1 pen 11  . ALPRAZolam (XANAX) 0.25 MG tablet TAKE ONE TABLET BY MOUTH TWICE A DAY 60 tablet 5  . Ascorbic Acid (VITAMIN C PO) Take 1 tablet by mouth daily. 500mg  capsule    . baclofen (LIORESAL) 10 MG tablet 10 mg as needed.    Marland Kitchen BEPREVE 1.5 % SOLN     . Calcium Carbonate (CALCIUM 600 PO) Take 1 tablet by mouth 2 (two) times daily.     . Cholecalciferol (VITAMIN D PO) Take 800 Units by mouth daily.     Marland Kitchen conjugated estrogens (PREMARIN) vaginal cream Place 1 Applicatorful vaginally 2 (two) times a week. 42.5 g 6  . DEPAKOTE ER 250 MG 24 hr tablet TAKE  THREE TABLETS BY MOUTH AT BEDTIME 90 tablet 5  . Gabapentin (NEURONTIN PO) Take 100 mg by mouth daily.     Marland Kitchen L-LYSINE PO Take 1 tablet by mouth daily.     Marland Kitchen lidocaine (LIDODERM) 5 % Place onto the skin as needed. Back pain    . magnesium 30 MG tablet Take 30 mg by mouth every other day.     . meclizine (ANTIVERT) 25 MG tablet Take 25 mg by mouth 3 (three) times daily as needed for dizziness.    Marland Kitchen  meloxicam (MOBIC) 7.5 MG tablet Take 7.5 mg by mouth daily.     . Multiple Vitamin (MULTIVITAMIN) tablet Take 1 tablet by mouth daily.    . nefazodone (SERZONE) 150 MG tablet Take 1 tablet (150 mg total) by mouth at bedtime. 90 tablet 1  . pantoprazole (PROTONIX) 40 MG tablet Take 40 mg by mouth 2 (two) times daily.    . valACYclovir (VALTREX) 500 MG tablet Take 500 mg by mouth 2 (two) times daily as needed.    . vitamin E 400 UNIT capsule Take 200 Units by mouth daily.      No current facility-administered medications for this visit.     Medication Side Effects: None  Allergies:  Allergies  Allergen Reactions   . Penicillins Other (See Comments)    Seizure as a child  . Atarax [Hydroxyzine] Other (See Comments)    Headache, depression  . Celexa [Citalopram Hydrobromide] Other (See Comments)    Chest pain  . Erythromycin      Upset stomache  . Flagyl [Metronidazole]     Dizzy and increased heart rate  . Nortriptyline Itching  . Prilosec [Omeprazole]     Abdominal pain  . Requip [Ropinirole]     Made sx worse  . Tramadol Itching  . Darvocet [Propoxyphene N-Acetaminophen] Itching  . Percocet [Oxycodone-Acetaminophen] Itching    Past Medical History:  Diagnosis Date  . Allergy   . Arthritis   . Cervical dysplasia   . Endometriosis   . Fatigue   . GERD (gastroesophageal reflux disease)   . IBS (irritable bowel syndrome)   . Migraine   . Osteoporosis 01/2017   T score -2.6  . Plantar fasciitis   . PONV (postoperative nausea and vomiting)    also difficult to wake up  . Recurrent vaginitis   . Reflux   . Scoliosis   . Wears glasses     Family History  Problem Relation Age of Onset  . Cancer Father        lymphoma  . Other Mother        bipolar,reflux  . Bipolar disorder Mother   . Other Brother        sinus problems  . Cancer Maternal Aunt        uterine cancer  . Breast cancer Maternal Aunt        40's  . Diabetes Maternal Aunt   . Cancer Paternal Aunt        Colon cancer  . Breast cancer Cousin 82       Mat. 1st cousin    Social History   Socioeconomic History  . Marital status: Married    Spouse name: Fritz Pickerel  . Number of children: 0  . Years of education: 75  . Highest education level: Not on file  Occupational History    Comment: [employer]  Social Needs  . Financial resource strain: Not on file  . Food insecurity    Worry: Not on file    Inability: Not on file  . Transportation needs    Medical: Not on file    Non-medical: Not on file  Tobacco Use  . Smoking status: Former Smoker    Quit date: 05/17/1983    Years since  quitting: 35.5  . Smokeless tobacco: Never Used  Substance and Sexual Activity  . Alcohol use: Yes    Alcohol/week: 1.0 - 2.0 standard drinks    Types: 1 - 2 Standard drinks or equivalent per week  Comment: socially, occasional  . Drug use: No  . Sexual activity: Yes    Birth control/protection: Post-menopausal    Comment: intercourse age 70 , sexual partners more than 5  Lifestyle  . Physical activity    Days per week: Not on file    Minutes per session: Not on file  . Stress: Not on file  Relationships  . Social Herbalist on phone: Not on file    Gets together: Not on file    Attends religious service: Not on file    Active member of club or organization: Not on file    Attends meetings of clubs or organizations: Not on file    Relationship status: Not on file  . Intimate partner violence    Fear of current or ex partner: Not on file    Emotionally abused: Not on file    Physically abused: Not on file    Forced sexual activity: Not on file  Other Topics Concern  . Not on file  Social History Narrative   Pt is married, no children.  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    Caffeine- very little.   Right handed   Lives at home with her husband    Past Medical History, Surgical history, Social history, and Family history were reviewed and updated as appropriate.   Please see review of systems for further details on the patient's review from today.   Objective:   Physical Exam:  There were no vitals taken for this visit.  Physical Exam Constitutional:      General: She is not in acute distress.    Appearance: She is well-developed.  Musculoskeletal:        General: No deformity.  Neurological:     Mental Status: She is alert and oriented to person, place, and time.     Motor: No tremor.     Coordination: Coordination normal.     Gait: Gait normal.  Psychiatric:        Attention and Perception: Attention and perception normal.         Mood and Affect: Mood is not anxious or depressed. Affect is not labile, blunt, angry or inappropriate.        Speech: Speech normal.        Behavior: Behavior normal.        Thought Content: Thought content normal. Thought content does not include homicidal or suicidal ideation. Thought content does not include homicidal or suicidal plan.        Cognition and Memory: Cognition normal.        Judgment: Judgment normal.     Comments: Insight intact. No auditory or visual hallucinations. No delusions.      Lab Review:     Component Value Date/Time   NA 139 02/21/2012 1746   K 3.4 (L) 02/21/2012 1746   CL 100 02/21/2012 1746   CO2 29 02/21/2012 1746   GLUCOSE 85 02/21/2012 1746   BUN 19 02/21/2012 1746   CREATININE 0.79 02/21/2012 1746   CALCIUM 9.5 02/21/2012 1746   PROT 7.6 02/21/2012 1746   ALBUMIN 4.2 02/21/2012 1746   AST 18 02/21/2012 1746   ALT 12 02/21/2012 1746   ALKPHOS 36 (L) 02/21/2012 1746   BILITOT 0.3 02/21/2012 1746   GFRNONAA >90 02/21/2012 1746   GFRAA >90 02/21/2012 1746       Component Value Date/Time   WBC 5.4 02/21/2012 1746   RBC 4.35 02/21/2012 1746  HGB 13.3 02/21/2012 1746   HCT 39.6 02/21/2012 1746   PLT 213 02/21/2012 1746   MCV 91.0 02/21/2012 1746   MCH 30.6 02/21/2012 1746   MCHC 33.6 02/21/2012 1746   RDW 12.8 02/21/2012 1746   LYMPHSABS 2.5 02/21/2012 1746   MONOABS 0.7 02/21/2012 1746   EOSABS 0.1 02/21/2012 1746   BASOSABS 0.0 02/21/2012 1746    Normal liver enzymes in September.  No results found for: POCLITH, LITHIUM   No results found for: PHENYTOIN, PHENOBARB, VALPROATE, CBMZ   .res Assessment: Plan:    Yolanda Carroll was seen today for follow-up and anxiety.  Diagnoses and all orders for this visit:  Migraine without aura and without status migrainosus, not intractable  Generalized anxiety disorder  Insomnia due to mental condition   Greater than 50% of face to face time with patient was spent on counseling and  coordination of care. We discussed the following.  Disc snoring and dental appliances.  Disc discussed also dealing with the leg cramps at night.  Possible strategies addressed.  We discussed the short-term risks associated with benzodiazepines including sedation and increased fall risk among others.  Discussed long-term side effect risk including dependence, potential withdrawal symptoms, and the potential eventual dose-related risk of dementia. Taper Xanax by 1/4 of 0.25 mg daily per month if she chooses and when she chooses to do so.  Continue Depakote and Serzone which help reduce headache frequency.  She still needs name brand Depakote because she has more headaches on the generic version.  Discussed migraine headache management.  BC she got a good response from the Aimovig then we may wean the Depakote. In the future.  Answered her questions about Depakote and Serzone and liver toxicity risks.  Her liver enzymes have been very normal and she is at low risk of developing liver problems at this point because she has tolerated it well for an extended period of time.   This was a 30-minute appointment  FU 6 mos  Lynder Parents, MD, DFAPA  Please see After Visit Summary for patient specific instructions.  Future Appointments  Date Time Provider [place]  02/11/2019 10:30 AM [facility] None  08/05/2019  3:00 PM Melvenia Beam, MD [facility] None  10/17/2019 10:15 AM Philemon Kingdom, MD [facility] None    No orders of the defined types were placed in this encounter.     -------------------------------

## 2019-01-09 MED ORDER — AIMOVIG 140 MG/ML ~~LOC~~ SOAJ: 140.0000 mg | SUBCUTANEOUS | 1 refills | Status: DC

## 2019-02-10 ENCOUNTER — Other Ambulatory Visit: Payer: Self-pay

## 2019-02-11 ENCOUNTER — Telehealth: Payer: Self-pay | Admitting: *Deleted

## 2019-02-11 ENCOUNTER — Other Ambulatory Visit: Payer: Self-pay | Admitting: Gynecology

## 2019-02-11 ENCOUNTER — Other Ambulatory Visit: Payer: Self-pay

## 2019-02-11 ENCOUNTER — Ambulatory Visit (INDEPENDENT_AMBULATORY_CARE_PROVIDER_SITE_OTHER): Payer: 59

## 2019-02-11 ENCOUNTER — Encounter: Payer: Self-pay | Admitting: Gynecology

## 2019-02-11 DIAGNOSIS — Z780 Asymptomatic menopausal state: Secondary | ICD-10-CM

## 2019-02-11 DIAGNOSIS — M810 Age-related osteoporosis without current pathological fracture: Secondary | ICD-10-CM | POA: Diagnosis not present

## 2019-02-11 NOTE — Telephone Encounter (Addendum)
Called [facility], spoke with Roderic Palau who stated she had a paid claim from 01/09/19, disp  3 injections/90days supply. End date of approval on file is 03/12/19. Its too early to reauthorize. He stated that a new PA can be stated in a few weeks.  Dx: 43.709 Failed: depakote, imitrex, gabapentin, Ajovy, Cambia, Relpax, nortriptyline, ketaprofen, tramadol, nefazodone, slelaxin, baclofen.

## 2019-02-11 NOTE — Telephone Encounter (Signed)
Made a reminder to do this PA at a later date.

## 2019-02-11 NOTE — Telephone Encounter (Signed)
Aimovig 140 mg PA started on CMM under KEY: [id number]. Received this message from plan: This medication may be excluded from the patient's benefit. For more information, please reach out to [facility] directly at [phone].

## 2019-02-26 ENCOUNTER — Telehealth: Payer: Self-pay | Admitting: Internal Medicine

## 2019-02-26 DIAGNOSIS — M810 Age-related osteoporosis without current pathological fracture: Secondary | ICD-10-CM

## 2019-02-26 NOTE — Telephone Encounter (Signed)
Patient ph# [phone] would like to start getting Prolia injections. Please call patient to advise.

## 2019-02-26 NOTE — Telephone Encounter (Signed)
Patient requests to be called at ph# [phone] to discuss Bone Density Results or send Dr. Arman Filter reply to patient's MyChart.

## 2019-02-28 NOTE — Telephone Encounter (Signed)
Noted.  I reviewed the DEXA scan results and they appear worse especially at the level of her forearm.  We can definitely go ahead with Prolia, however, first we will need to obtain labs.  I will needed BMP and a vitamin D (can you please order these?)  and when I get the results we can submit her to the portal for Prolia.

## 2019-03-03 NOTE — Addendum Note (Signed)
Addended by: Cardell Peach I on: 03/03/2019 09:15 AM   Modules accepted: Orders

## 2019-03-04 ENCOUNTER — Other Ambulatory Visit: Payer: Self-pay

## 2019-03-04 ENCOUNTER — Other Ambulatory Visit (INDEPENDENT_AMBULATORY_CARE_PROVIDER_SITE_OTHER): Payer: 59

## 2019-03-04 DIAGNOSIS — M810 Age-related osteoporosis without current pathological fracture: Secondary | ICD-10-CM | POA: Diagnosis not present

## 2019-03-04 LAB — BASIC METABOLIC PANEL
BUN: 13 mg/dL (ref 6–23)
CO2: 31 mEq/L (ref 19–32)
Calcium: 9.5 mg/dL (ref 8.4–10.5)
Chloride: 100 mEq/L (ref 96–112)
Creatinine, Ser: 0.83 mg/dL (ref 0.40–1.20)
GFR: 69.37 mL/min (ref >60.00)
Glucose, Bld: 80 mg/dL (ref 70–99)
Potassium: 4.2 mEq/L (ref 3.5–5.1)
Sodium: 137 mEq/L (ref 135–145)

## 2019-03-04 LAB — VITAMIN D 25 HYDROXY (VIT D DEFICIENCY, FRACTURES): VITD: 70.27 ng/mL (ref 30.00–100.00)

## 2019-03-05 ENCOUNTER — Encounter: Payer: Self-pay | Admitting: Internal Medicine

## 2019-03-15 ENCOUNTER — Encounter: Payer: Self-pay | Admitting: Internal Medicine

## 2019-03-17 NOTE — Telephone Encounter (Signed)
[employer] called and stated they are faxing over a hardcopy of the PA form     [phone]

## 2019-03-19 ENCOUNTER — Encounter: Payer: Self-pay | Admitting: *Deleted

## 2019-03-19 NOTE — Telephone Encounter (Signed)
There was an Aimovig 140 mg PA on CMM. I completed this. [id number]. It was approved immediately by [facility].   Case Id: [id number]; Status:Approved; Review Type: Prior Auth; Coverage Start Date: 02/17/2019; Coverage End Date: 03/18/2020;   Mychart message sent to pt with update.

## 2019-04-17 ENCOUNTER — Encounter: Payer: Self-pay | Admitting: Internal Medicine

## 2019-04-17 NOTE — Telephone Encounter (Signed)
Patient called to check on Prolia status - advised that it is in process.  Patient wanted to advise that she has met her annual out of pocket and would like to get the shot before the new year. Plus if she is available between now and May 05, 2019 (before going out of town)

## 2019-04-23 ENCOUNTER — Encounter: Payer: Self-pay | Admitting: Internal Medicine

## 2019-04-23 NOTE — Progress Notes (Signed)
Received labs from PCP from 04/22/2019:  CMP normal, with BUN/creatinine 17/0.91, GFR 62, glucose 80  TSH 4.37 (0.34-4.5)  Vitamin D 55.3  Lipids: 197/87/55/126

## 2019-04-29 NOTE — Telephone Encounter (Signed)
Have attempted many times to contact patients insurance without luck.   Pt is aware and is trying to call herself as I need to know if the insurance will cover prolia and if it needs a PA.   Unfortunately for her insurance this has to be done manually instead of thru the portal as they will not share the information with amgen

## 2019-04-30 ENCOUNTER — Other Ambulatory Visit: Payer: Self-pay

## 2019-05-02 ENCOUNTER — Other Ambulatory Visit: Payer: Self-pay

## 2019-05-02 ENCOUNTER — Ambulatory Visit (INDEPENDENT_AMBULATORY_CARE_PROVIDER_SITE_OTHER): Payer: 59 | Admitting: Internal Medicine

## 2019-05-02 ENCOUNTER — Encounter: Payer: Self-pay | Admitting: Internal Medicine

## 2019-05-02 ENCOUNTER — Ambulatory Visit: Payer: 59

## 2019-05-02 VITALS — BP 118/60 | HR 79 | Ht 66.0 in | Wt 141.0 lb

## 2019-05-02 DIAGNOSIS — M810 Age-related osteoporosis without current pathological fracture: Secondary | ICD-10-CM

## 2019-05-02 MED ORDER — DENOSUMAB 60 MG/ML ~~LOC~~ SOSY
60.0000 mg | PREFILLED_SYRINGE | Freq: Once | SUBCUTANEOUS | Status: AC
  Administered 2019-05-02: 60 mg via SUBCUTANEOUS

## 2019-05-02 NOTE — Progress Notes (Signed)
Patient ID: Yolanda Carroll, female   DOB: [birth year], 63 y.o.   MRN: [id number]   This visit occurred during the SARS-CoV-2 public health emergency.  Safety protocols were in place, including screening questions prior to the visit, additional usage of staff PPE, and extensive cleaning of exam room while observing appropriate contact time as indicated for disinfecting solutions.   HPI  Yolanda Carroll is a 63 y.o.-year-old female, initially referred by Dr. Phineas Real, returning for follow-up for osteoporosis.  Last visit 6 months ago.  She was diagnosed with osteoporosis in early 2000's.  Reviewed patient's DXA scan reports and images: Date L1-L4 T score FN T score 33% distal Radius (left)  Ultra distal radius (left)  02/11/2019 ([facility]) -1.2 (-9.1%*) RFN: -3.1 LFN: -2.4 -0.1 0.0  02/06/2017 ([facility], [facility]) -0.3 (moderate scoliosis) RFN: -2.6 LFN: -2.2  +1.1 -0.1  12/29/2014 -0.5 (moderate scoliosis) RFN: -2.6 LFN: -1.7 n/a n/a  12/12/2012 -0.5 (moderate scoliosis) RFN: -2.4 LFN: -1.9 n/a n/a  10/04/2010 n/a RFN: -2.3 LFN: -1.8 n/a n/a   No history of fractures and no dizziness/orthostasis/poor vision.  No falls.  She had one episode of vertigo for 2 weeks in 2018.  She has back pain and right hip pain when walking.she was seen PT for OA >> she is doing these exercises at home now and also does yoga.  She has a history of steroid courses, both p.o. and IM for headaches, osteoarthritis.  Reviewed previous osteoporosis treatments: - Actonel in 2006-2007. - Forteo for 2 years in 2008 - Boniva in 2010 - Currently on HRT: Estradiol 0.5, Provera 2.5  At last visit 6 months ago, I suggested Prolia and she finally decided for this but we were having problems with her insurance in getting it approved.  This was finally approved and she can get the injection today.  No history of vitamin D deficiency: 04/22/2019: Vit D 55.3 Lab Results  Component Value Date   VD25OH 70.27 03/04/2019   VD25OH 55  03/13/2017  02/11/2018: Vit D 61.9 04/30/2017: vit D 53.4  She is on: - calcium citrate chewables 500 mg 2X a day - Vitamin D + MVI >> total 1600 units daily  No weightbearing exercises.  She is not taking high vitamin A doses.  Menopause was at 8s y/o.   Pt does have a FH of osteoporosis in mother, who had a hip fracture.  No history of hyper or hypocalcemia or hyperparathyroidism.  No history of kidney stones. Lab Results  Component Value Date   CALCIUM 9.5 03/04/2019   CALCIUM 9.5 02/21/2012   CALCIUM 9.8 07/10/2011   No history of thyrotoxicosis: 04/22/2019: TSH 4.37 (0.34-4.5) 02/11/2018: TSH 2.79 05/01/2017: TSH 1.71 04/28/2013: TSH 2.063 No results found for: TSH   No history of CKD. Last BUN/Cr: 04/22/2019: CMP normal, with BUN/creatinine 17/0.91, GFR 62, glucose 80 02/11/2018: CMP normal, with glucose 76, BUN/creatinine 12/0.86 05/01/2017: 15/0.82 04/2013: 14/0.84 Lab Results  Component Value Date   BUN 13 03/04/2019   CREATININE 0.83 03/04/2019   On Depakote for migraines.  ROS: Constitutional: no weight gain/no weight loss, no fatigue, no subjective hyperthermia, no subjective hypothermia Eyes: no blurry vision, no xerophthalmia ENT: no sore throat, no nodules palpated in neck, no dysphagia, no odynophagia, no hoarseness Cardiovascular: no CP/no SOB/no palpitations/no leg swelling Respiratory: no cough/no SOB/no wheezing Gastrointestinal: no N/no V/no D/no C/no acid reflux Musculoskeletal: no muscle aches/no joint aches Skin: no rashes, no hair loss Neurological: no tremors/no numbness/no tingling/no dizziness, + HAs  I reviewed pt's medications, allergies, PMH, social hx, family hx, and changes were documented in the history of present illness. Otherwise, unchanged from my initial visit note.  Past Medical History:  Diagnosis Date  . Allergy   . Arthritis   . Cervical dysplasia   . Endometriosis   . Fatigue   . GERD (gastroesophageal reflux  disease)   . IBS (irritable bowel syndrome)   . Migraine   . Osteoporosis 01/2019   T score -3.1  . Plantar fasciitis   . PONV (postoperative nausea and vomiting)    also difficult to wake up  . Recurrent vaginitis   . Reflux   . Scoliosis   . Wears glasses    Past Surgical History:  Procedure Laterality Date  . ANTERIOR CERVICAL DECOMP/DISCECTOMY FUSION  04/17/2011   Procedure: ANTERIOR CERVICAL DECOMPRESSION/DISCECTOMY FUSION 2 LEVELS;  Surgeon: Hosie Spangle;  Location: [facility];  Service: Neurosurgery;  Laterality: N/A;  Cervical five-six, six-seven anterior cervical decompression with fusion,  plating,  and bonegraft   . APPENDECTOMY  1978  . BREAST BIOPSY  07/13/2011   Procedure: BREAST BIOPSY WITH NEEDLE LOCALIZATION;  Surgeon: Rolm Bookbinder, MD;  Location: [facility];  Service: General;  Laterality: Right;  Right breast wire localization biopsy  . BREAST EXCISIONAL BIOPSY Right 2013  . BREAST SURGERY     Breast Bx-Benign  . CYSTOSCOPY    . GYNECOLOGIC CRYOSURGERY    . HERNIA REPAIR  08/02/1995   RIH  . LAPAROSCOPIC ENDOMETRIOSIS FULGURATION  1997  . PELVIC LAPAROSCOPY    . ROTATOR CUFF REPAIR     right 2002 left 2000   Social History   Socioeconomic History  . Marital status: Married    Spouse name: Fritz Pickerel  . Number of children: 0  . 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 Smoking status: Former Smoker    Last attempt to quit: 05/17/1983    Years since quitting: 34.0  . Smokeless tobacco: Never Used  Substance and Sexual Activity  . Alcohol use: Yes    Alcohol/week: 0.6 oz    Types: 1-2 Standard drinks or equivalent per week    Comment: socially, occasional  . Drug use: No  . Sexual activity: Yes    Birth control/protection: Post-menopausal    Comment: intercourse age 6 , sexual partners more than 5  Other Topics Concern  .  Not on file  Social History Narrative   Pt is married, no children.  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    Caffeine- very little.   Current Outpatient Medications on File Prior to Visit  Medication Sig Dispense Refill  . AIMOVIG 140 MG/ML SOAJ Inject 140 mg into the skin every 30 (thirty) days. 3 pen 1  . ALPRAZolam (XANAX) 0.25 MG tablet Take 1 tablet (0.25 mg total) by mouth 2 (two) times daily. 60 tablet 5  . Ascorbic Acid (VITAMIN C PO) Take 1 tablet by mouth daily. 500mg  capsule    . baclofen (LIORESAL) 10 MG tablet 10 mg as needed.    Marland Kitchen BEPREVE 1.5 % SOLN     . Calcium Carbonate (CALCIUM 600 PO) Take 1 tablet by mouth 2 (two) times daily.     . Cholecalciferol (VITAMIN D PO) Take 800 Units by mouth daily.     Marland Kitchen conjugated estrogens (PREMARIN) vaginal cream Place 1 Applicatorful vaginally 2 (  two) times a week. 42.5 g 6  . DEPAKOTE ER 250 MG 24 hr tablet Take 3 tablets (750 mg total) by mouth at bedtime. 90 tablet 5  . Gabapentin (NEURONTIN PO) Take 100 mg by mouth daily.     Marland Kitchen L-LYSINE PO Take 1 tablet by mouth daily.     Marland Kitchen lidocaine (LIDODERM) 5 % Place onto the skin as needed. Back pain    . magnesium 30 MG tablet Take 30 mg by mouth every other day.     . meclizine (ANTIVERT) 25 MG tablet Take 25 mg by mouth 3 (three) times daily as needed for dizziness.    . meloxicam (MOBIC) 7.5 MG tablet Take 7.5 mg by mouth daily.     . Multiple Vitamin (MULTIVITAMIN) tablet Take 1 tablet by mouth daily.    . nefazodone (SERZONE) 150 MG tablet Take 1 tablet (150 mg total) by mouth at bedtime. 90 tablet 1  . pantoprazole (PROTONIX) 40 MG tablet Take 40 mg by mouth 2 (two) times daily.    . valACYclovir (VALTREX) 500 MG tablet Take 500 mg by mouth 2 (two) times daily as needed.    . vitamin E 400 UNIT capsule Take 200 Units by mouth daily.      No current facility-administered medications on file prior to visit.   Allergies  Allergen Reactions  . Penicillins Other (See  Comments)    Seizure as a child  . Atarax [Hydroxyzine] Other (See Comments)    Headache, depression  . Celexa [Citalopram Hydrobromide] Other (See Comments)    Chest pain  . Erythromycin      Upset stomache  . Flagyl [Metronidazole]     Dizzy and increased heart rate  . Nortriptyline Itching  . Prilosec [Omeprazole]     Abdominal pain  . Requip [Ropinirole]     Made sx worse  . Tramadol Itching  . Darvocet [Propoxyphene N-Acetaminophen] Itching  . Percocet [Oxycodone-Acetaminophen] Itching   Family History  Problem Relation Age of Onset  . Cancer Father        lymphoma  . Other Mother        bipolar,reflux  . Bipolar disorder Mother   . Other Brother        sinus problems  . Cancer Maternal Aunt        uterine cancer  . Breast cancer Maternal Aunt        40's  . Diabetes Maternal Aunt   . Cancer Paternal Aunt        Colon cancer  . Breast cancer Cousin 30       Mat. 1st cousin    PE: BP 118/60   Pulse 79   Ht 5\' 6"  (1.676 m)   Wt 141 lb (64 kg)   SpO2 98%   BMI 22.76 kg/m  Wt Readings from Last 3 Encounters:  05/02/19 141 lb (64 kg)  09/03/18 136 lb (61.7 kg)  08/01/18 138 lb (62.6 kg)   Constitutional: normal weight, in NAD Eyes: PERRLA, EOMI, no exophthalmos ENT: moist mucous membranes, no thyromegaly, no cervical lymphadenopathy Cardiovascular: RRR, No MRG Respiratory: CTA B Gastrointestinal: abdomen soft, NT, ND, BS+ Musculoskeletal: no deformities, strength intact in all 4 Skin: moist, warm, no rashes Neurological: no tremor with outstretched hands, DTR normal in all 4  Assessment: 1. Osteoporosis  Plan: 1. Osteoporosis -Likely age-related and postmenopausal as she also has a family history of osteoporosis -We reviewed together her latest bone density scan from earlier this year.  Her lumbar spine T score has decreased significantly, by 9%.  Also, femoral neck T-scores are lower. -At last visit we discussed about different medication classes  for osteoporosis and I explained the benefits and possible side effects.  I did suggest Prolia and she agreed to start this -we will give first injection today.  Given coupon card to see if this can reduce her co-pay. -We discussed about continuing Prolia for 6 to 10 years -I also discussed that this therapy will be able to decrease her fracture risk by 50%.  We reviewed again possible side effects (ONJ, atypical fractures), which are, however, very rare. -We discussed about the recommendation to do weightbearing exercises 5 to 7 days of the week and she is trying to do so -We plan to check another bone density scan in 2 years after starting Prolia.  I explained that the first indication that treatment is working is her not having fractures, BMD changes are secondary: Unchanged or slightly higher T-scores are desirable. -She is on good replacement with vitamin D and recent vitamin D level from 10 days ago was excellent -I will see her back in a year, but she will check with her insurance whether she has to have an office visit in the time of her Prolia injection.  If so, I will see her back in 6 months.  - time spent with the patient: 15 minutes, of which >50% was spent in obtaining information about her symptoms, reviewing her previous labs, evaluations, and treatments, counseling her about her condition (please see the discussed topics above), and developing a plan to further investigate and treat it; she had a number of questions which I addressed.  Philemon Kingdom, MD PhD [facility] Endocrinology

## 2019-05-02 NOTE — Addendum Note (Signed)
Addended by: Cardell Peach I on: 05/02/2019 09:08 AM   Modules accepted: Orders

## 2019-05-02 NOTE — Progress Notes (Signed)
Per orders of Dr. Gherghe injection of Prolia given today by Vanessa Kampf, Certified Medical Assistant . Patient tolerated injection well.  

## 2019-05-02 NOTE — Patient Instructions (Signed)
Please get the Prolia injection today.  Please come back for a follow-up appointment in 1 year.

## 2019-05-12 ENCOUNTER — Telehealth: Payer: Self-pay | Admitting: *Deleted

## 2019-05-12 MED ORDER — PREMARIN 0.625 MG/GM VA CREA: 1.0000 | TOPICAL_CREAM | VAGINAL | 3 refills | Status: DC

## 2019-05-12 NOTE — Telephone Encounter (Signed)
Patient called requesting premarin be sent to a new pharmacy [facility] [address]. Patient asked if I would sent 1 tube this will equal 3 month supply for her. Rx sent.

## 2019-05-19 ENCOUNTER — Ambulatory Visit: Payer: 59 | Admitting: Internal Medicine

## 2019-06-05 ENCOUNTER — Ambulatory Visit (INDEPENDENT_AMBULATORY_CARE_PROVIDER_SITE_OTHER): Payer: 59 | Admitting: Psychiatry

## 2019-06-05 ENCOUNTER — Encounter: Payer: Self-pay | Admitting: Psychiatry

## 2019-06-05 DIAGNOSIS — G43009 Migraine without aura, not intractable, without status migrainosus: Secondary | ICD-10-CM | POA: Diagnosis not present

## 2019-06-05 DIAGNOSIS — F411 Generalized anxiety disorder: Secondary | ICD-10-CM | POA: Diagnosis not present

## 2019-06-05 DIAGNOSIS — F5105 Insomnia due to other mental disorder: Secondary | ICD-10-CM

## 2019-06-05 NOTE — Progress Notes (Signed)
Yolanda Carroll [id number] June 15, 1955 64 y.o.  Virtual Visit via Telephone Note  I connected with pt by telephone and verified that I am speaking with the correct person using two identifiers.   I discussed the limitations, risks, security and privacy concerns of performing an evaluation and management service by telephone and the availability of in person appointments. I also discussed with the patient that there may be a patient responsible charge related to this service. The patient expressed understanding and agreed to proceed.  I discussed the assessment and treatment plan with the patient. The patient was provided an opportunity to ask questions and all were answered. The patient agreed with the plan and demonstrated an understanding of the instructions.   The patient was advised to call back or seek an in-person evaluation if the symptoms worsen or if the condition fails to improve as anticipated.  I provided 30 minutes of non-face-to-face time during this encounter. The call started at 330 and ended at 400. The patient was located at home and the provider was located office.  Subjective:   Patient ID:  Yolanda Carroll is a 64 y.o. (DOB February 10, 1956) female.  Chief Complaint:  Chief Complaint  Patient presents with  . Follow-up    Medication Management  . Anxiety    Medication Management    Anxiety Patient reports no confusion, decreased concentration, nervous/anxious behavior or suicidal ideas.     [name] presents to the office today for follow-up of anxiety and migraine.  Last seen in July 2020.  No meds were changed.  She requires name brand Depakote because the generic caused worsening headaches.  Retired November 14, 2018.  Was so burned out at work.  Helping with church projects and helping friends.  Focusing on her health and doing PT for back and hip.  No kids or gkids.  Helps care for 24 yo M-in-law.  H is also busy which helps.  Given a surprise retirement  party.     Has travelled some with family events.  M-in-law 72 soon.    HA good and PCP suggested trial taper Depakote.  Reduced to 250 mg daily and done OK.    Don't do season changes well and more HA in Spring often.  Sleep not great lately with leg cramps.  Started more exercise.  Started snoring and H is a light sleeper.  Meds help.  Patient reports stable mood and denies depressed or irritable moods.  Patient denies any recent difficulty with anxiety.  Patient denies difficulty with sleep initiation with Xanax.. Denies appetite disturbance.  Patient reports that energy and motivation have been good.  Patient denies any difficulty with concentration.  Patient denies any suicidal ideation.  Insomnia managed with  With alprazolam.  Asks about how to taper.  No sig anxiety daytime usually.   H running for office.    Saw neurologist for Migraine and had problems with the meds RX.  Aimovig has helped..    Past Psychiatric Medication Trials: Failed multiple other antidepressants, nefazodone & Depakote for migraine,  citalopram palpitations ? Ever taken trazodone  Review of Systems:  Review of Systems  Musculoskeletal: Positive for arthralgias.  Neurological: Negative for tremors, weakness and headaches.  Psychiatric/Behavioral: Negative for agitation, behavioral problems, confusion, decreased concentration, dysphoric mood, hallucinations, self-injury, sleep disturbance and suicidal ideas. The patient is not nervous/anxious and is not hyperactive.     Medications: I have reviewed the patient's current medications.  Current Outpatient Medications  Medication Sig Dispense  Refill  . AIMOVIG 140 MG/ML SOAJ Inject 140 mg into the skin every 30 (thirty) days. 3 pen 1  . ALPRAZolam (XANAX) 0.25 MG tablet Take 1 tablet (0.25 mg total) by mouth 2 (two) times daily. 60 tablet 5  . Ascorbic Acid (VITAMIN C PO) Take 1 tablet by mouth daily. 500mg  capsule    . baclofen (LIORESAL) 10 MG tablet 10  mg as needed.    Marland Kitchen BEPREVE 1.5 % SOLN     . Calcium Carbonate (CALCIUM 600 PO) Take 1 tablet by mouth 2 (two) times daily.     . Cholecalciferol (VITAMIN D PO) Take 800 Units by mouth daily.     Marland Kitchen conjugated estrogens (PREMARIN) vaginal cream Place 1 Applicatorful vaginally 2 (two) times a week. 42.5 g 3  . denosumab (PROLIA) 60 MG/ML SOSY injection Inject 60 mg into the skin every 6 (six) months.    . DEPAKOTE ER 250 MG 24 hr tablet Take 3 tablets (750 mg total) by mouth at bedtime. (Patient taking differently: Take 250 mg by mouth at bedtime. ) 90 tablet 5  . Gabapentin (NEURONTIN PO) Take 100 mg by mouth daily.     Marland Kitchen L-LYSINE PO Take 1 tablet by mouth daily.     Marland Kitchen lidocaine (LIDODERM) 5 % Place onto the skin as needed. Back pain    . magnesium 30 MG tablet Take 30 mg by mouth every other day.     . meclizine (ANTIVERT) 25 MG tablet Take 25 mg by mouth 3 (three) times daily as needed for dizziness.    . meloxicam (MOBIC) 7.5 MG tablet Take 7.5 mg by mouth daily.     . Multiple Vitamin (MULTIVITAMIN) tablet Take 1 tablet by mouth daily.    . nefazodone (SERZONE) 150 MG tablet Take 1 tablet (150 mg total) by mouth at bedtime. 90 tablet 1  . pantoprazole (PROTONIX) 40 MG tablet Take 40 mg by mouth 2 (two) times daily.    . valACYclovir (VALTREX) 500 MG tablet Take 500 mg by mouth 2 (two) times daily as needed.    . vitamin E 400 UNIT capsule Take 200 Units by mouth daily.      No current facility-administered medications for this visit.    Medication Side Effects: None  Allergies:  Allergies  Allergen Reactions  . Penicillins Other (See Comments)    Seizure as a child  . Atarax [Hydroxyzine] Other (See Comments)    Headache, depression  . Celexa [Citalopram Hydrobromide] Other (See Comments)    Chest pain  . Erythromycin      Upset stomache  . Flagyl [Metronidazole]     Dizzy and increased heart rate  . Nortriptyline Itching  . Prilosec [Omeprazole]     Abdominal pain  .  Requip [Ropinirole]     Made sx worse  . Tramadol Itching  . Darvocet [Propoxyphene N-Acetaminophen] Itching  . Percocet [Oxycodone-Acetaminophen] Itching    Past Medical History:  Diagnosis Date  . Allergy   . Arthritis   . Cervical dysplasia   . Endometriosis   . Fatigue   . GERD (gastroesophageal reflux disease)   . IBS (irritable bowel syndrome)   . Migraine   . Osteoporosis 01/2019   T score -3.1  . Plantar fasciitis   . PONV (postoperative nausea and vomiting)    also difficult to wake up  . Recurrent vaginitis   . Reflux   . Scoliosis   . Wears glasses     Family History  Problem Relation Age of Onset  . Cancer Father        lymphoma  . Other Mother        bipolar,reflux  . Bipolar disorder Mother   . Other Brother        sinus problems  . Cancer Maternal Aunt        uterine cancer  . Breast cancer Maternal Aunt        40's  . Diabetes Maternal Aunt   . Cancer Paternal Aunt        Colon cancer  . Breast cancer Cousin 11       Mat. 1st cousin    Social History   Socioeconomic History  . Marital status: Married    Spouse name: Fritz Pickerel  . Number of children: 0  . Years of education: 54  . Highest education level: Not on file  Occupational History    Comment: [employer]  Tobacco Use  . Smoking status: Former Smoker    Quit date: 05/17/1983    Years since quitting: 36.0  . Smokeless tobacco: Never Used  Substance and Sexual Activity  . Alcohol use: Yes    Alcohol/week: 1.0 - 2.0 standard drinks    Types: 1 - 2 Standard drinks or equivalent per week    Comment: socially, occasional  . Drug use: No  . Sexual activity: Yes    Birth control/protection: Post-menopausal    Comment: intercourse age 25 , sexual partners more than 5  Other Topics Concern  . Not on file  Social History Narrative   Pt is married, no children.  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    Caffeine- very little.   Right handed   Lives at  home with her husband   Social Determinants of Health   Financial Resource Strain:   . Difficulty of Paying Living Expenses: Not on file  Food Insecurity:   . Worried About Charity fundraiser in the Last Year: Not on file  . Ran Out of Food in the Last Year: Not on file  Transportation Needs:   . Lack of Transportation (Medical): Not on file  . Lack of Transportation (Non-Medical): Not on file  Physical Activity:   . Days of Exercise per Week: Not on file  . Minutes of Exercise per Session: Not on file  Stress:   . Feeling of Stress : Not on file  Social Connections:   . Frequency of Communication with Friends and Family: Not on file  . Frequency of Social Gatherings with Friends and Family: Not on file  . Attends Religious Services: Not on file  . Active Member of Clubs or Organizations: Not on file  . Attends Archivist Meetings: Not on file  . Marital Status: Not on file  Intimate Partner Violence:   . Fear of Current or Ex-Partner: Not on file  . Emotionally Abused: Not on file  . Physically Abused: Not on file  . Sexually Abused: Not on file    Past Medical History, Surgical history, Social history, and Family history were reviewed and updated as appropriate.   Please see review of systems for further details on the patient's review from today.   Objective:   Physical Exam:  There were no vitals taken for this visit.  Physical Exam Neurological:     Mental Status: She is alert and oriented to person, place, and time.     Cranial Nerves: No dysarthria.  Psychiatric:  Attention and Perception: Attention and perception normal.        Mood and Affect: Mood normal.        Speech: Speech normal.        Behavior: Behavior is cooperative.        Thought Content: Thought content normal. Thought content is not paranoid or delusional. Thought content does not include homicidal or suicidal ideation. Thought content does not include homicidal or suicidal  plan.        Cognition and Memory: Cognition and memory normal.        Judgment: Judgment normal.     Comments: Insight intact     Lab Review:     Component Value Date/Time   NA 137 03/04/2019 0949   K 4.2 03/04/2019 0949   CL 100 03/04/2019 0949   CO2 31 03/04/2019 0949   GLUCOSE 80 03/04/2019 0949   BUN 13 03/04/2019 0949   CREATININE 0.83 03/04/2019 0949   CALCIUM 9.5 03/04/2019 0949   PROT 7.6 02/21/2012 1746   ALBUMIN 4.2 02/21/2012 1746   AST 18 02/21/2012 1746   ALT 12 02/21/2012 1746   ALKPHOS 36 (L) 02/21/2012 1746   BILITOT 0.3 02/21/2012 1746   GFRNONAA >90 02/21/2012 1746   GFRAA >90 02/21/2012 1746       Component Value Date/Time   WBC 5.4 02/21/2012 1746   RBC 4.35 02/21/2012 1746   HGB 13.3 02/21/2012 1746   HCT 39.6 02/21/2012 1746   PLT 213 02/21/2012 1746   MCV 91.0 02/21/2012 1746   MCH 30.6 02/21/2012 1746   MCHC 33.6 02/21/2012 1746   RDW 12.8 02/21/2012 1746   LYMPHSABS 2.5 02/21/2012 1746   MONOABS 0.7 02/21/2012 1746   EOSABS 0.1 02/21/2012 1746   BASOSABS 0.0 02/21/2012 1746    Normal liver enzymes in September.  No results found for: POCLITH, LITHIUM   No results found for: PHENYTOIN, PHENOBARB, VALPROATE, CBMZ   .res Assessment: Plan:    Armahni was seen today for follow-up and anxiety.  Diagnoses and all orders for this visit:  Generalized anxiety disorder  Migraine without aura and without status migrainosus, not intractable  Insomnia due to mental condition    Greater than 50% of 30 min non face to face time with patient was spent on counseling and coordination of care. We discussed the following.  We discussed the short-term risks associated with benzodiazepines including sedation and increased fall risk among others.  Discussed long-term side effect risk including dependence, potential withdrawal symptoms, and the potential eventual dose-related risk of dementia. Disc newer studies that cast in doubt the relationship  with dementia. Taper Xanax by 1/4 of 0.25 mg daily per month if she chooses and when she chooses to do so.   Continue Depakote and Serzone which help reduce headache frequency.  She still needs name brand Depakote because she has more headaches on the generic version.  Discussed migraine headache management.  BC she got a good response from the Aimovig then we may wean the Depakote. In the future.  Will have to wean off nefazodone.  Disc that it is no longer made and will have to stop.  It was originally given to her by Dr. Earley Favor for migraine and may not be needed anymore since HA managed with Aimovig.  Disc risk of rebound insomnia, anxiety and depression.  Answered her questions about Depakote and Serzone and liver toxicity risks.  Her liver enzymes have been very normal and she is at low risk  of developing liver problems at this point because she has tolerated it well for an extended period of time.   This was a 30-minute appointment  FU 6 mos  Lynder Parents, MD, DFAPA  Please see After Visit Summary for patient specific instructions.  Future Appointments  Date Time Provider [place]  08/05/2019  3:00 PM Melvenia Beam, MD [facility] None  12/04/2019  9:00 AM Philemon Kingdom, MD [facility] None    No orders of the defined types were placed in this encounter.     -------------------------------

## 2019-07-07 ENCOUNTER — Other Ambulatory Visit: Payer: Self-pay | Admitting: Psychiatry

## 2019-08-03 ENCOUNTER — Encounter: Payer: Self-pay | Admitting: Internal Medicine

## 2019-08-05 ENCOUNTER — Other Ambulatory Visit: Payer: Self-pay | Admitting: Psychiatry

## 2019-08-05 ENCOUNTER — Ambulatory Visit: Payer: 59 | Admitting: Neurology

## 2019-08-05 ENCOUNTER — Encounter: Payer: Self-pay | Admitting: Neurology

## 2019-08-05 ENCOUNTER — Other Ambulatory Visit: Payer: Self-pay

## 2019-08-05 VITALS — BP 113/66 | HR 72 | Temp 96.7°F | Ht 66.0 in | Wt 136.0 lb

## 2019-08-05 DIAGNOSIS — G43001 Migraine without aura, not intractable, with status migrainosus: Secondary | ICD-10-CM

## 2019-08-05 MED ORDER — NURTEC 75 MG PO TBDP: 75.0000 mg | ORAL_TABLET | Freq: Every day | ORAL | 0 refills | Status: DC | PRN

## 2019-08-05 NOTE — Progress Notes (Signed)
[facility]    Provider:  Dr Jaynee Eagles Referring Provider: Jonathon Jordan, MD Primary Care Physician:  Jonathon Jordan, MD  CC:  Migraine   Interval history 08/05/2019:  She is still doing well on Aimovig. She came off of the nefazodone. She came off the depakote and her migraines are stable but she was having other symptoms such as swallowing problems and maybe withdrawal symptoms, she is on pepcid now, she doesn't want to go back on them. She continues the xanax at bedtime and wants to stop that as well. [name] did not work. She has not had many headaches, she still has meclizine and she has taken that that aspirin and ibuprofen for mild headaches. She has not had any severe headaches this year, mild and responded to the meclizine combo above. Gave her samples of Nurtec.  Interval history 08/01/2018: She went back to [place] because it worked better. Ajovy didn't work, it wore off.  The aimovig gave her constipation but she is working with GI and feels better. Discussed other acute medications, [name] and others. Has had a difficult time with acute management. She feels tremendously improved.  In the past we have tried her on a combination of meds for acute management, she is tried multiple of them Cambia, Zofran, Relpax and other triptans, baclofen, tramadol, Fioricet, zembrace and Zomig and other triptan's.  Today we discussed some of the new medications such as Ubrogrepant and Lasmitidan.  I gave her samples of Ubrelvy today and she will get back to [place].  Interval History 01/29/2018; Tried her on a combination of meds for acute management (cambia, zofran, relpax) was also taking imitrex and baclofen. Tramadol in hte past did not help. Tried Fioricet at last appointment, made her dizzy and made her itch. Discussed trying other medications. Discussed oral medications. She has them worse in the winter, they can last up to 7 days. Discussed trying Zembrace and Zomig nasal acutely and  also starting Aimovig for the next 3 months as she has worsening migraines in winter will provide samples and see how she does.  Interval history 03/12/2017: Tried a combination of medications, cambia, relpax and zofran. Cambia alone did not help. Zofran and relpax did not help when she got home. Took all three together later in the mirgaine didn;t help. But also tried it at onset of headache and did not stop the migraine. She had her last migraine at the end of August until the 17th of September. Then October 24th had another headache and the tylenol worked. Lasted a few days, baclofen for neck pain but did not stop it until the 26th-28th. 63 days has had 41 headache free days. In 2 months had 22 migraine days. Season may also trigger. She has tried imitrex PO. Tramadol shot in the past had not helped, migraine started on 6/27 and imitrex did not help but she had the headache for several days so this was not at onset. Tried cambia.   HPI:  Yolanda Carroll is a 64 y.o. female here as a referral from Dr. Stephanie Acre for migraines. She is currently on Depakote, meloxicam, Imitrex, baclofen. She has a past medical history of osteoporosis, plantar fasciitis, migraine, irritable bowel syndrome, fatigue, neck and back pain with degenerative disc disease and radiculopathy, arthritis. She has had migraines since 2000. Started worsening in October. They can last up to 2 weeks straight. She has associated vertigo. She has done well on nefazodone and depakote. The next headache was in June and lasted  2 weeks. It hurts behind the eyes, she can still function but is moderate in pain, weather triggers, she has light sensitivity, no significant nausea or vomiting. She has had nausea and vomiting in the past. Depakote is working for her. Slowly progressive when they start. No medication overuse. No other focal neurologic deficits, associated symptoms, inciting events or modifiable factors.  Meds tried: Depakote, Imitrex,  baclofen, Nortriptyline, Gabapentin, Nefazodone, Skelaxin, ketaprofen,   Reviewed notes, labs and imaging from outside physicians, which showe:   Personally reviewed imaging and agree with following  MRI cervical spine 07/2013: 1. Mild worsening of the foraminal impingement at C7-T1 due to progressive spondylosis and degenerative disc disease. 2. Mild left foraminal impingement at C3-4, C5-6, and C6-7 primarily due to spurring. Although there is some residual left foraminal impingement at the postoperative levels, the degree of impingement at these levels is much less than on the preoperative exam.  Review of Systems: Patient complains of symptoms per HPI as well as the following symptoms: ringing in ears, anxiety. Pertinent negatives and positives per HPI. All others negative.  Review of Systems: Patient complains of symptoms per HPI as well as the following symptoms:headache. Pertinent negatives and positives per HPI. All others negative.   Social History   Socioeconomic History  . Marital status: Married    Spouse name: Fritz Pickerel  . Number of children: 0  . Years of education: 95  . Highest education level: Not on file  Occupational History    Comment: [employer]  Tobacco Use  . Smoking status: Former Smoker    Quit date: 05/17/1983    Years since quitting: 36.2  . Smokeless tobacco: Never Used  Substance and Sexual Activity  . Alcohol use: Yes    Alcohol/week: 1.0 - 2.0 standard drinks    Types: 1 - 2 Standard drinks or equivalent per week    Comment: socially, occasional  . Drug use: No  . Sexual activity: Yes    Birth control/protection: Post-menopausal    Comment: intercourse age 42 , sexual partners more than 5  Other Topics Concern  . Not on file  Social History Narrative   Pt is married, no children.  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    Caffeine- very little.   Right handed   Lives at home with her husband    Social Determinants of Health   Financial Resource Strain:   . Difficulty of Paying Living Expenses:   Food Insecurity:   . Worried About Charity fundraiser in the Last Year:   . Arboriculturist in the Last Year:   Transportation Needs:   . Film/video editor (Medical):   Marland Kitchen Lack of Transportation (Non-Medical):   Physical Activity:   . Days of Exercise per Week:   . Minutes of Exercise per Session:   Stress:   . Feeling of Stress :   Social Connections:   . Frequency of Communication with Friends and Family:   . Frequency of Social Gatherings with Friends and Family:   . Attends Religious Services:   . Active Member of Clubs or Organizations:   . Attends Archivist Meetings:   Marland Kitchen Marital Status:   Intimate Partner Violence:   . Fear of Current or Ex-Partner:   . Emotionally Abused:   Marland Kitchen Physically Abused:   . Sexually Abused:     Family History  Problem Relation Age of Onset  . Cancer Father  lymphoma  . Other Mother        bipolar,reflux  . Bipolar disorder Mother   . Other Brother        sinus problems  . Cancer Maternal Aunt        uterine cancer  . Breast cancer Maternal Aunt        40's  . Diabetes Maternal Aunt   . Cancer Paternal Aunt        Colon cancer  . Breast cancer Cousin 53       Mat. 1st cousin    Past Medical History:  Diagnosis Date  . Allergy   . Arthritis   . Cervical dysplasia   . Endometriosis   . Fatigue   . GERD (gastroesophageal reflux disease)   . IBS (irritable bowel syndrome)   . Migraine   . Osteoporosis 01/2019   T score -3.1  . Plantar fasciitis   . PONV (postoperative nausea and vomiting)    also difficult to wake up  . Recurrent vaginitis   . Reflux   . Scoliosis   . Wears glasses     Past Surgical History:  Procedure Laterality Date  . ANTERIOR CERVICAL DECOMP/DISCECTOMY FUSION  04/17/2011   Procedure: ANTERIOR CERVICAL DECOMPRESSION/DISCECTOMY FUSION 2 LEVELS;  Surgeon: Hosie Spangle;   Location: [facility];  Service: Neurosurgery;  Laterality: N/A;  Cervical five-six, six-seven anterior cervical decompression with fusion,  plating,  and bonegraft   . APPENDECTOMY  1978  . BREAST BIOPSY  07/13/2011   Procedure: BREAST BIOPSY WITH NEEDLE LOCALIZATION;  Surgeon: Rolm Bookbinder, MD;  Location: [facility];  Service: General;  Laterality: Right;  Right breast wire localization biopsy  . BREAST EXCISIONAL BIOPSY Right 2013  . BREAST SURGERY     Breast Bx-Benign  . CYSTOSCOPY    . GYNECOLOGIC CRYOSURGERY    . HERNIA REPAIR  08/02/1995   RIH  . LAPAROSCOPIC ENDOMETRIOSIS FULGURATION  1997  . PELVIC LAPAROSCOPY    . ROTATOR CUFF REPAIR     right 2002 left 2000    Current Outpatient Medications  Medication Sig Dispense Refill  . AIMOVIG 140 MG/ML SOAJ Inject 140 mg into the skin every 30 (thirty) days. 3 pen 1  . ALPRAZolam (XANAX) 0.25 MG tablet TAKE ONE TABLET BY MOUTH TWICE A DAY 60 tablet 5  . Ascorbic Acid (VITAMIN C PO) Take 1 tablet by mouth daily. 500mg  capsule    . baclofen (LIORESAL) 10 MG tablet 10 mg as needed.    Marland Kitchen CALCIUM CITRATE PO Take by mouth.    . Cholecalciferol (VITAMIN D PO) Take 800 Units by mouth daily.     Marland Kitchen conjugated estrogens (PREMARIN) vaginal cream Place 1 Applicatorful vaginally 2 (two) times a week. 42.5 g 3  . denosumab (PROLIA) 60 MG/ML SOSY injection Inject 60 mg into the skin every 6 (six) months.    Marland Kitchen FAMOTIDINE PO Take 40 mg by mouth daily.    . Gabapentin (NEURONTIN PO) Take 100 mg by mouth daily.     Marland Kitchen L-LYSINE PO Take 1 tablet by mouth daily.     Marland Kitchen lidocaine (LIDODERM) 5 % Place onto the skin as needed. Back pain    . magnesium 30 MG tablet Take 30 mg by mouth every other day.     . meclizine (ANTIVERT) 25 MG tablet Take 25 mg by mouth 3 (three) times daily as needed for dizziness.    . meloxicam (MOBIC) 7.5 MG tablet Take 7.5 mg  by mouth daily.     . Multiple Vitamin (MULTIVITAMIN) tablet Take 1 tablet by mouth  daily.    . pantoprazole (PROTONIX) 40 MG tablet Take 40 mg by mouth 2 (two) times daily.    . predniSONE (STERAPRED UNI-PAK 21 TAB) 5 MG (21) TBPK tablet 6 day course.    . valACYclovir (VALTREX) 500 MG tablet Take 500 mg by mouth 2 (two) times daily as needed.    . vitamin E 400 UNIT capsule Take 200 Units by mouth daily.     . Rimegepant Sulfate (NURTEC) 75 MG TBDP Take 75 mg by mouth daily as needed. For migraines. Take as close to onset of migraine as possible. One daily maximum. 4 tablet 0   No current facility-administered medications for this visit.    Allergies as of 08/05/2019 - Review Complete 08/05/2019  Allergen Reaction Noted  . Penicillins Other (See Comments) 11/30/2010  . Atarax [hydroxyzine] Other (See Comments) 01/08/2017  . Celexa [citalopram hydrobromide] Other (See Comments) 02/21/2012  . Erythromycin  01/05/2017  . Flagyl [metronidazole]  10/22/2017  . Nortriptyline Itching 04/17/2011  . Prilosec [omeprazole]  01/05/2017  . Requip [ropinirole]  01/05/2017  . Tramadol Itching 04/05/2011  . Darvocet [propoxyphene n-acetaminophen] Itching 11/30/2010  . Percocet [oxycodone-acetaminophen] Itching 11/30/2010    Vitals: BP 113/66 (BP Location: Left Arm, Patient Position: Sitting)   Pulse 72   Temp (!) 96.7 F (35.9 C) Comment: taken at front  Ht 5\' 6"  (1.676 m)   Wt 136 lb (61.7 kg)   BMI 21.95 kg/m  Last Weight:  Wt Readings from Last 1 Encounters:  08/05/19 136 lb (61.7 kg)   Last Height:   Ht Readings from Last 1 Encounters:  08/05/19 5\' 6"  (1.676 m)   Physical exam: Exam: Gen: NAD, conversant, well nourised, obese, well groomed                     CV: RRR, no MRG. No Carotid Bruits. No peripheral edema, warm, nontender Eyes: Conjunctivae clear without exudates or hemorrhage  Neuro: Detailed Neurologic Exam  Speech:    Speech is normal; fluent and spontaneous with normal comprehension.  Cognition:    The patient is oriented to person, place,  and time;     recent and remote memory intact;     language fluent;     normal attention, concentration,     fund of knowledge Cranial Nerves:    The pupils are equal, round, and reactive to light. The fundi are normal and spontaneous venous pulsations are present. Visual fields are full to finger confrontation. Extraocular movements are intact. Trigeminal sensation is intact and the muscles of mastication are normal. The face is symmetric. The palate elevates in the midline. Hearing intact. Voice is normal. Shoulder shrug is normal. The tongue has normal motion without fasciculations.   Coordination:    Normal finger to nose and heel to shin. Normal rapid alternating movements.   Gait:    Heel-toe and tandem gait are normal.   Motor Observation:    No asymmetry, no atrophy, and no involuntary movements noted. Tone:    Normal muscle tone.    Posture:    Posture is normal. normal erect    Strength:    Strength is V/V in the upper and lower limbs.      Sensation: intact to LT     Reflex Exam:  DTR's:    Deep tendon reflexes in the upper and lower extremities are normal bilaterally.  Toes:    The toes are downgoing bilaterally.   Clonus:    Clonus is absent.    Assessment/Plan:  Patient with migraines, has failed multiple acute medications.   Acute medications Tried: ubrelvy, meclizine, relpax, imitrex, ibuprofen, tylenol, cambia, zofran,Cambia, Zofran, Relpax and other triptans, baclofen, tramadol, Fioricet, and brace and Zomig and other triptan's., has tried these in combination, fioricet(side effects), meloxicam, magnesium, meclizine and many others.  We will try the new medication Nurtec for acute management. Gave her samples patient literature from [employer] on medication.   Aimovig gave her constipation but Ajovy did not work and she went back to the [employer].  She feels her constipation is much improved and likes the Aimovig and will stay with this.  A total of 25  minutes was spent face-to-face with this patient. Over half this time was spent on counseling patient on the  1. Migraine without aura and with status migrainosus, not intractable    diagnosis and different diagnostic and therapeutic options, counseling and coordination of care, risks ans benefits of management, compliance, or risk factor reduction and education.     Recommend MRI brain, she would like to wait  Meds ordered this encounter  Medications  . Rimegepant Sulfate (NURTEC) 75 MG TBDP    Sig: Take 75 mg by mouth daily as needed. For migraines. Take as close to onset of migraine as possible. One daily maximum.    Dispense:  4 tablet    Refill:  0     Discussed :To prevent or relieve headaches, try the following: Cool Compress. Lie down and place a cool compress on your head.  Avoid headache triggers. If certain foods or odors seem to have triggered your migraines in the past, avoid them. A headache diary might help you identify triggers.  Include physical activity in your daily routine. Try a daily walk or other moderate aerobic exercise.  Manage stress. Find healthy ways to cope with the stressors, such as delegating tasks on your to-do list.  Practice relaxation techniques. Try deep breathing, yoga, massage and visualization.  Eat regularly. Eating regularly scheduled meals and maintaining a healthy diet might help prevent headaches. Also, drink plenty of fluids.  Follow a regular sleep schedule. Sleep deprivation might contribute to headaches Consider biofeedback. With this mind-body technique, you learn to control certain bodily functions -- such as muscle tension, heart rate and blood pressure -- to prevent headaches or reduce headache pain.    Proceed to emergency room if you experience new or worsening symptoms or symptoms do not resolve, if you have new neurologic symptoms or if headache is severe, or for any concerning symptom.   Provided education and documentation from  American headache Society toolbox including articles on: chronic migraine medication overuse headache, chronic migraines, prevention of migraines, behavioral and other nonpharmacologic treatments for headache.    Sarina Ill, MD  [facility] [address] [place], [place] [address]  Phone [phone] Fax [phone]  A total of 25 minutes was spent face-to-face with this patient. Over half this time was spent on counseling patient on the  1. Migraine without aura and with status migrainosus, not intractable    and different diagnostic and therapeutic options available.

## 2019-08-05 NOTE — Telephone Encounter (Signed)
I do not believe it is available unless the pharmacy has some stocked away.  It is no longer being manufactured.  The patient was weaning off of it but I will go ahead and refill it in case she needs more to try to wean off and can find it.

## 2019-08-05 NOTE — Telephone Encounter (Signed)
Not sure this is still available?

## 2019-08-11 ENCOUNTER — Telehealth: Payer: Self-pay | Admitting: Psychiatry

## 2019-08-11 NOTE — Telephone Encounter (Signed)
Pt would like to speak with nurse to go over some medications. Please reach out to pt at [phone].

## 2019-08-11 NOTE — Telephone Encounter (Signed)
I have never heard of Depakote helping with swallowing issues so that is new information to me. My only thought is that if she wants to still try to go off of Depakote is for her to take the lower dose of Depakote ER 250 mg tablets 1 daily for a couple of months and then try stopping again that might work.  The other side of the coin of Depakote helping with swallowing issues is that if it is stopped too quickly for any given individual that it could possibly trigger swallowing issues so by going slower in the process off of the medication it may resolve that problem.  Just have her let us know what she wants to do.

## 2019-08-11 NOTE — Telephone Encounter (Signed)
Pt left message stating returning Traci's call. Call back @ [phone]

## 2019-08-11 NOTE — Telephone Encounter (Signed)
Left patient message to call back.

## 2019-08-19 ENCOUNTER — Encounter: Payer: Self-pay | Admitting: Psychiatry

## 2019-08-19 ENCOUNTER — Other Ambulatory Visit: Payer: Self-pay

## 2019-08-19 ENCOUNTER — Ambulatory Visit (INDEPENDENT_AMBULATORY_CARE_PROVIDER_SITE_OTHER): Payer: 59 | Admitting: Psychiatry

## 2019-08-19 DIAGNOSIS — F411 Generalized anxiety disorder: Secondary | ICD-10-CM | POA: Diagnosis not present

## 2019-08-19 DIAGNOSIS — K219 Gastro-esophageal reflux disease without esophagitis: Secondary | ICD-10-CM

## 2019-08-19 DIAGNOSIS — G43009 Migraine without aura, not intractable, without status migrainosus: Secondary | ICD-10-CM

## 2019-08-19 DIAGNOSIS — F5105 Insomnia due to other mental disorder: Secondary | ICD-10-CM

## 2019-08-19 NOTE — Progress Notes (Signed)
Yolanda Carroll [id number] 1956/03/31 64 y.o.    Subjective:   Patient ID:  Yolanda Carroll is a 64 y.o. (DOB May 23, 1955) female.  Chief Complaint:  Chief Complaint  Patient presents with  . Follow-up    med changes    Anxiety Patient reports no confusion, decreased concentration, nervous/anxious behavior or suicidal ideas.     Yolanda Carroll presents to the office today for follow-up of anxiety and migraine.  seen in July 2020.  No meds were changed.  She requires name brand Depakote because the generic caused worsening headaches.  Retired November 14, 2018.  Was so burned out at work.  Helping with church projects and helping friends.  Focusing on her health and doing PT for back and hip.  No kids or gkids.  Helps care for 30 yo M-in-law.  H is also busy which helps.  Given a surprise retirement party.     Last seen January 2021.  Nefazodone is no longer manufactured and she had to wean off.  We also discussed the potential of weaning off Depakote because Aimovig had been very effective at managing her migraine headache.  She called August 11, 2019 stating that she did taper off of Depakote because she did not feel like she needed it any more.  Her last dose was July 23, 2019 but afterwards noticed heartburn and swallowing issues.  Her GI doctor added Pepcid 40 mg daily.  Her neurologist had indicated that Depakote can sometimes help with esophageal issues and she wondered if that was connected.  She was given the option to restart a lower dose and perhaps taper more slowly.  Off Depakote for a month.  She elected not to restart it.  Tinnitus and mild anxiety worse off the Depakote and with some mind racing esp in the AM.   Most is better. Reflux is worse off Depakote ER 250 and wondering if she should restart it.    Disc article on Depakote helping GERD by increasing lower esophageal sphincter tone.  Has travelled some with family events.  M-in-law 57 soon.    HA good with  Aimovig.  Don't do season changes well and more HA in Spring often.  Sleep not great lately with leg cramps.  Started more exercise.  Started snoring and H is a light sleeper.  Meds help.  Patient reports stable mood and denies depressed or irritable moods.  Patient has mild recent difficulty with anxiety.  Patient denies difficulty with sleep initiation with Xanax.. Denies appetite disturbance.  Patient reports that energy and motivation have been good.  Patient denies any difficulty with concentration.  Patient denies any suicidal ideation.  Insomnia managed with  With alprazolam.  Now not good time to taper.  No sig anxiety daytime usually.   H running for office.    Saw neurologist for Migraine and had problems with the meds RX.  Aimovig has helped..    Past Psychiatric Medication Trials: Failed multiple other antidepressants, nefazodone & Depakote for migraine,  citalopram palpitations ? Ever taken trazodone  Review of Systems:  Review of Systems  Musculoskeletal: Positive for arthralgias.  Neurological: Negative for tremors, weakness and headaches.  Psychiatric/Behavioral: Negative for agitation, behavioral problems, confusion, decreased concentration, dysphoric mood, hallucinations, self-injury, sleep disturbance and suicidal ideas. The patient is not nervous/anxious and is not hyperactive.     Medications: I have reviewed the patient's current medications.  Current Outpatient Medications  Medication Sig Dispense Refill  . AIMOVIG 140 MG/ML SOAJ Inject  140 mg into the skin every 30 (thirty) days. 3 pen 1  . ALPRAZolam (XANAX) 0.25 MG tablet TAKE ONE TABLET BY MOUTH TWICE A DAY 60 tablet 5  . Ascorbic Acid (VITAMIN C PO) Take 1 tablet by mouth daily. 500mg  capsule    . baclofen (LIORESAL) 10 MG tablet 10 mg as needed.    Marland Kitchen CALCIUM CITRATE PO Take by mouth.    . Cholecalciferol (VITAMIN D PO) Take 800 Units by mouth daily.     Marland Kitchen conjugated estrogens (PREMARIN) vaginal cream  Place 1 Applicatorful vaginally 2 (two) times a week. 42.5 g 3  . denosumab (PROLIA) 60 MG/ML SOSY injection Inject 60 mg into the skin every 6 (six) months.    Marland Kitchen FAMOTIDINE PO Take 40 mg by mouth daily.    . Gabapentin (NEURONTIN PO) Take 100 mg by mouth daily.     Marland Kitchen L-LYSINE PO Take 1 tablet by mouth daily.     Marland Kitchen lidocaine (LIDODERM) 5 % Place onto the skin as needed. Back pain    . magnesium 30 MG tablet Take 30 mg by mouth every other day.     . meclizine (ANTIVERT) 25 MG tablet Take 25 mg by mouth 3 (three) times daily as needed for dizziness.    . meloxicam (MOBIC) 7.5 MG tablet Take 7.5 mg by mouth daily.     . Multiple Vitamin (MULTIVITAMIN) tablet Take 1 tablet by mouth daily.    . pantoprazole (PROTONIX) 40 MG tablet Take 40 mg by mouth 2 (two) times daily.    . predniSONE (STERAPRED UNI-PAK 21 TAB) 5 MG (21) TBPK tablet 6 day course.    . Rimegepant Sulfate (NURTEC) 75 MG TBDP Take 75 mg by mouth daily as needed. For migraines. Take as close to onset of migraine as possible. One daily maximum. 4 tablet 0  . valACYclovir (VALTREX) 500 MG tablet Take 500 mg by mouth 2 (two) times daily as needed.    . vitamin E 400 UNIT capsule Take 200 Units by mouth daily.      No current facility-administered medications for this visit.    Medication Side Effects: None  Allergies:  Allergies  Allergen Reactions  . Penicillins Other (See Comments)    Seizure as a child  . Atarax [Hydroxyzine] Other (See Comments)    Headache, depression  . Celexa [Citalopram Hydrobromide] Other (See Comments)    Chest pain  . Erythromycin      Upset stomache  . Flagyl [Metronidazole]     Dizzy and increased heart rate  . Nortriptyline Itching  . Prilosec [Omeprazole]     Abdominal pain  . Requip [Ropinirole]     Made sx worse  . Tramadol Itching  . Darvocet [Propoxyphene N-Acetaminophen] Itching  . Percocet [Oxycodone-Acetaminophen] Itching    Past Medical History:  Diagnosis Date  . Allergy    . Arthritis   . Cervical dysplasia   . Endometriosis   . Fatigue   . GERD (gastroesophageal reflux disease)   . IBS (irritable bowel syndrome)   . Migraine   . Osteoporosis 01/2019   T score -3.1  . Plantar fasciitis   . PONV (postoperative nausea and vomiting)    also difficult to wake up  . Recurrent vaginitis   . Reflux   . Scoliosis   . Wears glasses     Family History  Problem Relation Age of Onset  . Cancer Father        lymphoma  . Other Mother  bipolar,reflux  . Bipolar disorder Mother   . Other Brother        sinus problems  . Cancer Maternal Aunt        uterine cancer  . Breast cancer Maternal Aunt        40's  . Diabetes Maternal Aunt   . Cancer Paternal Aunt        Colon cancer  . Breast cancer Cousin 70       Mat. 1st cousin    Social History   Socioeconomic History  . Marital status: Married    Spouse name: Yolanda Carroll  . Number of children: 0  . Years of education: 21  . Highest education level: Not on file  Occupational History    Comment: [employer]  Tobacco Use  . Smoking status: Former Smoker    Quit date: 05/17/1983    Years since quitting: 36.2  . Smokeless tobacco: Never Used  Substance and Sexual Activity  . Alcohol use: Yes    Alcohol/week: 1.0 - 2.0 standard drinks    Types: 1 - 2 Standard drinks or equivalent per week    Comment: socially, occasional  . Drug use: No  . Sexual activity: Yes    Birth control/protection: Post-menopausal    Comment: intercourse age 20 , sexual partners more than 5  Other Topics Concern  . Not on file  Social History Narrative   Pt is married, no children.  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    Caffeine- very little.   Right handed   Lives at home with her husband   Social Determinants of Health   Financial Resource Strain:   . Difficulty of Paying Living Expenses:   Food Insecurity:   . Worried About Charity fundraiser in the Last Year:   . Arts development officer in the Last Year:   Transportation Needs:   . Film/video editor (Medical):   Marland Kitchen Lack of Transportation (Non-Medical):   Physical Activity:   . Days of Exercise per Week:   . Minutes of Exercise per Session:   Stress:   . Feeling of Stress :   Social Connections:   . Frequency of Communication with Friends and Family:   . Frequency of Social Gatherings with Friends and Family:   . Attends Religious Services:   . Active Member of Clubs or Organizations:   . Attends Archivist Meetings:   Marland Kitchen Marital Status:   Intimate Partner Violence:   . Fear of Current or Ex-Partner:   . Emotionally Abused:   Marland Kitchen Physically Abused:   . Sexually Abused:     Past Medical History, Surgical history, Social history, and Family history were reviewed and updated as appropriate.   Please see review of systems for further details on the patient's review from today.   Objective:   Physical Exam:  There were no vitals taken for this visit.  Physical Exam Neurological:     Mental Status: She is alert and oriented to person, place, and time.     Cranial Nerves: No dysarthria.  Psychiatric:        Attention and Perception: Attention and perception normal.        Mood and Affect: Mood normal.        Speech: Speech normal.        Behavior: Behavior is cooperative.        Thought Content: Thought content normal. Thought content is not paranoid or delusional.  Thought content does not include homicidal or suicidal ideation. Thought content does not include homicidal or suicidal plan.        Cognition and Memory: Cognition and memory normal.        Judgment: Judgment normal.     Comments: Insight intact     Lab Review:     Component Value Date/Time   NA 137 03/04/2019 0949   K 4.2 03/04/2019 0949   CL 100 03/04/2019 0949   CO2 31 03/04/2019 0949   GLUCOSE 80 03/04/2019 0949   BUN 13 03/04/2019 0949   CREATININE 0.83 03/04/2019 0949   CALCIUM 9.5 03/04/2019 0949   PROT 7.6  02/21/2012 1746   ALBUMIN 4.2 02/21/2012 1746   AST 18 02/21/2012 1746   ALT 12 02/21/2012 1746   ALKPHOS 36 (L) 02/21/2012 1746   BILITOT 0.3 02/21/2012 1746   GFRNONAA >90 02/21/2012 1746   GFRAA >90 02/21/2012 1746       Component Value Date/Time   WBC 5.4 02/21/2012 1746   RBC 4.35 02/21/2012 1746   HGB 13.3 02/21/2012 1746   HCT 39.6 02/21/2012 1746   PLT 213 02/21/2012 1746   MCV 91.0 02/21/2012 1746   MCH 30.6 02/21/2012 1746   MCHC 33.6 02/21/2012 1746   RDW 12.8 02/21/2012 1746   LYMPHSABS 2.5 02/21/2012 1746   MONOABS 0.7 02/21/2012 1746   EOSABS 0.1 02/21/2012 1746   BASOSABS 0.0 02/21/2012 1746    Normal liver enzymes in September.  No results found for: POCLITH, LITHIUM   No results found for: PHENYTOIN, PHENOBARB, VALPROATE, CBMZ   .res Assessment: Plan:    Aiyona was seen today for follow-up.  Diagnoses and all orders for this visit:  Generalized anxiety disorder  Insomnia due to mental condition  Migraine without aura and without status migrainosus, not intractable  Gastroesophageal reflux disease without esophagitis  Greater than 50% of 30 min non face to face time with patient was spent on counseling and coordination of care. We discussed the following.  We discussed the short-term risks associated with benzodiazepines including sedation and increased fall risk among others.  Discussed long-term side effect risk including dependence, potential withdrawal symptoms, and the potential eventual dose-related risk of dementia. Disc newer studies that cast in doubt the relationship with dementia. Taper Xanax by 1/4 of 0.25 mg daily per month if she chooses and when she chooses to do so.   Disc pros/cons restarting Depakote for GERD and seemed to help tinnitus. Rec discuss with GI doctor. Answered her questions about Depakote and liver toxicity risks.  Her liver enzymes have been very normal and she is at low risk of developing liver problems at this  point because she has tolerated it well for an extended period of time.  Discussed the 2 most attractive lowest dose options would be the Depakote ER 250 mg 1 daily or if she felt she could get by with the 12-hour version she could try regular Depakote 125 daily.  She will discuss it with her GI doctor and decide.  No problems off the nefazodone.  This was a 30-minute appointment  FU 6 mos  Lynder Parents, MD, DFAPA  Please see After Visit Summary for patient specific instructions.  Future Appointments  Date Time Provider [place]  09/08/2019  3:30 PM Joseph Pierini, MD [facility]  12/04/2019  9:00 AM Philemon Kingdom, MD [facility] None    No orders of the defined types were placed in this encounter.     -------------------------------

## 2019-08-29 ENCOUNTER — Other Ambulatory Visit: Payer: Self-pay | Admitting: Neurology

## 2019-09-05 ENCOUNTER — Other Ambulatory Visit: Payer: Self-pay

## 2019-09-07 ENCOUNTER — Other Ambulatory Visit: Payer: Self-pay | Admitting: Neurology

## 2019-09-08 ENCOUNTER — Encounter: Payer: Self-pay | Admitting: Obstetrics and Gynecology

## 2019-09-08 ENCOUNTER — Ambulatory Visit (INDEPENDENT_AMBULATORY_CARE_PROVIDER_SITE_OTHER): Payer: 59 | Admitting: Obstetrics and Gynecology

## 2019-09-08 ENCOUNTER — Other Ambulatory Visit: Payer: Self-pay

## 2019-09-08 VITALS — BP 120/76 | Ht 66.0 in | Wt 133.0 lb

## 2019-09-08 DIAGNOSIS — N898 Other specified noninflammatory disorders of vagina: Secondary | ICD-10-CM | POA: Diagnosis not present

## 2019-09-08 DIAGNOSIS — B373 Candidiasis of vulva and vagina: Secondary | ICD-10-CM | POA: Diagnosis not present

## 2019-09-08 DIAGNOSIS — B3731 Acute candidiasis of vulva and vagina: Secondary | ICD-10-CM

## 2019-09-08 DIAGNOSIS — N952 Postmenopausal atrophic vaginitis: Secondary | ICD-10-CM

## 2019-09-08 DIAGNOSIS — M810 Age-related osteoporosis without current pathological fracture: Secondary | ICD-10-CM

## 2019-09-08 DIAGNOSIS — Z79890 Hormone replacement therapy (postmenopausal): Secondary | ICD-10-CM | POA: Diagnosis not present

## 2019-09-08 DIAGNOSIS — Z01419 Encounter for gynecological examination (general) (routine) without abnormal findings: Secondary | ICD-10-CM

## 2019-09-08 LAB — WET PREP FOR TRICH, YEAST, CLUE

## 2019-09-08 MED ORDER — TERCONAZOLE 0.4 % VA CREA: 1.0000 | TOPICAL_CREAM | Freq: Every day | VAGINAL | 0 refills | Status: DC

## 2019-09-08 MED ORDER — PREMARIN 0.625 MG/GM VA CREA: 1.0000 | TOPICAL_CREAM | VAGINAL | 5 refills | Status: DC

## 2019-09-08 NOTE — Progress Notes (Signed)
Yolanda Carroll November 10, 1955 [id number]  SUBJECTIVE:  64 y.o. G0P0 female for annual routine gynecologic exam and Pap smear. Some vaginal itching. Still having vaginal dryness but finds Premarin and lubricants are helpful a little.  Current Outpatient Medications  Medication Sig Dispense Refill   AIMOVIG 140 MG/ML SOAJ INJECT 140 MG INTO SKIN EVERY 30 DAYS 3 pen 3   ALPRAZolam (XANAX) 0.25 MG tablet TAKE ONE TABLET BY MOUTH TWICE A DAY 60 tablet 5   Ascorbic Acid (VITAMIN C PO) Take 1 tablet by mouth daily. 500mg  capsule     baclofen (LIORESAL) 10 MG tablet 10 mg as needed.     CALCIUM CITRATE PO Take by mouth.     Cholecalciferol (VITAMIN D PO) Take 800 Units by mouth daily.      conjugated estrogens (PREMARIN) vaginal cream Place 1 Applicatorful vaginally 2 (two) times a week. 42.5 g 3   denosumab (PROLIA) 60 MG/ML SOSY injection Inject 60 mg into the skin every 6 (six) months.     divalproex (DEPAKOTE) 250 MG DR tablet Take 250 mg by mouth daily.     FAMOTIDINE PO Take 40 mg by mouth daily.     Gabapentin (NEURONTIN PO) Take 100 mg by mouth daily.      L-LYSINE PO Take 1 tablet by mouth daily.      lidocaine (LIDODERM) 5 % Place onto the skin as needed. Back pain     magnesium 30 MG tablet Take 30 mg by mouth every other day.      meclizine (ANTIVERT) 25 MG tablet Take 25 mg by mouth 3 (three) times daily as needed for dizziness.     meloxicam (MOBIC) 7.5 MG tablet Take 7.5 mg by mouth daily.      Multiple Vitamin (MULTIVITAMIN) tablet Take 1 tablet by mouth daily.     pantoprazole (PROTONIX) 40 MG tablet Take 40 mg by mouth 2 (two) times daily.     predniSONE (STERAPRED UNI-PAK 21 TAB) 5 MG (21) TBPK tablet 6 day course.     valACYclovir (VALTREX) 500 MG tablet Take 500 mg by mouth 2 (two) times daily as needed.     vitamin E 400 UNIT capsule Take 200 Units by mouth daily.      Rimegepant Sulfate (NURTEC) 75 MG TBDP Take 75 mg by mouth daily as needed. For  migraines. Take as close to onset of migraine as possible. One daily maximum. (Patient not taking: Reported on 09/08/2019) 4 tablet 0   No current facility-administered medications for this visit.   Allergies: Penicillins, Atarax [hydroxyzine], Celexa [citalopram hydrobromide], Erythromycin, Flagyl [metronidazole], Nortriptyline, Prilosec [omeprazole], Requip [ropinirole], Tramadol, Darvocet [propoxyphene n-acetaminophen], and Percocet [oxycodone-acetaminophen]  No LMP recorded. Patient is postmenopausal.  Past medical history,surgical history, problem list, medications, allergies, family history and social history were all reviewed and documented as reviewed in the EPIC chart.  ROS:  Feeling well. No dyspnea or chest pain on exertion.  No abdominal pain, change in bowel habits, black or bloody stools.  No urinary tract symptoms. GYN ROS: no abnormal bleeding, pelvic pain or discharge, no breast pain or new or enlarging lumps on self exam. No neurological complaints.   OBJECTIVE:  BP 120/76    Ht 5\' 6"  (1.676 m)    Wt 133 lb (60.3 kg)    BMI 21.47 kg/m  The patient appears well, alert, oriented x 3, in no distress. ENT normal.  Neck supple. No cervical or supraclavicular adenopathy or thyromegaly.  Lungs are clear, good  air entry, no wheezes, rhonchi or rales. S1 and S2 normal, no murmurs, regular rate and rhythm.  Abdomen soft without tenderness, guarding, mass or organomegaly.  Neurological is normal, no focal findings.  BREAST EXAM: breasts appear normal, no suspicious masses, no skin or nipple changes or axillary nodes  PELVIC EXAM: VULVA: normal appearing vulva with no masses, tenderness or lesions, VAGINA: normal appearing vagina with normal color, small amount of cottage cheese-like discharge, no lesions, CERVIX: normal appearing cervix without discharge or lesions, UTERUS: uterus is normal size, shape, consistency and nontender, ADNEXA: normal adnexa in size, nontender and no masses,  WET MOUNT done - results: hyphae, co clue cells/trichomonas, few WBC, many bacteria, 7-12 epithelial cells/hpf  Chaperone: [name] present during the examination  ASSESSMENT:  64 y.o. G0P0 here for annual gynecologic exam  PLAN:   1. Postmenopausal.  Vaginal dryness.  Continue Premarin 1 g 2-3 times weekly as increased frequency seems to be helping a little more.  Using lubricants.  Risks of systemic absorption do include increased risk for thrombotic diseases, endometrial cancer, breast cancer.  She accepts the risks and Premarin refills provided to her pharmacy.  She had used formulated vaginal estradiol cream, Intrarosa, Vagifem in the past and still having the same issues.  Remaining options would include Estring, and also the vaginal rejuvenation therapies that have limited efficacy data. 2. Pap smear 08/2017.  No significant history of abnormal Pap smears.  Next Pap smear due 2022 following the current guidelines recommending the 3 year interval. 3. Mammogram 11/2018.  Normal breast exam today.  She is reminded to schedule an annual mammogram this year when due. 4. Colonoscopy 2016.  Recommended that she follow up at the recommended interval.  5.  Vaginal itching and discharge.  Likely consistent with yeast infection, terconazole 7 night worked well for her in the past.  Rx is sent to her pharmacy. 6. DEXA 01/2019.  T score -3.1 at right femoral neck.  Seen by Dr. Cruzita Lederer for consult.  She was started on Prolia 04/2019.  She will plan to return to Dr. Cruzita Lederer this year for bone health follow-up.  Next DEXA recommended at the 2 year interval in 2022. 7. Health maintenance.  No labs today as she normally has these completed with her primary care provider.    Return annually or sooner, prn.  Joseph Pierini MD 09/08/19

## 2019-09-23 ENCOUNTER — Other Ambulatory Visit: Payer: Self-pay | Admitting: Orthopaedic Surgery

## 2019-09-23 DIAGNOSIS — M5126 Other intervertebral disc displacement, lumbar region: Secondary | ICD-10-CM

## 2019-09-29 ENCOUNTER — Telehealth: Payer: Self-pay

## 2019-09-29 NOTE — Telephone Encounter (Signed)
Patient has been submitted to the [place] portal to get summary of benefits.

## 2019-09-30 ENCOUNTER — Ambulatory Visit
Admission: RE | Admit: 2019-09-30 | Discharge: 2019-09-30 | Disposition: A | Discharge status: Home / Self Care | Payer: 59 | Source: Ambulatory Visit | Attending: Orthopaedic Surgery | Admitting: Orthopaedic Surgery

## 2019-09-30 ENCOUNTER — Other Ambulatory Visit: Payer: Self-pay

## 2019-09-30 DIAGNOSIS — M5126 Other intervertebral disc displacement, lumbar region: Secondary | ICD-10-CM

## 2019-10-03 ENCOUNTER — Telehealth: Payer: Self-pay | Admitting: Psychiatry

## 2019-10-03 NOTE — Telephone Encounter (Signed)
Okay to restart nefazodone.  She had 150 mg tablets and was taking 1 daily.  As far as I am aware it will not be possible to buy any more of it.  Therefore when she starts to run out she needs to have at least 2 weeks of supply to take half tablet daily before she completely stops it.

## 2019-10-03 NOTE — Telephone Encounter (Signed)
Yolanda Carroll called requesting to restart the Nefazodone and take maybe just a couple of months.  She felt it helped with some back pain she is experiencing.  She has enough left over that she can take so she doesn't need a prescription.  She just wants your ok to do this.  She made appt. For 6/23 to review meds, etc   Please call if ok to restart the Nefazodone

## 2019-10-15 NOTE — Telephone Encounter (Signed)
[employer] was missing information regarding pt's insurance. Pt has now been resubmitted for summary of benefits.

## 2019-10-16 ENCOUNTER — Other Ambulatory Visit: Payer: Self-pay | Admitting: Obstetrics and Gynecology

## 2019-10-16 DIAGNOSIS — Z1231 Encounter for screening mammogram for malignant neoplasm of breast: Secondary | ICD-10-CM

## 2019-10-16 NOTE — Telephone Encounter (Signed)
Received additional information from [place] and it states that the pt's insurance plan advised that the provider must use an online website for this verification, and this office must contact they payer directly at [phone] to obtain benefits and prior authorization requirements for the patient. This will be done.

## 2019-10-17 ENCOUNTER — Ambulatory Visit: Payer: Self-pay | Admitting: Internal Medicine

## 2019-10-22 ENCOUNTER — Other Ambulatory Visit: Payer: 59

## 2019-10-27 ENCOUNTER — Encounter: Payer: Self-pay | Admitting: Internal Medicine

## 2019-10-27 NOTE — Telephone Encounter (Signed)
Baker Janus from [place] called to give benefit information about Prolia-she stated the co-pay for the patient is $60 and as long as it is FDA approved and under $5000 it will be covered without a PA-I requested she fax the EOB to [place] to scan in the patients chart-FYI

## 2019-10-27 NOTE — Telephone Encounter (Signed)
Attempted to contact pt's insurance company and verify coverage for Prolia. I was on hold for 42 minutes, and no one had picked up as of yet. I did have to hang up, but will try again at a later time.

## 2019-10-27 NOTE — Telephone Encounter (Signed)
Called pt and informed her of this. Pt did state that she would contact her insurance and see if she could get any information from them as well. Pt will give me a call back when/if she gets in touch with her insurance.

## 2019-11-05 ENCOUNTER — Other Ambulatory Visit: Payer: Self-pay

## 2019-11-05 ENCOUNTER — Other Ambulatory Visit: Payer: Self-pay | Admitting: Orthopedic Surgery

## 2019-11-05 ENCOUNTER — Encounter: Payer: Self-pay | Admitting: Psychiatry

## 2019-11-05 ENCOUNTER — Ambulatory Visit (INDEPENDENT_AMBULATORY_CARE_PROVIDER_SITE_OTHER): Payer: 59 | Admitting: Psychiatry

## 2019-11-05 DIAGNOSIS — F411 Generalized anxiety disorder: Secondary | ICD-10-CM

## 2019-11-05 DIAGNOSIS — F5105 Insomnia due to other mental disorder: Secondary | ICD-10-CM | POA: Diagnosis not present

## 2019-11-05 DIAGNOSIS — G43009 Migraine without aura, not intractable, without status migrainosus: Secondary | ICD-10-CM

## 2019-11-05 DIAGNOSIS — K219 Gastro-esophageal reflux disease without esophagitis: Secondary | ICD-10-CM

## 2019-11-05 DIAGNOSIS — G8929 Other chronic pain: Secondary | ICD-10-CM

## 2019-11-05 DIAGNOSIS — M545 Low back pain, unspecified: Secondary | ICD-10-CM

## 2019-11-05 NOTE — Progress Notes (Signed)
Yolanda Carroll [id number] 1955/11/11 64 y.o.    Subjective:   Patient ID:  Yolanda Carroll is a 64 y.o. (DOB [birth year]) female.  Chief Complaint:  Chief Complaint  Patient presents with  . Follow-up  . Medication Problem    nefazodone shortage  . Stress    health    Anxiety Patient reports no confusion, decreased concentration, nervous/anxious behavior or suicidal ideas.     Yolanda Carroll presents to the office today for follow-up of anxiety and migraine.  seen in July 2020.  No meds were changed.  She requires name brand Depakote because the generic caused worsening headaches.  Retired November 14, 2018.  Was so burned out at work.  Helping with church projects and helping friends.  Focusing on her health and doing PT for back and hip.  No kids or gkids.  Helps care for 85 yo M-in-law.  H is also busy which helps.  Given a surprise retirement party.     Last seen January 2021.  Nefazodone is no longer manufactured and she had to wean off.  We also discussed the potential of weaning off Depakote because Aimovig had been very effective at managing her migraine headache.  She called August 11, 2019 stating that she did taper off of Depakote because she did not feel like she needed it any more.  Her last dose was July 23, 2019 but afterwards noticed heartburn and swallowing issues.  Her GI doctor added Pepcid 40 mg daily.  Her neurologist had indicated that Depakote can sometimes help with esophageal issues and she wondered if that was connected.  She was given the option to restart a lower dose and perhaps taper more slowly.  Off Depakote for a month.  She elected not to restart it.  08/19/2019 appointment the following is noted: Tinnitus and mild anxiety worse off the Depakote and with some mind racing esp in the AM.   Most is better. Reflux is worse off Depakote ER 250 and wondering if she should restart it.   Disc article on Depakote helping GERD by increasing lower esophageal  sphincter tone. Has travelled some with family events.  M-in-law 63 soon.   HA good with Aimovig. Don't do season changes well and more HA in Spring often. Sleep not great lately with leg cramps.  Started more exercise.  Started snoring and H is a light sleeper.  Meds help. Plan no med changes.  10/03/2019 phone call with patient asking to restart residual nefazodone that she has on hand.  She felt that it helped back pain and she is experiencing more back pain recently.  She is aware that it is no longer manufactured to our knowledge and once she runs out it will not be available.  She indicated she had an off to take half of 150 mg nefazodone tablet for about 2 to 3 months and would like to do so.  11/05/2019 appointment with the following noted:  Appt moved earlier DT the following. Had stopped Depakote and nefazadone and reflux got worse.  Restarting Depakote didn't help much.  Restarted nefazodaone and reflux better right away.  May not need Depakote but doesn't want to change.  Can't exercise DT back pain since Jan ans worse since March.  Getting new doctor.  More depressed and anxious and sleep problems DT back pain.   Usually only 0.25 mg alprazolam at night.  Patient denies difficulty with sleep initiation with Xanax.. Denies appetite disturbance.  Patient reports that energy  and motivation have been good.  Patient denies any difficulty with concentration.  Patient denies any suicidal ideation. More depressed and tearful with pain.  Insomnia managed with  With alprazolam.  Now not good time to taper.  No sig anxiety daytime usually.   H running for office.    Saw neurologist for Migraine and had problems with the meds RX.  Aimovig has helped..    Past Psychiatric Medication Trials: Failed multiple other antidepressants, nefazodone & Depakote for migraine,  citalopram palpitations ? Ever taken trazodone  Review of Systems:  Review of Systems  Musculoskeletal: Positive for  arthralgias and back pain.  Neurological: Negative for tremors, weakness and headaches.  Psychiatric/Behavioral: Negative for agitation, behavioral problems, confusion, decreased concentration, dysphoric mood, hallucinations, self-injury, sleep disturbance and suicidal ideas. The patient is not nervous/anxious and is not hyperactive.     Medications: I have reviewed the patient's current medications.  Current Outpatient Medications  Medication Sig Dispense Refill  . AIMOVIG 140 MG/ML SOAJ INJECT 140 MG INTO SKIN EVERY 30 DAYS 3 pen 3  . ALPRAZolam (XANAX) 0.25 MG tablet TAKE ONE TABLET BY MOUTH TWICE A DAY 60 tablet 5  . Ascorbic Acid (VITAMIN C PO) Take 1 tablet by mouth daily. 500mg  capsule    . baclofen (LIORESAL) 10 MG tablet 10 mg as needed.    Marland Kitchen CALCIUM CITRATE PO Take by mouth.    . Cholecalciferol (VITAMIN D PO) Take 800 Units by mouth daily.     Marland Kitchen conjugated estrogens (PREMARIN) vaginal cream Place 1 Applicatorful vaginally 2 (two) times a week. 42.5 g 5  . denosumab (PROLIA) 60 MG/ML SOSY injection Inject 60 mg into the skin every 6 (six) months.    . divalproex (DEPAKOTE) 250 MG DR tablet Take 250 mg by mouth daily.    Marland Kitchen FAMOTIDINE PO Take 40 mg by mouth daily.    . Gabapentin (NEURONTIN PO) Take 100 mg by mouth daily.     Marland Kitchen L-LYSINE PO Take 1 tablet by mouth daily.     Marland Kitchen lidocaine (LIDODERM) 5 % Place onto the skin as needed. Back pain    . magnesium 30 MG tablet Take 30 mg by mouth every other day.     . meclizine (ANTIVERT) 25 MG tablet Take 25 mg by mouth 3 (three) times daily as needed for dizziness.    . meloxicam (MOBIC) 7.5 MG tablet Take 7.5 mg by mouth daily.     . Multiple Vitamin (MULTIVITAMIN) tablet Take 1 tablet by mouth daily.    . nefazodone (SERZONE) 150 MG tablet Take 75 mg by mouth at bedtime.    . pantoprazole (PROTONIX) 40 MG tablet Take 40 mg by mouth 2 (two) times daily.    . predniSONE (STERAPRED UNI-PAK 21 TAB) 5 MG (21) TBPK tablet 6 day course.    .  Rimegepant Sulfate (NURTEC) 75 MG TBDP Take 75 mg by mouth daily as needed. For migraines. Take as close to onset of migraine as possible. One daily maximum. 4 tablet 0  . terconazole (TERAZOL 7) 0.4 % vaginal cream Place 1 applicator vaginally at bedtime. For 7 nights 45 g 0  . valACYclovir (VALTREX) 500 MG tablet Take 500 mg by mouth 2 (two) times daily as needed.    . vitamin E 400 UNIT capsule Take 200 Units by mouth daily.      No current facility-administered medications for this visit.    Medication Side Effects: None  Allergies:  Allergies  Allergen Reactions  .  Penicillins Other (See Comments)    Seizure as a child  . Atarax [Hydroxyzine] Other (See Comments)    Headache, depression  . Celexa [Citalopram Hydrobromide] Other (See Comments)    Chest pain  . Erythromycin      Upset stomache  . Flagyl [Metronidazole]     Dizzy and increased heart rate  . Nortriptyline Itching  . Prilosec [Omeprazole]     Abdominal pain  . Requip [Ropinirole]     Made sx worse  . Tramadol Itching  . Darvocet [Propoxyphene N-Acetaminophen] Itching  . Percocet [Oxycodone-Acetaminophen] Itching    Past Medical History:  Diagnosis Date  . Allergy   . Arthritis   . Cervical dysplasia   . Endometriosis   . Fatigue   . GERD (gastroesophageal reflux disease)   . IBS (irritable bowel syndrome)   . Migraine   . Osteoporosis 01/2019   T score -3.1  . Plantar fasciitis   . PONV (postoperative nausea and vomiting)    also difficult to wake up  . Recurrent vaginitis   . Reflux   . Scoliosis   . Wears glasses     Family History  Problem Relation Age of Onset  . Cancer Father        lymphoma  . Other Mother        bipolar,reflux  . Bipolar disorder Mother   . Other Brother        sinus problems  . Cancer Maternal Aunt        uterine cancer  . Breast cancer Maternal Aunt        40's  . Diabetes Maternal Aunt   . Cancer Paternal Aunt        Colon cancer  . Breast cancer Cousin  25       Mat. 1st cousin    Social History   Socioeconomic History  . Marital status: Married    Spouse name: Yolanda Carroll  . Number of children: 0  . Years of education: 58  . Highest education level: Not on file  Occupational History    Comment: [employer]  Tobacco Use  . Smoking status: Former Smoker    Quit date: 05/17/1983    Years since quitting: 36.4  . Smokeless tobacco: Never Used  Vaping Use  . Vaping Use: Never used  Substance and Sexual Activity  . Alcohol use: Yes    Alcohol/week: 1.0 - 2.0 standard drink    Types: 1 - 2 Standard drinks or equivalent per week    Comment: socially, occasional  . Drug use: No  . Sexual activity: Yes    Birth control/protection: Post-menopausal    Comment: intercourse age 45 , sexual partners more than 5  Other Topics Concern  . Not on file  Social History Narrative   Pt is married, no children.  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    Caffeine- very little.   Right handed   Lives at home with her husband   Social Determinants of Health   Financial Resource Strain:   . Difficulty of Paying Living Expenses:   Food Insecurity:   . Worried About Charity fundraiser in the Last Year:   . Arboriculturist in the Last Year:   Transportation Needs:   . Film/video editor (Medical):   Marland Kitchen Lack of Transportation (Non-Medical):   Physical Activity:   . Days of Exercise per Week:   . Minutes of Exercise per Session:  Stress:   . Feeling of Stress :   Social Connections:   . Frequency of Communication with Friends and Family:   . Frequency of Social Gatherings with Friends and Family:   . Attends Religious Services:   . Active Member of Clubs or Organizations:   . Attends Archivist Meetings:   Marland Kitchen Marital Status:   Intimate Partner Violence:   . Fear of Current or Ex-Partner:   . Emotionally Abused:   Marland Kitchen Physically Abused:   . Sexually Abused:     Past Medical History, Surgical  history, Social history, and Family history were reviewed and updated as appropriate.   Please see review of systems for further details on the patient's review from today.   Objective:   Physical Exam:  There were no vitals taken for this visit.  Physical Exam Neurological:     Mental Status: She is alert and oriented to person, place, and time.     Cranial Nerves: No dysarthria.  Psychiatric:        Attention and Perception: Attention and perception normal.        Mood and Affect: Mood is anxious and depressed.        Speech: Speech normal.        Behavior: Behavior is cooperative.        Thought Content: Thought content normal. Thought content is not paranoid or delusional. Thought content does not include homicidal or suicidal ideation. Thought content does not include homicidal or suicidal plan.        Cognition and Memory: Cognition and memory normal.        Judgment: Judgment normal.     Comments: Insight intact      Lab Review:     Component Value Date/Time   NA 137 03/04/2019 0949   K 4.2 03/04/2019 0949   CL 100 03/04/2019 0949   CO2 31 03/04/2019 0949   GLUCOSE 80 03/04/2019 0949   BUN 13 03/04/2019 0949   CREATININE 0.83 03/04/2019 0949   CALCIUM 9.5 03/04/2019 0949   PROT 7.6 02/21/2012 1746   ALBUMIN 4.2 02/21/2012 1746   AST 18 02/21/2012 1746   ALT 12 02/21/2012 1746   ALKPHOS 36 (L) 02/21/2012 1746   BILITOT 0.3 02/21/2012 1746   GFRNONAA >90 02/21/2012 1746   GFRAA >90 02/21/2012 1746       Component Value Date/Time   WBC 5.4 02/21/2012 1746   RBC 4.35 02/21/2012 1746   HGB 13.3 02/21/2012 1746   HCT 39.6 02/21/2012 1746   PLT 213 02/21/2012 1746   MCV 91.0 02/21/2012 1746   MCH 30.6 02/21/2012 1746   MCHC 33.6 02/21/2012 1746   RDW 12.8 02/21/2012 1746   LYMPHSABS 2.5 02/21/2012 1746   MONOABS 0.7 02/21/2012 1746   EOSABS 0.1 02/21/2012 1746   BASOSABS 0.0 02/21/2012 1746    Normal liver enzymes in September.  No results found for:  POCLITH, LITHIUM   No results found for: PHENYTOIN, PHENOBARB, VALPROATE, CBMZ   .res Assessment: Plan:    Auriah was seen today for follow-up, medication problem and stress.  Diagnoses and all orders for this visit:  Generalized anxiety disorder  Insomnia due to mental condition  Migraine without aura and without status migrainosus, not intractable  Gastroesophageal reflux disease without esophagitis  Greater than 50% of 30 min non face to face time with patient was spent on counseling and coordination of care. We discussed the following.  We discussed the short-term risks associated with  benzodiazepines including sedation and increased fall risk among others.  Discussed long-term side effect risk including dependence, potential withdrawal symptoms, and the potential eventual dose-related risk of dementia. Disc newer studies that cast in doubt the relationship with dementia. Taper Xanax by 1/4 of 0.25 mg daily per month if she chooses and when she chooses to do so.   Nefazodone helped the reflux and she wants to take it as long as possible.  She didn't have withdrawal off nefazodone. When she runs out then start trazodone.  If that fails then use Viibryd.  Disc SE and alternatives.   Depakote didn't help as much as expected and she might stop it.  This was a 30-minute appointment  FU  3 mos  Lynder Parents, MD, DFAPA  Please see After Visit Summary for patient specific instructions.  Future Appointments  Date Time Provider [place]  11/11/2019  2:00 PM [facility] NURSE [facility] None  12/04/2019  9:00 AM Philemon Kingdom, MD [facility] None  12/10/2019 10:00 AM [facility] MM 2 [facility]  08/18/2020  4:00 PM Cottle, Billey Co., MD [facility] None    No orders of the defined types were placed in this encounter.     -------------------------------

## 2019-11-06 ENCOUNTER — Ambulatory Visit: Payer: 59

## 2019-11-11 ENCOUNTER — Ambulatory Visit: Payer: 59

## 2019-11-11 ENCOUNTER — Other Ambulatory Visit: Payer: Self-pay

## 2019-11-11 DIAGNOSIS — M810 Age-related osteoporosis without current pathological fracture: Secondary | ICD-10-CM

## 2019-11-11 MED ORDER — DENOSUMAB 60 MG/ML ~~LOC~~ SOSY
60.0000 mg | PREFILLED_SYRINGE | Freq: Once | SUBCUTANEOUS | Status: AC
  Administered 2019-11-11: 60 mg via SUBCUTANEOUS

## 2019-11-11 NOTE — Progress Notes (Signed)
Pt presents today for nurse visit for an injection. Per Dr. Cruzita Lederer, injection of Prolia 60mg /mL given SQ in L upper arm today by A. Jazz Rogala, LPN. Denies any adverse reactions or discomfort.

## 2019-11-24 ENCOUNTER — Telehealth: Payer: Self-pay | Admitting: Psychiatry

## 2019-11-24 NOTE — Telephone Encounter (Signed)
Error. Pt is requesting a refill on her Nefazadone filled at the [facility] on [address] . She is also requesting a discount card for her Depakote. She can't do generic. Next appt 8/25.

## 2019-11-24 NOTE — Telephone Encounter (Signed)
Pt requesting a refill on her Trazadone.

## 2019-11-25 ENCOUNTER — Other Ambulatory Visit: Payer: Self-pay

## 2019-11-25 ENCOUNTER — Ambulatory Visit
Admission: RE | Admit: 2019-11-25 | Discharge: 2019-11-25 | Disposition: A | Discharge status: Home / Self Care | Payer: 59 | Source: Ambulatory Visit | Attending: Orthopedic Surgery | Admitting: Orthopedic Surgery

## 2019-11-25 DIAGNOSIS — G8929 Other chronic pain: Secondary | ICD-10-CM

## 2019-11-25 DIAGNOSIS — M545 Low back pain, unspecified: Secondary | ICD-10-CM

## 2019-11-25 MED ORDER — NEFAZODONE HCL 150 MG PO TABS: 150.0000 mg | ORAL_TABLET | Freq: Every day | ORAL | 0 refills | Status: DC

## 2019-11-25 NOTE — Telephone Encounter (Signed)
Refill for Nefazodone sent to [facility].  Patient can go to depakote.com to download a discount card for using [facility]

## 2019-11-28 ENCOUNTER — Other Ambulatory Visit: Payer: Self-pay

## 2019-11-28 ENCOUNTER — Telehealth: Payer: Self-pay | Admitting: Psychiatry

## 2019-11-28 MED ORDER — DEPAKOTE ER 250 MG PO TB24: 250.0000 mg | ORAL_TABLET | Freq: Every day | ORAL | 1 refills | Status: DC

## 2019-11-28 MED ORDER — DEPAKOTE 250 MG PO TBEC: 250.0000 mg | DELAYED_RELEASE_TABLET | Freq: Every day | ORAL | 0 refills | Status: DC

## 2019-11-28 NOTE — Telephone Encounter (Signed)
Levada Dy with [facility] called about Tashari's Depakote prescription that was submitted today.  She said it is different that what she had been taking in the past.  ER vs DR.  Michela Pitcher they are not the same and to please call to clarify which one she should be on [phone]

## 2019-11-28 NOTE — Telephone Encounter (Signed)
Contacted patient, she did restart her Depakote she had been off of it for awhile then restarted it. She restarted a lower dose of Depakote ER not the DR 250 mg 1 daily. She is requesting the [facility] name only to use a coupon she's printed off the computer.   She apologized for all the confusion. Will update pharmacy with correct dosing and change to [facility] name

## 2019-11-28 NOTE — Telephone Encounter (Signed)
Will have to confirm with patient and past paper chart. Information on file shows DR not ER. Could be an error when it was entered into system.

## 2019-11-28 NOTE — Telephone Encounter (Signed)
Pt called about Depakote, tried to refill with card and Pharmacy stated Rx on file too old. Need new Rx sent to [facility] to use card.

## 2019-12-03 NOTE — Progress Notes (Signed)
Patient ID: Yolanda Carroll, female   DOB: [birth year], 64 y.o.   MRN: [id number]   This visit occurred during the SARS-CoV-2 public health emergency.  Safety protocols were in place, including screening questions prior to the visit, additional usage of staff PPE, and extensive cleaning of exam room while observing appropriate contact time as indicated for disinfecting solutions.   HPI  Yolanda Carroll is a 64 y.o.-year-old female, initially referred by Dr. Phineas Real, presenting for follow-up for osteoporosis.  Last visit 1 year and 1 month ago.  She was diagnosed with osteoporosis in early 2000.  Reviewed patient's DXA scan reports: Date L1-L4 T score FN T score 33% distal Radius (left)  Ultra distal radius (left)  02/11/2019 ([facility]) N/a due to scoliosis RFN: -3.1 (-8.2%*) LFN: -2.4 (-4.7%)  -0.1 (-9.6%*)  0.0  02/06/2017 ([facility]) -0.3 (moderate scoliosis) RFN: -2.6 LFN: -2.2  +1.1 -0.1  12/29/2014 -0.5 (moderate scoliosis) RFN: -2.6 LFN: -1.7 n/a n/a  12/12/2012 -0.5 (moderate scoliosis) RFN: -2.4 LFN: -1.9 n/a n/a  10/04/2010 n/a RFN: -2.3 LFN: -1.8 n/a n/a   She denies dizziness/orthostasis/poor vision.  No falls or fractures since last visit.  She had one episode of vertigo for 2 weeks in 2018.   She has  a history of steroid courses: P.o. and IM for headaches, intraspinal for OA.  Reviewed previous osteoporosis treatments: - Actonel in 2006-2007 - Forteo for 2 years in 2008 - Boniva in 2010 - HRT: Estradiol 0.5, Provera 2.5 - now off - Prolia -05/02/2019, 11/11/2019.  She did have increased back pain after starting Prolia and she believes that these may be related.  She does have a history of back pain and right hip pain when walking, but in the past they were responsive to steroids and not persisting long-term, while now she has been having the pain since the beginning of the year (over the last at least 6 months). She was in PT for OA in the past. She is in PT for back  pain now, at [facility].  No history of vitamin D deficiency: Lab Results  Component Value Date   VD25OH 70.27 03/04/2019   VD25OH 55 03/13/2017  02/11/2018: Vit D 61.9 04/30/2017: vit D 53.4  She is on: - calcium citrate chewables >> 200 mg 1-2 a day - Vitamin D + MVI >> total ~2000 units daily (15000 IU a week)  No weight bearing exercises. She also cannot walk 2/2 back pain.  She is not taking high vitamin A doses.  Menopause was at 39s y/o.   Pt does have a FH of osteoporosis in mother, who had a hip fracture.  No history of kidney stones, or hyper/hypocalcemia or hyperparathyroidism: Lab Results  Component Value Date   CALCIUM 9.5 03/04/2019   CALCIUM 9.5 02/21/2012   CALCIUM 9.8 07/10/2011  02/11/2018: Ca normal  No history of thyrotoxicosis.  Last TSH:  02/11/2018: TSH 2.79 05/01/2017: TSH 1.71 04/28/2013: TSH 2.063 No results found for: TSH   No CKD. Last BUN/Cr: Lab Results  Component Value Date   BUN 13 03/04/2019   CREATININE 0.83 03/04/2019  02/11/2018: CMP normal, with glucose 76, BUN/creatinine 12/0.86 05/01/2017: 15/0.82 04/2013: 14/0.84  On Depakote for migraines.  ROS: Constitutional: no weight gain/no weight loss, no fatigue, no subjective hyperthermia, no subjective hypothermia Eyes: no blurry vision, no xerophthalmia ENT: no sore throat, no nodules palpated in neck, no dysphagia, no odynophagia, no hoarseness Cardiovascular: no CP/no SOB/no palpitations/no leg swelling Respiratory: no cough/no  SOB/no wheezing Gastrointestinal: no N/no V/no D/no C/no acid reflux Musculoskeletal: no muscle aches/no joint aches Skin: no rashes, no hair loss Neurological: no tremors/no numbness/no tingling/no dizziness  I reviewed pt's medications, allergies, PMH, social hx, family hx, and changes were documented in the history of present illness. Otherwise, unchanged from my initial visit note.  Past Medical History:  Diagnosis Date  . Allergy    . Arthritis   . Cervical dysplasia   . Endometriosis   . Fatigue   . GERD (gastroesophageal reflux disease)   . IBS (irritable bowel syndrome)   . Migraine   . Osteoporosis 01/2019   T score -3.1  . Plantar fasciitis   . PONV (postoperative nausea and vomiting)    also difficult to wake up  . Recurrent vaginitis   . Reflux   . Scoliosis   . Wears glasses    Past Surgical History:  Procedure Laterality Date  . ANTERIOR CERVICAL DECOMP/DISCECTOMY FUSION  04/17/2011   Procedure: ANTERIOR CERVICAL DECOMPRESSION/DISCECTOMY FUSION 2 LEVELS;  Surgeon: Hosie Spangle;  Location: [facility];  Service: Neurosurgery;  Laterality: N/A;  Cervical five-six, six-seven anterior cervical decompression with fusion,  plating,  and bonegraft   . APPENDECTOMY  1978  . BREAST BIOPSY  07/13/2011   Procedure: BREAST BIOPSY WITH NEEDLE LOCALIZATION;  Surgeon: Rolm Bookbinder, MD;  Location: [facility];  Service: General;  Laterality: Right;  Right breast wire localization biopsy  . BREAST EXCISIONAL BIOPSY Right 2013  . BREAST SURGERY     Breast Bx-Benign  . CYSTOSCOPY    . GYNECOLOGIC CRYOSURGERY    . HERNIA REPAIR  08/02/1995   [facility]  . LAPAROSCOPIC ENDOMETRIOSIS FULGURATION  1997  . PELVIC LAPAROSCOPY    . ROTATOR CUFF REPAIR     right 2002 left 2000   Social History   Socioeconomic History  . Marital status: Married    Spouse name: Fritz Pickerel  . Number of children: 0  . 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 Smoking status: Former Smoker    Last attempt to quit: 05/17/1983    Years since quitting: 34.0  . Smokeless tobacco: Never Used  Substance and Sexual Activity  . Alcohol use: Yes    Alcohol/week: 0.6 oz    Types: 1-2 Standard drinks or equivalent per week    Comment: socially, occasional  . Drug use: No  . Sexual activity: Yes    Birth control/protection:  Post-menopausal    Comment: intercourse age 45 , sexual partners more than 5  Other Topics Concern  . Not on file  Social History Narrative   Pt is married, no children.  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    Caffeine- very little.   Current Outpatient Medications on File Prior to Visit  Medication Sig Dispense Refill  . AIMOVIG 140 MG/ML SOAJ INJECT 140 MG INTO SKIN EVERY 30 DAYS 3 pen 3  . ALPRAZolam (XANAX) 0.25 MG tablet TAKE ONE TABLET BY MOUTH TWICE A DAY 60 tablet 5  . Ascorbic Acid (VITAMIN C PO) Take 1 tablet by mouth daily. 500mg  capsule    . baclofen (LIORESAL) 10 MG tablet 10 mg as needed.    Marland Kitchen CALCIUM CITRATE PO Take by mouth.    . Cholecalciferol (VITAMIN D PO) Take 800 Units by mouth daily.     Marland Kitchen conjugated estrogens (PREMARIN) vaginal cream  Place 1 Applicatorful vaginally 2 (two) times a week. 42.5 g 5  . denosumab (PROLIA) 60 MG/ML SOSY injection Inject 60 mg into the skin every 6 (six) months.    . DEPAKOTE ER 250 MG 24 hr tablet Take 1 tablet (250 mg total) by mouth daily. 90 tablet 1  . FAMOTIDINE PO Take 40 mg by mouth daily.    . Gabapentin (NEURONTIN PO) Take 100 mg by mouth daily.     Marland Kitchen L-LYSINE PO Take 1 tablet by mouth daily.     Marland Kitchen lidocaine (LIDODERM) 5 % Place onto the skin as needed. Back pain    . magnesium 30 MG tablet Take 30 mg by mouth every other day.     . meclizine (ANTIVERT) 25 MG tablet Take 25 mg by mouth 3 (three) times daily as needed for dizziness.    . meloxicam (MOBIC) 7.5 MG tablet Take 7.5 mg by mouth daily.     . Multiple Vitamin (MULTIVITAMIN) tablet Take 1 tablet by mouth daily.    . nefazodone (SERZONE) 150 MG tablet Take 1 tablet (150 mg total) by mouth at bedtime. 90 tablet 0  . pantoprazole (PROTONIX) 40 MG tablet Take 40 mg by mouth 2 (two) times daily.    . predniSONE (STERAPRED UNI-PAK 21 TAB) 5 MG (21) TBPK tablet 6 day course.    . Rimegepant Sulfate (NURTEC) 75 MG TBDP Take 75 mg by mouth daily as needed.  For migraines. Take as close to onset of migraine as possible. One daily maximum. 4 tablet 0  . terconazole (TERAZOL 7) 0.4 % vaginal cream Place 1 applicator vaginally at bedtime. For 7 nights 45 g 0  . valACYclovir (VALTREX) 500 MG tablet Take 500 mg by mouth 2 (two) times daily as needed.    . vitamin E 400 UNIT capsule Take 200 Units by mouth daily.      No current facility-administered medications on file prior to visit.   Allergies  Allergen Reactions  . Penicillins Other (See Comments)    Seizure as a child  . Atarax [Hydroxyzine] Other (See Comments)    Headache, depression  . Celexa [Citalopram Hydrobromide] Other (See Comments)    Chest pain  . Erythromycin      Upset stomache  . Flagyl [Metronidazole]     Dizzy and increased heart rate  . Nortriptyline Itching  . Prilosec [Omeprazole]     Abdominal pain  . Requip [Ropinirole]     Made sx worse  . Tramadol Itching  . Darvocet [Propoxyphene N-Acetaminophen] Itching  . Percocet [Oxycodone-Acetaminophen] Itching   Family History  Problem Relation Age of Onset  . Cancer Father        lymphoma  . Other Mother        bipolar,reflux  . Bipolar disorder Mother   . Other Brother        sinus problems  . Cancer Maternal Aunt        uterine cancer  . Breast cancer Maternal Aunt        40's  . Diabetes Maternal Aunt   . Cancer Paternal Aunt        Colon cancer  . Breast cancer Cousin 62       Mat. 1st cousin   PE: There were no vitals taken for this visit. Wt Readings from Last 3 Encounters:  09/08/19 133 lb (60.3 kg)  08/05/19 136 lb (61.7 kg)  05/02/19 141 lb (64 kg)   Constitutional: normal weight, in NAD Eyes:  PERRLA, EOMI, no exophthalmos ENT: moist mucous membranes, no thyromegaly, no cervical lymphadenopathy Cardiovascular: RRR, No MRG Respiratory: CTA B Gastrointestinal: abdomen soft, NT, ND, BS+ Musculoskeletal: no deformities, strength intact in all 4 Skin: moist, warm, no rashes Neurological: no  tremor with outstretched hands, DTR normal in all 4  Assessment: 1. Osteoporosis  Plan: 1. Osteoporosis -Likely age-related and postmenopausal and she also has a family history of osteoporosis -We reviewed together her latest bone density scans from 01/2017 in 01/2019 and it appears that her both femoral neck T-scores have decreased while only the right femoral neck decrease was significant.  She also had decrease in bone density at the level of the radius.  Her spine could not be analyzed on the latest bone density scan due to scoliosis.  After the results returned, she agreed to start Prolia.  She got her first injection in 04/2019.  She did complain of back pain, but she was complaining about this at last visit, also, before starting Prolia.  We discussed that it would be unlikely to have back pain after starting Prolia in the absence of a fracture.  She did not fall or have any known fractures since last visit. -For now, since she complains of increased and continued pain after Prolia, even though this is not a known side effect of the medication, I suggested to switch to Reclast.  She just got the Prolia injection at the end of 10/2019, so we plan to give her Reclast injection in 04/2020.  -At this visit, we discussed about her calcium and vitamin D supplements.  Her vitamin D level was normal in 04/2019 and she continues on approximately 2000 units daily.  Will recheck her level today. -She is also taking calcium 200 mg 1-2x a day.  For now, I advised her that she can stay on this dose.  She is also stable on a low acid diet. -I would have recommended to try to do weightbearing exercises at least 5 out of 7 days, however, she cannot exercise and even walk at this point because of back pain -She had a normal kidney function in 02/2019 and we will repeat this today along with a vitamin D level, to prepare for the Reclast preauthorization. -I will see the patient back in 1 year.  Component      Latest Ref Rng & Units 12/04/2019  Sodium     135 - 145 mEq/L 136  Potassium     3.5 - 5.1 mEq/L 3.7  Chloride     96 - 112 mEq/L 100  CO2     19 - 32 mEq/L 29  Glucose     70 - 99 mg/dL 61 (L)  BUN     6 - 23 mg/dL 11  Creatinine     0.40 - 1.20 mg/dL 0.85  Calcium     8.4 - 10.5 mg/dL 9.4  GFR     >60.00 mL/min 67.33  Vitamin D, 25-Hydroxy     30.0 - 100.0 ng/mL 49.3  Labs are normal.  We will go ahead with the plan to start Reclast in December.  Philemon Kingdom, MD PhD [facility]

## 2019-12-04 ENCOUNTER — Ambulatory Visit: Payer: 59 | Admitting: Internal Medicine

## 2019-12-04 ENCOUNTER — Other Ambulatory Visit: Payer: Self-pay

## 2019-12-04 ENCOUNTER — Encounter: Payer: Self-pay | Admitting: Internal Medicine

## 2019-12-04 VITALS — BP 120/70 | HR 80 | Ht 66.0 in | Wt 131.0 lb

## 2019-12-04 DIAGNOSIS — M810 Age-related osteoporosis without current pathological fracture: Secondary | ICD-10-CM | POA: Diagnosis not present

## 2019-12-04 LAB — BASIC METABOLIC PANEL
BUN: 11 mg/dL (ref 6–23)
CO2: 29 mEq/L (ref 19–32)
Calcium: 9.4 mg/dL (ref 8.4–10.5)
Chloride: 100 mEq/L (ref 96–112)
Creatinine, Ser: 0.85 mg/dL (ref 0.40–1.20)
GFR: 67.33 mL/min (ref >60.00)
Glucose, Bld: 61 mg/dL — ABNORMAL LOW (ref 70–99)
Potassium: 3.7 mEq/L (ref 3.5–5.1)
Sodium: 136 mEq/L (ref 135–145)

## 2019-12-04 NOTE — Patient Instructions (Signed)
We will stop Prolia and start Reclast in 04/2020.  Please stop at the lab.  Please come back for a follow-up appointment in 1 year.

## 2019-12-05 LAB — VITAMIN D 25 HYDROXY (VIT D DEFICIENCY, FRACTURES): Vit D, 25-Hydroxy: 49.3 ng/mL (ref 30.0–100.0)

## 2019-12-08 ENCOUNTER — Telehealth: Payer: Self-pay | Admitting: *Deleted

## 2019-12-08 DIAGNOSIS — R102 Pelvic and perineal pain: Secondary | ICD-10-CM

## 2019-12-08 NOTE — Telephone Encounter (Signed)
Will route to appointment desk to schedule, order placed.

## 2019-12-08 NOTE — Telephone Encounter (Signed)
Patient called c/o 1 week of urgency and pelvic pressure, saw PCP had negative u/a and culture per patient. PCP recommended patient follow up with GYN for possible ultrasound and orthopedic for lower back discomfort as well which has been going on since April. Do you want see/exam patient first or proceed with pelvic ultrasound?

## 2019-12-08 NOTE — Telephone Encounter (Signed)
Pelvic ultrasound first would be fine, followed by appointment with me.  Thank you.

## 2019-12-09 ENCOUNTER — Other Ambulatory Visit: Payer: Self-pay | Admitting: Endocrinology

## 2019-12-09 DIAGNOSIS — M810 Age-related osteoporosis without current pathological fracture: Secondary | ICD-10-CM

## 2019-12-09 NOTE — Telephone Encounter (Signed)
Spoke with patient she wants a abdominal ultrasound she will call back to schedule if pelvic ultrasound is needed.

## 2019-12-10 ENCOUNTER — Other Ambulatory Visit: Payer: Self-pay | Admitting: Family Medicine

## 2019-12-10 ENCOUNTER — Ambulatory Visit: Payer: 59

## 2019-12-10 DIAGNOSIS — R1030 Lower abdominal pain, unspecified: Secondary | ICD-10-CM

## 2019-12-18 ENCOUNTER — Other Ambulatory Visit: Payer: 59

## 2019-12-21 ENCOUNTER — Other Ambulatory Visit: Payer: Self-pay

## 2019-12-21 ENCOUNTER — Emergency Department (HOSPITAL_COMMUNITY)
Admission: EM | Admit: 2019-12-21 | Discharge: 2019-12-21 | Disposition: A | Discharge status: Home / Self Care | Payer: 59 | Attending: Emergency Medicine | Admitting: Emergency Medicine

## 2019-12-21 ENCOUNTER — Encounter (HOSPITAL_COMMUNITY): Payer: Self-pay | Admitting: *Deleted

## 2019-12-21 ENCOUNTER — Emergency Department (HOSPITAL_COMMUNITY): Payer: 59

## 2019-12-21 DIAGNOSIS — U071 COVID-19: Secondary | ICD-10-CM | POA: Diagnosis not present

## 2019-12-21 DIAGNOSIS — Z87891 Personal history of nicotine dependence: Secondary | ICD-10-CM | POA: Diagnosis not present

## 2019-12-21 DIAGNOSIS — R110 Nausea: Secondary | ICD-10-CM | POA: Diagnosis present

## 2019-12-21 LAB — CBC WITH DIFFERENTIAL/PLATELET
Abs Immature Granulocytes: 0.01 10*3/uL (ref 0.00–0.07)
Basophils Absolute: 0 10*3/uL (ref 0.0–0.1)
Basophils Relative: 0 %
Eosinophils Absolute: 0 10*3/uL (ref 0.0–0.5)
Eosinophils Relative: 0 %
HCT: 37 % (ref 36.0–46.0)
Hemoglobin: 12.5 g/dL (ref 12.0–15.0)
Immature Granulocytes: 0 %
Lymphocytes Relative: 22 %
Lymphs Abs: 0.7 10*3/uL (ref 0.7–4.0)
MCH: 31.6 pg (ref 26.0–34.0)
MCHC: 33.8 g/dL (ref 30.0–36.0)
MCV: 93.7 fL (ref 80.0–100.0)
Monocytes Absolute: 0.6 10*3/uL (ref 0.1–1.0)
Monocytes Relative: 17 %
Neutro Abs: 2 10*3/uL (ref 1.7–7.7)
Neutrophils Relative %: 61 %
Platelets: 160 10*3/uL (ref 150–400)
RBC: 3.95 MIL/uL (ref 3.87–5.11)
RDW: 12.6 % (ref 11.5–15.5)
WBC: 3.4 10*3/uL — ABNORMAL LOW (ref 4.0–10.5)
nRBC: 0 % (ref 0.0–0.2)

## 2019-12-21 LAB — COMPREHENSIVE METABOLIC PANEL
ALT: 24 U/L (ref 0–44)
AST: 24 U/L (ref 15–41)
Albumin: 4.3 g/dL (ref 3.5–5.0)
Alkaline Phosphatase: 42 U/L (ref 38–126)
Anion gap: 11 (ref 5–15)
BUN: <5 mg/dL — ABNORMAL LOW (ref 8–23)
CO2: 25 mmol/L (ref 22–32)
Calcium: 8.3 mg/dL — ABNORMAL LOW (ref 8.9–10.3)
Chloride: 93 mmol/L — ABNORMAL LOW (ref 98–111)
Creatinine, Ser: 0.85 mg/dL (ref 0.44–1.00)
GFR calc Af Amer: >60 mL/min (ref >60)
GFR calc non Af Amer: >60 mL/min (ref >60)
Glucose, Bld: 100 mg/dL — ABNORMAL HIGH (ref 70–99)
Potassium: 3.7 mmol/L (ref 3.5–5.1)
Sodium: 129 mmol/L — ABNORMAL LOW (ref 135–145)
Total Bilirubin: 0.4 mg/dL (ref 0.3–1.2)
Total Protein: 7.6 g/dL (ref 6.5–8.1)

## 2019-12-21 LAB — URINALYSIS, ROUTINE W REFLEX MICROSCOPIC
Bilirubin Urine: NEGATIVE
Glucose, UA: NEGATIVE mg/dL
Hgb urine dipstick: NEGATIVE
Ketones, ur: 5 mg/dL — AB
Leukocytes,Ua: NEGATIVE
Nitrite: NEGATIVE
Protein, ur: NEGATIVE mg/dL
Specific Gravity, Urine: 1.003 — ABNORMAL LOW (ref 1.005–1.030)
pH: 7 (ref 5.0–8.0)

## 2019-12-21 MED ORDER — SODIUM CHLORIDE 0.9 % IV BOLUS
500.0000 mL | Freq: Once | INTRAVENOUS | Status: AC
  Administered 2019-12-21: 500 mL via INTRAVENOUS

## 2019-12-21 MED ORDER — SODIUM CHLORIDE 0.9 % IV SOLN: INTRAVENOUS | Status: DC

## 2019-12-21 NOTE — ED Provider Notes (Signed)
[facility] DEPT Provider Note   CSN: [id number] Arrival date & time: 12/21/19  3536     History Chief Complaint  Patient presents with  . Nausea    COVID positive    Yolanda Carroll is a 64 y.o. female.  HPI She presents for evaluation of persistent nausea with decreased oral intake and feeling dizzy.  She is trying to drink and eat but having trouble getting enough nutrition and fluids and.  She was diagnosed with a Covid infection, 4 days ago, by her PCP.  This result is not available in the EMR.  She reports being sick about a week with primary symptoms being gastrointestinal.  Her husband is also sick with a Covid infection, for 2 weeks.  She was not vaccinated against Covid.  She is using Tylenol with relief of fever.  She denies significant cough or shortness of breath.  She is taking her usual medications.  She had some diarrhea that resolved after taking Imodium.  She denies abdominal pain.  She is using Zofran with partial relief of nausea but still is anorexic.  There are no other known modifying factors.    Past Medical History:  Diagnosis Date  . Allergy   . Arthritis   . Cervical dysplasia   . Endometriosis   . Fatigue   . GERD (gastroesophageal reflux disease)   . IBS (irritable bowel syndrome)   . Migraine   . Osteoporosis 01/2019   T score -3.1  . Plantar fasciitis   . PONV (postoperative nausea and vomiting)    also difficult to wake up  . Recurrent vaginitis   . Reflux   . Scoliosis   . Wears glasses     Patient Active Problem List   Diagnosis Date Noted  . Anxiety 04/03/2018  . Generalized anxiety disorder 02/26/2018  . Chronic migraine without aura without status migrainosus, not intractable 01/29/2018  . Left ankle instability 11/05/2012  . Abnormality of gait 09/04/2012  . Plantar fasciitis of right foot 06/12/2012  . Scoliosis 06/12/2012  . Cervical disc disorder with radiculopathy of cervical region 04/17/2011    . Osteoporosis     Past Surgical History:  Procedure Laterality Date  . ANTERIOR CERVICAL DECOMP/DISCECTOMY FUSION  04/17/2011   Procedure: ANTERIOR CERVICAL DECOMPRESSION/DISCECTOMY FUSION 2 LEVELS;  Surgeon: Hosie Spangle;  Location: [facility];  Service: Neurosurgery;  Laterality: N/A;  Cervical five-six, six-seven anterior cervical decompression with fusion,  plating,  and bonegraft   . APPENDECTOMY  1978  . BREAST BIOPSY  07/13/2011   Procedure: BREAST BIOPSY WITH NEEDLE LOCALIZATION;  Surgeon: Rolm Bookbinder, MD;  Location: [facility];  Service: General;  Laterality: Right;  Right breast wire localization biopsy  . BREAST EXCISIONAL BIOPSY Right 2013  . BREAST SURGERY     Breast Bx-Benign  . CYSTOSCOPY    . GYNECOLOGIC CRYOSURGERY    . HERNIA REPAIR  08/02/1995   RIH  . LAPAROSCOPIC ENDOMETRIOSIS FULGURATION  1997  . PELVIC LAPAROSCOPY    . ROTATOR CUFF REPAIR     right 2002 left 2000     OB History    Gravida  0   Para      Term      Preterm      AB      Living        SAB      TAB      Ectopic      Multiple  Live Births              Family History  Problem Relation Age of Onset  . Cancer Father        lymphoma  . Other Mother        bipolar,reflux  . Bipolar disorder Mother   . Other Brother        sinus problems  . Cancer Maternal Aunt        uterine cancer  . Breast cancer Maternal Aunt        40's  . Diabetes Maternal Aunt   . Cancer Paternal Aunt        Colon cancer  . Breast cancer Cousin 46       Mat. 1st cousin    Social History   Tobacco Use  . Smoking status: Former Smoker    Quit date: 05/17/1983    Years since quitting: 36.6  . Smokeless tobacco: Never Used  Vaping Use  . Vaping Use: Never used  Substance Use Topics  . Alcohol use: Yes    Alcohol/week: 1.0 - 2.0 standard drink    Types: 1 - 2 Standard drinks or equivalent per week    Comment: socially, occasional  . Drug use: No     Home Medications Prior to Admission medications   Medication Sig Start Date End Date Taking? Authorizing Provider  AIMOVIG 140 MG/ML SOAJ INJECT 140 MG INTO SKIN EVERY 30 DAYS 08/29/19   Melvenia Beam, MD  ALPRAZolam Duanne Moron) 0.25 MG tablet TAKE ONE TABLET BY MOUTH TWICE A DAY 07/07/19   Cottle, Billey Co., MD  Ascorbic Acid (VITAMIN C PO) Take 1 tablet by mouth daily. 500mg  capsule    [provider]  baclofen (LIORESAL) 10 MG tablet 10 mg as needed. 12/24/15   [provider]  CALCIUM CITRATE PO Take by mouth.    [provider]  Cholecalciferol (VITAMIN D PO) Take 800 Units by mouth daily.     [provider]  conjugated estrogens (PREMARIN) vaginal cream Place 1 Applicatorful vaginally 2 (two) times a week. 09/08/19   Joseph Pierini, MD  denosumab (PROLIA) 60 MG/ML SOSY injection Inject 60 mg into the skin every 6 (six) months.    [provider]  DEPAKOTE ER 250 MG 24 hr tablet Take 1 tablet (250 mg total) by mouth daily. 11/28/19   Cottle, Billey Co., MD  FAMOTIDINE PO Take 40 mg by mouth daily.    [provider]  Gabapentin (NEURONTIN PO) Take 100 mg by mouth daily.     [provider]  L-LYSINE PO Take 1 tablet by mouth daily.     [provider]  lidocaine (LIDODERM) 5 % Place onto the skin as needed. Back pain    [provider]  magnesium 30 MG tablet Take 30 mg by mouth every other day.     [provider]  meclizine (ANTIVERT) 25 MG tablet Take 25 mg by mouth 3 (three) times daily as needed for dizziness.    [provider]  meloxicam (MOBIC) 7.5 MG tablet Take 7.5 mg by mouth daily.  11/08/14   [provider]  Multiple Vitamin (MULTIVITAMIN) tablet Take 1 tablet by mouth daily.    [provider]  nefazodone (SERZONE) 150 MG tablet Take 1 tablet (150 mg total) by mouth at bedtime. 11/25/19   Cottle, Billey Co., MD  pantoprazole (PROTONIX) 40 MG tablet Take 40  mg by mouth 2 (two) times  daily.    [provider]  Rimegepant Sulfate (NURTEC) 75 MG TBDP Take 75 mg by mouth daily as needed. For migraines. Take as close to onset of migraine as possible. One daily maximum. 08/05/19   Melvenia Beam, MD  valACYclovir (VALTREX) 500 MG tablet Take 500 mg by mouth 2 (two) times daily as needed.    [provider]  vitamin E 400 UNIT capsule Take 200 Units by mouth daily.     [provider]    Allergies    Penicillins, Atarax [hydroxyzine], Celexa [citalopram hydrobromide], Erythromycin, Flagyl [metronidazole], Nortriptyline, Prilosec [omeprazole], Requip [ropinirole], Tramadol, Darvocet [propoxyphene n-acetaminophen], and Percocet [oxycodone-acetaminophen]  Review of Systems   Review of Systems  All other systems reviewed and are negative.   Physical Exam Updated Vital Signs BP 121/65 (BP Location: Left Arm)   Pulse 77   Temp 98.5 F (36.9 C) (Oral)   Resp 18   Wt 59 kg   SpO2 95%   BMI 20.98 kg/m   Physical Exam Vitals and nursing note reviewed.  Constitutional:      General: She is not in acute distress.    Appearance: She is well-developed. She is not ill-appearing, toxic-appearing or diaphoretic.  HENT:     Head: Normocephalic and atraumatic.     Right Ear: External ear normal.     Left Ear: External ear normal.     Mouth/Throat:     Mouth: Mucous membranes are moist.     Pharynx: No oropharyngeal exudate or posterior oropharyngeal erythema.  Eyes:     Conjunctiva/sclera: Conjunctivae normal.     Pupils: Pupils are equal, round, and reactive to light.  Neck:     Trachea: Phonation normal.  Cardiovascular:     Rate and Rhythm: Normal rate and regular rhythm.  Pulmonary:     Effort: Pulmonary effort is normal. No respiratory distress.     Breath sounds: No stridor. No wheezing or rhonchi.  Abdominal:     General: There is no distension.     Palpations: Abdomen is soft. There is no mass.      Tenderness: There is no abdominal tenderness. There is no guarding or rebound.  Musculoskeletal:        General: Normal range of motion.     Cervical back: Normal range of motion and neck supple.     Right lower leg: No edema.     Left lower leg: No edema.  Skin:    General: Skin is warm and dry.  Neurological:     Mental Status: She is alert and oriented to person, place, and time.     Cranial Nerves: No cranial nerve deficit.     Sensory: No sensory deficit.     Motor: No abnormal muscle tone.     Coordination: Coordination normal.  Psychiatric:        Mood and Affect: Mood normal.        Behavior: Behavior normal.        Thought Content: Thought content normal.        Judgment: Judgment normal.     ED Results / Procedures / Treatments   Labs (all labs ordered are listed, but only abnormal results are displayed) Labs Reviewed - No data to display  EKG None  Radiology No results found.  Procedures Procedures (including critical care time)  Medications Ordered in ED Medications - No data to display  ED Course  I have reviewed the triage vital signs and the nursing  notes.  Pertinent labs & imaging results that were available during my care of the patient were reviewed by me and considered in my medical decision making (see chart for details).    MDM Rules/Calculators/A&P                           Patient Vitals for the past 24 hrs:  BP Temp Temp src Pulse Resp SpO2 Weight  12/21/19 0735 -- -- -- -- -- -- 59 kg  12/21/19 0730 121/65 98.5 F (36.9 C) Oral 77 18 95 % --   At time of discharge-discussed with patient and all questions were answered  Medical Decision Making:  This patient is presenting for evaluation of symptomatic Covid infection, which does require a range of treatment options, and is a complaint that involves a moderate risk of morbidity and mortality. The differential diagnoses include dehydration, metabolic instability, severe Covid  infection. I decided to review old records, and in summary generally healthy elderly female with illness for 1 week, known Covid infection, and primary complaint of difficulty eating because of nausea..  I did not require additional historical information from anyone.  Clinical Laboratory Tests Ordered, included CBC, Metabolic panel and Urinalysis. Review indicates screening testing is reassuring.  No acute metabolic or bacterial processes encountered.. Radiologic Tests Ordered, included chest x-ray.  I independently Visualized: Radiographic images, which show possible left lower lobe infiltrate    Critical Interventions-clinical evaluation, laboratory testing, chest x-ray, observation reassessment.  Yolanda Carroll was evaluated in Emergency Department on 12/21/2019 for the symptoms described in the history of present illness. She was evaluated in the context of the global COVID-19 pandemic, which necessitated consideration that the patient might be at risk for infection with the SARS-CoV-2 virus that causes COVID-19. Institutional protocols and algorithms that pertain to the evaluation of patients at risk for COVID-19 are in a state of rapid change based on information released by regulatory bodies including the CDC and federal and state organizations. These policies and algorithms were followed during the patient's care in the ED.  After These Interventions, the Patient was reevaluated and was found stable for discharge with evidence for COVID-19 infection mild dehydration.  Doubt need for hospitalization, advanced airway support, oxygen therapy at home.  CRITICAL CARE-no Performed by: Daleen Bo   Nursing Notes Reviewed/ Care Coordinated Applicable Imaging Reviewed Interpretation of Laboratory Data incorporated into ED treatment  The patient appears reasonably screened and/or stabilized for discharge and I doubt any other medical condition or other [facility] requiring further screening,  evaluation, or treatment in the ED at this time prior to discharge.  Plan: Home Medications-continue usual medication and use over-the-counter symptomatic treatments; Home Treatments-rest, fluids; return here if the recommended treatment, does not improve the symptoms; Recommended follow up-PCP, as needed       Final Clinical Impression(s) / ED Diagnoses Final diagnoses:  COVID-19 virus infection    Rx / DC Orders ED Discharge Orders    None       Daleen Bo, MD 12/25/19 [phone]

## 2019-12-21 NOTE — Discharge Instructions (Addendum)
It is important to get plenty of rest, eat regularly and take your usual medications.  You do appear mildly dehydrated.  Chest x-ray showed a pneumonia on the left side.  This is expected with Covid infections.  Continue to monitor your symptoms at home.  You can use Tylenol for fever, and Robitussin-DM for cough.  Use your prescription medication for nausea/vomiting, Zofran, as needed.  Contact your doctor if needed for problems.

## 2019-12-21 NOTE — ED Triage Notes (Signed)
Pt tested positive for COVID 4 days ago, has had diarrhea and nausea for the past 6 days. PCP prescribed immodium and zofran. Diarrhea has resolved since taking immodium, still has nausea. No cough, highest temp was 100.0. Not vaccinated for COVID. She feels dehydrated. Last tylenol was 5AM.

## 2019-12-22 ENCOUNTER — Ambulatory Visit: Payer: 59

## 2019-12-22 ENCOUNTER — Emergency Department (HOSPITAL_COMMUNITY)
Admission: EM | Admit: 2019-12-22 | Discharge: 2019-12-22 | Disposition: A | Discharge status: Home / Self Care | Payer: 59 | Attending: Emergency Medicine | Admitting: Emergency Medicine

## 2019-12-22 ENCOUNTER — Other Ambulatory Visit: Payer: Self-pay

## 2019-12-22 ENCOUNTER — Encounter (HOSPITAL_COMMUNITY): Payer: Self-pay

## 2019-12-22 DIAGNOSIS — U071 COVID-19: Secondary | ICD-10-CM

## 2019-12-22 DIAGNOSIS — R110 Nausea: Secondary | ICD-10-CM | POA: Insufficient documentation

## 2019-12-22 DIAGNOSIS — Z87891 Personal history of nicotine dependence: Secondary | ICD-10-CM | POA: Insufficient documentation

## 2019-12-22 LAB — BASIC METABOLIC PANEL
Anion gap: 10 (ref 5–15)
BUN: <5 mg/dL — ABNORMAL LOW (ref 8–23)
CO2: 25 mmol/L (ref 22–32)
Calcium: 8.4 mg/dL — ABNORMAL LOW (ref 8.9–10.3)
Chloride: 101 mmol/L (ref 98–111)
Creatinine, Ser: 0.76 mg/dL (ref 0.44–1.00)
GFR calc Af Amer: >60 mL/min (ref >60)
GFR calc non Af Amer: >60 mL/min (ref >60)
Glucose, Bld: 98 mg/dL (ref 70–99)
Potassium: 3.8 mmol/L (ref 3.5–5.1)
Sodium: 136 mmol/L (ref 135–145)

## 2019-12-22 MED ORDER — PROMETHAZINE HCL 25 MG RE SUPP
25.0000 mg | Freq: Four times a day (QID) | RECTAL | 0 refills | Status: DC | PRN
Start: 2019-12-22 — End: 2019-12-22

## 2019-12-22 MED ORDER — PROCHLORPERAZINE EDISYLATE 10 MG/2ML IJ SOLN
10.0000 mg | Freq: Once | INTRAMUSCULAR | Status: AC
  Administered 2019-12-22: 10 mg via INTRAVENOUS
  Filled 2019-12-22: qty 2

## 2019-12-22 MED ORDER — DIPHENHYDRAMINE HCL 50 MG/ML IJ SOLN
INTRAMUSCULAR | Status: AC
  Administered 2019-12-22: 12.5 mg via INTRAVENOUS
  Filled 2019-12-22: qty 1

## 2019-12-22 MED ORDER — SODIUM CHLORIDE 0.9 % IV BOLUS
1000.0000 mL | Freq: Once | INTRAVENOUS | Status: AC
  Administered 2019-12-22: 1000 mL via INTRAVENOUS

## 2019-12-22 MED ORDER — PROMETHAZINE HCL 25 MG RE SUPP
25.0000 mg | Freq: Four times a day (QID) | RECTAL | 0 refills | Status: DC | PRN
Start: 2019-12-22 — End: 2020-01-07

## 2019-12-22 MED ORDER — DIPHENHYDRAMINE HCL 50 MG/ML IJ SOLN: 12.5000 mg | Freq: Once | INTRAMUSCULAR | Status: AC

## 2019-12-22 NOTE — Discharge Instructions (Addendum)
Take the medications as needed for nausea.   Return for shortness of breath, worsening symptoms.  You should hear from our covid at home program team to help you with your illness in the next day or two

## 2019-12-22 NOTE — ED Notes (Signed)
Pt provided with faceshield ask mask. Case manager arranged COVID safe transport home.

## 2019-12-22 NOTE — ED Triage Notes (Signed)
Per EMs- patient is Covid + and was seen yesterday for the same. Patient c/o nausea. X 1 week.

## 2019-12-22 NOTE — Progress Notes (Signed)
TOC CM spoke to pt via phone. States she will be in the home alone. Her friends will bring her food and groceries. Explained COVID remote program. Pt agreeable to follow and requested transport. Completed [employer] with verbal consent received and cosigned with CSW, Ricquita. Waiver sent to [facility]. Form placed in medical record folder. ED provider updated.  Referral faxed to COVID remote health. [place], [place] ED TOC CM [phone]

## 2019-12-22 NOTE — ED Notes (Signed)
Pt verbalizes understanding of DC instructions. Pt belongings returned and is ambulatory out of ED.  

## 2019-12-22 NOTE — ED Provider Notes (Signed)
[facility] DEPT Provider Note   CSN: [id number] Arrival date & time: 12/22/19  1102     History Chief Complaint  Patient presents with  . Positive Covid    Yolanda Carroll is a 64 y.o. female.  HPI   Patient presented to the ED for evaluation of persistent nausea associated with COVID-19.  Patient was diagnosed with Covid 5 days ago.  Since that time she has had issues with persistent nausea.  She has not had much appetite.  She has not been able to eat or drink well.  Patient has not been vaccinated against Covid.  Patient's husband is also ill with Covid.  Patient denies any trouble with cough or shortness of breath.  She was in the emergency room yesterday for the same symptoms.  She was given a prescription for Zofran.  Patient states she did not feel any better.  She was feeling very weak today.  She had to call EMS.  Past Medical History:  Diagnosis Date  . Allergy   . Arthritis   . Cervical dysplasia   . Endometriosis   . Fatigue   . GERD (gastroesophageal reflux disease)   . IBS (irritable bowel syndrome)   . Migraine   . Osteoporosis 01/2019   T score -3.1  . Plantar fasciitis   . PONV (postoperative nausea and vomiting)    also difficult to wake up  . Recurrent vaginitis   . Reflux   . Scoliosis   . Wears glasses     Patient Active Problem List   Diagnosis Date Noted  . Anxiety 04/03/2018  . Generalized anxiety disorder 02/26/2018  . Chronic migraine without aura without status migrainosus, not intractable 01/29/2018  . Left ankle instability 11/05/2012  . Abnormality of gait 09/04/2012  . Plantar fasciitis of right foot 06/12/2012  . Scoliosis 06/12/2012  . Cervical disc disorder with radiculopathy of cervical region 04/17/2011  . Osteoporosis     Past Surgical History:  Procedure Laterality Date  . ANTERIOR CERVICAL DECOMP/DISCECTOMY FUSION  04/17/2011   Procedure: ANTERIOR CERVICAL DECOMPRESSION/DISCECTOMY FUSION 2  LEVELS;  Surgeon: Hosie Spangle;  Location: [facility];  Service: Neurosurgery;  Laterality: N/A;  Cervical five-six, six-seven anterior cervical decompression with fusion,  plating,  and bonegraft   . APPENDECTOMY  1978  . BREAST BIOPSY  07/13/2011   Procedure: BREAST BIOPSY WITH NEEDLE LOCALIZATION;  Surgeon: Rolm Bookbinder, MD;  Location: [facility];  Service: General;  Laterality: Right;  Right breast wire localization biopsy  . BREAST EXCISIONAL BIOPSY Right 2013  . BREAST SURGERY     Breast Bx-Benign  . CYSTOSCOPY    . GYNECOLOGIC CRYOSURGERY    . HERNIA REPAIR  08/02/1995   RIH  . LAPAROSCOPIC ENDOMETRIOSIS FULGURATION  1997  . PELVIC LAPAROSCOPY    . ROTATOR CUFF REPAIR     right 2002 left 2000     OB History    Gravida  0   Para      Term      Preterm      AB      Living        SAB      TAB      Ectopic      Multiple      Live Births              Family History  Problem Relation Age of Onset  . Cancer Father  lymphoma  . Other Mother        bipolar,reflux  . Bipolar disorder Mother   . Other Brother        sinus problems  . Cancer Maternal Aunt        uterine cancer  . Breast cancer Maternal Aunt        40's  . Diabetes Maternal Aunt   . Cancer Paternal Aunt        Colon cancer  . Breast cancer Cousin 60       Mat. 1st cousin    Social History   Tobacco Use  . Smoking status: Former Smoker    Quit date: 05/17/1983    Years since quitting: 36.6  . Smokeless tobacco: Never Used  Vaping Use  . Vaping Use: Never used  Substance Use Topics  . Alcohol use: Yes    Alcohol/week: 1.0 - 2.0 standard drink    Types: 1 - 2 Standard drinks or equivalent per week    Comment: socially, occasional  . Drug use: No    Home Medications Prior to Admission medications   Medication Sig Start Date End Date Taking? Authorizing Provider  AIMOVIG 140 MG/ML SOAJ INJECT 140 MG INTO SKIN EVERY 30 DAYS Patient taking  differently: Inject 140 mg into the skin every 30 (thirty) days.  08/29/19   Melvenia Beam, MD  ALPRAZolam Duanne Moron) 0.25 MG tablet TAKE ONE TABLET BY MOUTH TWICE A DAY Patient taking differently: Take 0.25 mg by mouth 2 (two) times daily.  07/07/19   Cottle, Billey Co., MD  Ascorbic Acid (VITAMIN C PO) Take 1 tablet by mouth daily. 500mg  capsule    [provider]  baclofen (LIORESAL) 10 MG tablet Take 10 mg by mouth as needed for muscle spasms.  12/24/15   [provider]  CALCIUM CITRATE PO Take 1 tablet by mouth daily.     [provider]  Cholecalciferol (VITAMIN D PO) Take 800 Units by mouth daily.     [provider]  conjugated estrogens (PREMARIN) vaginal cream Place 1 Applicatorful vaginally 2 (two) times a week. 09/08/19   Joseph Pierini, MD  denosumab (PROLIA) 60 MG/ML SOSY injection Inject 60 mg into the skin every 6 (six) months.    [provider]  DEPAKOTE ER 250 MG 24 hr tablet Take 1 tablet (250 mg total) by mouth daily. 11/28/19   Cottle, Billey Co., MD  FAMOTIDINE PO Take 20 mg by mouth daily.     [provider]  gabapentin (NEURONTIN) 100 MG capsule Take 200 mg by mouth daily. 10/09/19   [provider]  L-LYSINE PO Take 1 tablet by mouth daily.     [provider]  Lactobacillus (ACIDOPHILUS) 100 MG CAPS Take 100 mg by mouth daily.    [provider]  lidocaine (LIDODERM) 5 % Place 1 patch onto the skin as needed. Back pain    [provider]  magnesium 30 MG tablet Take 30 mg by mouth every other day.     [provider]  meclizine (ANTIVERT) 25 MG tablet Take 25 mg by mouth 3 (three) times daily as needed for dizziness.    [provider]  meloxicam (MOBIC) 7.5 MG tablet Take 7.5 mg by mouth daily.  11/08/14   [provider]  Multiple Vitamin (MULTIVITAMIN) tablet Take 1 tablet by mouth daily.    [provider]  nefazodone (SERZONE) 150 MG tablet  Take 1 tablet (150 mg total)  by mouth at bedtime. 11/25/19   Cottle, Billey Co., MD  ondansetron (ZOFRAN) 8 MG tablet Take 8 mg by mouth every 8 (eight) hours as needed for nausea or vomiting.    [provider]  pantoprazole (PROTONIX) 40 MG tablet Take 40 mg by mouth 2 (two) times daily.    [provider]  promethazine (PHENERGAN) 25 MG suppository Place 1 suppository (25 mg total) rectally every 6 (six) hours as needed for nausea or vomiting. 12/22/19   Dorie Rank, MD  Rimegepant Sulfate (NURTEC) 75 MG TBDP Take 75 mg by mouth daily as needed. For migraines. Take as close to onset of migraine as possible. One daily maximum. 08/05/19   Melvenia Beam, MD  Turmeric (QC TUMERIC COMPLEX) 500 MG CAPS Take 500 mg by mouth daily.    [provider]  valACYclovir (VALTREX) 500 MG tablet Take 500 mg by mouth 2 (two) times daily as needed (oral herpes).     [provider]  vitamin E 400 UNIT capsule Take 200 Units by mouth daily.     [provider]    Allergies    Penicillins, Atarax [hydroxyzine], Celexa [citalopram hydrobromide], Erythromycin, Flagyl [metronidazole], Nortriptyline, Prilosec [omeprazole], Requip [ropinirole], Tramadol, Darvocet [propoxyphene n-acetaminophen], and Percocet [oxycodone-acetaminophen]  Review of Systems   Review of Systems  All other systems reviewed and are negative.   Physical Exam Updated Vital Signs BP 127/60   Pulse 86   Temp 98.8 F (37.1 C)   Resp 16   SpO2 100%   Physical Exam Vitals and nursing note reviewed.  Constitutional:      General: She is not in acute distress.    Appearance: She is well-developed.  HENT:     Head: Normocephalic and atraumatic.     Right Ear: External ear normal.     Left Ear: External ear normal.  Eyes:     General: No scleral icterus.       Right eye: No discharge.        Left eye: No discharge.     Conjunctiva/sclera: Conjunctivae normal.  Neck:     Trachea: No  tracheal deviation.  Cardiovascular:     Rate and Rhythm: Normal rate and regular rhythm.  Pulmonary:     Effort: Pulmonary effort is normal. No respiratory distress.     Breath sounds: Normal breath sounds. No stridor. No wheezing or rales.  Abdominal:     General: Bowel sounds are normal. There is no distension.     Palpations: Abdomen is soft.     Tenderness: There is no abdominal tenderness. There is no guarding or rebound.  Musculoskeletal:        General: No tenderness.     Cervical back: Neck supple.  Skin:    General: Skin is warm and dry.     Findings: No rash.  Neurological:     Mental Status: She is alert.     Cranial Nerves: No cranial nerve deficit (no facial droop, extraocular movements intact, no slurred speech).     Sensory: No sensory deficit.     Motor: No abnormal muscle tone or seizure activity.     Coordination: Coordination normal.     ED Results / Procedures / Treatments   Labs (all labs ordered are listed, but only abnormal results are displayed) Labs Reviewed  BASIC METABOLIC PANEL - Abnormal; Notable for the following components:      Result Value   BUN <5 (*)    Calcium  8.4 (*)    All other components within normal limits    EKG None  Radiology DG Chest Port 1 View  Result Date: 12/21/2019 CLINICAL DATA:  Cough. Covid positive test 4 days ago. Diarrhea and nausea for 6 days. EXAM: PORTABLE CHEST 1 VIEW COMPARISON:  02/21/2012 FINDINGS: Lungs are well inflated. There is faint hazy density at the LEFT lung base possibly representing early infiltrate. Heart size is normal. No edema. Stable convex RIGHT rotoscoliosis. IMPRESSION: Possible early infiltrate at the LEFT lung base. Electronically Signed   By: Nolon Nations M.D.   On: 12/21/2019 09:38    Procedures Procedures (including critical care time)  Medications Ordered in ED Medications  sodium chloride 0.9 % bolus 1,000 mL (0 mLs Intravenous Stopped 12/22/19 1406)  prochlorperazine  (COMPAZINE) injection 10 mg (10 mg Intravenous Given 12/22/19 1318)  diphenhydrAMINE (BENADRYL) injection 12.5 mg (12.5 mg Intravenous Given 12/22/19 1347)    ED Course  I have reviewed the triage vital signs and the nursing notes.  Pertinent labs & imaging results that were available during my care of the patient were reviewed by me and considered in my medical decision making (see chart for details).  Clinical Course as of Dec 21 1504  Mon Dec 22, 2019  1400 Patient developed some akathisia's from the Compazine.  I have ordered a dose of Benadryl.   [JK]  1443 Electrolyte panel reassuring.  No signs of dehydration   [JK]    Clinical Course User Index [JK] Dorie Rank, MD   MDM Rules/Calculators/A&P                          Patient presented to the ED with complaints of persistent nausea.  Patient's metabolic panel does not show any signs of severe dehydration.  Patient is tolerating oral fluids.  She was given a dose of Compazine with some improvement.  Recommend continued supportive care at home.  I will give her prescription for Phenergan.  Case management was contacted and they will assist with the hospital at home program for Covid. Final Clinical Impression(s) / ED Diagnoses Final diagnoses:  COVID-19 virus infection    Rx / DC Orders ED Discharge Orders         Ordered    promethazine (PHENERGAN) 25 MG suppository  Every 6 hours PRN,   Status:  Discontinued     Reprint     12/22/19 1504    promethazine (PHENERGAN) 25 MG suppository  Every 6 hours PRN     Discontinue     12/22/19 1506           Dorie Rank, MD 12/22/19 1511

## 2019-12-24 ENCOUNTER — Other Ambulatory Visit (HOSPITAL_COMMUNITY): Payer: Self-pay | Admitting: Oncology

## 2019-12-24 ENCOUNTER — Telehealth (HOSPITAL_COMMUNITY): Payer: Self-pay | Admitting: Oncology

## 2019-12-24 DIAGNOSIS — U071 COVID-19: Secondary | ICD-10-CM

## 2019-12-24 NOTE — Telephone Encounter (Signed)
I connected by phone with Yolanda Carroll on 12/24/2019 at 6pm to discuss the potential use of an new treatment for mild to moderate COVID-19 viral infection in non-hospitalized patients.   This patient is a age/sex that meets the FDA criteria for Emergency Use Authorization of casirivimab\imdevimab.  Has a (+) direct SARS-CoV-2 viral test result 1. Has mild or moderate COVID-19  2. Is ? 65 years of age and weighs ? 40 kg 3. Is NOT hospitalized due to COVID-19 4. Is NOT requiring oxygen therapy or requiring an increase in baseline oxygen flow rate due to COVID-19 5. Is within 10 days of symptom onset 6. Has at least one of the high risk factor(s) for progression to severe COVID-19 and/or hospitalization as defined in EUA. ? Specific high risk criteria : Age   Symptom onset  12/16/2019   I have spoken and communicated the following to the patient or parent/caregiver:   1. FDA has authorized the emergency use of casirivimab\imdevimab for the treatment of mild to moderate COVID-19 in adults and pediatric patients with positive results of direct SARS-CoV-2 viral testing who are 85 years of age and older weighing at least 40 kg, and who are at high risk for progressing to severe COVID-19 and/or hospitalization.   2. The significant known and potential risks and benefits of casirivimab\imdevimab, and the extent to which such potential risks and benefits are unknown.   3. Information on available alternative treatments and the risks and benefits of those alternatives, including clinical trials.   4. Patients treated with casirivimab\imdevimab should continue to self-isolate and use infection control measures (e.g., wear mask, isolate, social distance, avoid sharing personal items, clean and disinfect "high touch" surfaces, and frequent handwashing) according to CDC guidelines.    5. The patient or parent/caregiver has the option to accept or refuse casirivimab\imdevimab .   After reviewing this  information with the patient, The patient agreed to proceed with receiving casirivimab\imdevimab infusion and will be provided a copy of the Fact sheet prior to receiving the infusion.Rulon Abide, AGNP-C [phone] ([place])

## 2019-12-25 ENCOUNTER — Ambulatory Visit (HOSPITAL_COMMUNITY)
Admission: RE | Admit: 2019-12-25 | Discharge: 2019-12-25 | Disposition: A | Discharge status: Home / Self Care | Payer: 59 | Source: Ambulatory Visit | Attending: Pulmonary Disease | Admitting: Pulmonary Disease

## 2019-12-25 DIAGNOSIS — U071 COVID-19: Secondary | ICD-10-CM | POA: Insufficient documentation

## 2019-12-25 MED ORDER — METHYLPREDNISOLONE SODIUM SUCC 125 MG IJ SOLR: 125.0000 mg | Freq: Once | INTRAMUSCULAR | Status: DC | PRN

## 2019-12-25 MED ORDER — ALBUTEROL SULFATE HFA 108 (90 BASE) MCG/ACT IN AERS: 2.0000 | INHALATION_SPRAY | Freq: Once | RESPIRATORY_TRACT | Status: DC | PRN

## 2019-12-25 MED ORDER — SODIUM CHLORIDE 0.9 % IV SOLN
1200.0000 mg | Freq: Once | INTRAVENOUS | Status: AC
  Administered 2019-12-25: 1200 mg via INTRAVENOUS
  Filled 2019-12-25: qty 400

## 2019-12-25 MED ORDER — SODIUM CHLORIDE 0.9 % IV SOLN: INTRAVENOUS | Status: DC | PRN

## 2019-12-25 MED ORDER — EPINEPHRINE 0.3 MG/0.3ML IJ SOAJ: 0.3000 mg | Freq: Once | INTRAMUSCULAR | Status: DC | PRN

## 2019-12-25 MED ORDER — DIPHENHYDRAMINE HCL 50 MG/ML IJ SOLN: 50.0000 mg | Freq: Once | INTRAMUSCULAR | Status: DC | PRN

## 2019-12-25 MED ORDER — FAMOTIDINE IN NACL 20-0.9 MG/50ML-% IV SOLN: 20.0000 mg | Freq: Once | INTRAVENOUS | Status: DC | PRN

## 2019-12-25 NOTE — Progress Notes (Signed)
  Diagnosis: COVID-19  Physician:  Dr. Asencion Noble  Procedure: Cov[facility] Infusion Clinic Med: casirivimab\imdevimab infusion - Provided patient with casirivimab\imdevimab fact sheet for patients, parents and caregivers prior to infusion.  Complications: No immediate complications noted.  Discharge: Discharged home   [facility] 12/25/2019

## 2019-12-25 NOTE — Discharge Instructions (Signed)

## 2020-01-01 ENCOUNTER — Ambulatory Visit
Admission: RE | Admit: 2020-01-01 | Discharge: 2020-01-01 | Disposition: A | Discharge status: Home / Self Care | Payer: 59 | Source: Ambulatory Visit | Attending: Family Medicine | Admitting: Family Medicine

## 2020-01-01 ENCOUNTER — Other Ambulatory Visit: Payer: Self-pay | Admitting: Family Medicine

## 2020-01-01 DIAGNOSIS — R1030 Lower abdominal pain, unspecified: Secondary | ICD-10-CM

## 2020-01-02 ENCOUNTER — Telehealth: Payer: Self-pay | Admitting: Psychiatry

## 2020-01-02 NOTE — Telephone Encounter (Signed)
[place] called to see if she can increase her Trazadone to 50 mg.?  [phone]

## 2020-01-02 NOTE — Telephone Encounter (Signed)
Patient made aware.

## 2020-01-02 NOTE — Telephone Encounter (Signed)
Please let her know she can increase trazodone to 50 mg nightly

## 2020-01-05 ENCOUNTER — Ambulatory Visit
Admission: RE | Admit: 2020-01-05 | Discharge: 2020-01-05 | Disposition: A | Discharge status: Home / Self Care | Payer: PRIVATE HEALTH INSURANCE | Source: Ambulatory Visit | Attending: Obstetrics and Gynecology | Admitting: Obstetrics and Gynecology

## 2020-01-05 ENCOUNTER — Other Ambulatory Visit: Payer: Self-pay

## 2020-01-05 DIAGNOSIS — Z1231 Encounter for screening mammogram for malignant neoplasm of breast: Secondary | ICD-10-CM

## 2020-01-07 ENCOUNTER — Other Ambulatory Visit: Payer: Self-pay

## 2020-01-07 ENCOUNTER — Encounter: Payer: Self-pay | Admitting: Psychiatry

## 2020-01-07 ENCOUNTER — Ambulatory Visit (INDEPENDENT_AMBULATORY_CARE_PROVIDER_SITE_OTHER): Payer: 59 | Admitting: Psychiatry

## 2020-01-07 ENCOUNTER — Other Ambulatory Visit: Payer: Self-pay | Admitting: Obstetrics and Gynecology

## 2020-01-07 DIAGNOSIS — F5105 Insomnia due to other mental disorder: Secondary | ICD-10-CM | POA: Diagnosis not present

## 2020-01-07 DIAGNOSIS — G43009 Migraine without aura, not intractable, without status migrainosus: Secondary | ICD-10-CM | POA: Diagnosis not present

## 2020-01-07 DIAGNOSIS — K219 Gastro-esophageal reflux disease without esophagitis: Secondary | ICD-10-CM | POA: Diagnosis not present

## 2020-01-07 DIAGNOSIS — F411 Generalized anxiety disorder: Secondary | ICD-10-CM

## 2020-01-07 DIAGNOSIS — R928 Other abnormal and inconclusive findings on diagnostic imaging of breast: Secondary | ICD-10-CM

## 2020-01-07 NOTE — Progress Notes (Signed)
Yolanda Carroll [id number] 14-Jun-1955 64 y.o.    Subjective:   Patient ID:  Yolanda Carroll is a 64 y.o. (DOB 1956/02/11) female.  Chief Complaint:  Chief Complaint  Patient presents with   Follow-up    Medication Management   Anxiety    Medication Management   Sleeping Problem    Anxiety Patient reports no confusion, decreased concentration, nervous/anxious behavior or suicidal ideas.     Yolanda Carroll presents to the office today for follow-up of anxiety and migraine.  seen in July 2020.  No meds were changed.  She requires name brand Depakote because the generic caused worsening headaches.  Retired November 14, 2018.  Was so burned out at work.  Helping with church projects and helping friends.  Focusing on her health and doing PT for back and hip.  No kids or gkids.  Helps care for 32 yo M-in-law.  H is also busy which helps.  Given a surprise retirement party.     Last seen January 2021.  Nefazodone is no longer manufactured and she had to wean off.  We also discussed the potential of weaning off Depakote because Aimovig had been very effective at managing her migraine headache.  She called August 11, 2019 stating that she did taper off of Depakote because she did not feel like she needed it any more.  Her last dose was July 23, 2019 but afterwards noticed heartburn and swallowing issues.  Her GI doctor added Pepcid 40 mg daily.  Her neurologist had indicated that Depakote can sometimes help with esophageal issues and she wondered if that was connected.  She was given the option to restart a lower dose and perhaps taper more slowly.  Off Depakote for a month.  She elected not to restart it.  08/19/2019 appointment the following is noted: Tinnitus and mild anxiety worse off the Depakote and with some mind racing esp in the AM.   Most is better. Reflux is worse off Depakote ER 250 and wondering if she should restart it.   Disc article on Depakote helping GERD by increasing  lower esophageal sphincter tone. Has travelled some with family events.  M-in-law 19 soon.   HA good with Aimovig. Don't do season changes well and more HA in Spring often. Sleep not great lately with leg cramps.  Started more exercise.  Started snoring and H is a light sleeper.  Meds help. Plan no med changes.  10/03/2019 phone call with patient asking to restart residual nefazodone that she has on hand.  She felt that it helped back pain and she is experiencing more back pain recently.  She is aware that it is no longer manufactured to our knowledge and once she runs out it will not be available.  She indicated she had an off to take half of 150 mg nefazodone tablet for about 2 to 3 months and would like to do so.  11/05/2019 appointment with the following noted:  Appt moved earlier DT the following. Had stopped Depakote and nefazadone and reflux got worse.  Restarting Depakote didn't help much.  Restarted nefazodaone and reflux better right away.  May not need Depakote but doesn't want to change.  Can't exercise DT back pain since Jan ans worse since March.  Getting new doctor.  More depressed and anxious and sleep problems DT back pain.   Usually only 0.25 mg alprazolam at night. Plan: has to stop nefazodone when it runs out bc no longer manufactured.  Therefore will  try trazodone in it's place.  01/07/20 appt with the following noted: She and H both got Covid.  Was pretty sick for 10 days.  Lost taste.  Had little resp stuff but severe diarrhea and nausea and couldn't keep fluids down.  No residual sx. H had resp sx with pneumonia and had to get O2.  Hosp for 3 days. Still have some nefazodone and nursing it along. Tried trazodone 50 HS for 2 nights and didn't sleep well and had a HA  Nefazodone and Depakote both helped the GERD.  She has enough nefazadone for about 6 weeks.  Patient denies difficulty with sleep initiation with Xanax.. Denies appetite disturbance.  Patient reports that  energy and motivation have been good.  Patient denies any difficulty with concentration.  Patient denies any suicidal ideation. More depressed and tearful with pain.  Insomnia managed with  With alprazolam.  Now not good time to taper.  No sig anxiety daytime usually.   H running for office.    Saw neurologist for Migraine and had problems with the meds RX.  Aimovig has helped..   Past Psychiatric Medication Trials: Failed multiple other antidepressants, nefazodone & Depakote for migraine,  citalopram palpitations ? Ever taken trazodone  Review of Systems:  Review of Systems  Gastrointestinal: Positive for abdominal pain.  Musculoskeletal: Positive for arthralgias and back pain.  Neurological: Negative for tremors, weakness and headaches.  Psychiatric/Behavioral: Negative for agitation, behavioral problems, confusion, decreased concentration, dysphoric mood, hallucinations, self-injury, sleep disturbance and suicidal ideas. The patient is not nervous/anxious and is not hyperactive.     Medications: I have reviewed the patient's current medications.  Current Outpatient Medications  Medication Sig Dispense Refill   AIMOVIG 140 MG/ML SOAJ INJECT 140 MG INTO SKIN EVERY 30 DAYS (Patient taking differently: Inject 140 mg into the skin every 30 (thirty) days. ) 3 pen 3   ALPRAZolam (XANAX) 0.25 MG tablet TAKE ONE TABLET BY MOUTH TWICE A DAY (Patient taking differently: Take 0.25 mg by mouth 2 (two) times daily. ) 60 tablet 5   Ascorbic Acid (VITAMIN C PO) Take 1 tablet by mouth daily. 557m capsule     baclofen (LIORESAL) 10 MG tablet Take 10 mg by mouth as needed for muscle spasms.      CALCIUM CITRATE PO Take 1 tablet by mouth daily.      Cholecalciferol (VITAMIN D PO) Take 800 Units by mouth daily.      conjugated estrogens (PREMARIN) vaginal cream Place 1 Applicatorful vaginally 2 (two) times a week. 42.5 g 5   denosumab (PROLIA) 60 MG/ML SOSY injection Inject 60 mg into the skin  every 6 (six) months.     DEPAKOTE ER 250 MG 24 hr tablet Take 1 tablet (250 mg total) by mouth daily. 90 tablet 1   FAMOTIDINE PO Take 20 mg by mouth daily.      gabapentin (NEURONTIN) 100 MG capsule Take 200 mg by mouth daily.     L-LYSINE PO Take 1 tablet by mouth daily.      Lactobacillus (ACIDOPHILUS) 100 MG CAPS Take 100 mg by mouth daily.     lidocaine (LIDODERM) 5 % Place 1 patch onto the skin as needed. Back pain     magnesium 30 MG tablet Take 30 mg by mouth every other day.      meclizine (ANTIVERT) 25 MG tablet Take 25 mg by mouth 3 (three) times daily as needed for dizziness.     Multiple Vitamin (MULTIVITAMIN) tablet Take 1  tablet by mouth daily.     nefazodone (SERZONE) 150 MG tablet Take 1 tablet (150 mg total) by mouth at bedtime. (Patient taking differently: Take 75 mg by mouth at bedtime. ) 90 tablet 0   pantoprazole (PROTONIX) 40 MG tablet Take 40 mg by mouth 2 (two) times daily.     Rimegepant Sulfate (NURTEC) 75 MG TBDP Take 75 mg by mouth daily as needed. For migraines. Take as close to onset of migraine as possible. One daily maximum. 4 tablet 0   Turmeric (QC TUMERIC COMPLEX) 500 MG CAPS Take 500 mg by mouth daily.     valACYclovir (VALTREX) 500 MG tablet Take 500 mg by mouth 2 (two) times daily as needed (oral herpes).      vitamin E 400 UNIT capsule Take 200 Units by mouth daily.      No current facility-administered medications for this visit.    Medication Side Effects: None  Allergies:  Allergies  Allergen Reactions   Penicillins Other (See Comments)    Seizure as a child   Atarax [Hydroxyzine] Other (See Comments)    Headache, depression   Celexa [Citalopram Hydrobromide] Other (See Comments)    Chest pain   Erythromycin      Upset stomache   Flagyl [Metronidazole]     Dizzy and increased heart rate   Nortriptyline Itching   Prilosec [Omeprazole]     Abdominal pain   Requip [Ropinirole]     Made sx worse   Tramadol  Itching   Darvocet [Propoxyphene N-Acetaminophen] Itching   Percocet [Oxycodone-Acetaminophen] Itching    Past Medical History:  Diagnosis Date   Allergy    Arthritis    Cervical dysplasia    Endometriosis    Fatigue    GERD (gastroesophageal reflux disease)    IBS (irritable bowel syndrome)    Migraine    Osteoporosis 01/2019   T score -3.1   Plantar fasciitis    PONV (postoperative nausea and vomiting)    also difficult to wake up   Recurrent vaginitis    Reflux    Scoliosis    Wears glasses     Family History  Problem Relation Age of Onset   Cancer Father        lymphoma   Other Mother        bipolar,reflux   Bipolar disorder Mother    Other Brother        sinus problems   Cancer Maternal Aunt        uterine cancer   Breast cancer Maternal Aunt        40's   Diabetes Maternal Aunt    Cancer Paternal Aunt        Colon cancer   Breast cancer Cousin 20       Mat. 1st cousin    Social History   Socioeconomic History   Marital status: Married    Spouse name: Fritz Pickerel   Number of children: 0   Years of education: 16   Highest education level: Not on file  Occupational History    Comment: [employer]  Tobacco Use   Smoking status: Former Smoker    Quit date: 05/17/1983    Years since quitting: 36.6   Smokeless tobacco: Never Used  Vaping Use   Vaping Use: Never used  Substance and Sexual Activity   Alcohol use: Yes    Alcohol/week: 1.0 - 2.0 standard drink    Types: 1 - 2 Standard drinks or equivalent per  week    Comment: socially, occasional   Drug use: No   Sexual activity: Yes    Birth control/protection: Post-menopausal    Comment: intercourse age 14 , sexual partners more than 5  Other Topics Concern   Not on file  Social History Narrative   Pt is married, no children.  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    Caffeine- very little.   Right handed   Lives at home with  her husband   Social Determinants of Health   Financial Resource Strain:    Difficulty of Paying Living Expenses: Not on file  Food Insecurity:    Worried About Charity fundraiser in the Last Year: Not on file   [employer] of Food in the Last Year: Not on file  Transportation Needs:    Lack of Transportation (Medical): Not on file   Lack of Transportation (Non-Medical): Not on file  Physical Activity:    Days of Exercise per Week: Not on file   Minutes of Exercise per Session: Not on file  Stress:    Feeling of Stress : Not on file  Social Connections:    Frequency of Communication with Friends and Family: Not on file   Frequency of Social Gatherings with Friends and Family: Not on file   Attends Religious Services: Not on file   Active Member of Clubs or Organizations: Not on file   Attends Archivist Meetings: Not on file   Marital Status: Not on file  Intimate Partner Violence:    Fear of Current or Ex-Partner: Not on file   Emotionally Abused: Not on file   Physically Abused: Not on file   Sexually Abused: Not on file    Past Medical History, Surgical history, Social history, and Family history were reviewed and updated as appropriate.   Please see review of systems for further details on the patient's review from today.   Objective:   Physical Exam:  There were no vitals taken for this visit.  Physical Exam Neurological:     Mental Status: She is alert and oriented to person, place, and time.     Cranial Nerves: No dysarthria.  Psychiatric:        Attention and Perception: Attention and perception normal.        Mood and Affect: Mood is anxious and depressed. Affect is not tearful.        Speech: Speech normal.        Behavior: Behavior is cooperative.        Thought Content: Thought content normal. Thought content is not paranoid or delusional. Thought content does not include homicidal or suicidal ideation. Thought content does not  include homicidal or suicidal plan.        Cognition and Memory: Cognition and memory normal.        Judgment: Judgment normal.     Comments: Insight intact      Lab Review:     Component Value Date/Time   NA 136 12/22/2019 1230   K 3.8 12/22/2019 1230   CL 101 12/22/2019 1230   CO2 25 12/22/2019 1230   GLUCOSE 98 12/22/2019 1230   BUN <5 (L) 12/22/2019 1230   CREATININE 0.76 12/22/2019 1230   CALCIUM 8.4 (L) 12/22/2019 1230   PROT 7.6 12/21/2019 0949   ALBUMIN 4.3 12/21/2019 0949   AST 24 12/21/2019 0949   ALT 24 12/21/2019 0949   ALKPHOS 42 12/21/2019 0949   BILITOT 0.4 12/21/2019 0949  GFRNONAA >60 12/22/2019 1230   GFRAA >60 12/22/2019 1230       Component Value Date/Time   WBC 3.4 (L) 12/21/2019 0949   RBC 3.95 12/21/2019 0949   HGB 12.5 12/21/2019 0949   HCT 37.0 12/21/2019 0949   PLT 160 12/21/2019 0949   MCV 93.7 12/21/2019 0949   MCH 31.6 12/21/2019 0949   MCHC 33.8 12/21/2019 0949   RDW 12.6 12/21/2019 0949   LYMPHSABS 0.7 12/21/2019 0949   MONOABS 0.6 12/21/2019 0949   EOSABS 0.0 12/21/2019 0949   BASOSABS 0.0 12/21/2019 0949    Normal liver enzymes in September.  No results found for: POCLITH, LITHIUM   No results found for: PHENYTOIN, PHENOBARB, VALPROATE, CBMZ   .res Assessment: Plan:    Jeanifer was seen today for follow-up, anxiety and sleeping problem.  Diagnoses and all orders for this visit:  Generalized anxiety disorder  Insomnia due to mental condition  Migraine without aura and without status migrainosus, not intractable  Gastroesophageal reflux disease without esophagitis  Greater than 50% of 30 min non face to face time with patient was spent on counseling and coordination of care. We discussed the following.  We discussed the short-term risks associated with benzodiazepines including sedation and increased fall risk among others.  Discussed long-term side effect risk including dependence, potential withdrawal symptoms, and the  potential eventual dose-related risk of dementia. Disc newer studies that cast in doubt the relationship with dementia. Taper Xanax by 1/4 of 0.25 mg daily per month if she chooses and when she chooses to do so.   Nefazodone helped the reflux and she wants to take it as long as possible.  She didn't have withdrawal off nefazodone. When she runs out then start trazodone.  If that fails then use Viibryd.  Disc SE and alternatives.   Rec try trazodone again but use 100 mg and see if sleeping better prevents the AM HA.  If HA recurs then viibryd. Starter kit.  Depakote didn't help as much as expected and she might stop it.   Disc way to wean off nefazodone bc she may try this first.  This was a 30-minute appointment  FU  3 mos  Lynder Parents, MD, DFAPA  Please see After Visit Summary for patient specific instructions.  Future Appointments  Date Time Provider [place]  01/21/2020 10:50 AM [facility] DIAG [place] 2 [facility]  01/21/2020 11:00 AM [facility] [place] 2 [facility]  05/11/2020  1:30 PM [facility] NURSE [facility] None  08/18/2020  4:00 PM Cottle, Billey Co., MD [facility] None  12/02/2020  9:20 AM Philemon Kingdom, MD [facility] None    No orders of the defined types were placed in this encounter.     -------------------------------

## 2020-01-21 ENCOUNTER — Ambulatory Visit: Payer: PRIVATE HEALTH INSURANCE

## 2020-01-21 ENCOUNTER — Other Ambulatory Visit: Payer: Self-pay

## 2020-01-21 ENCOUNTER — Ambulatory Visit
Admission: RE | Admit: 2020-01-21 | Discharge: 2020-01-21 | Disposition: A | Discharge status: Home / Self Care | Payer: PRIVATE HEALTH INSURANCE | Source: Ambulatory Visit | Attending: Obstetrics and Gynecology | Admitting: Obstetrics and Gynecology

## 2020-01-21 DIAGNOSIS — R928 Other abnormal and inconclusive findings on diagnostic imaging of breast: Secondary | ICD-10-CM

## 2020-02-03 ENCOUNTER — Encounter: Payer: Self-pay | Admitting: Internal Medicine

## 2020-02-03 NOTE — Progress Notes (Signed)
Received labs from PCP, from 02/02/2020: -Hemoglobin normal at 12.4 (12-16) -CMP normal with a glucose of 88, BUN/creatinine 9/0.79, GFR 73, corrected calcium 8.74 (8.6-10.3) -Lipids: 194/136/52/118 -TSH 2.53 -Vitamin D 68.8

## 2020-02-09 ENCOUNTER — Other Ambulatory Visit: Payer: Self-pay | Admitting: Psychiatry

## 2020-02-18 ENCOUNTER — Telehealth: Payer: Self-pay | Admitting: Psychiatry

## 2020-02-18 NOTE — Telephone Encounter (Signed)
My notes indicated that if this happened she should try Viibryd.  Suggest she pick up samples and use sample pack but cut dose by 50%. That is start with 1/2 of 10 mg daily for 1 week then increase to 10 mg or 1/2 of 20 mg tablet with at least 350 calories.

## 2020-02-18 NOTE — Telephone Encounter (Signed)
Please review

## 2020-02-18 NOTE — Telephone Encounter (Signed)
Yolanda Carroll called to report that she is experiencing high anxiety.  It is causing chest discomfort and breathing problems. Has appt 11/18 and is on the wait list but needs to discuss how to relief this anxiety.  Please call.

## 2020-02-19 NOTE — Telephone Encounter (Signed)
Rtc to patient, she does have the [place] and does remember that discussion. She has not been able to start that because she received a steroid injection a couple weeks ago and caused her stomach to be upset. She didn't want to start it knowing that's a side effect. She feels like the steroid injection may have worsened all her symptoms. She did try the trazodone but it was too much for her, even taking a 1/2 tablet. She does have some serzone left and has been taking that until this improves. Also taking Xanax as needed during day if needed. Hoping all of this will improve soon. Informed her I would update Dr. Clovis Pu with her information.

## 2020-02-23 ENCOUNTER — Ambulatory Visit: Payer: PRIVATE HEALTH INSURANCE | Admitting: Internal Medicine

## 2020-02-23 ENCOUNTER — Encounter: Payer: Self-pay | Admitting: Internal Medicine

## 2020-02-23 ENCOUNTER — Ambulatory Visit (INDEPENDENT_AMBULATORY_CARE_PROVIDER_SITE_OTHER): Payer: PRIVATE HEALTH INSURANCE | Admitting: Internal Medicine

## 2020-02-23 ENCOUNTER — Other Ambulatory Visit: Payer: Self-pay

## 2020-02-23 VITALS — BP 114/60 | HR 70 | Ht 66.0 in | Wt 122.0 lb

## 2020-02-23 DIAGNOSIS — M546 Pain in thoracic spine: Secondary | ICD-10-CM | POA: Diagnosis not present

## 2020-02-23 DIAGNOSIS — G8929 Other chronic pain: Secondary | ICD-10-CM

## 2020-02-23 DIAGNOSIS — M810 Age-related osteoporosis without current pathological fracture: Secondary | ICD-10-CM

## 2020-02-23 NOTE — Progress Notes (Signed)
Patient ID: Yolanda Carroll, female   DOB: [birth year], 64 y.o.   MRN: [id number]   This visit occurred during the SARS-CoV-2 public health emergency.  Safety protocols were in place, including screening questions prior to the visit, additional usage of staff PPE, and extensive cleaning of exam room while observing appropriate contact time as indicated for disinfecting solutions.   HPI  Yolanda Carroll is a 64 y.o.-year-old female, initially referred by Dr. Phineas Real, presenting for follow-up for osteoporosis.  Last visit 3 months ago.  She had Covid19 in 12/2019 with GI sxs >> became very dehydrated, lost 10 lbs >> she still does not feel back to normal.   She has anxiety >> sees Dr. Clovis Pu.  She has steroid inj's in back recently due to significant pain.  She was diagnosed with osteoporosis in early 2000.  Reviewed patient's DXA scan reports: Date L1-L4 T score FN T score 33% distal Radius (left)  Ultra distal radius (left)  02/11/2019 ([facility]) N/a due to scoliosis RFN: -3.1 (-8.2%*) LFN: -2.4 (-4.7%)  -0.1 (-9.6%*)  0.0  02/06/2017 ([facility]) -0.3 (moderate scoliosis) RFN: -2.6 LFN: -2.2  +1.1 -0.1  12/29/2014 -0.5 (moderate scoliosis) RFN: -2.6 LFN: -1.7 n/a n/a  12/12/2012 -0.5 (moderate scoliosis) RFN: -2.4 LFN: -1.9 n/a n/a  10/04/2010 n/a RFN: -2.3 LFN: -1.8 n/a n/a   She denies dizziness/orthostasis/poor vision.  No falls or fractures since last visit.  She had one episode of vertigo for 2 weeks in 2018.   She has a history of steroid use: P.o. and IM for headaches, intraspinal for OA.  Reviewed previous osteoporosis treatments: - Actonel in 2006-2007 - Forteo for 2 years in 2008 - Boniva in 2010 - HRT: Estradiol 0.5, Provera 2.5 - now off - Prolia -05/02/2019, 11/11/2019.  She did have increased back pain after starting Prolia and she believed that these may be related.  She does have a history of back pain and right hip pain when walking, but in the past they were  responsive to steroids and not persisting long-term, but now she has been having pain for almost a whole year.  She was seen physical therapy for osteoarthritis in the past.  She is now in PT for back pain at [facility].  Received labs from PCP, from 02/02/2020: -Hemoglobin normal at 12.4 (12-16) -Lipids: 194/136/52/118 -TSH 2.53  No history of vitamin D deficiency: 02/02/2020: Vitamin D 68.8 Lab Results  Component Value Date   VD25OH 49.3 12/04/2019   VD25OH 70.27 03/04/2019   VD25OH 55 03/13/2017  02/11/2018: Vit D 61.9 04/30/2017: vit D 53.4  She is on: - calcium citrate chewables >> 200 mg 1-2 a day - Vitamin D + MVI >> approximately 2000 units daily (15,000 units a week)  No weightbearing exercises.  She cannot walk due to back pain.  She is not taking high vitamin A doses.  Menopause was at 89s y/o.   Pt does have a FH of osteoporosis in mother, who had a hip fracture.  No history of kidney stones or hyperparathyroidism: 02/02/2020: corrected calcium 8.74 (8.6-10.3) Lab Results  Component Value Date   CALCIUM 8.4 (L) 12/22/2019   CALCIUM 8.3 (L) 12/21/2019   CALCIUM 9.4 12/04/2019   CALCIUM 9.5 03/04/2019   CALCIUM 9.5 02/21/2012   CALCIUM 9.8 07/10/2011  02/11/2018: Ca normal  No history of thyrotoxicosis: 02/02/2020: TSH 2.53 02/11/2018: TSH 2.79 05/01/2017: TSH 1.71 04/28/2013: TSH 2.063 No results found for: TSH   No CKD. Last BUN/Cr: 02/02/2020:  CMP normal with a glucose of 88, BUN/creatinine 9/0.79, GFR 73 Lab Results  Component Value Date   BUN <5 (L) 12/22/2019   CREATININE 0.76 12/22/2019  02/11/2018: CMP normal, with glucose 76, BUN/creatinine 12/0.86 05/01/2017: 15/0.82 04/2013: 14/0.84  On Depakote for migraines.  ROS: Constitutional: no weight gain/no weight loss, + fatigue, no subjective hyperthermia, no subjective hypothermia Eyes: no blurry vision, no xerophthalmia ENT: no sore throat, no nodules palpated in neck, no dysphagia,  no odynophagia, no hoarseness Cardiovascular: no CP/no SOB/no palpitations/no leg swelling Respiratory: no cough/no SOB/no wheezing Gastrointestinal: no N/no V/no D/no C/no acid reflux Musculoskeletal: + muscle aches/+ joint aches Skin: no rashes, no hair loss Neurological: no tremors/no numbness/no tingling/no dizziness  I reviewed pt's medications, allergies, PMH, social hx, family hx, and changes were documented in the history of present illness. Otherwise, unchanged from my initial visit note.  Past Medical History:  Diagnosis Date  . Allergy   . Arthritis   . Cervical dysplasia   . Endometriosis   . Fatigue   . GERD (gastroesophageal reflux disease)   . IBS (irritable bowel syndrome)   . Migraine   . Osteoporosis 01/2019   T score -3.1  . Plantar fasciitis   . PONV (postoperative nausea and vomiting)    also difficult to wake up  . Recurrent vaginitis   . Reflux   . Scoliosis   . Wears glasses    Past Surgical History:  Procedure Laterality Date  . ANTERIOR CERVICAL DECOMP/DISCECTOMY FUSION  04/17/2011   Procedure: ANTERIOR CERVICAL DECOMPRESSION/DISCECTOMY FUSION 2 LEVELS;  Surgeon: Hosie Spangle;  Location: [facility];  Service: Neurosurgery;  Laterality: N/A;  Cervical five-six, six-seven anterior cervical decompression with fusion,  plating,  and bonegraft   . APPENDECTOMY  1978  . BREAST BIOPSY  07/13/2011   Procedure: BREAST BIOPSY WITH NEEDLE LOCALIZATION;  Surgeon: Rolm Bookbinder, MD;  Location: [facility];  Service: General;  Laterality: Right;  Right breast wire localization biopsy  . BREAST EXCISIONAL BIOPSY Right 2013  . BREAST SURGERY     Breast Bx-Benign  . CYSTOSCOPY    . GYNECOLOGIC CRYOSURGERY    . HERNIA REPAIR  08/02/1995   [facility]  . LAPAROSCOPIC ENDOMETRIOSIS FULGURATION  1997  . PELVIC LAPAROSCOPY    . ROTATOR CUFF REPAIR     right 2002 left 2000   Social History   Socioeconomic History  . Marital status: Married     Spouse name: Fritz Pickerel  . Number of children: 0  . 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 Smoking status: Former Smoker    Last attempt to quit: 05/17/1983    Years since quitting: 34.0  . Smokeless tobacco: Never Used  Substance and Sexual Activity  . Alcohol use: Yes    Alcohol/week: 0.6 oz    Types: 1-2 Standard drinks or equivalent per week    Comment: socially, occasional  . Drug use: No  . Sexual activity: Yes    Birth control/protection: Post-menopausal    Comment: intercourse age 15 , sexual partners more than 5  Other Topics Concern  . Not on file  Social History Narrative   Pt is married, no children.  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    Caffeine- very little.   Current Outpatient Medications on File Prior to Visit  Medication Sig Dispense Refill  . AIMOVIG 140  MG/ML SOAJ INJECT 140 MG INTO SKIN EVERY 30 DAYS (Patient taking differently: Inject 140 mg into the skin every 30 (thirty) days. ) 3 pen 3  . ALPRAZolam (XANAX) 0.25 MG tablet TAKE ONE TABLET BY MOUTH TWICE A DAY 60 tablet 1  . Ascorbic Acid (VITAMIN C PO) Take 1 tablet by mouth daily. 500mg  capsule    . baclofen (LIORESAL) 10 MG tablet Take 10 mg by mouth as needed for muscle spasms.     Marland Kitchen CALCIUM CITRATE PO Take 1 tablet by mouth daily.     . Cholecalciferol (VITAMIN D PO) Take 800 Units by mouth daily.     Marland Kitchen conjugated estrogens (PREMARIN) vaginal cream Place 1 Applicatorful vaginally 2 (two) times a week. 42.5 g 5  . denosumab (PROLIA) 60 MG/ML SOSY injection Inject 60 mg into the skin every 6 (six) months.    . DEPAKOTE ER 250 MG 24 hr tablet Take 1 tablet (250 mg total) by mouth daily. 90 tablet 1  . FAMOTIDINE PO Take 20 mg by mouth daily.     Marland Kitchen gabapentin (NEURONTIN) 100 MG capsule Take 200 mg by mouth daily.    Marland Kitchen L-LYSINE PO Take 1 tablet by mouth daily.     . Lactobacillus  (ACIDOPHILUS) 100 MG CAPS Take 100 mg by mouth daily.    Marland Kitchen lidocaine (LIDODERM) 5 % Place 1 patch onto the skin as needed. Back pain    . magnesium 30 MG tablet Take 30 mg by mouth every other day.     . meclizine (ANTIVERT) 25 MG tablet Take 25 mg by mouth 3 (three) times daily as needed for dizziness.    . Multiple Vitamin (MULTIVITAMIN) tablet Take 1 tablet by mouth daily.    . nefazodone (SERZONE) 150 MG tablet Take 1 tablet (150 mg total) by mouth at bedtime. (Patient taking differently: Take 75 mg by mouth at bedtime. ) 90 tablet 0  . pantoprazole (PROTONIX) 40 MG tablet Take 40 mg by mouth 2 (two) times daily.    . Rimegepant Sulfate (NURTEC) 75 MG TBDP Take 75 mg by mouth daily as needed. For migraines. Take as close to onset of migraine as possible. One daily maximum. 4 tablet 0  . Turmeric (QC TUMERIC COMPLEX) 500 MG CAPS Take 500 mg by mouth daily.    . valACYclovir (VALTREX) 500 MG tablet Take 500 mg by mouth 2 (two) times daily as needed (oral herpes).     . vitamin E 400 UNIT capsule Take 200 Units by mouth daily.      No current facility-administered medications on file prior to visit.   Allergies  Allergen Reactions  . Penicillins Other (See Comments)    Seizure as a child  . Atarax [Hydroxyzine] Other (See Comments)    Headache, depression  . Celexa [Citalopram Hydrobromide] Other (See Comments)    Chest pain  . Erythromycin      Upset stomache  . Flagyl [Metronidazole]     Dizzy and increased heart rate  . Nortriptyline Itching  . Prilosec [Omeprazole]     Abdominal pain  . Requip [Ropinirole]     Made sx worse  . Tramadol Itching  . Darvocet [Propoxyphene N-Acetaminophen] Itching  . Percocet [Oxycodone-Acetaminophen] Itching   Family History  Problem Relation Age of Onset  . Cancer Father        lymphoma  . Other Mother        bipolar,reflux  . Bipolar disorder Mother   . Other Brother  sinus problems  . Cancer Maternal Aunt        uterine cancer   . Breast cancer Maternal Aunt        40's  . Diabetes Maternal Aunt   . Cancer Paternal Aunt        Colon cancer  . Breast cancer Cousin 27       Mat. 1st cousin   PE: BP 114/60 (BP Location: Right Arm, Patient Position: Sitting)   Pulse 70   Ht 5\' 6"  (1.676 m)   Wt 122 lb (55.3 kg)   SpO2 98%   BMI 19.69 kg/m  Wt Readings from Last 3 Encounters:  02/23/20 122 lb (55.3 kg)  12/21/19 130 lb (59 kg)  12/04/19 131 lb (59.4 kg)   Constitutional:  Normal weight, in NAD Eyes: PERRLA, EOMI, no exophthalmos ENT: moist mucous membranes, no thyromegaly, no cervical lymphadenopathy Cardiovascular: RRR, No MRG Respiratory: CTA B Gastrointestinal: abdomen soft, NT, ND, BS+ Musculoskeletal: + Significant scoliosis, strength intact in all 4 Skin: moist, warm, no rashes Neurological: no tremor with outstretched hands, DTR normal in all 4  Assessment: 1. Osteoporosis  2. Back pain  Plan: 1. Osteoporosis -Likely age-related + postmenopausal she also has a family history of osteoporosis -Reviewed her bone density reports from 01/2017 at 01/2019 and it appears that both her femoral neck T-scores have decreased while only the right femoral neck decrease was significant.  She also had decrease in bone density at the level of the radius.  Her spine could not be analyzed on the latest bone density scan due to scoliosis.  After the results returned, she agreed to start Prolia and got her first injection in 04/2019.  She did complain of back pain, but she was complaining about this at last visit, also, before starting Prolia.  We discussed at that time that it was unlikely to have back pain after starting Prolia in the absence of a fracture.  However, she continues to have back pain, I suggested to switch to Reclast.  She got her last Prolia injection at the end of 10/2019 and the plan was to start Reclast within the next 2 months -However, she scheduled this appointment to discuss about possibly  stopping all osteoporosis medications.  I strongly advised her against doing so, since, in that case, she may have an abrupt decrease in bone density and a much higher risk for fracture.  I recommended to continue with Prolia, especially since her main concern is not introducing new substances in her body.  She agrees to continue with Prolia.  However, she will change her insurance after the first of the year, so, we need to give her the next injection on 12/30 or 05/14/2020 -At today's visit, we reviewed her calcium and vitamin D supplement.  Her vitamin D level was normal last month, on 2000 units daily -She is taking calcium 200 mg 1-2 times a day.  She is also trying to eat a low acid diet. -Also, her kidney function was normal in 01/2020 -She has significant back pain so it is difficult for her to do weightbearing exercises.  Before walking is difficult due to the pain. -I will see the patient back in 1 year.  2.  Back pain -She has significant scoliosis and this is most likely the cause of her back pain; we discussed that is unlikely that the pain is caused by Prolia -Back pain started to be significant after her first Prolia injection, but was not exacerbated after  the second. -She continues physical therapy -We discussed about continuing Prolia for now  Philemon Kingdom, MD PhD [facility]

## 2020-02-23 NOTE — Patient Instructions (Signed)
Let's continue with Prolia as discussed.  Please come back for a follow-up appointment in 1 year.

## 2020-03-01 NOTE — Telephone Encounter (Signed)
Completed Aimovig PA on Cover My Meds. Key: [id number]. Awaiting determination from Rx Benefits. I used the information on pt's insurance card and the form populated on Cover My Meds was Rx Benefits.

## 2020-03-04 ENCOUNTER — Encounter: Payer: Self-pay | Admitting: Psychiatry

## 2020-03-05 NOTE — Telephone Encounter (Signed)
Per Cover My Meds, Aimovig has been approved through 03/02/2021.

## 2020-03-12 ENCOUNTER — Telehealth: Payer: Self-pay | Admitting: Psychiatry

## 2020-03-12 NOTE — Telephone Encounter (Signed)
Pt reporting her orthopedic doctor gave her tramadol for pain. Checking to see if ok to take with meds she is taking from [facility].  Depakote, Serzone & Alprazolam. Contact @ [phone]

## 2020-03-12 NOTE — Telephone Encounter (Signed)
Please review

## 2020-03-15 ENCOUNTER — Ambulatory Visit (INDEPENDENT_AMBULATORY_CARE_PROVIDER_SITE_OTHER): Payer: PRIVATE HEALTH INSURANCE | Admitting: Cardiology

## 2020-03-15 ENCOUNTER — Other Ambulatory Visit: Payer: Self-pay

## 2020-03-15 ENCOUNTER — Encounter: Payer: Self-pay | Admitting: Cardiology

## 2020-03-15 VITALS — BP 118/70 | HR 66 | Ht 68.0 in | Wt 120.8 lb

## 2020-03-15 DIAGNOSIS — Z01812 Encounter for preprocedural laboratory examination: Secondary | ICD-10-CM

## 2020-03-15 DIAGNOSIS — R0609 Other forms of dyspnea: Secondary | ICD-10-CM | POA: Diagnosis not present

## 2020-03-15 DIAGNOSIS — R072 Precordial pain: Secondary | ICD-10-CM | POA: Diagnosis not present

## 2020-03-15 DIAGNOSIS — R0602 Shortness of breath: Secondary | ICD-10-CM

## 2020-03-15 DIAGNOSIS — R0789 Other chest pain: Secondary | ICD-10-CM | POA: Diagnosis not present

## 2020-03-15 DIAGNOSIS — U099 Post covid-19 condition, unspecified: Secondary | ICD-10-CM

## 2020-03-15 MED ORDER — METOPROLOL TARTRATE 100 MG PO TABS: 100.0000 mg | ORAL_TABLET | Freq: Once | ORAL | 0 refills | Status: DC

## 2020-03-15 NOTE — Telephone Encounter (Signed)
She's aware to take as needed and tolerated. She mentioned she's tried taking this before but had side effects so she will try 1/2 tablet.

## 2020-03-15 NOTE — Telephone Encounter (Signed)
OK but start with 1/2 tablet 4 times daily as needed and go up if tolerated and needed.

## 2020-03-15 NOTE — Progress Notes (Signed)
Cardiology Office Note:    Date:  03/15/2020   ID:  Yolanda Carroll, DOB 31-Jan-1956, MRN [id number]  PCP:  Jonathon Jordan, MD  [facility] Cardiologist:  No primary care provider on file.  [facility] Electrophysiologist:  None   Referring MD: Jonathon Jordan, MD     History of Present Illness:    Yolanda Carroll is a 64 y.o. female here for the evaluation of shortness of breath, difficulty breathing at the request of Dr. Stephanie Acre.  She had Covid in August 2021, Regeneron infusion administered.  Felt better.  Ended up getting prednisone injection in her back for chronic pain issues and then felt her Covid type symptoms return.  She has had longstanding gastric issues as well but these have been intensified post COVID.  Has anxiety such as waking up in the morning, very nauseous.  Chest discomfort.  Takes Xanax before bedtime and now has to take a Xanax when waking up in the morning.  Usually she did not need to do this during the day.  She is being evaluated very thoroughly by GI.  Has an endoscopy upcoming.  Her brother had some similar types issues with chest discomfort but then was diagnosed with esophageal spasm.  Non-smoker No early family history of CAD  Hemoglobin 12.4 creatinine 0.79 potassium 3.6 TSH 2.53 LDL 118 from outside labs.  Past Medical History:  Diagnosis Date   Allergy    Arthritis    Cervical dysplasia    Endometriosis    Fatigue    GERD (gastroesophageal reflux disease)    IBS (irritable bowel syndrome)    Migraine    Osteoporosis 01/2019   T score -3.1   Plantar fasciitis    PONV (postoperative nausea and vomiting)    also difficult to wake up   Recurrent vaginitis    Reflux    Scoliosis    Wears glasses     Past Surgical History:  Procedure Laterality Date   ANTERIOR CERVICAL DECOMP/DISCECTOMY FUSION  04/17/2011   Procedure: ANTERIOR CERVICAL DECOMPRESSION/DISCECTOMY FUSION 2 LEVELS;  Surgeon: Hosie Spangle;   Location: [place] [facility];  Service: Neurosurgery;  Laterality: N/A;  Cervical five-six, six-seven anterior cervical decompression with fusion,  plating,  and bonegraft    APPENDECTOMY  1978   BREAST BIOPSY  07/13/2011   Procedure: BREAST BIOPSY WITH NEEDLE LOCALIZATION;  Surgeon: Rolm Bookbinder, MD;  Location: [place];  Service: General;  Laterality: Right;  Right breast wire localization biopsy   BREAST EXCISIONAL BIOPSY Right 2013   BREAST SURGERY     Breast Bx-Benign   CYSTOSCOPY     GYNECOLOGIC CRYOSURGERY     HERNIA REPAIR  08/02/1995   RIH   LAPAROSCOPIC ENDOMETRIOSIS FULGURATION  1997   PELVIC LAPAROSCOPY     ROTATOR CUFF REPAIR     right 2002 left 2000    Current Medications: Current Meds  Medication Sig   AIMOVIG 140 MG/ML SOAJ INJECT 140 MG INTO SKIN EVERY 30 DAYS   ALPRAZolam (XANAX) 0.25 MG tablet TAKE ONE TABLET BY MOUTH TWICE A DAY   Ascorbic Acid (VITAMIN C PO) Take 1 tablet by mouth daily. 524m capsule   Bacillus Coagulans-Inulin (PROBIOTIC) 1-250 BILLION-MG CAPS    baclofen (LIORESAL) 10 MG tablet Take 10 mg by mouth as needed for muscle spasms.    Calcium Carbonate-Vit D-Min (CALCIUM 1200 PO) Take by mouth daily.   Cholecalciferol (VITAMIN D PO) Take 800 Units by mouth daily.    conjugated  estrogens (PREMARIN) vaginal cream Place 1 Applicatorful vaginally 2 (two) times a week.   denosumab (PROLIA) 60 MG/ML SOSY injection Inject 60 mg into the skin every 6 (six) months.   DEPAKOTE ER 250 MG 24 hr tablet Take 1 tablet (250 mg total) by mouth daily.   gabapentin (NEURONTIN) 100 MG capsule Take 200 mg by mouth daily.   L-LYSINE PO Take 1 tablet by mouth daily.    Lactobacillus (ACIDOPHILUS) 100 MG CAPS Take 100 mg by mouth daily.   lidocaine (LIDODERM) 5 % Place 1 patch onto the skin as needed. Back pain   magnesium 30 MG tablet Take 30 mg by mouth every other day.    meclizine (ANTIVERT) 25 MG tablet Take 25 mg by mouth 3  (three) times daily as needed for dizziness.   Multiple Vitamin (MULTIVITAMIN) tablet Take 1 tablet by mouth daily.   nefazodone (SERZONE) 150 MG tablet Take 1 tablet (150 mg total) by mouth at bedtime.   ondansetron (ZOFRAN) 4 MG tablet Take 4 mg by mouth every 8 (eight) hours as needed.   pantoprazole (PROTONIX) 40 MG tablet Take 40 mg by mouth 2 (two) times daily.   Rimegepant Sulfate (NURTEC) 75 MG TBDP Take 75 mg by mouth daily as needed. For migraines. Take as close to onset of migraine as possible. One daily maximum.   Turmeric (QC TUMERIC COMPLEX PO) Take 117 mg by mouth daily.   valACYclovir (VALTREX) 500 MG tablet Take 500 mg by mouth 2 (two) times daily as needed (oral herpes).    vitamin E 180 MG (400 UNITS) capsule Take 400 Units by mouth daily.   vitamin E 400 UNIT capsule Take 200 Units by mouth daily.      Allergies:   Penicillins, Atarax [hydroxyzine], Celexa [citalopram hydrobromide], Erythromycin, Flagyl [metronidazole], Nortriptyline, Prilosec [omeprazole], Requip [ropinirole], Tramadol, Darvocet [propoxyphene n-acetaminophen], and Percocet [oxycodone-acetaminophen]   Social History   Socioeconomic History   Marital status: Married    Spouse name: Fritz Pickerel   Number of children: 0   Years of education: 16   Highest education level: Not on file  Occupational History    Comment: [employer]  Tobacco Use   Smoking status: Former Smoker    Quit date: 05/17/1983    Years since quitting: 36.8   Smokeless tobacco: Never Used  Vaping Use   Vaping Use: Never used  Substance and Sexual Activity   Alcohol use: Yes    Alcohol/week: 1.0 - 2.0 standard drink    Types: 1 - 2 Standard drinks or equivalent per week    Comment: socially, occasional   Drug use: No   Sexual activity: Yes    Birth control/protection: Post-menopausal    Comment: intercourse age 71 , sexual partners more than 5  Other Topics Concern   Not on file  Social History  Narrative   Pt is married, no children.  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    Caffeine- very little.   Right handed   Lives at home with her husband   Social Determinants of Health   Financial Resource Strain:    Difficulty of Paying Living Expenses: Not on file  Food Insecurity:    Worried About Charity fundraiser in the Last Year: Not on file   [employer] of Food in the Last Year: Not on file  Transportation Needs:    Lack of Transportation (Medical): Not on file   Lack of Transportation (Non-Medical): Not on file  Physical Activity:    Days of Exercise per Week: Not on file   Minutes of Exercise per Session: Not on file  Stress:    Feeling of Stress : Not on file  Social Connections:    Frequency of Communication with Friends and Family: Not on file   Frequency of Social Gatherings with Friends and Family: Not on file   Attends Religious Services: Not on file   Active Member of Clubs or Organizations: Not on file   Attends Archivist Meetings: Not on file   Marital Status: Not on file     Family History: The patient's family history includes Bipolar disorder in her mother; Breast cancer in her maternal aunt; Breast cancer (age of onset: 61) in her cousin; Cancer in her father, maternal aunt, and paternal aunt; Diabetes in her maternal aunt; Other in her brother and mother.  ROS:   Please see the history of present illness.    Positive for anxieties chest pain shortness of breath GI disturbance all other systems reviewed and are negative.  EKGs/Labs/Other Studies Reviewed:    The following studies were reviewed today:  Office notes personally reviewed from Dr. Stephanie Acre  Test x-ray personally reviewed from 12/21/2019 showed possible left infiltrate developing at the left lung base.  EKG:  EKG is  ordered today.  The ekg ordered today demonstrates sinus rhythm 66 with nonspecific ST-T wave changes  Recent Labs: 12/21/2019: ALT  24; Hemoglobin 12.5; Platelets 160 12/22/2019: BUN <5; Creatinine, Ser 0.76; Potassium 3.8; Sodium 136  Recent Lipid Panel No results found for: CHOL, TRIG, HDL, CHOLHDL, VLDL, LDLCALC, LDLDIRECT    Physical Exam:    VS:  BP 118/70    Pulse 66    Ht _0  (1.727 m)    Wt 120 lb 12.8 oz (54.8 kg)    SpO2 99%    BMI 18.37 kg/m     Wt Readings from Last 3 Encounters:  03/15/20 120 lb 12.8 oz (54.8 kg)  02/23/20 122 lb (55.3 kg)  12/21/19 130 lb (59 kg)     GEN: Thin in no acute distress HEENT: Normal NECK: No JVD; No carotid bruits LYMPHATICS: No lymphadenopathy CARDIAC: RRR, no murmurs, rubs, gallops RESPIRATORY:  Clear to auscultation without rales, wheezing or rhonchi  ABDOMEN: Soft, non-tender, non-distended MUSCULOSKELETAL:  No edema; No deformity  SKIN: Warm and dry NEUROLOGIC:  Alert and oriented x 3 PSYCHIATRIC:  Normal affect   ASSESSMENT:    1. Shortness of breath   2. Chest discomfort   3. Post-COVID chronic dyspnea   4. Precordial pain   5. Pre-procedure lab exam    PLAN:    In order of problems listed above:  Shortness of breath with activity/chest tightness Post COVID -We will check an echocardiogram to ensure proper structure and function of her heart. -We will check a coronary CT scan with possible FFR analysis to ensure that she does not have any flow-limiting coronary artery disease that could be contributing to her symptoms.  EKG shows nonspecific ST-T wave changes. -Hopefully if normal, this will provide reassurance for her to continue her road to feeling better. -Currently taking Xanax for her anxieties.    Medication Adjustments/Labs and Tests Ordered: Current medicines are reviewed at length with the patient today.  Concerns regarding medicines are outlined above.  Orders Placed This Encounter  Procedures   CT CORONARY MORPH W/CTA COR W/SCORE W/CA W/CM &/OR WO/CM   CT CORONARY FRACTIONAL FLOW RESERVE DATA PREP  CT CORONARY FRACTIONAL FLOW  RESERVE FLUID ANALYSIS   Basic metabolic panel   ECHOCARDIOGRAM COMPLETE   Meds ordered this encounter  Medications   metoprolol tartrate (LOPRESSOR) 100 MG tablet    Sig: Take 1 tablet (100 mg total) by mouth once for 1 dose. Take 1 tablet 2 hours before your CT scan    Dispense:  1 tablet    Refill:  0    Patient Instructions  Medication Instructions:  The current medical regimen is effective;  continue present plan and medications.  *If you need a refill on your cardiac medications before your next appointment, please call your pharmacy*   Testing/Procedures: Your physician has requested that you have an echocardiogram. Echocardiography is a painless test that uses sound waves to create images of your heart. It provides your doctor with information about the size and shape of your heart and how well your hearts chambers and valves are working. This procedure takes approximately one hour. There are no restrictions for this procedure.  Your cardiac CT will be scheduled at:   [facility] [address] [place], [place] [address] [phone]  Please arrive at the [facility] main entrance of [facility] 30 minutes prior to test start time. Proceed to the [facility] Radiology Department (first floor) to check-in and test prep.  Please follow these instructions carefully (unless otherwise directed):  On the Night Before the Test:  Be sure to Drink plenty of water.  Do not consume any caffeinated/decaffeinated beverages or chocolate 12 hours prior to your test.  Do not take any antihistamines 12 hours prior to your test.  On the Day of the Test:  Drink plenty of water. Do not drink any water within one hour of the test.  Do not eat any food 4 hours prior to the test.  You may take your regular medications prior to the test.   Take metoprolol (Lopressor) two hours prior to test.  HOLD Furosemide/Hydrochlorothiazide morning of the  test.  FEMALES- please wear underwire-free bra if available      After the Test:  Drink plenty of water.  After receiving IV contrast, you may experience a mild flushed feeling. This is normal.  On occasion, you may experience a mild rash up to 24 hours after the test. This is not dangerous. If this occurs, you can take Benadryl 25 mg and increase your fluid intake.  If you experience trouble breathing, this can be serious. If it is severe call 911 IMMEDIATELY. If it is mild, please call our office.  If you take any of these medications: Glipizide/Metformin, Avandament, Glucavance, please do not take 48 hours after completing test unless otherwise instructed.   Once we have confirmed authorization from your insurance company, we will call you to set up a date and time for your test. Based on how quickly your insurance processes prior authorizations requests, please allow up to 4 weeks to be contacted for scheduling your Cardiac CT appointment. Be advised that routine Cardiac CT appointments could be scheduled as many as 8 weeks after your provider has ordered it.  For non-scheduling related questions, please contact the cardiac imaging nurse navigator should you have any questions/concerns: Marchia Bond, Cardiac Imaging Nurse Navigator Burley Saver, Interim Cardiac Imaging Nurse [place] [facility] Direct Office Dial: [phone]   For scheduling needs, including cancellations and rescheduling, please call Vivien Rota at [phone], option 3.   Follow-Up: At [facility], you and your health  needs are our priority.  As part of our continuing mission to provide you with exceptional heart care, we have created designated Provider Care Teams.  These Care Teams include your primary Cardiologist (physician) and Advanced Practice Providers (APPs -  Physician Assistants and Nurse Practitioners) who all work together to provide you with the care you need, when you need  it.  We recommend signing up for the patient portal called "MyChart".  Sign up information is provided on this After Visit Summary.  MyChart is used to connect with patients for Virtual Visits (Telemedicine).  Patients are able to view lab/test results, encounter notes, upcoming appointments, etc.  Non-urgent messages can be sent to your provider as well.   To learn more about what you can do with MyChart, go to [email or web address].    Your next appointment:   Further follow up will be based on the results of the above testing.  Thank you for choosing [facility]!!        Signed, Candee Furbish, MD  03/15/2020 5:02 PM    [facility]

## 2020-03-15 NOTE — Patient Instructions (Addendum)
Medication Instructions:  The current medical regimen is effective;  continue present plan and medications.  *If you need a refill on your cardiac medications before your next appointment, please call your pharmacy*   Testing/Procedures: Your physician has requested that you have an echocardiogram. Echocardiography is a painless test that uses sound waves to create images of your heart. It provides your doctor with information about the size and shape of your heart and how well your heart's chambers and valves are working. This procedure takes approximately one hour. There are no restrictions for this procedure.  Your cardiac CT will be scheduled at:   [facility] [address] [place], [place] [address] [phone]  Please arrive at the [facility] main entrance of [facility] 30 minutes prior to test start time. Proceed to the [facility] Radiology Department (first floor) to check-in and test prep.  Please follow these instructions carefully (unless otherwise directed):  On the Night Before the Test: . Be sure to Drink plenty of water. . Do not consume any caffeinated/decaffeinated beverages or chocolate 12 hours prior to your test. . Do not take any antihistamines 12 hours prior to your test.  On the Day of the Test: . Drink plenty of water. Do not drink any water within one hour of the test. . Do not eat any food 4 hours prior to the test. . You may take your regular medications prior to the test.  . Take metoprolol (Lopressor) two hours prior to test. . HOLD Furosemide/Hydrochlorothiazide morning of the test. . FEMALES- please wear underwire-free bra if available      After the Test: . Drink plenty of water. . After receiving IV contrast, you may experience a mild flushed feeling. This is normal. . On occasion, you may experience a mild rash up to 24 hours after the test. This is not dangerous. If this occurs, you can take Benadryl 25 mg and increase  your fluid intake. . If you experience trouble breathing, this can be serious. If it is severe call 911 IMMEDIATELY. If it is mild, please call our office. . If you take any of these medications: Glipizide/Metformin, Avandament, Glucavance, please do not take 48 hours after completing test unless otherwise instructed.   Once we have confirmed authorization from your insurance company, we will call you to set up a date and time for your test. Based on how quickly your insurance processes prior authorizations requests, please allow up to 4 weeks to be contacted for scheduling your Cardiac CT appointment. Be advised that routine Cardiac CT appointments could be scheduled as many as 8 weeks after your provider has ordered it.  For non-scheduling related questions, please contact the cardiac imaging nurse navigator should you have any questions/concerns: Marchia Bond, Cardiac Imaging Nurse Navigator Burley Saver, Interim Cardiac Imaging Nurse [place] and V[facility] Services Direct Office Dial: [phone]   For scheduling needs, including cancellations and rescheduling, please call Vivien Rota at [phone], option 3.   Follow-Up: At [facility], you and your health needs are our priority.  As part of our continuing mission to provide you with exceptional heart care, we have created designated Provider Care Teams.  These Care Teams include your primary Cardiologist (physician) and Advanced Practice Providers (APPs -  Physician Assistants and Nurse Practitioners) who all work together to provide you with the care you need, when you need it.  We recommend signing up for the patient portal called "MyChart".  Sign up information  is provided on this After Visit Summary.  MyChart is used to connect with patients for Virtual Visits (Telemedicine).  Patients are able to view lab/test results, encounter notes, upcoming appointments, etc.  Non-urgent messages can be sent to your provider as well.     To learn more about what you can do with MyChart, go to [email or web address].    Your next appointment:   Further follow up will be based on the results of the above testing.  Thank you for choosing [facility]!!

## 2020-03-18 NOTE — Addendum Note (Signed)
Addended by: Lanna Poche R on: 03/18/2020 04:46 PM   Modules accepted: Orders

## 2020-03-23 ENCOUNTER — Telehealth: Payer: Self-pay | Admitting: *Deleted

## 2020-03-23 NOTE — Telephone Encounter (Signed)
Left message on VM to call back to schedule blood work (BMP) prior to the Coronary CT scan scheduled 11/15.

## 2020-03-23 NOTE — Telephone Encounter (Signed)
-----   Message from Roosvelt Maser sent at 03/19/2020  8:42 AM EDT ----- Regarding: ct heart   Scheduled 1/15 @ 11:30am.  She will need labs  Vivien Rota

## 2020-03-24 NOTE — Telephone Encounter (Signed)
BMP has been scheduled for 03/26/2020.

## 2020-03-26 ENCOUNTER — Other Ambulatory Visit: Payer: Self-pay

## 2020-03-26 ENCOUNTER — Other Ambulatory Visit: Payer: PRIVATE HEALTH INSURANCE | Admitting: *Deleted

## 2020-03-26 ENCOUNTER — Telehealth (HOSPITAL_COMMUNITY): Payer: Self-pay | Admitting: *Deleted

## 2020-03-26 DIAGNOSIS — R0602 Shortness of breath: Secondary | ICD-10-CM

## 2020-03-26 DIAGNOSIS — R0789 Other chest pain: Secondary | ICD-10-CM

## 2020-03-26 DIAGNOSIS — Z01812 Encounter for preprocedural laboratory examination: Secondary | ICD-10-CM

## 2020-03-26 LAB — BASIC METABOLIC PANEL
BUN/Creatinine Ratio: 10 — ABNORMAL LOW (ref 12–28)
BUN: 8 mg/dL (ref 8–27)
CO2: 25 mmol/L (ref 20–29)
Calcium: 9.4 mg/dL (ref 8.7–10.3)
Chloride: 98 mmol/L (ref 96–106)
Creatinine, Ser: 0.79 mg/dL (ref 0.57–1.00)
GFR calc Af Amer: 91 mL/min/{1.73_m2} (ref >59)
GFR calc non Af Amer: 79 mL/min/{1.73_m2} (ref >59)
Glucose: 83 mg/dL (ref 65–99)
Potassium: 3.9 mmol/L (ref 3.5–5.2)
Sodium: 134 mmol/L (ref 134–144)

## 2020-03-26 NOTE — Telephone Encounter (Signed)
Attempted to call patient regarding upcoming cardiac CT appointment. °Left message on voicemail with name and callback number ° ° Tai RN Navigator Cardiac Imaging °[facility] °[phone] Office °[phone] Cell ° °

## 2020-03-29 ENCOUNTER — Encounter (HOSPITAL_COMMUNITY): Payer: Self-pay

## 2020-03-29 ENCOUNTER — Other Ambulatory Visit: Payer: Self-pay

## 2020-03-29 ENCOUNTER — Ambulatory Visit (HOSPITAL_COMMUNITY)
Admission: RE | Admit: 2020-03-29 | Discharge: 2020-03-29 | Disposition: A | Discharge status: Home / Self Care | Payer: Managed Care, Other (non HMO) | Source: Ambulatory Visit | Attending: Cardiology | Admitting: Cardiology

## 2020-03-29 ENCOUNTER — Telehealth: Payer: Self-pay | Admitting: *Deleted

## 2020-03-29 DIAGNOSIS — R0789 Other chest pain: Secondary | ICD-10-CM | POA: Insufficient documentation

## 2020-03-29 DIAGNOSIS — R0602 Shortness of breath: Secondary | ICD-10-CM | POA: Diagnosis present

## 2020-03-29 MED ORDER — NITROGLYCERIN 0.4 MG SL SUBL
0.8000 mg | SUBLINGUAL_TABLET | Freq: Once | SUBLINGUAL | Status: AC
  Administered 2020-03-29: 0.8 mg via SUBLINGUAL

## 2020-03-29 MED ORDER — IOHEXOL 350 MG/ML SOLN
80.0000 mL | Freq: Once | INTRAVENOUS | Status: AC | PRN
  Administered 2020-03-29: 80 mL via INTRAVENOUS

## 2020-03-29 MED ORDER — NITROGLYCERIN 0.4 MG SL SUBL
SUBLINGUAL_TABLET | SUBLINGUAL | Status: AC
  Filled 2020-03-29: qty 2

## 2020-03-29 NOTE — Telephone Encounter (Signed)
Spoke with patient and reviewed result of coronary CT and Dr Marlou Porch' recommendations.  At this time she does not want to start anything new due to ongoing stomach issues she has had since having Covid in August.  She is scheduled to f/u with Dr Marlou Porch 12/15 and will discuss this with him during that visit.

## 2020-03-29 NOTE — Telephone Encounter (Signed)
Reassuring CT scan with no coronary artery disease.   There is mild aortic atherosclerosis.  Would recommend Crestor 10mg  once a day.  Check lipid panel and ALT in 3 months  Candee Furbish, MD

## 2020-04-01 ENCOUNTER — Other Ambulatory Visit: Payer: Self-pay

## 2020-04-01 ENCOUNTER — Encounter: Payer: Self-pay | Admitting: Psychiatry

## 2020-04-01 ENCOUNTER — Ambulatory Visit: Payer: PRIVATE HEALTH INSURANCE | Admitting: Interventional Cardiology

## 2020-04-01 ENCOUNTER — Ambulatory Visit (INDEPENDENT_AMBULATORY_CARE_PROVIDER_SITE_OTHER): Payer: 59 | Admitting: Psychiatry

## 2020-04-01 DIAGNOSIS — F411 Generalized anxiety disorder: Secondary | ICD-10-CM | POA: Diagnosis not present

## 2020-04-01 DIAGNOSIS — F320 Major depressive disorder, single episode, mild: Secondary | ICD-10-CM

## 2020-04-01 DIAGNOSIS — F32A Depression, unspecified: Secondary | ICD-10-CM

## 2020-04-01 DIAGNOSIS — K219 Gastro-esophageal reflux disease without esophagitis: Secondary | ICD-10-CM

## 2020-04-01 DIAGNOSIS — F410 Panic disorder [episodic paroxysmal anxiety] without agoraphobia: Secondary | ICD-10-CM | POA: Diagnosis not present

## 2020-04-01 DIAGNOSIS — F5105 Insomnia due to other mental disorder: Secondary | ICD-10-CM | POA: Diagnosis not present

## 2020-04-01 DIAGNOSIS — G43009 Migraine without aura, not intractable, without status migrainosus: Secondary | ICD-10-CM | POA: Diagnosis not present

## 2020-04-01 MED ORDER — MIRTAZAPINE 7.5 MG PO TABS: ORAL_TABLET | ORAL | 1 refills | Status: DC

## 2020-04-01 NOTE — Progress Notes (Signed)
KELSEY DURFLINGER [id number] 1955-06-05 64 y.o.    Subjective:   Patient ID:  Yolanda Carroll is a 64 y.o. (DOB 06/22/1955) female.  Chief Complaint:  Chief Complaint  Patient presents with  . Follow-up  . Medication Problem  . Stress    health problems  . Anxiety    Anxiety Symptoms include nausea. Patient reports no confusion, decreased concentration, nervous/anxious behavior or suicidal ideas.     PERMELIA BAMBA presents to the office today for follow-up of anxiety and migraine.  seen in July 2020.  No meds were changed.  She requires name brand Depakote because the generic caused worsening headaches.  Retired November 14, 2018.  Was so burned out at work.  Helping with church projects and helping friends.  Focusing on her health and doing PT for back and hip.  No kids or gkids.  Helps care for 67 yo M-in-law.  H is also busy which helps.  Given a surprise retirement party.     Last seen January 2021.  Nefazodone is no longer manufactured and she had to wean off.  We also discussed the potential of weaning off Depakote because Aimovig had been very effective at managing her migraine headache.  She called August 11, 2019 stating that she did taper off of Depakote because she did not feel like she needed it any more.  Her last dose was July 23, 2019 but afterwards noticed heartburn and swallowing issues.  Her GI doctor added Pepcid 40 mg daily.  Her neurologist had indicated that Depakote can sometimes help with esophageal issues and she wondered if that was connected.  She was given the option to restart a lower dose and perhaps taper more slowly.  Off Depakote for a month.  She elected not to restart it.  08/19/2019 appointment the following is noted: Tinnitus and mild anxiety worse off the Depakote and with some mind racing esp in the AM.   Most is better. Reflux is worse off Depakote ER 250 and wondering if she should restart it.   Disc article on Depakote helping GERD by  increasing lower esophageal sphincter tone. Has travelled some with family events.  M-in-law 37 soon.   HA good with Aimovig. Don't do season changes well and more HA in Spring often. Sleep not great lately with leg cramps.  Started more exercise.  Started snoring and H is a light sleeper.  Meds help. Plan no med changes.  10/03/2019 phone call with patient asking to restart residual nefazodone that she has on hand.  She felt that it helped back pain and she is experiencing more back pain recently.  She is aware that it is no longer manufactured to our knowledge and once she runs out it will not be available.  She indicated she had an off to take half of 150 mg nefazodone tablet for about 2 to 3 months and would like to do so.  11/05/2019 appointment with the following noted:  Appt moved earlier DT the following. Had stopped Depakote and nefazadone and reflux got worse.  Restarting Depakote didn't help much.  Restarted nefazodaone and reflux better right away.  May not need Depakote but doesn't want to change.  Can't exercise DT back pain since Jan ans worse since March.  Getting new doctor.  More depressed and anxious and sleep problems DT back pain.   Usually only 0.25 mg alprazolam at night. Plan: has to stop nefazodone when it runs out bc no longer manufactured.  Therefore  will try trazodone in it's place.  01/07/20 appt with the following noted: She and H both got Covid.  Was pretty sick for 10 days.  Lost taste.  Had little resp stuff but severe diarrhea and nausea and couldn't keep fluids down.  No residual sx. H had resp sx with pneumonia and had to get O2.  Hosp for 3 days. Still have some nefazodone and nursing it along. Tried trazodone 50 HS for 2 nights and didn't sleep well and had a HA  Nefazodone and Depakote both helped the GERD.  She has enough nefazadone for about 6 weeks. Plan: Nefazodone helped the reflux and she wants to take it as long as possible.  She didn't have withdrawal  off nefazodone. When she runs out then start trazodone.  If that fails then use Viibryd.  Disc SE and alternatives.   Rec try trazodone again but use 100 mg and see if sleeping better prevents the AM HA.  If HA recurs then viibryd. Starter kit.  Depakote didn't help as much as expected and she might stop it.   02/18/2020 phone call: Naje called to report that she is experiencing high anxiety.  It is causing chest discomfort and breathing problems. Has appt 11/18 and is on the wait list but needs to discuss how to relief this anxiety.  Please call. Response: Note Rtc to patient, she does have the Viibryd and does remember that discussion. She has not been able to start that because she received a steroid injection a couple weeks ago and caused her stomach to be upset. She didn't want to start it knowing that's a side effect. She feels like the steroid injection may have worsened all her symptoms. She did try the trazodone but it was too much for her, even taking a 1/2 tablet. She does have some serzone left and has been taking that until this improves. Also taking Xanax as needed during day if needed. Hoping all of this will improve soon. Informed her I would update Dr. Clovis Pu with her information.       04/01/2020 appointment with the following noted: Out of nefazodone a few weeks ago.  More reflux off it.  Tried trazodone a couple of times.  Tried 100 mg trazodone and heart skipped.  50 mg awoke with chest tightness.  Long haul Covid with GI sx.  Nausea and anxiety problems daily since mid September.  Covid early August and got monoclonal infusion which helped.  Headed to long haul Covid clinic in [place] Monday.   Needed to increase alprazolam to BID.  And 80% better with that.  Panic some out of bed.  Sx started after steroid shot for her back 9/21.   Hasn't tried Viibryd DT fear of GI worsening.  Poor appetite and lost 15#. Not taken tramadol yet. Migraine under control.  Patient denies difficulty  with sleep initiation with Xanax.. Denies appetite disturbance.  Patient reports that energy and motivation have been good.  Patient denies any difficulty with concentration.  Patient denies any suicidal ideation. More depressed and tearful with pain but varies with pain levels.  Good and bad days.  Not too depressed with anxiety worse.  Insomnia managed with  With alprazolam.    Saw neurologist for Migraine and had problems with the meds RX.  Aimovig has helped..    Past Psychiatric Medication Trials: Failed multiple other antidepressants, nefazodone & Depakote for migraine,  citalopram palpitations trazodone  Review of Systems:  Review of Systems  Gastrointestinal:  Positive for abdominal pain and nausea.  Musculoskeletal: Positive for arthralgias and back pain.  Neurological: Negative for tremors, weakness and headaches.  Psychiatric/Behavioral: Negative for agitation, behavioral problems, confusion, decreased concentration, dysphoric mood, hallucinations, self-injury, sleep disturbance and suicidal ideas. The patient is not nervous/anxious and is not hyperactive.     Medications: I have reviewed the patient's current medications.  Current Outpatient Medications  Medication Sig Dispense Refill  . AIMOVIG 140 MG/ML SOAJ INJECT 140 MG INTO SKIN EVERY 30 DAYS 3 pen 3  . ALPRAZolam (XANAX) 0.25 MG tablet TAKE ONE TABLET BY MOUTH TWICE A DAY 60 tablet 1  . Ascorbic Acid (VITAMIN C PO) Take 1 tablet by mouth daily. 599m capsule    . Bacillus Coagulans-Inulin (PROBIOTIC) 1-250 BILLION-MG CAPS     . baclofen (LIORESAL) 10 MG tablet Take 10 mg by mouth as needed for muscle spasms.     . Calcium Carbonate-Vit D-Min (CALCIUM 1200 PO) Take by mouth daily.    . Cholecalciferol (VITAMIN D PO) Take 800 Units by mouth daily.     .Marland Kitchenconjugated estrogens (PREMARIN) vaginal cream Place 1 Applicatorful vaginally 2 (two) times a week. 42.5 g 5  . denosumab (PROLIA) 60 MG/ML SOSY injection Inject 60 mg  into the skin every 6 (six) months.    . DEPAKOTE ER 250 MG 24 hr tablet Take 1 tablet (250 mg total) by mouth daily. 90 tablet 1  . gabapentin (NEURONTIN) 100 MG capsule Take 200 mg by mouth daily.    .Marland KitchenL-LYSINE PO Take 1 tablet by mouth daily.     . Lactobacillus (ACIDOPHILUS) 100 MG CAPS Take 100 mg by mouth daily.    .Marland Kitchenlidocaine (LIDODERM) 5 % Place 1 patch onto the skin as needed. Back pain    . magnesium 30 MG tablet Take 30 mg by mouth every other day.     . meclizine (ANTIVERT) 25 MG tablet Take 25 mg by mouth 3 (three) times daily as needed for dizziness.    . Multiple Vitamin (MULTIVITAMIN) tablet Take 1 tablet by mouth daily.    . ondansetron (ZOFRAN) 4 MG tablet Take 4 mg by mouth every 8 (eight) hours as needed.    . pantoprazole (PROTONIX) 40 MG tablet Take 40 mg by mouth 2 (two) times daily.    . Rimegepant Sulfate (NURTEC) 75 MG TBDP Take 75 mg by mouth daily as needed. For migraines. Take as close to onset of migraine as possible. One daily maximum. 4 tablet 0  . Turmeric (QC TUMERIC COMPLEX PO) Take 117 mg by mouth daily.    . valACYclovir (VALTREX) 500 MG tablet Take 500 mg by mouth 2 (two) times daily as needed (oral herpes).     . vitamin E 180 MG (400 UNITS) capsule Take 400 Units by mouth daily.    . vitamin E 400 UNIT capsule Take 200 Units by mouth daily.     .Marland KitchenFAMOTIDINE PO Take 20 mg by mouth daily.     . metoprolol tartrate (LOPRESSOR) 100 MG tablet Take 1 tablet (100 mg total) by mouth once for 1 dose. Take 1 tablet 2 hours before your CT scan 1 tablet 0  . nefazodone (SERZONE) 150 MG tablet Take 1 tablet (150 mg total) by mouth at bedtime. (Patient not taking: Reported on 04/01/2020) 90 tablet 0   No current facility-administered medications for this visit.    Medication Side Effects: None  Allergies:  Allergies  Allergen Reactions  . Penicillins Other (See Comments)  Seizure as a child  . Atarax [Hydroxyzine] Other (See Comments)    Headache, depression   . Celexa [Citalopram Hydrobromide] Other (See Comments)    Chest pain  . Erythromycin      Upset stomache  . Flagyl [Metronidazole]     Dizzy and increased heart rate  . Nortriptyline Itching  . Prilosec [Omeprazole]     Abdominal pain  . Requip [Ropinirole]     Made sx worse  . Tramadol Itching  . Darvocet [Propoxyphene N-Acetaminophen] Itching  . Percocet [Oxycodone-Acetaminophen] Itching    Past Medical History:  Diagnosis Date  . Allergy   . Arthritis   . Cervical dysplasia   . Endometriosis   . Fatigue   . GERD (gastroesophageal reflux disease)   . IBS (irritable bowel syndrome)   . Migraine   . Osteoporosis 01/2019   T score -3.1  . Plantar fasciitis   . PONV (postoperative nausea and vomiting)    also difficult to wake up  . Recurrent vaginitis   . Reflux   . Scoliosis   . Wears glasses     Family History  Problem Relation Age of Onset  . Cancer Father        lymphoma  . Other Mother        bipolar,reflux  . Bipolar disorder Mother   . Other Brother        sinus problems  . Cancer Maternal Aunt        uterine cancer  . Breast cancer Maternal Aunt        40's  . Diabetes Maternal Aunt   . Cancer Paternal Aunt        Colon cancer  . Breast cancer Cousin 82       Mat. 1st cousin    Social History   Socioeconomic History  . Marital status: Married    Spouse name: Fritz Pickerel  . Number of children: 0  . Years of education: 79  . Highest education level: Not on file  Occupational History    Comment: [employer]  Tobacco Use  . Smoking status: Former Smoker    Quit date: 05/17/1983    Years since quitting: 36.9  . Smokeless tobacco: Never Used  Vaping Use  . Vaping Use: Never used  Substance and Sexual Activity  . Alcohol use: Yes    Alcohol/week: 1.0 - 2.0 standard drink    Types: 1 - 2 Standard drinks or equivalent per week    Comment: socially, occasional  . Drug use: No  . Sexual activity: Yes    Birth  control/protection: Post-menopausal    Comment: intercourse age 62 , sexual partners more than 5  Other Topics Concern  . Not on file  Social History Narrative   Pt is married, no children.  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    Caffeine- very little.   Right handed   Lives at home with her husband   Social Determinants of Health   Financial Resource Strain:   . Difficulty of Paying Living Expenses: Not on file  Food Insecurity:   . Worried About Charity fundraiser in the Last Year: Not on file  . Ran Out of Food in the Last Year: Not on file  Transportation Needs:   . Lack of Transportation (Medical): Not on file  . Lack of Transportation (Non-Medical): Not on file  Physical Activity:   . Days of Exercise per Week: Not on file  . Minutes  of Exercise per Session: Not on file  Stress:   . Feeling of Stress : Not on file  Social Connections:   . Frequency of Communication with Friends and Family: Not on file  . Frequency of Social Gatherings with Friends and Family: Not on file  . Attends Religious Services: Not on file  . Active Member of Clubs or Organizations: Not on file  . Attends Archivist Meetings: Not on file  . Marital Status: Not on file  Intimate Partner Violence:   . Fear of Current or Ex-Partner: Not on file  . Emotionally Abused: Not on file  . Physically Abused: Not on file  . Sexually Abused: Not on file    Past Medical History, Surgical history, Social history, and Family history were reviewed and updated as appropriate.   Please see review of systems for further details on the patient's review from today.   Objective:   Physical Exam:  There were no vitals taken for this visit.  Physical Exam Neurological:     Mental Status: She is alert and oriented to person, place, and time.     Cranial Nerves: No dysarthria.  Psychiatric:        Attention and Perception: Attention and perception normal.        Mood and Affect:  Mood is anxious and depressed. Affect is not tearful.        Speech: Speech normal.        Behavior: Behavior is cooperative.        Thought Content: Thought content normal. Thought content is not paranoid or delusional. Thought content does not include homicidal or suicidal ideation. Thought content does not include homicidal or suicidal plan.        Cognition and Memory: Cognition and memory normal.        Judgment: Judgment normal.     Comments: Insight intact      Lab Review:     Component Value Date/Time   NA 134 03/26/2020 1056   K 3.9 03/26/2020 1056   CL 98 03/26/2020 1056   CO2 25 03/26/2020 1056   GLUCOSE 83 03/26/2020 1056   GLUCOSE 98 12/22/2019 1230   BUN 8 03/26/2020 1056   CREATININE 0.79 03/26/2020 1056   CALCIUM 9.4 03/26/2020 1056   PROT 7.6 12/21/2019 0949   ALBUMIN 4.3 12/21/2019 0949   AST 24 12/21/2019 0949   ALT 24 12/21/2019 0949   ALKPHOS 42 12/21/2019 0949   BILITOT 0.4 12/21/2019 0949   GFRNONAA 79 03/26/2020 1056   GFRAA 91 03/26/2020 1056       Component Value Date/Time   WBC 3.4 (L) 12/21/2019 0949   RBC 3.95 12/21/2019 0949   HGB 12.5 12/21/2019 0949   HCT 37.0 12/21/2019 0949   PLT 160 12/21/2019 0949   MCV 93.7 12/21/2019 0949   MCH 31.6 12/21/2019 0949   MCHC 33.8 12/21/2019 0949   RDW 12.6 12/21/2019 0949   LYMPHSABS 0.7 12/21/2019 0949   MONOABS 0.6 12/21/2019 0949   EOSABS 0.0 12/21/2019 0949   BASOSABS 0.0 12/21/2019 0949    Normal liver enzymes in September.  No results found for: POCLITH, LITHIUM   No results found for: PHENYTOIN, PHENOBARB, VALPROATE, CBMZ   .res Assessment: Plan:    Mela was seen today for follow-up, medication problem, stress and anxiety.  Diagnoses and all orders for this visit:  Generalized anxiety disorder  Insomnia due to mental condition  Migraine without aura and without status migrainosus, not intractable  Gastroesophageal reflux disease without esophagitis  Greater than 50% of 30  min non face to face time with patient was spent on counseling and coordination of care. We discussed the following.  We discussed the short-term risks associated with benzodiazepines including sedation and increased fall risk among others.  Discussed long-term side effect risk including dependence, potential withdrawal symptoms, and the potential eventual dose-related risk of dementia. Disc newer studies that cast in doubt the relationship with dementia. Continue Xanax 0.25 mg BID for panic and GAD   If tramadol doesn't help GI then Try mirtazapine with Xanax for GI problems including nausea and appetite  Nefazodone helped the reflux and she wants to take it as long as possible.  She didn't have withdrawal off nefazodone. When she runs out then start trazodone.  If that fails then use Viibryd.  Disc SE and alternatives.   Rec try trazodone again but use 100 mg and see if sleeping better prevents the AM HA.  If HA recurs then viibryd. Starter kit.  Depakote didn't help as much as expected and she might stop it.   Consider Genesight testing bc med sensitivity and failures.  Disc way to wean off nefazodone bc she may try this first.  This was a 30-minute appointment  FU  3 mos  Lynder Parents, MD, DFAPA  Please see After Visit Summary for patient specific instructions.  Future Appointments  Date Time Provider [place]  04/05/2020  2:00 PM [facility]-PROVIDER [facility] None  04/06/2020  2:05 PM [facility] ECHO 1 [id number]  04/28/2020 10:00 AM Jerline Pain, MD [facility]  05/13/2020 11:00 AM [facility] NURSE [facility] None  08/18/2020  4:00 PM Cottle, Billey Co., MD CP-CP None  12/02/2020  9:20 AM Philemon Kingdom, MD [facility] None  02/22/2021 11:00 AM Philemon Kingdom, MD [facility] None    No orders of the defined types were placed in this encounter.     -------------------------------

## 2020-04-05 ENCOUNTER — Ambulatory Visit (INDEPENDENT_AMBULATORY_CARE_PROVIDER_SITE_OTHER): Payer: PRIVATE HEALTH INSURANCE | Admitting: Nurse Practitioner

## 2020-04-05 VITALS — BP 110/70 | HR 75 | Temp 97.1°F | Ht 68.0 in | Wt 118.0 lb

## 2020-04-05 DIAGNOSIS — R002 Palpitations: Secondary | ICD-10-CM | POA: Diagnosis not present

## 2020-04-05 DIAGNOSIS — R110 Nausea: Secondary | ICD-10-CM | POA: Insufficient documentation

## 2020-04-05 DIAGNOSIS — R197 Diarrhea, unspecified: Secondary | ICD-10-CM | POA: Diagnosis not present

## 2020-04-05 DIAGNOSIS — Z8616 Personal history of COVID-19: Secondary | ICD-10-CM | POA: Insufficient documentation

## 2020-04-05 DIAGNOSIS — F419 Anxiety disorder, unspecified: Secondary | ICD-10-CM

## 2020-04-05 NOTE — Progress Notes (Signed)
@Patient  ID: Yolanda Carroll, female    DOB: 1955/08/26, 64 y.o.   MRN: [id number]  Chief Complaint  Patient presents with  . New Patient (Initial Visit)    COVID 12/2019 GI issues, nausea.     Referring provider: Jonathon Jordan, MD   64 year old female with history of migraine, GERD, IBS, cervical radiculopathy, osteoporosis, scoliosis, anxiety.  HPI Patient presents today for post COVID care clinic visit.  She was diagnosed with Covid in August 2021.  She did receive monoclonal antibody infusion.  Patient states that she has been having ongoing symptoms that have mainly been related to GI issues.  She complains of ongoing severe acid reflux, diarrhea, nausea.  She states that recently the diarrhea has subsided.  Patient has had poor appetite, weight loss.  She has been seen by [facility].  She did have sigmoidoscopy with biopsies.  She has not received her biopsy results yet.  Patient is currently on Protonix 40 mg twice daily and also takes Pepcid at times.  Patient does have Zofran and Phenergan to take as needed.  She also complains today of ongoing anxiety.  She states that at times she has panic attacks that include shortness of breath and heart palpitations.  Patient has been seen by psychiatry and cardiology.  Patient is currently on Xanax as needed.  Patient does have a history of anxiety issues and was seeing psychiatry before she was diagnosed with Covid.  Denies f/c/s, n/v/d, hemoptysis, PND, chest pain or edema.       Allergies  Allergen Reactions  . Penicillins Other (See Comments)    Seizure as a child  . Atarax [Hydroxyzine] Other (See Comments)    Headache, depression  . Celexa [Citalopram Hydrobromide] Other (See Comments)    Chest pain  . Erythromycin      Upset stomache  . Flagyl [Metronidazole]     Dizzy and increased heart rate  . Nortriptyline Itching  . Prilosec [Omeprazole]     Abdominal pain  . Requip [Ropinirole]     Made sx worse  . Tramadol  Itching  . Darvocet [Propoxyphene N-Acetaminophen] Itching  . Percocet [Oxycodone-Acetaminophen] Itching    Immunization History  Administered Date(s) Administered  . Influenza Split 03/03/2011    Past Medical History:  Diagnosis Date  . Allergy   . Arthritis   . Cervical dysplasia   . Endometriosis   . Fatigue   . GERD (gastroesophageal reflux disease)   . IBS (irritable bowel syndrome)   . Migraine   . Osteoporosis 01/2019   T score -3.1  . Plantar fasciitis   . PONV (postoperative nausea and vomiting)    also difficult to wake up  . Recurrent vaginitis   . Reflux   . Scoliosis   . Wears glasses     Tobacco History: Social History   Tobacco Use  Smoking Status Former Smoker  . Quit date: 05/17/1983  . Years since quitting: 36.9  Smokeless Tobacco Never Used   Counseling given: Yes   Outpatient Encounter Medications as of 04/05/2020  Medication Sig  . AIMOVIG 140 MG/ML SOAJ INJECT 140 MG INTO SKIN EVERY 30 DAYS  . ALPRAZolam (XANAX) 0.25 MG tablet TAKE ONE TABLET BY MOUTH TWICE A DAY  . Ascorbic Acid (VITAMIN C PO) Take 1 tablet by mouth daily. 500mg  capsule  . Bacillus Coagulans-Inulin (PROBIOTIC) 1-250 BILLION-MG CAPS   . baclofen (LIORESAL) 10 MG tablet Take 10 mg by mouth as needed for muscle spasms.   Marland Kitchen  Calcium Carbonate-Vit D-Min (CALCIUM 1200 PO) Take by mouth daily.  . Cholecalciferol (VITAMIN D PO) Take 800 Units by mouth daily.   Marland Kitchen conjugated estrogens (PREMARIN) vaginal cream Place 1 Applicatorful vaginally 2 (two) times a week.  . denosumab (PROLIA) 60 MG/ML SOSY injection Inject 60 mg into the skin every 6 (six) months.  . DEPAKOTE ER 250 MG 24 hr tablet Take 1 tablet (250 mg total) by mouth daily.  Marland Kitchen FAMOTIDINE PO Take 20 mg by mouth daily.   Marland Kitchen gabapentin (NEURONTIN) 100 MG capsule Take 200 mg by mouth daily.  Marland Kitchen L-LYSINE PO Take 1 tablet by mouth daily.   . Lactobacillus (ACIDOPHILUS) 100 MG CAPS Take 100 mg by mouth daily.  Marland Kitchen lidocaine  (LIDODERM) 5 % Place 1 patch onto the skin as needed. Back pain  . magnesium 30 MG tablet Take 30 mg by mouth every other day.   . meclizine (ANTIVERT) 25 MG tablet Take 25 mg by mouth 3 (three) times daily as needed for dizziness.  . Multiple Vitamin (MULTIVITAMIN) tablet Take 1 tablet by mouth daily.  . ondansetron (ZOFRAN) 4 MG tablet Take 4 mg by mouth every 8 (eight) hours as needed.  . pantoprazole (PROTONIX) 40 MG tablet Take 40 mg by mouth 2 (two) times daily.  . Rimegepant Sulfate (NURTEC) 75 MG TBDP Take 75 mg by mouth daily as needed. For migraines. Take as close to onset of migraine as possible. One daily maximum.  . traMADol (ULTRAM) 5 mg/mL SUSP   . Turmeric (QC TUMERIC COMPLEX PO) Take 117 mg by mouth daily.  . valACYclovir (VALTREX) 500 MG tablet Take 500 mg by mouth 2 (two) times daily as needed (oral herpes).   . vitamin E 180 MG (400 UNITS) capsule Take 400 Units by mouth daily.  . metoprolol tartrate (LOPRESSOR) 100 MG tablet Take 1 tablet (100 mg total) by mouth once for 1 dose. Take 1 tablet 2 hours before your CT scan  . mirtazapine (REMERON) 7.5 MG tablet 1 and 1/2 tablet nightly.  . nefazodone (SERZONE) 150 MG tablet Take 1 tablet (150 mg total) by mouth at bedtime. (Patient not taking: Reported on 04/01/2020)  . vitamin E 400 UNIT capsule Take 200 Units by mouth daily.    No facility-administered encounter medications on file as of 04/05/2020.     Review of Systems  Review of Systems  Constitutional: Negative.  Negative for fatigue and fever.  HENT: Negative.   Respiratory: Negative for cough and shortness of breath.   Cardiovascular: Negative.  Negative for chest pain, palpitations and leg swelling.  Gastrointestinal: Positive for nausea.  Allergic/Immunologic: Negative.   Neurological: Negative.   Psychiatric/Behavioral: Negative.        Physical Exam  BP 110/70 (BP Location: Right Arm)   Pulse 75   Temp (!) 97.1 F (36.2 C)   Ht 5\' 8"  (1.727 m)    Wt 118 lb 0.1 oz (53.5 kg)   SpO2 98%   BMI 17.94 kg/m   Wt Readings from Last 5 Encounters:  04/05/20 118 lb 0.1 oz (53.5 kg)  03/15/20 120 lb 12.8 oz (54.8 kg)  02/23/20 122 lb (55.3 kg)  12/21/19 130 lb (59 kg)  12/04/19 131 lb (59.4 kg)     Physical Exam Vitals and nursing note reviewed.  Constitutional:      General: She is not in acute distress.    Appearance: She is well-developed.  Cardiovascular:     Rate and Rhythm: Normal rate and regular  rhythm.  Pulmonary:     Effort: Pulmonary effort is normal.     Breath sounds: Normal breath sounds.  Abdominal:     General: Abdomen is flat. Bowel sounds are normal. There is no distension.     Palpations: Abdomen is soft.     Tenderness: There is no abdominal tenderness. There is no guarding.  Musculoskeletal:     Right lower leg: No edema.     Left lower leg: No edema.  Neurological:     Mental Status: She is alert and oriented to person, place, and time.  Psychiatric:        Mood and Affect: Mood normal.        Behavior: Behavior normal.      Lab Results:  CBC    Component Value Date/Time   WBC 3.4 (L) 12/21/2019 0949   RBC 3.95 12/21/2019 0949   HGB 12.5 12/21/2019 0949   HCT 37.0 12/21/2019 0949   PLT 160 12/21/2019 0949   MCV 93.7 12/21/2019 0949   MCH 31.6 12/21/2019 0949   MCHC 33.8 12/21/2019 0949   RDW 12.6 12/21/2019 0949   LYMPHSABS 0.7 12/21/2019 0949   MONOABS 0.6 12/21/2019 0949   EOSABS 0.0 12/21/2019 0949   BASOSABS 0.0 12/21/2019 0949    BMET    Component Value Date/Time   NA 134 03/26/2020 1056   K 3.9 03/26/2020 1056   CL 98 03/26/2020 1056   CO2 25 03/26/2020 1056   GLUCOSE 83 03/26/2020 1056   GLUCOSE 98 12/22/2019 1230   BUN 8 03/26/2020 1056   CREATININE 0.79 03/26/2020 1056   CALCIUM 9.4 03/26/2020 1056   GFRNONAA 79 03/26/2020 1056   GFRAA 91 03/26/2020 1056     Imaging: CT CORONARY MORPH W/CTA COR W/SCORE W/CA W/CM &/OR WO/CM  Addendum Date: 03/29/2020   ADDENDUM  REPORT: 03/29/2020 13:05 ADDENDUM: Mild aortic atherosclerosis descending aorta. Electronically Signed   By: Candee Furbish MD   On: 03/29/2020 13:05   Addendum Date: 03/29/2020   ADDENDUM REPORT: 03/29/2020 12:33 CLINICAL DATA:  64 year old with dyspnea post COVID, possible anginal equivalent EXAM: Cardiac/Coronary  CTA TECHNIQUE: The patient was scanned on a [employer]. FINDINGS: A 110 kV prospective scan was triggered in the descending thoracic aorta at 111 HU's. Axial non-contrast 3 mm slices were carried out through the heart. The data set was analyzed on a dedicated work station and scored using the [place]. Gantry rotation speed was 250 msecs and collimation was .6 mm. Beta blockade and 0.8 mg of sl NTG was given. The 3D data set was reconstructed in 5% intervals of the 67-82 % of the R-R cycle. Diastolic phases were analyzed on a dedicated work station using MPR, MIP and VRT modes. The patient received 80 cc of contrast. Aorta:  Normal size.  No calcifications.  No dissection. Aortic Valve:  Trileaflet.  No calcifications. Coronary Arteries:  Normal coronary origin.  Right dominance. RCA is a large dominant artery that gives rise to PDA and PLA. There is no plaque. Left main is a large artery that gives rise to LAD and LCX arteries. LAD is a large vessel that has no plaque. LCX is a non-dominant artery that gives rise to 2 OM branches. There is no plaque. Other findings: Normal pulmonary vein drainage into the left atrium. Normal left atrial appendage without a thrombus. Normal size of the pulmonary artery. Please see radiology report for non cardiac findings. IMPRESSION: 1. Coronary calcium score of 0.  Low risk. 2. Normal coronary origin with right dominance. 3. No evidence of CAD. 4. CAD-RADS 0. No evidence of CAD (0%). Consider non-atherosclerotic causes of chest pain. Candee Furbish, MD [facility] Electronically Signed   By: Candee Furbish MD   On: 03/29/2020 12:33   Result Date:  03/29/2020 EXAM: OVER-READ INTERPRETATION  CT CHEST The following report is an over-read performed by radiologist Dr. Vinnie Langton of [facility], [place] on 03/29/2020. This over-read does not include interpretation of cardiac or coronary anatomy or pathology. The coronary calcium score/coronary CTA interpretation by the cardiologist is attached. COMPARISON:  None. FINDINGS: Aortic atherosclerosis. Within the visualized portions of the thorax there are no suspicious appearing pulmonary nodules or masses, there is no acute consolidative airspace disease, no pleural effusions, no pneumothorax and no lymphadenopathy. Visualized portions of the upper abdomen are unremarkable. There are no aggressive appearing lytic or blastic lesions noted in the visualized portions of the skeleton. Dextroscoliosis of the thoracolumbar spine. IMPRESSION: 1.  Aortic Atherosclerosis (ICD10-I70.0). Electronically Signed: By: Vinnie Langton M.D. On: 03/29/2020 11:52     Assessment & Plan:   History of COVID-19 GI Upset  Nausea Diarrhea:  Most likely post infectious IBS and post infectious gastroparesis   Stay well hydrated  Stay active  May take probiotic  Will order [place] of gallbladder to rule out gallbladder disease  Continue Protonix  May eat 6 bland small meals per day  Hand out given on nutritional rehabilitation  Anxiety:  Continue to follow with psychiatry  Heart Palpitations:  Continue to follow with Cardiology    Follow up:  Follow up to be determined after [place]      Zhyon Antenucci S Kannon Granderson, NP 04/05/2020

## 2020-04-05 NOTE — Patient Instructions (Addendum)
History of Covid 19 GI Upset  Nausea Diarrhea:  Most likely post infectious IBS and post infectious gastroparesis   Stay well hydrated  Stay active  May take probiotic  Will order [place] of gallbladder to rule out gallbladder disease  Continue Protonix  May eat 6 bland small meals per day  Hand out given on nutritional rehabilitation  Anxiety:  Continue to follow with psychiatry  Heart Palpitations:  Continue to follow with Cardiology    Follow up:  Follow up to be determined after [place]   Food Choices to Help Relieve Diarrhea, Adult When you have diarrhea, the foods you eat and your eating habits are very important. Choosing the right foods and drinks can help:  Relieve diarrhea.  Replace lost fluids and nutrients.  Prevent dehydration. What general guidelines should I follow?  Relieving diarrhea  Choose foods with less than 2 g or .07 oz. of fiber per serving.  Limit fats to less than 8 tsp (38 g or 1.34 oz.) a day.  Avoid the following: ? Foods and beverages sweetened with high-fructose corn syrup, honey, or sugar alcohols such as xylitol, sorbitol, and mannitol. ? Foods that contain a lot of fat or sugar. ? Fried, greasy, or spicy foods. ? High-fiber grains, breads, and cereals. ? Raw fruits and vegetables.  Eat foods that are rich in probiotics. These foods include dairy products such as yogurt and fermented milk products. They help increase healthy bacteria in the stomach and intestines (gastrointestinal tract, or GI tract).  If you have lactose intolerance, avoid dairy products. These may make your diarrhea worse.  Take medicine to help stop diarrhea (antidiarrheal medicine) only as told by your health care provider. Replacing nutrients  Eat small meals or snacks every 3-4 hours.  Eat bland foods, such as white rice, toast, or baked potato, until your diarrhea starts to get better. Gradually reintroduce nutrient-rich foods as tolerated or as told by  your health care provider. This includes: ? Well-cooked protein foods. ? Peeled, seeded, and soft-cooked fruits and vegetables. ? Low-fat dairy products.  Take vitamin and mineral supplements as told by your health care provider. Preventing dehydration  Start by sipping water or a special solution to prevent dehydration (oral rehydration solution, ORS). Urine that is clear or pale yellow means that you are getting enough fluid.  Try to drink at least 8-10 cups of fluid each day to help replace lost fluids.  You may add other liquids in addition to water, such as clear juice or decaffeinated sports drinks, as tolerated or as told by your health care provider.  Avoid drinks with caffeine, such as coffee, tea, or soft drinks.  Avoid alcohol. What foods are recommended?     The items listed may not be a complete list. Talk with your health care provider about what dietary choices are best for you. Grains White rice. White, [place], or pita breads (fresh or toasted), including plain rolls, buns, or bagels. White pasta. Saltine, soda, or graham crackers. Pretzels. Low-fiber cereal. Cooked cereals made with water (such as cornmeal, farina, or cream cereals). Plain muffins. Matzo. Melba toast. Zwieback. Vegetables Potatoes (without the skin). Most well-cooked and canned vegetables without skins or seeds. Tender lettuce. Fruits Apple sauce. Fruits canned in juice. Cooked apricots, cherries, grapefruit, peaches, pears, or plums. Fresh bananas and cantaloupe. Meats and other protein foods Baked or boiled chicken. Eggs. Tofu. Fish. Seafood. Smooth nut butters. Ground or well-cooked tender beef, ham, veal, lamb, pork, or poultry. Dairy Plain yogurt,  kefir, and unsweetened liquid yogurt. Lactose-free milk, buttermilk, skim milk, or soy milk. Low-fat or nonfat hard cheese. Beverages Water. Low-calorie sports drinks. Fruit juices without pulp. Strained tomato and vegetable juices. Decaffeinated teas.  Sugar-free beverages not sweetened with sugar alcohols. Oral rehydration solutions, if approved by your health care provider. Seasoning and other foods Bouillon, broth, or soups made from recommended foods. What foods are not recommended? The items listed may not be a complete list. Talk with your health care provider about what dietary choices are best for you. Grains Whole grain, whole wheat, bran, or rye breads, rolls, pastas, and crackers. Wild or brown rice. Whole grain or bran cereals. Barley. Oats and oatmeal. Corn tortillas or taco shells. Granola. Popcorn. Vegetables Raw vegetables. Fried vegetables. Cabbage, broccoli, Brussels sprouts, artichokes, baked beans, beet greens, corn, kale, legumes, peas, sweet potatoes, and yams. Potato skins. Cooked spinach and cabbage. Fruits Dried fruit, including raisins and dates. Raw fruits. Stewed or dried prunes. Canned fruits with syrup. Meat and other protein foods Fried or fatty meats. Deli meats. Chunky nut butters. Nuts and seeds. Beans and lentils. Berniece Salines. Hot dogs. Sausage. Dairy High-fat cheeses. Whole milk, chocolate milk, and beverages made with milk, such as milk shakes. Half-and-half. Cream. sour cream. Ice cream. Beverages Caffeinated beverages (such as coffee, tea, soda, or energy drinks). Alcoholic beverages. Fruit juices with pulp. Prune juice. Soft drinks sweetened with high-fructose corn syrup or sugar alcohols. High-calorie sports drinks. Fats and oils Butter. Cream sauces. Margarine. Salad oils. Plain salad dressings. Olives. Avocados. Mayonnaise. Sweets and desserts Sweet rolls, doughnuts, and sweet breads. Sugar-free desserts sweetened with sugar alcohols such as xylitol and sorbitol. Seasoning and other foods Honey. Hot sauce. Chili powder. Gravy. Cream-based or milk-based soups. Pancakes and waffles. Summary  When you have diarrhea, the foods you eat and your eating habits are very important.  Make sure you get at least  8-10 cups of fluid each day, or enough to keep your urine clear or pale yellow.  Eat bland foods and gradually reintroduce healthy, nutrient-rich foods as tolerated, or as told by your health care provider.  Avoid high-fiber, fried, greasy, or spicy foods. This information is not intended to replace advice given to you by your health care provider. Make sure you discuss any questions you have with your health care provider. Document Revised: 08/22/2018 Document Reviewed: 04/28/2016 Elsevier Patient Education  [place].

## 2020-04-05 NOTE — Assessment & Plan Note (Signed)
GI Upset  Nausea Diarrhea:  Most likely post infectious IBS and post infectious gastroparesis   Stay well hydrated  Stay active  May take probiotic  Will order [place] of gallbladder to rule out gallbladder disease  Continue Protonix  May eat 6 bland small meals per day  Hand out given on nutritional rehabilitation  Anxiety:  Continue to follow with psychiatry  Heart Palpitations:  Continue to follow with Cardiology    Follow up:  Follow up to be determined after [place]

## 2020-04-06 ENCOUNTER — Other Ambulatory Visit: Payer: Self-pay

## 2020-04-06 ENCOUNTER — Ambulatory Visit (HOSPITAL_COMMUNITY): Payer: Managed Care, Other (non HMO) | Attending: Cardiology

## 2020-04-06 DIAGNOSIS — R0789 Other chest pain: Secondary | ICD-10-CM

## 2020-04-06 DIAGNOSIS — R0602 Shortness of breath: Secondary | ICD-10-CM | POA: Insufficient documentation

## 2020-04-06 LAB — ECHOCARDIOGRAM COMPLETE
Area-P 1/2: 2.91 cm2
S' Lateral: 3.2 cm

## 2020-04-07 ENCOUNTER — Telehealth: Payer: Self-pay

## 2020-04-07 NOTE — Telephone Encounter (Signed)
-----   Message from Nuala Alpha, LPN sent at 76/16/0737  8:08 AM EST -----  ----- Message ----- From: Jerline Pain, MD Sent: 04/06/2020   6:34 PM EST To: Shellia Cleverly, RN, [facility] Triage  Reassuring echocardiogram with normal pump function, normal valve structure and function. Candee Furbish, MD

## 2020-04-07 NOTE — Telephone Encounter (Signed)
Reviewed results with patient who verbalized understanding. Pt states that Dr. Marlou Porch want her to start Crestor but pt hasn't started it due to post covid symptoms.Pt said that she will discuss more with Dr. Marlou Porch at next office visit. Pt thanked me for the call.

## 2020-04-11 ENCOUNTER — Other Ambulatory Visit: Payer: Self-pay | Admitting: Psychiatry

## 2020-04-11 NOTE — Telephone Encounter (Signed)
Apt 06/02/19

## 2020-04-14 ENCOUNTER — Ambulatory Visit (HOSPITAL_COMMUNITY)
Admission: RE | Admit: 2020-04-14 | Discharge: 2020-04-14 | Disposition: A | Discharge status: Home / Self Care | Payer: Managed Care, Other (non HMO) | Source: Ambulatory Visit | Attending: Nurse Practitioner | Admitting: Nurse Practitioner

## 2020-04-14 ENCOUNTER — Other Ambulatory Visit: Payer: Self-pay

## 2020-04-14 DIAGNOSIS — R110 Nausea: Secondary | ICD-10-CM | POA: Insufficient documentation

## 2020-04-14 DIAGNOSIS — R197 Diarrhea, unspecified: Secondary | ICD-10-CM | POA: Insufficient documentation

## 2020-04-15 ENCOUNTER — Ambulatory Visit: Payer: PRIVATE HEALTH INSURANCE | Attending: Internal Medicine

## 2020-04-15 DIAGNOSIS — Z23 Encounter for immunization: Secondary | ICD-10-CM

## 2020-04-15 NOTE — Progress Notes (Signed)
1512 Complaints lips felt puffy, slight headache Bp 127/58 pulse 73 02 sat 100%; 1515 BP 104/59 Pulse 77  Discharged home with instructions. Stay completed 30 minutes.

## 2020-04-15 NOTE — Progress Notes (Signed)
   Covid-19 Vaccination Clinic  Name:  KEMYAH BUSER    MRN: [id number] DOB: January 25, 1956  04/15/2020  Ms. Broussard was observed post Covid-19 immunization for 15 minutes without incident. She was provided with Vaccine Information Sheet and instruction to access the V-Safe system.   Ms. Pio was instructed to call 911 with any severe reactions post vaccine: Marland Kitchen Difficulty breathing  . Swelling of face and throat  . A fast heartbeat  . A bad rash all over body  . Dizziness and weakness   Immunizations Administered    Name Date Dose VIS Date Route   JANSSEN COVID-19 VACCINE 04/15/2020  2:37 PM 0.5 mL 03/03/2020 Intramuscular   Manufacturer: [employer]   Lot: [id number]   [place]: [id number]

## 2020-04-19 ENCOUNTER — Other Ambulatory Visit (HOSPITAL_COMMUNITY): Payer: Self-pay | Admitting: Gastroenterology

## 2020-04-19 ENCOUNTER — Other Ambulatory Visit: Payer: Self-pay | Admitting: Gastroenterology

## 2020-04-19 DIAGNOSIS — K8020 Calculus of gallbladder without cholecystitis without obstruction: Secondary | ICD-10-CM

## 2020-04-19 DIAGNOSIS — R110 Nausea: Secondary | ICD-10-CM

## 2020-04-28 ENCOUNTER — Ambulatory Visit: Payer: PRIVATE HEALTH INSURANCE | Admitting: Cardiology

## 2020-04-29 ENCOUNTER — Encounter (HOSPITAL_COMMUNITY)
Admission: RE | Admit: 2020-04-29 | Discharge: 2020-04-29 | Disposition: A | Discharge status: Home / Self Care | Payer: Managed Care, Other (non HMO) | Source: Ambulatory Visit | Attending: Gastroenterology | Admitting: Gastroenterology

## 2020-04-29 ENCOUNTER — Other Ambulatory Visit: Payer: Self-pay

## 2020-04-29 DIAGNOSIS — R110 Nausea: Secondary | ICD-10-CM | POA: Diagnosis present

## 2020-04-29 DIAGNOSIS — K8020 Calculus of gallbladder without cholecystitis without obstruction: Secondary | ICD-10-CM

## 2020-04-29 MED ORDER — TECHNETIUM TC 99M MEBROFENIN IV KIT
5.0000 | PACK | Freq: Once | INTRAVENOUS | Status: AC | PRN
  Administered 2020-04-29: 5 via INTRAVENOUS

## 2020-04-30 ENCOUNTER — Ambulatory Visit (INDEPENDENT_AMBULATORY_CARE_PROVIDER_SITE_OTHER): Payer: PRIVATE HEALTH INSURANCE | Admitting: Cardiology

## 2020-04-30 ENCOUNTER — Encounter: Payer: Self-pay | Admitting: Cardiology

## 2020-04-30 VITALS — BP 110/60 | HR 82 | Ht 68.0 in | Wt 115.0 lb

## 2020-04-30 DIAGNOSIS — I700 Atherosclerosis of aorta: Secondary | ICD-10-CM

## 2020-04-30 NOTE — Progress Notes (Signed)
Cardiology Office Note:    Date:  04/30/2020   ID:  Yolanda Carroll, DOB Mar 07, 1956, MRN [id number]  PCP:  Jonathon Jordan, MD  [facility] Cardiologist:  No primary care provider on file.  [facility] Electrophysiologist:  None   Referring MD: Jonathon Jordan, MD     History of Present Illness:    Yolanda Carroll is a 64 y.o. female here for follow-up cardiac testing.  Previous Covid infection August 2021.  Regeneron infusion.  Has been seen in the post Covid clinic.  Has had ongoing nausea, weight loss, anxiety.  Several different specialists have seen.  Gallstones are noted but HIDA scan was normal.  Past Medical History:  Diagnosis Date  . Allergy   . Arthritis   . Cervical dysplasia   . Endometriosis   . Fatigue   . GERD (gastroesophageal reflux disease)   . IBS (irritable bowel syndrome)   . Migraine   . Osteoporosis 01/2019   T score -3.1  . Plantar fasciitis   . PONV (postoperative nausea and vomiting)    also difficult to wake up  . Recurrent vaginitis   . Reflux   . Scoliosis   . Wears glasses     Past Surgical History:  Procedure Laterality Date  . ANTERIOR CERVICAL DECOMP/DISCECTOMY FUSION  04/17/2011   Procedure: ANTERIOR CERVICAL DECOMPRESSION/DISCECTOMY FUSION 2 LEVELS;  Surgeon: Hosie Spangle;  Location: [place] [facility];  Service: Neurosurgery;  Laterality: N/A;  Cervical five-six, six-seven anterior cervical decompression with fusion,  plating,  and bonegraft   . APPENDECTOMY  1978  . BREAST BIOPSY  07/13/2011   Procedure: BREAST BIOPSY WITH NEEDLE LOCALIZATION;  Surgeon: Rolm Bookbinder, MD;  Location: [facility];  Service: General;  Laterality: Right;  Right breast wire localization biopsy  . BREAST EXCISIONAL BIOPSY Right 2013  . BREAST SURGERY     Breast Bx-Benign  . CYSTOSCOPY    . GYNECOLOGIC CRYOSURGERY    . HERNIA REPAIR  08/02/1995   [facility]  . LAPAROSCOPIC ENDOMETRIOSIS FULGURATION  1997  . PELVIC LAPAROSCOPY     . ROTATOR CUFF REPAIR     right 2002 left 2000    Current Medications: Current Meds  Medication Sig  . AIMOVIG 140 MG/ML SOAJ INJECT 140 MG INTO SKIN EVERY 30 DAYS  . ALPRAZolam (XANAX) 0.25 MG tablet TAKE ONE TABLET BY MOUTH TWICE A DAY  . Ascorbic Acid (VITAMIN C PO) Take 1 tablet by mouth daily. 500mg  capsule  . Bacillus Coagulans-Inulin (PROBIOTIC) 1-250 BILLION-MG CAPS   . baclofen (LIORESAL) 10 MG tablet Take 10 mg by mouth as needed for muscle spasms.   . Calcium Carbonate-Vit D-Min (CALCIUM 1200 PO) Take by mouth daily.  . Cholecalciferol (VITAMIN D PO) Take 800 Units by mouth daily.  Marland Kitchen conjugated estrogens (PREMARIN) vaginal cream Place 1 Applicatorful vaginally 2 (two) times a week.  . denosumab (PROLIA) 60 MG/ML SOSY injection Inject 60 mg into the skin every 6 (six) months.  . DEPAKOTE ER 250 MG 24 hr tablet Take 1 tablet (250 mg total) by mouth daily.  Marland Kitchen FAMOTIDINE PO Take 20 mg by mouth daily.   Marland Kitchen gabapentin (NEURONTIN) 100 MG capsule Take 200 mg by mouth daily.  Marland Kitchen L-LYSINE PO Take 1 tablet by mouth daily.   . Lactobacillus (ACIDOPHILUS) 100 MG CAPS Take 100 mg by mouth daily.  Marland Kitchen lidocaine (LIDODERM) 5 % Place 1 patch onto the skin as needed. Back pain  . magnesium 30 MG tablet Take  30 mg by mouth every other day.   . meclizine (ANTIVERT) 25 MG tablet Take 25 mg by mouth 3 (three) times daily as needed for dizziness.  . Multiple Vitamin (MULTIVITAMIN) tablet Take 1 tablet by mouth daily.  . ondansetron (ZOFRAN) 4 MG tablet Take 4 mg by mouth every 8 (eight) hours as needed.  . pantoprazole (PROTONIX) 40 MG tablet Take 40 mg by mouth 2 (two) times daily.  . Rimegepant Sulfate (NURTEC) 75 MG TBDP Take 75 mg by mouth daily as needed. For migraines. Take as close to onset of migraine as possible. One daily maximum.  . traMADol (ULTRAM) 5 mg/mL SUSP   . Turmeric (QC TUMERIC COMPLEX PO) Take 117 mg by mouth daily.  . valACYclovir (VALTREX) 500 MG tablet Take 500 mg by mouth 2  (two) times daily as needed (oral herpes).   . vitamin E 180 MG (400 UNITS) capsule Take 400 Units by mouth daily.     Allergies:   Penicillins, Atarax [hydroxyzine], Celexa [citalopram hydrobromide], Erythromycin, Flagyl [metronidazole], Nortriptyline, Prilosec [omeprazole], Requip [ropinirole], Tramadol, Darvocet [propoxyphene n-acetaminophen], and Percocet [oxycodone-acetaminophen]   Social History   Socioeconomic History  . Marital status: Married    Spouse name: Fritz Pickerel  . Number of children: 0  . Years of education: 61  . Highest education level: Not on file  Occupational History    Comment: [employer]  Tobacco Use  . Smoking status: Former Smoker    Quit date: 05/17/1983    Years since quitting: 36.9  . Smokeless tobacco: Never Used  Vaping Use  . Vaping Use: Never used  Substance and Sexual Activity  . Alcohol use: Yes    Alcohol/week: 1.0 - 2.0 standard drink    Types: 1 - 2 Standard drinks or equivalent per week    Comment: socially, occasional  . Drug use: No  . Sexual activity: Yes    Birth control/protection: Post-menopausal    Comment: intercourse age 89 , sexual partners more than 5  Other Topics Concern  . Not on file  Social History Narrative   Pt is married, no children.  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    Caffeine- very little.   Right handed   Lives at home with her husband   Social Determinants of Health   Financial Resource Strain: Not on file  Food Insecurity: Not on file  Transportation Needs: Not on file  Physical Activity: Not on file  Stress: Not on file  Social Connections: Not on file     Family History: The patient's family history includes Bipolar disorder in her mother; Breast cancer in her maternal aunt; Breast cancer (age of onset: 4) in her cousin; Cancer in her father, maternal aunt, and paternal aunt; Diabetes in her maternal aunt; Other in her brother and mother.   EKGs/Labs/Other  Studies Reviewed:    The following studies were reviewed today:  Cardiac CT 03/29/20: ADDENDUM: Mild aortic atherosclerosis descending aorta.   Electronically Signed   By: Candee Furbish MD   On: 03/29/2020 13:05   Addended by Jerline Pain, MD on 03/29/2020 1:08 PM    ADDENDUM REPORT: 03/29/2020 12:33  CLINICAL DATA:  64 year old with dyspnea post COVID, possible anginal equivalent  EXAM: Cardiac/Coronary  CTA  TECHNIQUE: The patient was scanned on a [employer].  FINDINGS: A 110 kV prospective scan was triggered in the descending thoracic aorta at 111 HU's. Axial non-contrast 3 mm slices were carried out through  the heart. The data set was analyzed on a dedicated work station and scored using the Elida. Gantry rotation speed was 250 msecs and collimation was .6 mm. Beta blockade and 0.8 mg of sl NTG was given. The 3D data set was reconstructed in 5% intervals of the 67-82 % of the R-R cycle. Diastolic phases were analyzed on a dedicated work station using MPR, MIP and VRT modes. The patient received 80 cc of contrast.  Aorta:  Normal size.  No calcifications.  No dissection.  Aortic Valve:  Trileaflet.  No calcifications.  Coronary Arteries:  Normal coronary origin.  Right dominance.  RCA is a large dominant artery that gives rise to PDA and PLA. There is no plaque.  Left main is a large artery that gives rise to LAD and LCX arteries.  LAD is a large vessel that has no plaque.  LCX is a non-dominant artery that gives rise to 2 OM branches. There is no plaque.  Other findings:  Normal pulmonary vein drainage into the left atrium.  Normal left atrial appendage without a thrombus.  Normal size of the pulmonary artery.  Please see radiology report for non cardiac findings.  IMPRESSION: 1. Coronary calcium score of 0.  Low risk.  2. Normal coronary origin with right dominance.  3. No evidence of CAD.  4.  CAD-RADS 0. No evidence of CAD (0%). Consider non-atherosclerotic causes of chest pain.  Candee Furbish, MD [facility]      ECHO 04/06/20:   1. Left ventricular ejection fraction, by estimation, is 55%. The left  ventricle has normal function. The left ventricle has no regional wall  motion abnormalities. Left ventricular diastolic parameters are consistent  with Grade I diastolic dysfunction  (impaired relaxation). The average left ventricular global longitudinal  strain is -18.6 %. The global longitudinal strain is normal.  2. Right ventricular systolic function is normal. The right ventricular  size is normal.  3. The mitral valve is normal in structure. Trivial mitral valve  regurgitation. No evidence of mitral stenosis.  4. The aortic valve is tricuspid. Aortic valve regurgitation is not  visualized. No aortic stenosis is present.  5. The inferior vena cava is normal in size with greater than 50%  respiratory variability, suggesting right atrial pressure of 3 mmHg.     Recent Labs: 12/21/2019: ALT 24; Hemoglobin 12.5; Platelets 160 03/26/2020: BUN 8; Creatinine, Ser 0.79; Potassium 3.9; Sodium 134  Recent Lipid Panel No results found for: CHOL, TRIG, HDL, CHOLHDL, VLDL, LDLCALC, LDLDIRECT   Risk Assessment/Calculations:       Physical Exam:    VS:  BP 110/60 (BP Location: Left Arm, Patient Position: Sitting, Cuff Size: Normal)   Pulse 82   Ht 5\' 8"  (1.727 m)   Wt 115 lb (52.2 kg)   SpO2 98%   BMI 17.49 kg/m     Wt Readings from Last 3 Encounters:  04/30/20 115 lb (52.2 kg)  04/05/20 118 lb 0.1 oz (53.5 kg)  03/15/20 120 lb 12.8 oz (54.8 kg)     GEN: Thin in no acute distress HEENT: Normal NECK: No JVD; No carotid bruits LYMPHATICS: No lymphadenopathy CARDIAC: RRR, no murmurs, rubs, gallops RESPIRATORY:  Clear to auscultation without rales, wheezing or rhonchi  ABDOMEN: Soft, non-tender, non-distended MUSCULOSKELETAL:  No edema; No deformity  SKIN: Warm  and dry NEUROLOGIC:  Alert and oriented x 3 PSYCHIATRIC:  Normal affect   ASSESSMENT:    1. Aortic atherosclerosis ([place])    PLAN:  In order of problems listed above:  Aortic atherosclerosis.   -Noted on CT scan.  Had encouraged initiation of statin, Crestor however given her ongoing post COVID nausea, weight loss, will I am comfortable with her holding off now. -Please let us know if we can be of further assistance.  Coronary CT, echocardiogram reassuring.  Ongoing nausea.  Hopefully with time, the symptoms will continue to improve.    Medication Adjustments/Labs and Tests Ordered: Current medicines are reviewed at length with the patient today.  Concerns regarding medicines are outlined above.  No orders of the defined types were placed in this encounter.  No orders of the defined types were placed in this encounter.   Patient Instructions  Medication Instructions:  The current medical regimen is effective;  continue present plan and medications.  *If you need a refill on your cardiac medications before your next appointment, please call your pharmacy*  Follow-Up: At [facility], you and your health needs are our priority.  As part of our continuing mission to provide you with exceptional heart care, we have created designated Provider Care Teams.  These Care Teams include your primary Cardiologist (physician) and Advanced Practice Providers (APPs -  Physician Assistants and Nurse Practitioners) who all work together to provide you with the care you need, when you need it.  We recommend signing up for the patient portal called "MyChart".  Sign up information is provided on this After Visit Summary.  MyChart is used to connect with patients for Virtual Visits (Telemedicine).  Patients are able to view lab/test results, encounter notes, upcoming appointments, etc.  Non-urgent messages can be sent to your provider as well.   To learn more about what you can do with MyChart,  go to [email or web address].    Your next appointment:   Follow up as needed.  Thank you for choosing [facility]!!        Signed, Candee Furbish, MD  04/30/2020 3:50 PM    [facility]

## 2020-04-30 NOTE — Patient Instructions (Signed)
Medication Instructions:  °The current medical regimen is effective;  continue present plan and medications. ° °*If you need a refill on your cardiac medications before your next appointment, please call your pharmacy* ° °Follow-Up: °At [facility], you and your health needs are our priority.  As part of our continuing mission to provide you with exceptional heart care, we have created designated Provider Care Teams.  These Care Teams include your primary Cardiologist (physician) and Advanced Practice Providers (APPs -  Physician Assistants and Nurse Practitioners) who all work together to provide you with the care you need, when you need it. ° °We recommend signing up for the patient portal called "MyChart".  Sign up information is provided on this After Visit Summary.  MyChart is used to connect with patients for Virtual Visits (Telemedicine).  Patients are able to view lab/test results, encounter notes, upcoming appointments, etc.  Non-urgent messages can be sent to your provider as well.   °To learn more about what you can do with MyChart, go to [email or web address].   ° °Your next appointment:   °Follow up as needed. ° °Thank you for choosing [facility]!! ° ° ° °

## 2020-05-11 ENCOUNTER — Ambulatory Visit: Payer: 59

## 2020-05-13 ENCOUNTER — Ambulatory Visit (INDEPENDENT_AMBULATORY_CARE_PROVIDER_SITE_OTHER): Payer: PRIVATE HEALTH INSURANCE

## 2020-05-13 ENCOUNTER — Other Ambulatory Visit: Payer: Self-pay

## 2020-05-13 DIAGNOSIS — M810 Age-related osteoporosis without current pathological fracture: Secondary | ICD-10-CM | POA: Diagnosis not present

## 2020-05-13 MED ORDER — DENOSUMAB 60 MG/ML ~~LOC~~ SOSY
60.0000 mg | PREFILLED_SYRINGE | Freq: Once | SUBCUTANEOUS | Status: AC
  Administered 2020-05-13: 60 mg via SUBCUTANEOUS

## 2020-05-13 NOTE — Progress Notes (Signed)
Prolia injection administered. Patient tolerated well.  

## 2020-05-21 ENCOUNTER — Encounter: Payer: Self-pay | Admitting: Internal Medicine

## 2020-05-24 ENCOUNTER — Other Ambulatory Visit: Payer: Self-pay | Admitting: Psychiatry

## 2020-05-25 ENCOUNTER — Telehealth: Payer: Self-pay | Admitting: Psychiatry

## 2020-05-25 NOTE — Telephone Encounter (Signed)
Pt may need a PA done for her Depakote. Pt will go online to see if good rx card will help also. Pt insurance info is updated.

## 2020-05-27 ENCOUNTER — Other Ambulatory Visit: Payer: Self-pay

## 2020-05-27 MED ORDER — DIVALPROEX SODIUM ER 250 MG PO TB24: 250.0000 mg | ORAL_TABLET | Freq: Every day | ORAL | 1 refills | Status: DC

## 2020-05-27 NOTE — Telephone Encounter (Signed)
Patient called back and reports she has new insurance so the BRAND Depakote is not on her formulary. Patient requests to try the generic to see if it will help her acid reflux. If not she would switch back to [facility] and get a PA.   Rx for generic Divalproex ER 250 mg daily to [facility]  Dr. Clovis Pu aware

## 2020-05-27 NOTE — Telephone Encounter (Signed)
Left patient vm to call back if needing a PA and discuss the depakote

## 2020-05-28 ENCOUNTER — Other Ambulatory Visit: Payer: Self-pay | Admitting: Psychiatry

## 2020-05-28 DIAGNOSIS — F320 Major depressive disorder, single episode, mild: Secondary | ICD-10-CM

## 2020-05-28 DIAGNOSIS — F32A Depression, unspecified: Secondary | ICD-10-CM

## 2020-05-28 DIAGNOSIS — F5105 Insomnia due to other mental disorder: Secondary | ICD-10-CM

## 2020-05-28 DIAGNOSIS — F411 Generalized anxiety disorder: Secondary | ICD-10-CM

## 2020-05-28 DIAGNOSIS — F410 Panic disorder [episodic paroxysmal anxiety] without agoraphobia: Secondary | ICD-10-CM

## 2020-06-01 ENCOUNTER — Encounter: Payer: Self-pay | Admitting: Psychiatry

## 2020-06-01 ENCOUNTER — Telehealth (INDEPENDENT_AMBULATORY_CARE_PROVIDER_SITE_OTHER): Payer: 59 | Admitting: Psychiatry

## 2020-06-01 DIAGNOSIS — F5105 Insomnia due to other mental disorder: Secondary | ICD-10-CM | POA: Diagnosis not present

## 2020-06-01 DIAGNOSIS — F410 Panic disorder [episodic paroxysmal anxiety] without agoraphobia: Secondary | ICD-10-CM | POA: Diagnosis not present

## 2020-06-01 DIAGNOSIS — F32A Depression, unspecified: Secondary | ICD-10-CM

## 2020-06-01 DIAGNOSIS — G43009 Migraine without aura, not intractable, without status migrainosus: Secondary | ICD-10-CM

## 2020-06-01 DIAGNOSIS — F411 Generalized anxiety disorder: Secondary | ICD-10-CM | POA: Diagnosis not present

## 2020-06-01 DIAGNOSIS — K219 Gastro-esophageal reflux disease without esophagitis: Secondary | ICD-10-CM

## 2020-06-01 DIAGNOSIS — F320 Major depressive disorder, single episode, mild: Secondary | ICD-10-CM

## 2020-06-01 NOTE — Progress Notes (Signed)
Yolanda Carroll [id number] 1955/09/13 65 y.o.  Video Visit via My Chart  I connected with pt by My Chart and verified that I am speaking with the correct person using two identifiers.   I discussed the limitations, risks, security and privacy concerns of performing an evaluation and management service by My Chart  and the availability of in person appointments. I also discussed with the patient that there may be a patient responsible charge related to this service. The patient expressed understanding and agreed to proceed.  I discussed the assessment and treatment plan with the patient. The patient was provided an opportunity to ask questions and all were answered. The patient agreed with the plan and demonstrated an understanding of the instructions.   The patient was advised to call back or seek an in-person evaluation if the symptoms worsen or if the condition fails to improve as anticipated.  I provided 30 minutes of video time during this encounter.  The patient was located at home and the provider was located home DT weather.  Session started at 1115 and ended at 1145.   Subjective:   Patient ID:  Yolanda Carroll is a 65 y.o. (DOB August 28, 1955) female.  Chief Complaint:  Chief Complaint  Patient presents with  . Follow-up  . Stress    Health problems  . Anxiety    Anxiety Patient reports no confusion, decreased concentration, nausea, nervous/anxious behavior or suicidal ideas.     Yolanda Carroll presents to the office today for follow-up of anxiety and migraine.  seen in July 2020.  No meds were changed.  She requires name brand Depakote because the generic caused worsening headaches.  Retired November 14, 2018.  Was so burned out at work.  Helping with church projects and helping friends.  Focusing on her health and doing PT for back and hip.  No kids or gkids.  Helps care for 62 yo M-in-law.  H is also busy which helps.  Given a surprise retirement party.     seen  January 2021.  Nefazodone is no longer manufactured and she had to wean off.  We also discussed the potential of weaning off Depakote because Aimovig had been very effective at managing her migraine headache.  She called August 11, 2019 stating that she did taper off of Depakote because she did not feel like she needed it any more.  Her last dose was July 23, 2019 but afterwards noticed heartburn and swallowing issues.  Her GI doctor added Pepcid 40 mg daily.  Her neurologist had indicated that Depakote can sometimes help with esophageal issues and she wondered if that was connected.  She was given the option to restart a lower dose and perhaps taper more slowly.  Off Depakote for a month.  She elected not to restart it.  08/19/2019 appointment the following is noted: Tinnitus and mild anxiety worse off the Depakote and with some mind racing esp in the AM.   Most is better. Reflux is worse off Depakote ER 250 and wondering if she should restart it.   Disc article on Depakote helping GERD by increasing lower esophageal sphincter tone. Has travelled some with family events.  M-in-law 35 soon.   HA good with Aimovig. Don't do season changes well and more HA in Spring often. Sleep not great lately with leg cramps.  Started more exercise.  Started snoring and H is a light sleeper.  Meds help. Plan no med changes.  10/03/2019 phone call with patient asking  to restart residual nefazodone that she has on hand.  She felt that it helped back pain and she is experiencing more back pain recently.  She is aware that it is no longer manufactured to our knowledge and once she runs out it will not be available.  She indicated she had an off to take half of 150 mg nefazodone tablet for about 2 to 3 months and would like to do so.  11/05/2019 appointment with the following noted:  Appt moved earlier DT the following. Had stopped Depakote and nefazadone and reflux got worse.  Restarting Depakote didn't help much.   Restarted nefazodaone and reflux better right away.  May not need Depakote but doesn't want to change.  Can't exercise DT back pain since Jan ans worse since March.  Getting new doctor.  More depressed and anxious and sleep problems DT back pain.   Usually only 0.25 mg alprazolam at night. Plan: has to stop nefazodone when it runs out bc no longer manufactured.  Therefore will try trazodone in it's place.  01/07/20 appt with the following noted: She and H both got Covid.  Was pretty sick for 10 days.  Lost taste.  Had little resp stuff but severe diarrhea and nausea and couldn't keep fluids down.  No residual sx. H had resp sx with pneumonia and had to get O2.  Hosp for 3 days. Still have some nefazodone and nursing it along. Tried trazodone 50 HS for 2 nights and didn't sleep well and had a HA  Nefazodone and Depakote both helped the GERD.  She has enough nefazadone for about 6 weeks. Plan: Nefazodone helped the reflux and she wants to take it as long as possible.  She didn't have withdrawal off nefazodone. When she runs out then start trazodone.  If that fails then use Viibryd.  Disc SE and alternatives.   Rec try trazodone again but use 100 mg and see if sleeping better prevents the AM HA.  If HA recurs then viibryd. Starter kit.  Depakote didn't help as much as expected and she might stop it.   02/18/2020 phone call: Yolanda Carroll called to report that she is experiencing high anxiety.  It is causing chest discomfort and breathing problems. Has appt 11/18 and is on the wait list but needs to discuss how to relief this anxiety.  Please call. Response: Note Rtc to patient, she does have the Viibryd and does remember that discussion. She has not been able to start that because she received a steroid injection a couple weeks ago and caused her stomach to be upset. She didn't want to start it knowing that's a side effect. She feels like the steroid injection may have worsened all her symptoms. She did try the  trazodone but it was too much for her, even taking a 1/2 tablet. She does have some serzone left and has been taking that until this improves. Also taking Xanax as needed during day if needed. Hoping all of this will improve soon. Informed her I would update Dr. Clovis Pu with her information.       04/01/2020 appointment with the following noted: Out of nefazodone a few weeks ago.  More reflux off it.  Tried trazodone a couple of times.  Tried 100 mg trazodone and heart skipped.  50 mg awoke with chest tightness.  Long haul Covid with GI sx.  Nausea and anxiety problems daily since mid September.  Covid early August and got monoclonal infusion which helped.  Headed to  long haul Covid clinic in [place] Monday.   Needed to increase alprazolam to BID.  And 80% better with that.  Panic some out of bed.  Sx started after steroid shot for her back 9/21.   Hasn't tried Viibryd DT fear of GI worsening.  Poor appetite and lost 15#. Not taken tramadol yet. Migraine under control. Plan: no med changes  06/01/20 appt with following noted: Starting generic Depakote ER generic today. Started Mirtazapine 3.75 mg and it worked right away for nausea for a week.  Then increased to 7.5 mg HS.  Most days is feeling really well.  No SE except mild drowsiness. Still has post Covid problems with stomach but much better.  Regained 7# of needed weight.  Need to try to gain 10# more. Zofran failed. Had a lot anxiety with N but it's better now.  Was waking with panic and no longer N and anxiety better too. Severe scoliosis facing big surgery in summer with long recovery.  Can't ride in car for long.  Patient denies difficulty with sleep initiation with Xanax.. Denies appetite disturbance.  Patient reports that energy and motivation have been good.  Patient denies any difficulty with concentration.  Patient denies any suicidal ideation. More depressed and tearful with pain but varies with pain levels.  Good and bad days.  Not  too depressed with anxiety worse.  Insomnia managed with  With alprazolam.    Saw neurologist for Migraine and had problems with the meds RX.  Aimovig has helped..    Past Psychiatric Medication Trials: Failed multiple other antidepressants, nefazodone & Depakote for migraine,  Mirtazapine 7.5 mg benefit citalopram palpitations trazodone  Review of Systems:  Review of Systems  Gastrointestinal: Negative for abdominal pain and nausea.  Musculoskeletal: Positive for arthralgias and back pain.  Neurological: Negative for tremors, weakness and headaches.  Psychiatric/Behavioral: Negative for agitation, behavioral problems, confusion, decreased concentration, dysphoric mood, hallucinations, self-injury, sleep disturbance and suicidal ideas. The patient is not nervous/anxious and is not hyperactive.     Medications: I have reviewed the patient's current medications.  Current Outpatient Medications  Medication Sig Dispense Refill  . AIMOVIG 140 MG/ML SOAJ INJECT 140 MG INTO SKIN EVERY 30 DAYS 3 pen 3  . ALPRAZolam (XANAX) 0.25 MG tablet TAKE ONE TABLET BY MOUTH TWICE A DAY 60 tablet 5  . Ascorbic Acid (VITAMIN C PO) Take 1 tablet by mouth daily. 568m capsule    . Bacillus Coagulans-Inulin (PROBIOTIC) 1-250 BILLION-MG CAPS     . baclofen (LIORESAL) 10 MG tablet Take 10 mg by mouth as needed for muscle spasms.     . Calcium Carbonate-Vit D-Min (CALCIUM 1200 PO) Take by mouth daily.    . Cholecalciferol (VITAMIN D PO) Take 800 Units by mouth daily.    .Marland Kitchenconjugated estrogens (PREMARIN) vaginal cream Place 1 Applicatorful vaginally 2 (two) times a week. 42.5 g 5  . denosumab (PROLIA) 60 MG/ML SOSY injection Inject 60 mg into the skin every 6 (six) months.    . divalproex (DEPAKOTE ER) 250 MG 24 hr tablet Take 1 tablet (250 mg total) by mouth daily. 30 tablet 1  . gabapentin (NEURONTIN) 100 MG capsule Take 200 mg by mouth daily.    .Marland KitchenL-LYSINE PO Take 1 tablet by mouth daily.     .  Lactobacillus (ACIDOPHILUS) 100 MG CAPS Take 100 mg by mouth daily.    .Marland Kitchenlidocaine (LIDODERM) 5 % Place 1 patch onto the skin as needed. Back pain    .  magnesium 30 MG tablet Take 30 mg by mouth every other day.     . meclizine (ANTIVERT) 25 MG tablet Take 25 mg by mouth 3 (three) times daily as needed for dizziness.    . mirtazapine (REMERON) 7.5 MG tablet TAKE 1.5 TABLETS BY MOUTH NIGHTLY *DISCONTINUE TRAZODONE* 45 tablet 0  . Multiple Vitamin (MULTIVITAMIN) tablet Take 1 tablet by mouth daily.    . pantoprazole (PROTONIX) 40 MG tablet Take 40 mg by mouth 2 (two) times daily.    . Rimegepant Sulfate (NURTEC) 75 MG TBDP Take 75 mg by mouth daily as needed. For migraines. Take as close to onset of migraine as possible. One daily maximum. 4 tablet 0  . traMADol (ULTRAM) 5 mg/mL SUSP     . Turmeric (QC TUMERIC COMPLEX PO) Take 117 mg by mouth daily.    . valACYclovir (VALTREX) 500 MG tablet Take 500 mg by mouth 2 (two) times daily as needed (oral herpes).     . vitamin E 180 MG (400 UNITS) capsule Take 400 Units by mouth daily.    Marland Kitchen FAMOTIDINE PO Take 20 mg by mouth daily.      No current facility-administered medications for this visit.    Medication Side Effects: None  Allergies:  Allergies  Allergen Reactions  . Penicillins Other (See Comments)    Seizure as a child  . Atarax [Hydroxyzine] Other (See Comments)    Headache, depression  . Celexa [Citalopram Hydrobromide] Other (See Comments)    Chest pain  . Erythromycin      Upset stomache  . Flagyl [Metronidazole]     Dizzy and increased heart rate  . Nortriptyline Itching  . Prilosec [Omeprazole]     Abdominal pain  . Requip [Ropinirole]     Made sx worse  . Tramadol Itching  . Darvocet [Propoxyphene N-Acetaminophen] Itching  . Percocet [Oxycodone-Acetaminophen] Itching    Past Medical History:  Diagnosis Date  . Allergy   . Arthritis   . Cervical dysplasia   . Endometriosis   . Fatigue   . GERD (gastroesophageal  reflux disease)   . IBS (irritable bowel syndrome)   . Migraine   . Osteoporosis 01/2019   T score -3.1  . Plantar fasciitis   . PONV (postoperative nausea and vomiting)    also difficult to wake up  . Recurrent vaginitis   . Reflux   . Scoliosis   . Wears glasses     Family History  Problem Relation Age of Onset  . Cancer Father        lymphoma  . Other Mother        bipolar,reflux  . Bipolar disorder Mother   . Other Brother        sinus problems  . Cancer Maternal Aunt        uterine cancer  . Breast cancer Maternal Aunt        40's  . Diabetes Maternal Aunt   . Cancer Paternal Aunt        Colon cancer  . Breast cancer Cousin 26       Mat. 1st cousin    Social History   Socioeconomic History  . Marital status: Married    Spouse name: Fritz Pickerel  . Number of children: 0  . Years of education: 5  . Highest education level: Not on file  Occupational History    Comment: [employer]  Tobacco Use  . Smoking status: Former Smoker    Quit date:  05/17/1983    Years since quitting: 37.0  . Smokeless tobacco: Never Used  Vaping Use  . Vaping Use: Never used  Substance and Sexual Activity  . Alcohol use: Yes    Alcohol/week: 1.0 - 2.0 standard drink    Types: 1 - 2 Standard drinks or equivalent per week    Comment: socially, occasional  . Drug use: No  . Sexual activity: Yes    Birth control/protection: Post-menopausal    Comment: intercourse age 60 , sexual partners more than 5  Other Topics Concern  . Not on file  Social History Narrative   Pt is married, no children.  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    Caffeine- very little.   Right handed   Lives at home with her husband   Social Determinants of Health   Financial Resource Strain: Not on file  Food Insecurity: Not on file  Transportation Needs: Not on file  Physical Activity: Not on file  Stress: Not on file  Social Connections: Not on file  Intimate Partner  Violence: Not on file    Past Medical History, Surgical history, Social history, and Family history were reviewed and updated as appropriate.   Please see review of systems for further details on the patient's review from today.   Objective:   Physical Exam:  There were no vitals taken for this visit.  Physical Exam Neurological:     Mental Status: She is alert and oriented to person, place, and time.     Cranial Nerves: No dysarthria.  Psychiatric:        Attention and Perception: Attention and perception normal.        Mood and Affect: Mood is not anxious or depressed. Affect is not tearful.        Speech: Speech normal.        Behavior: Behavior is cooperative.        Thought Content: Thought content normal. Thought content is not paranoid or delusional. Thought content does not include homicidal or suicidal ideation. Thought content does not include homicidal or suicidal plan.        Cognition and Memory: Cognition and memory normal.        Judgment: Judgment normal.     Comments: Insight intact Feels much better      Lab Review:     Component Value Date/Time   NA 134 03/26/2020 1056   K 3.9 03/26/2020 1056   CL 98 03/26/2020 1056   CO2 25 03/26/2020 1056   GLUCOSE 83 03/26/2020 1056   GLUCOSE 98 12/22/2019 1230   BUN 8 03/26/2020 1056   CREATININE 0.79 03/26/2020 1056   CALCIUM 9.4 03/26/2020 1056   PROT 7.6 12/21/2019 0949   ALBUMIN 4.3 12/21/2019 0949   AST 24 12/21/2019 0949   ALT 24 12/21/2019 0949   ALKPHOS 42 12/21/2019 0949   BILITOT 0.4 12/21/2019 0949   GFRNONAA 79 03/26/2020 1056   GFRAA 91 03/26/2020 1056       Component Value Date/Time   WBC 3.4 (L) 12/21/2019 0949   RBC 3.95 12/21/2019 0949   HGB 12.5 12/21/2019 0949   HCT 37.0 12/21/2019 0949   PLT 160 12/21/2019 0949   MCV 93.7 12/21/2019 0949   MCH 31.6 12/21/2019 0949   MCHC 33.8 12/21/2019 0949   RDW 12.6 12/21/2019 0949   LYMPHSABS 0.7 12/21/2019 0949   MONOABS 0.6 12/21/2019 0949    EOSABS 0.0 12/21/2019 0949   BASOSABS 0.0 12/21/2019 0949  Normal liver enzymes in September.  No results found for: POCLITH, LITHIUM   No results found for: PHENYTOIN, PHENOBARB, VALPROATE, CBMZ   .res Assessment: Plan:    Yolanda Carroll was seen today for follow-up, stress and anxiety.  Diagnoses and all orders for this visit:  Generalized anxiety disorder  Panic disorder without agoraphobia  Insomnia due to mental condition  Migraine without aura and without status migrainosus, not intractable  Gastroesophageal reflux disease without esophagitis  Mild depression (HCC)  Greater than 50% of 30 min non face to face time with patient was spent on counseling and coordination of care. We discussed the following.  Much better with mirtazapine 7.5 mg HS .  Nausea, depression and anxiety Are all much better.  Cont trial Depakote for reflux.  It seeemed to help before HA still managed with Aimovig.  We discussed the short-term risks associated with benzodiazepines including sedation and increased fall risk among others.  Discussed long-term side effect risk including dependence, potential withdrawal symptoms, and the potential eventual dose-related risk of dementia. Disc newer studies that cast in doubt the relationship with dementia. Continue Xanax 0.25 mg BID for panic and GAD   Continue mirtazapine 7.5 mg with Xanax for GI problems including nausea and appetite.  It has worked well.  Nefazodone helped the reflux but ran out.  She didn't have withdrawal off nefazodone.   Consider Genesight testing bc med sensitivity and failures.  No med changes today except generic Depakote  This was a 30-minute appointment  FU  3 mos  Lynder Parents, MD, DFAPA  Please see After Visit Summary for patient specific instructions.  Future Appointments  Date Time Provider [place]  08/04/2020 10:00 AM Debbora Presto, NP [facility] None  08/18/2020  4:00 PM Cottle, Billey Co., MD [facility] None   12/02/2020  9:20 AM Philemon Kingdom, MD [facility] None  02/22/2021 11:00 AM Philemon Kingdom, MD [facility] None    No orders of the defined types were placed in this encounter.     -------------------------------

## 2020-06-02 NOTE — Telephone Encounter (Signed)
Spoke with [facility] and was told the Aimovig went through and the copay is $150. They will fill it for the patient. I called the pt and let her know. She was appreciative. She will look into the [place]. I will send info to her through mychart.

## 2020-06-02 NOTE — Telephone Encounter (Signed)
Completed Aimovig PA on Cover My Meds. Key: [id number]. Awaiting determination from [place].

## 2020-06-02 NOTE — Telephone Encounter (Signed)
The request has been approved. The authorization is effective for a maximum of 6 fills from 06/02/2020 to 11/29/2020, as long as the member is enrolled in their current health plan. This has been approved for a quantity limit of 1.0 with a day supply limit of 30.0. A written notification letter will follow with additional details.

## 2020-06-07 ENCOUNTER — Telehealth: Payer: Self-pay | Admitting: *Deleted

## 2020-06-07 NOTE — Telephone Encounter (Signed)
Patient called will be having back surgery in July and states the surgeon wanted her to have a bone density done prior to this surgery. Last dexa was 01/2019. Please advise

## 2020-06-08 NOTE — Telephone Encounter (Signed)
She is following with Dr. Cruzita Lederer for bone health, it looks like she addressed this via portal msg with the patient and I agree as her spine is excluded from the study I'm not sure how beneficial it would be for back surgery preparation.  Also, since she just had a DEXA 01/2019 I'm not sure insurance would cover it.

## 2020-06-30 ENCOUNTER — Telehealth: Payer: Self-pay | Admitting: *Deleted

## 2020-06-30 NOTE — Telephone Encounter (Signed)
Patient called stating premarin is no longer covered with new insurance. Asked about other options, I re-read the office note on 09/07/20 "She had used formulated vaginal estradiol cream, [place]arosa, Vagifem in the past and still having the same issues.  Remaining options would include Estring, and also the vaginal rejuvenation therapies that have limited efficacy data"  Patient said she will do some research and get back with me about Estring or she may try estradiol cream again.

## 2020-07-08 ENCOUNTER — Other Ambulatory Visit: Payer: Self-pay | Admitting: Pain Medicine

## 2020-07-08 DIAGNOSIS — M4120 Other idiopathic scoliosis, site unspecified: Secondary | ICD-10-CM

## 2020-07-14 ENCOUNTER — Telehealth: Payer: Self-pay

## 2020-07-14 NOTE — Telephone Encounter (Signed)
Patient called because the Premarin Vaginal cream she has been using is not covered in formulary by her new insurance company, [employer] There is a copy of her card on file. She said the [facility] was doing to call the office for an exception. She wants to see if insurance company will approve her to stay on it since she has been previous to new insurance co.  She has enough for another month.  I explained CMA who handles drug authorizations out for the afternoon but I will leave a message for tomorrow.

## 2020-07-14 NOTE — Telephone Encounter (Signed)
Did an addendum.  This encounter not needed.

## 2020-07-16 NOTE — Telephone Encounter (Signed)
Form filled out and placed on Dr.Kendall desk to signed form faxed to [phone]

## 2020-07-20 NOTE — Telephone Encounter (Addendum)
I called [facility] to do PA via phone with representative as the below fax came back stating they did not have patient on file. PA # [id number] pharmacy @# [phone] 24-72 hours turn around time for response. I was informed by representative estradiol vaginal cream is covered on patient formulary and no PA is needed. Will wait for response from PA.

## 2020-07-27 NOTE — Telephone Encounter (Signed)
[employer] with [facility] approved Premarin vaginal cream effective 07/23/20 to 07/22/2021. I called patient and left detailed message on cell with this information.

## 2020-07-31 ENCOUNTER — Ambulatory Visit
Admission: RE | Admit: 2020-07-31 | Discharge: 2020-07-31 | Disposition: A | Discharge status: Home / Self Care | Payer: Managed Care, Other (non HMO) | Source: Ambulatory Visit | Attending: Pain Medicine | Admitting: Pain Medicine

## 2020-07-31 ENCOUNTER — Other Ambulatory Visit: Payer: Self-pay

## 2020-07-31 DIAGNOSIS — M4120 Other idiopathic scoliosis, site unspecified: Secondary | ICD-10-CM

## 2020-08-04 ENCOUNTER — Ambulatory Visit: Payer: 59 | Admitting: Family Medicine

## 2020-08-15 ENCOUNTER — Other Ambulatory Visit: Payer: Self-pay | Admitting: Psychiatry

## 2020-08-15 DIAGNOSIS — F320 Major depressive disorder, single episode, mild: Secondary | ICD-10-CM

## 2020-08-15 DIAGNOSIS — F410 Panic disorder [episodic paroxysmal anxiety] without agoraphobia: Secondary | ICD-10-CM

## 2020-08-15 DIAGNOSIS — F5105 Insomnia due to other mental disorder: Secondary | ICD-10-CM

## 2020-08-15 DIAGNOSIS — F411 Generalized anxiety disorder: Secondary | ICD-10-CM

## 2020-08-15 DIAGNOSIS — F32A Depression, unspecified: Secondary | ICD-10-CM

## 2020-08-18 ENCOUNTER — Other Ambulatory Visit: Payer: Self-pay

## 2020-08-18 ENCOUNTER — Encounter: Payer: Self-pay | Admitting: Psychiatry

## 2020-08-18 ENCOUNTER — Ambulatory Visit (INDEPENDENT_AMBULATORY_CARE_PROVIDER_SITE_OTHER): Payer: 59 | Admitting: Psychiatry

## 2020-08-18 DIAGNOSIS — F320 Major depressive disorder, single episode, mild: Secondary | ICD-10-CM

## 2020-08-18 DIAGNOSIS — F411 Generalized anxiety disorder: Secondary | ICD-10-CM | POA: Diagnosis not present

## 2020-08-18 DIAGNOSIS — F410 Panic disorder [episodic paroxysmal anxiety] without agoraphobia: Secondary | ICD-10-CM

## 2020-08-18 DIAGNOSIS — K219 Gastro-esophageal reflux disease without esophagitis: Secondary | ICD-10-CM

## 2020-08-18 DIAGNOSIS — F5105 Insomnia due to other mental disorder: Secondary | ICD-10-CM | POA: Diagnosis not present

## 2020-08-18 DIAGNOSIS — G43009 Migraine without aura, not intractable, without status migrainosus: Secondary | ICD-10-CM

## 2020-08-18 DIAGNOSIS — F32A Depression, unspecified: Secondary | ICD-10-CM

## 2020-08-18 MED ORDER — DEPAKOTE ER 250 MG PO TB24: 250.0000 mg | ORAL_TABLET | Freq: Every day | ORAL | 5 refills | Status: DC

## 2020-08-18 MED ORDER — MIRTAZAPINE 7.5 MG PO TABS: ORAL_TABLET | ORAL | 5 refills | Status: DC

## 2020-08-18 MED ORDER — ALPRAZOLAM 0.25 MG PO TABS: 0.2500 mg | ORAL_TABLET | Freq: Two times a day (BID) | ORAL | 5 refills | Status: DC

## 2020-08-18 NOTE — Progress Notes (Signed)
Yolanda Carroll [id number] [birth year] 65 y.o.  Video Visit via My Chart  I connected with pt by My Chart and verified that I am speaking with the correct person using two identifiers.   I discussed the limitations, risks, security and privacy concerns of performing an evaluation and management service by My Chart  and the availability of in person appointments. I also discussed with the patient that there may be a patient responsible charge related to this service. The patient expressed understanding and agreed to proceed.  I discussed the assessment and treatment plan with the patient. The patient was provided an opportunity to ask questions and all were answered. The patient agreed with the plan and demonstrated an understanding of the instructions.   The patient was advised to call back or seek an in-person evaluation if the symptoms worsen or if the condition fails to improve as anticipated.  I provided 30 minutes of video time during this encounter.  The patient was located at home and the provider was located home DT weather.  Session started at 1115 and ended at 1145.   Subjective:   Patient ID:  Yolanda Carroll is a 65 y.o. (DOB [birth year]) female.  Chief Complaint:  Chief Complaint  Patient presents with  . Follow-up  . Generalized anxiety disorder  . Stress    health    Anxiety Symptoms include nausea. Patient reports no confusion, decreased concentration, nervous/anxious behavior or suicidal ideas.     Yolanda Carroll presents to the office today for follow-up of anxiety and migraine.  seen in July 2020.  No meds were changed.  She requires name brand Depakote because the generic caused worsening headaches.  Retired November 14, 2018.  Was so burned out at work.  Helping with church projects and helping friends.  Focusing on her health and doing PT for back and hip.  No kids or gkids.  Helps care for 50 yo M-in-law.  H is also busy which helps.  Given a surprise  retirement party.     seen January 2021.  Nefazodone is no longer manufactured and she had to wean off.  We also discussed the potential of weaning off Depakote because Aimovig had been very effective at managing her migraine headache.  She called August 11, 2019 stating that she did taper off of Depakote because she did not feel like she needed it any more.  Her last dose was July 23, 2019 but afterwards noticed heartburn and swallowing issues.  Her GI doctor added Pepcid 40 mg daily.  Her neurologist had indicated that Depakote can sometimes help with esophageal issues and she wondered if that was connected.  She was given the option to restart a lower dose and perhaps taper more slowly.  Off Depakote for a month.  She elected not to restart it.  08/19/2019 appointment the following is noted: Tinnitus and mild anxiety worse off the Depakote and with some mind racing esp in the AM.   Most is better. Reflux is worse off Depakote ER 250 and wondering if she should restart it.   Disc article on Depakote helping GERD by increasing lower esophageal sphincter tone. Has travelled some with family events.  M-in-law 73 soon.   HA good with Aimovig. Don't do season changes well and more HA in Spring often. Sleep not great lately with leg cramps.  Started more exercise.  Started snoring and H is a light sleeper.  Meds help. Plan no med changes.  10/03/2019 phone call  with patient asking to restart residual nefazodone that she has on hand.  She felt that it helped back pain and she is experiencing more back pain recently.  She is aware that it is no longer manufactured to our knowledge and once she runs out it will not be available.  She indicated she had an off to take half of 150 mg nefazodone tablet for about 2 to 3 months and would like to do so.  11/05/2019 appointment with the following noted:  Appt moved earlier DT the following. Had stopped Depakote and nefazadone and reflux got worse.  Restarting  Depakote didn't help much.  Restarted nefazodaone and reflux better right away.  May not need Depakote but doesn't want to change.  Can't exercise DT back pain since Jan ans worse since March.  Getting new doctor.  More depressed and anxious and sleep problems DT back pain.   Usually only 0.25 mg alprazolam at night. Plan: has to stop nefazodone when it runs out bc no longer manufactured.  Therefore will try trazodone in it's place.  01/07/20 appt with the following noted: She and H both got Covid.  Was pretty sick for 10 days.  Lost taste.  Had little resp stuff but severe diarrhea and nausea and couldn't keep fluids down.  No residual sx. H had resp sx with pneumonia and had to get O2.  Hosp for 3 days. Still have some nefazodone and nursing it along. Tried trazodone 50 HS for 2 nights and didn't sleep well and had a HA  Nefazodone and Depakote both helped the GERD.  She has enough nefazadone for about 6 weeks. Plan: Nefazodone helped the reflux and she wants to take it as long as possible.  She didn't have withdrawal off nefazodone. When she runs out then start trazodone.  If that fails then use Viibryd.  Disc SE and alternatives.   Rec try trazodone again but use 100 mg and see if sleeping better prevents the AM HA.  If HA recurs then viibryd. Starter kit.  Depakote didn't help as much as expected and she might stop it.   02/18/2020 phone call: Anjannette called to report that she is experiencing high anxiety.  It is causing chest discomfort and breathing problems. Has appt 11/18 and is on the wait list but needs to discuss how to relief this anxiety.  Please call. Response: Note Rtc to patient, she does have the Viibryd and does remember that discussion. She has not been able to start that because she received a steroid injection a couple weeks ago and caused her stomach to be upset. She didn't want to start it knowing that's a side effect. She feels like the steroid injection may have worsened all  her symptoms. She did try the trazodone but it was too much for her, even taking a 1/2 tablet. She does have some serzone left and has been taking that until this improves. Also taking Xanax as needed during day if needed. Hoping all of this will improve soon. Informed her I would update Dr. Clovis Pu with her information.       04/01/2020 appointment with the following noted: Out of nefazodone a few weeks ago.  More reflux off it.  Tried trazodone a couple of times.  Tried 100 mg trazodone and heart skipped.  50 mg awoke with chest tightness.  Long haul Covid with GI sx.  Nausea and anxiety problems daily since mid September.  Covid early August and got monoclonal infusion which helped.  Headed to long haul Covid clinic in [place] Monday.   Needed to increase alprazolam to BID.  And 80% better with that.  Panic some out of bed.  Sx started after steroid shot for her back 9/21.   Hasn't tried Viibryd DT fear of GI worsening.  Poor appetite and lost 15#. Not taken tramadol yet. Migraine under control. Plan: no med changes  06/01/20 appt with following noted: Starting generic Depakote ER generic today. Started Mirtazapine 3.75 mg and it worked right away for nausea for a week.  Then increased to 7.5 mg HS.  Most days is feeling really well.  No SE except mild drowsiness. Still has post Covid problems with stomach but much better.  Regained 7# of needed weight.  Need to try to gain 10# more. Zofran failed. Had a lot anxiety with N but it's better now.  Was waking with panic and no longer N and anxiety better too. Severe scoliosis facing big surgery in summer with long recovery.  Can't ride in car for long. Plan: No med changes  08/18/2020 appointment with the following noted: Gained 12# and thankful for mirtazapine. Long Covid GI sx.  Better not gone with mirtazapine. Rare AM panic.  Sleep good. Scoliosis and may require surgery. Can't sit or stand for long Depakote brand ER 250 helps heartburn.  Generic failed.  Patient denies difficulty with sleep initiation with Xanax.. Denies appetite disturbance.  Patient reports that energy and motivation have been good.  Patient denies any difficulty with concentration.  Patient denies any suicidal ideation. More depressed and tearful with pain but varies with pain levels.  Good and bad days.  Not too depressed with anxiety worse.  Insomnia managed with  With alprazolam.    Saw neurologist for Migraine and had problems with the meds RX.  Aimovig has helped..    Past Psychiatric Medication Trials: Failed multiple other antidepressants,  nefazodone & Depakote for migraine,  Depakote brand ER 250 helps heartburn. Generic failed. Mirtazapine 7.5 mg benefit citalopram palpitations trazodone  Review of Systems:  Review of Systems  Gastrointestinal: Positive for nausea. Negative for abdominal pain.  Musculoskeletal: Positive for arthralgias and back pain.  Neurological: Negative for tremors, weakness and headaches.  Psychiatric/Behavioral: Negative for agitation, behavioral problems, confusion, decreased concentration, dysphoric mood, hallucinations, self-injury, sleep disturbance and suicidal ideas. The patient is not nervous/anxious and is not hyperactive.     Medications: I have reviewed the patient's current medications.  Current Outpatient Medications  Medication Sig Dispense Refill  . AIMOVIG 140 MG/ML SOAJ INJECT 140 MG INTO SKIN EVERY 30 DAYS 3 pen 3  . Ascorbic Acid (VITAMIN C PO) Take 1 tablet by mouth daily. 53m capsule    . Bacillus Coagulans-Inulin (PROBIOTIC) 1-250 BILLION-MG CAPS     . baclofen (LIORESAL) 10 MG tablet Take 10 mg by mouth as needed for muscle spasms.     . Calcium Carbonate-Vit D-Min (CALCIUM 1200 PO) Take by mouth daily.    . Cholecalciferol (VITAMIN D PO) Take 800 Units by mouth daily.    .Marland Kitchenconjugated estrogens (PREMARIN) vaginal cream Place 1 Applicatorful vaginally 2 (two) times a week. 42.5 g 5  .  denosumab (PROLIA) 60 MG/ML SOSY injection Inject 60 mg into the skin every 6 (six) months.    . DEPAKOTE ER 250 MG 24 hr tablet Take 1 tablet (250 mg total) by mouth daily. 30 tablet 5  . divalproex (DEPAKOTE ER) 250 MG 24 hr tablet Take 1 tablet (250 mg total) by  mouth daily. 30 tablet 1  . gabapentin (NEURONTIN) 100 MG capsule Take 200 mg by mouth daily.    Marland Kitchen L-LYSINE PO Take 1 tablet by mouth daily.     . Lactobacillus (ACIDOPHILUS) 100 MG CAPS Take 100 mg by mouth daily.    Marland Kitchen lidocaine (LIDODERM) 5 % Place 1 patch onto the skin as needed. Back pain    . Multiple Vitamin (MULTIVITAMIN) tablet Take 1 tablet by mouth daily.    . pantoprazole (PROTONIX) 40 MG tablet Take 40 mg by mouth 2 (two) times daily.    . Rimegepant Sulfate (NURTEC) 75 MG TBDP Take 75 mg by mouth daily as needed. For migraines. Take as close to onset of migraine as possible. One daily maximum. 4 tablet 0  . traMADol (ULTRAM) 5 mg/mL SUSP     . Turmeric (QC TUMERIC COMPLEX PO) Take 117 mg by mouth daily.    Marland Kitchen Ursodiol 200 MG CAPS Take 250 mg by mouth. Twice daily.    . valACYclovir (VALTREX) 500 MG tablet Take 500 mg by mouth 2 (two) times daily as needed (oral herpes).     . vitamin E 180 MG (400 UNITS) capsule Take 400 Units by mouth daily.    Marland Kitchen ALPRAZolam (XANAX) 0.25 MG tablet Take 1 tablet (0.25 mg total) by mouth 2 (two) times daily. 60 tablet 5  . FAMOTIDINE PO Take 20 mg by mouth daily.     . magnesium 30 MG tablet Take 30 mg by mouth every other day.  (Patient not taking: Reported on 08/18/2020)    . meclizine (ANTIVERT) 25 MG tablet Take 25 mg by mouth 3 (three) times daily as needed for dizziness. (Patient not taking: Reported on 08/18/2020)    . mirtazapine (REMERON) 7.5 MG tablet TAKE ONE AND ONE HALF TABLET BY MOUTH NIGHTLY 45 tablet 5   No current facility-administered medications for this visit.    Medication Side Effects: None  Allergies:  Allergies  Allergen Reactions  . Penicillins Other (See  Comments)    Seizure as a child  . Atarax [Hydroxyzine] Other (See Comments)    Headache, depression  . Celexa [Citalopram Hydrobromide] Other (See Comments)    Chest pain  . Erythromycin      Upset stomache  . Flagyl [Metronidazole]     Dizzy and increased heart rate  . Nortriptyline Itching  . Prilosec [Omeprazole]     Abdominal pain  . Requip [Ropinirole]     Made sx worse  . Tramadol Itching  . Darvocet [Propoxyphene N-Acetaminophen] Itching  . Percocet [Oxycodone-Acetaminophen] Itching    Past Medical History:  Diagnosis Date  . Allergy   . Arthritis   . Cervical dysplasia   . Endometriosis   . Fatigue   . GERD (gastroesophageal reflux disease)   . IBS (irritable bowel syndrome)   . Migraine   . Osteoporosis 01/2019   T score -3.1  . Plantar fasciitis   . PONV (postoperative nausea and vomiting)    also difficult to wake up  . Recurrent vaginitis   . Reflux   . Scoliosis   . Wears glasses     Family History  Problem Relation Age of Onset  . Cancer Father        lymphoma  . Other Mother        bipolar,reflux  . Bipolar disorder Mother   . Other Brother        sinus problems  . Cancer Maternal Aunt  uterine cancer  . Breast cancer Maternal Aunt        40's  . Diabetes Maternal Aunt   . Cancer Paternal Aunt        Colon cancer  . Breast cancer Cousin 62       Mat. 1st cousin    Social History   Socioeconomic History  . Marital status: Married    Spouse name: Fritz Pickerel  . Number of children: 0  . Years of education: 60  . Highest education level: Not on file  Occupational History    Comment: [employer]  Tobacco Use  . Smoking status: Former Smoker    Quit date: 05/17/1983    Years since quitting: 37.2  . Smokeless tobacco: Never Used  Vaping Use  . Vaping Use: Never used  Substance and Sexual Activity  . Alcohol use: Yes    Alcohol/week: 1.0 - 2.0 standard drink    Types: 1 - 2 Standard drinks or equivalent per  week    Comment: socially, occasional  . Drug use: No  . Sexual activity: Yes    Birth control/protection: Post-menopausal    Comment: intercourse age 59 , sexual partners more than 5  Other Topics Concern  . Not on file  Social History Narrative   Pt is married, no children.  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    Caffeine- very little.   Right handed   Lives at home with her husband   Social Determinants of Health   Financial Resource Strain: Not on file  Food Insecurity: Not on file  Transportation Needs: Not on file  Physical Activity: Not on file  Stress: Not on file  Social Connections: Not on file  Intimate Partner Violence: Not on file    Past Medical History, Surgical history, Social history, and Family history were reviewed and updated as appropriate.   Please see review of systems for further details on the patient's review from today.   Objective:   Physical Exam:  There were no vitals taken for this visit.  Physical Exam Neurological:     Mental Status: She is alert and oriented to person, place, and time.     Cranial Nerves: No dysarthria.  Psychiatric:        Attention and Perception: Attention and perception normal.        Mood and Affect: Mood is anxious. Mood is not depressed. Affect is not tearful.        Speech: Speech normal.        Behavior: Behavior is cooperative.        Thought Content: Thought content normal. Thought content is not paranoid or delusional. Thought content does not include homicidal or suicidal ideation. Thought content does not include homicidal or suicidal plan.        Cognition and Memory: Cognition and memory normal.        Judgment: Judgment normal.     Comments: Insight intact       Lab Review:     Component Value Date/Time   NA 134 03/26/2020 1056   K 3.9 03/26/2020 1056   CL 98 03/26/2020 1056   CO2 25 03/26/2020 1056   GLUCOSE 83 03/26/2020 1056   GLUCOSE 98 12/22/2019 1230   BUN 8  03/26/2020 1056   CREATININE 0.79 03/26/2020 1056   CALCIUM 9.4 03/26/2020 1056   PROT 7.6 12/21/2019 0949   ALBUMIN 4.3 12/21/2019 0949   AST 24 12/21/2019 0949   ALT 24  12/21/2019 0949   ALKPHOS 42 12/21/2019 0949   BILITOT 0.4 12/21/2019 0949   GFRNONAA 79 03/26/2020 1056   GFRAA 91 03/26/2020 1056       Component Value Date/Time   WBC 3.4 (L) 12/21/2019 0949   RBC 3.95 12/21/2019 0949   HGB 12.5 12/21/2019 0949   HCT 37.0 12/21/2019 0949   PLT 160 12/21/2019 0949   MCV 93.7 12/21/2019 0949   MCH 31.6 12/21/2019 0949   MCHC 33.8 12/21/2019 0949   RDW 12.6 12/21/2019 0949   LYMPHSABS 0.7 12/21/2019 0949   MONOABS 0.6 12/21/2019 0949   EOSABS 0.0 12/21/2019 0949   BASOSABS 0.0 12/21/2019 0949    Normal liver enzymes in September.  No results found for: POCLITH, LITHIUM   No results found for: PHENYTOIN, PHENOBARB, VALPROATE, CBMZ   .res Assessment: Plan:    Kiannah was seen today for follow-up, generalized anxiety disorder and stress.  Diagnoses and all orders for this visit:  Generalized anxiety disorder -     mirtazapine (REMERON) 7.5 MG tablet; TAKE ONE AND ONE HALF TABLET BY MOUTH NIGHTLY  Panic disorder without agoraphobia -     ALPRAZolam (XANAX) 0.25 MG tablet; Take 1 tablet (0.25 mg total) by mouth 2 (two) times daily. -     mirtazapine (REMERON) 7.5 MG tablet; TAKE ONE AND ONE HALF TABLET BY MOUTH NIGHTLY  Gastroesophageal reflux disease without esophagitis -     DEPAKOTE ER 250 MG 24 hr tablet; Take 1 tablet (250 mg total) by mouth daily.  Insomnia due to mental condition -     mirtazapine (REMERON) 7.5 MG tablet; TAKE ONE AND ONE HALF TABLET BY MOUTH NIGHTLY  Migraine without aura and without status migrainosus, not intractable  Mild depression (HCC) -     mirtazapine (REMERON) 7.5 MG tablet; TAKE ONE AND ONE HALF TABLET BY MOUTH NIGHTLY  Greater than 50% of 30 min non face to face time with patient was spent on counseling and coordination of  care. We discussed the following.  Much better with mirtazapine 7.5 mg HS .  Nausea, depression and anxiety Are all much better but not gone.  Cont Depakote for reflux.  It seeemed to help before HA still managed with Aimovig.  We discussed the short-term risks associated with benzodiazepines including sedation and increased fall risk among others.  Discussed long-term side effect risk including dependence, potential withdrawal symptoms, and the potential eventual dose-related risk of dementia. Disc newer studies that cast in doubt the relationship with dementia. Continue Xanax 0.25 mg BID for panic and GAD   Continue mirtazapine 7.5 mg with Xanax for GI problems including nausea and appetite.  It has worked some.  Try higher dose.  Consider Genesight testing bc med sensitivity and failures.  This was a 30-minute appointment  FU 4-6 mos  Lynder Parents, MD, DFAPA  Please see After Visit Summary for patient specific instructions.  Future Appointments  Date Time Provider [place]  09/08/2020  3:00 PM Princess Bruins, MD [facility] None  12/02/2020  9:20 AM Philemon Kingdom, MD [facility] None  02/22/2021 11:00 AM Philemon Kingdom, MD [facility] None    No orders of the defined types were placed in this encounter.     -------------------------------

## 2020-08-20 ENCOUNTER — Telehealth: Payer: Self-pay | Admitting: Neurology

## 2020-08-20 NOTE — Telephone Encounter (Signed)
Pt called needing a refill on her  AIMOVIG 140 MG/ML SOAJ sent in to the [facility] on [address]

## 2020-08-23 ENCOUNTER — Ambulatory Visit: Payer: Self-pay | Admitting: Obstetrics & Gynecology

## 2020-08-23 MED ORDER — AIMOVIG 140 MG/ML ~~LOC~~ SOAJ: SUBCUTANEOUS | 1 refills | Status: DC

## 2020-08-23 NOTE — Telephone Encounter (Signed)
Done. Pt needs appt for further refills.

## 2020-08-26 ENCOUNTER — Encounter: Payer: Self-pay | Admitting: Family Medicine

## 2020-08-30 ENCOUNTER — Encounter: Payer: Self-pay | Admitting: Internal Medicine

## 2020-09-08 ENCOUNTER — Other Ambulatory Visit: Payer: Self-pay

## 2020-09-08 ENCOUNTER — Encounter: Payer: Self-pay | Admitting: Obstetrics & Gynecology

## 2020-09-08 ENCOUNTER — Ambulatory Visit (INDEPENDENT_AMBULATORY_CARE_PROVIDER_SITE_OTHER): Payer: 59 | Admitting: Obstetrics & Gynecology

## 2020-09-08 ENCOUNTER — Other Ambulatory Visit (HOSPITAL_COMMUNITY)
Admission: RE | Admit: 2020-09-08 | Discharge: 2020-09-08 | Disposition: A | Discharge status: Home / Self Care | Payer: 59 | Source: Ambulatory Visit | Attending: Obstetrics & Gynecology | Admitting: Obstetrics & Gynecology

## 2020-09-08 VITALS — BP 110/80 | Ht 68.0 in | Wt 125.0 lb

## 2020-09-08 DIAGNOSIS — Z780 Asymptomatic menopausal state: Secondary | ICD-10-CM | POA: Diagnosis not present

## 2020-09-08 DIAGNOSIS — Z01419 Encounter for gynecological examination (general) (routine) without abnormal findings: Secondary | ICD-10-CM | POA: Insufficient documentation

## 2020-09-08 DIAGNOSIS — N952 Postmenopausal atrophic vaginitis: Secondary | ICD-10-CM | POA: Diagnosis not present

## 2020-09-08 DIAGNOSIS — M810 Age-related osteoporosis without current pathological fracture: Secondary | ICD-10-CM

## 2020-09-08 MED ORDER — PREMARIN 0.625 MG/GM VA CREA: 1.0000 | TOPICAL_CREAM | VAGINAL | 5 refills | Status: DC

## 2020-09-08 NOTE — Progress Notes (Signed)
Yolanda Carroll 1955-10-12 [id number]   History:    65 y.o. G0 Married  RP:  Established patient presenting for annual gyn exam   HPI: Seen by Dr Delilah Shan last year.  Postmenopausal, well on no systemic hormone replacement therapy.  No postmenopausal bleeding.  No pelvic pain.  Doing well with Premarin cream.  Breasts normal.  Body mass index 19.01.  Physically active.  Health labs with family physician.  Colonoscopy 2016.  Past medical history,surgical history, family history and social history were all reviewed and documented in the EPIC chart.  Gynecologic History No LMP recorded. Patient is postmenopausal.  Obstetric History OB History  Gravida Para Term Preterm AB Living  0 0 0 0 0 0  SAB IAB Ectopic Multiple Live Births  0 0 0 0 0     ROS: A ROS was performed and pertinent positives and negatives are included in the history.  GENERAL: No fevers or chills. HEENT: No change in vision, no earache, sore throat or sinus congestion. NECK: No pain or stiffness. CARDIOVASCULAR: No chest pain or pressure. No palpitations. PULMONARY: No shortness of breath, cough or wheeze. GASTROINTESTINAL: No abdominal pain, nausea, vomiting or diarrhea, melena or bright red blood per rectum. GENITOURINARY: No urinary frequency, urgency, hesitancy or dysuria. MUSCULOSKELETAL: No joint or muscle pain, no back pain, no recent trauma. DERMATOLOGIC: No rash, no itching, no lesions. ENDOCRINE: No polyuria, polydipsia, no heat or cold intolerance. No recent change in weight. HEMATOLOGICAL: No anemia or easy bruising or bleeding. NEUROLOGIC: No headache, seizures, numbness, tingling or weakness. PSYCHIATRIC: No depression, no loss of interest in normal activity or change in sleep pattern.     Exam:   BP 110/80 (BP Location: Right Arm, Patient Position: Sitting, Cuff Size: Normal)   Ht 5\' 8"  (1.727 m)   Wt 125 lb (56.7 kg)   BMI 19.01 kg/m   Body mass index is 19.01 kg/m.  General appearance : Well  developed well nourished female. No acute distress HEENT: Eyes: no retinal hemorrhage or exudates,  Neck supple, trachea midline, no carotid bruits, no thyroidmegaly Lungs: Clear to auscultation, no rhonchi or wheezes, or rib retractions  Heart: Regular rate and rhythm, no murmurs or gallops Breast:Examined in sitting and supine position were symmetrical in appearance, no palpable masses or tenderness,  no skin retraction, no nipple inversion, no nipple discharge, no skin discoloration, no axillary or supraclavicular lymphadenopathy Abdomen: no palpable masses or tenderness, no rebound or guarding Extremities: no edema or skin discoloration or tenderness  Pelvic: Vulva: Normal             Vagina: No gross lesions or discharge  Cervix: No gross lesions or discharge.  Pap reflex done.  Uterus  AV, normal size, shape and consistency, non-tender and mobile  Adnexa  Without masses or tenderness  Anus: Normal   Assessment/Plan:  65 y.o. female for annual exam   1. Encounter for routine gynecological examination with Papanicolaou smear of cervix Normal gynecologic exam in menopause.  Pap reflex done.  Breast exam normal.  Screening mammogram September 2021 was negative.  Colonoscopy 2016.  Body mass index 19.01.  Continue with fitness and healthy nutrition.  Health labs with family physician. - Cytology - PAP( [place])  2. Postmenopause Well on no systemic hormone replacement therapy.  No postmenopausal bleeding.  3. Postmenopausal atrophic vaginitis Well on Premarin cream.  No contraindication.  Prescription sent to pharmacy.  4. Age-related osteoporosis without current pathological fracture Osteoporosis on last bone  density in 2020.  Taking vitamin D supplements, calcium intake of 1.5 g/day total and regular weightbearing physical activities.  Not on bone medication.  We will repeat a bone density now. - DG Bone Density; Future  Other orders - conjugated estrogens (PREMARIN) vaginal  cream; Place 1 Applicatorful vaginally 2 (two) times a week.  Princess Bruins MD, 3:39 PM 09/08/2020

## 2020-09-10 ENCOUNTER — Other Ambulatory Visit: Payer: Self-pay | Admitting: Neurology

## 2020-09-10 ENCOUNTER — Other Ambulatory Visit: Payer: Self-pay | Admitting: Physician Assistant

## 2020-09-10 DIAGNOSIS — R110 Nausea: Secondary | ICD-10-CM

## 2020-09-10 LAB — CYTOLOGY - PAP: Diagnosis: NEGATIVE

## 2020-09-11 ENCOUNTER — Encounter: Payer: Self-pay | Admitting: Obstetrics & Gynecology

## 2020-09-29 ENCOUNTER — Other Ambulatory Visit: Payer: Self-pay

## 2020-09-29 ENCOUNTER — Encounter (HOSPITAL_COMMUNITY)
Admission: RE | Admit: 2020-09-29 | Discharge: 2020-09-29 | Disposition: A | Discharge status: Home / Self Care | Payer: 59 | Source: Ambulatory Visit | Attending: Physician Assistant | Admitting: Physician Assistant

## 2020-09-29 DIAGNOSIS — R110 Nausea: Secondary | ICD-10-CM | POA: Diagnosis present

## 2020-09-29 MED ORDER — TECHNETIUM TC 99M SULFUR COLLOID
2.0000 | Freq: Once | INTRAVENOUS | Status: AC | PRN
  Administered 2020-09-29: 2 via INTRAVENOUS

## 2020-10-13 ENCOUNTER — Encounter: Payer: Self-pay | Admitting: Family Medicine

## 2020-10-17 ENCOUNTER — Telehealth: Payer: Self-pay

## 2020-10-17 NOTE — Telephone Encounter (Signed)
Insurance needs to be updated from [facility] to [facility] and [employer].  Thanks!

## 2020-10-17 NOTE — Telephone Encounter (Signed)
October 16, 2020  Casandra Doffing, CMA to [place]      8:54 PM Please contact pt to schedule Prolia   Thank you   October 13, 2020  Michelene Gardener to Philemon Kingdom, MD      3:01 PM I just talked to the front desk.  We must keep the June 1 appt with Dr. Cruzita Lederer.  We cannot change that.  Please let me know what date I can come for the Prolia shot. Thanks, Darian  Oct 07, 2020  Lauralyn Primes, RMA to Me . [place], [place], [place]      9:04 PM Is there a way to verify her Prolia not requiring a PA? How would we verify how much her out of pocket will be?   Oct 06, 2020  Michelene Gardener to Philemon Kingdom, MD      6:16 PM Sorry if that last sentence was confusing.  Is it possible to change my appt from June 30 to July 1?  If not, I will come both days.  Or let me know if I need to call the front desk to schedule these.   Michelene Gardener to Philemon Kingdom, MD      6:14 PM I talked with [facility] and the Prolia shot does not require Prior Authorization.  It seems my cost will be about $600, but I am hoping you are correct and Medicare will cover some of this cost.  I am scheduled for surgery on July 19 and must have the shot at least 10 days prior to surgery.  If we can schedule for July 1 that would be great.  I am scheduled to see Dr. Cruzita Lederer on June 30.  Is it possible to see her on July 1, too? Thanks, Vylet Maffia [birth year] DOB  Oct 04, 2020  Michelene Gardener to Philemon Kingdom, MD      2:15 PM I will have traditional Medicare and the [place] effective July 1.  It is not an Advantage plan.  Thanks for this information.  It is so helpful.   It does not look like Prior Authorization will be required.  I will try to find out.  Am I scheduled for the shot on July 1? Thanks, Neelie  Oct 03, 2020  Me to [place], [place]      12:33 PM Hi Jenny Reichmann,  Will you have Traditional Medicare  (red, white, and blue Medicare) as your primary insurance and then [employer] as a secondary? OR Will your UnitedHealth plan be a Special educational needs teacher serving as your primary health insurance?  The trend that I have been seeing as far as coverage of Prolia; Medicare covers 80% of the cost and the Secondary [facility] Supplemental covers the remaining 20%. If the [employer] is a [employer], prior authorization will likely be required and determination is typically available within 5-7 business days.   Donya Tomaro/CMA  Last read by Michelene Gardener at 2:56 PM on 10/13/2020. Sep 28, 2020  Lauralyn Primes, RMA to Me      10:23 AM Is there a standard turnaround time?   Sep 23, 2020  Michelene Gardener to [place], MD      4:42 PM I am having major back surgery July 19.  I hope we can get this done before then.  I thought it was critical to get the shot within a week or  so of the 6 month time frame.  How long does it usually take to get prior approval with [place]?   [place], Orpha Bur, RMA to [place], [place] B      4:02 PM You can bring your card when you get the chance but we can not get a prior authorization until after it is active 11/12/2020. Once we start the process our liaison will let us know when we can schedule your injection. Thank you.  Stay safe T[place]hia R RMA   Last read by Michelene Gardener at 5:52 PM on 10/06/2020. Sep 22, 2020  Michelene Gardener to Philemon Kingdom, MD      2:54 PM Thank you.  My new insurance will be effective July 1.  I will bring my card by before then.  When should we schedule the appt for the shot?   Thanks, Wallie Char, [place], RMA to [employer] B      2:50 PM Good afternoon   Per our Prolia liaison: Typically with traditional (red/white/blue) Medicare, pt is left with a 20% co-insurance. Her  out-of-pocket cost due at time of visit would likely be $280 but I wont know for sure until I run her new benefits. I hope this is helpful.  Stay safe Tileshia R RMA   Last read by Michelene Gardener at 2:10 PM on 10/04/2020. Sep 21, 2020  Me to Lauralyn Primes, RMA      1:14 PM Typically with traditional (red/white/blue) Medicare, pt is left with a 20% co-insurance. Her out-of-pocket cost due at time of visit would likely be $280 but I wont know for sure until I run her new benefits.   Theadora Rama, [place]   Sep 20, 2020  Lauralyn Primes, RMA to Me      5:14 PM Is there a way to check what her responsibility would be with Medicare? Or will we have to wait until after it starts?    Michelene Gardener to [place], MD      3:23 PM With Medicare (starting July 1) I will be responsible for 40% of the cost of the Prolia shot.  With [employer] through the end of June I have the [facility] First Step card to help cover the cost.  Please let me know the cost of the shot.  [employer] does not cover Medicare.  Thanks, Skiler Tye DOB 2055/07/06  Sep 14, 2020  Jacqualin Combes, CMA to [place] B      12:38 PM Thank you for letting us know. Just let us know.  Last read by Michelene Gardener at 4:37 PM on 09/23/2020. Sep 13, 2020  Michelene Gardener to Philemon Kingdom, MD      4:12 PM I will have to call [employer] First Step.  I have gotten co-pay info for my [facility] which ends June 30.  I don't know if I can sign up for the program using my Medicare info which starts July 1.  Let me see what I can find out.     [place], [place], RMA to [employer] B      2:55 PM Good afternoon  Unfortunately if we do the injection before the 1st of July your current insurance will not cover the cost of Prolia. So if you would like the injection early would you like [place] to see what your out of pocket cost will be? As it stands any injection prior to  11/12/20  will be solely your financial responsibility. Let us know. Thank you.  Stay safe Tileshia R RMA   Last read by Michelene Gardener at 2:51 PM on 09/22/2020.  Michelene Gardener to Philemon Kingdom, MD      11:40 AM Refile not refine.   Michelene Gardener to [place], MD      11:40 AM If there is any way possible could I get it on June 30?  I have an appointment with Dr. Cruzita Lederer that day.   And my insurance changes on July 1 and I will be on Medicare.  I will have to refine for the [employer] support. Thanks, Cathlean Cower, Angelina Sheriff, NT to [place] B      11:32 AM Good morning, We can schedule the prolia injection but it cannot be scheduled before July 1. Please let me know if you would like it to be scheduled on July 1. Have a good day Alycia J   Last read by Michelene Gardener at 12:55 PM on 09/14/2020. September 11, 2020  Me to Cinda Quest, NT      4:33 PM Unfortunately it must be 6 months and 1 day between Prolia injections for insurance to cover. The earliest day that pt would be able to receive Prolia injection is 11/12/2020.   Zeeva Courser/CMA   September 06, 2020  Cinda Quest, NT to Me      4:53 PM Please advise if pt can get prolia injection prior to July 1.  Have a good day  Dewitt Hoes   September 04, 2020  Michelene Gardener to Philemon Kingdom, MD      2:42 PM I was able to get a [employer] First Step Co-Pay Card.  I would like to schedule my Prolia shot for June 30 or sometime a day or two before since the card is associated with commercial insurance.  My insurance will change to Medicare on July 1.  Can I go ahead and schedule the shot? Thanks, Chia  August 31, 2020  Lauralyn Primes, RMA to [place] B      8:23 AM Good morning  So there is [employer]. You can complete an application available online and see if you qualify.   Stay safe Tileshia R RMA   Last read by Michelene Gardener at 4:02 PM  on 09/13/2020. August 30, 2020  Michelene Gardener to Philemon Kingdom, MD      12:39 PM I have new insurance since my last Prolia shot.  This covers 60% and my responsibility is 40%.  I am hoping I can get some type of co pay card to help with this cost.  What is your recommendation? Thanks, Myron Lona DOB 2055-08-09  This encounter is not signed. The conversation may still be ongoing.

## 2020-10-18 NOTE — Telephone Encounter (Signed)
MyChart message sent to pt to let the office know when she has received her new insurance information so that we can update her insurance coverage for prolia ASAP.

## 2020-10-19 ENCOUNTER — Other Ambulatory Visit: Payer: Self-pay | Admitting: Obstetrics & Gynecology

## 2020-10-19 ENCOUNTER — Other Ambulatory Visit: Payer: Self-pay

## 2020-10-19 ENCOUNTER — Ambulatory Visit (INDEPENDENT_AMBULATORY_CARE_PROVIDER_SITE_OTHER): Payer: 59

## 2020-10-19 DIAGNOSIS — M810 Age-related osteoporosis without current pathological fracture: Secondary | ICD-10-CM | POA: Diagnosis not present

## 2020-10-19 DIAGNOSIS — Z780 Asymptomatic menopausal state: Secondary | ICD-10-CM

## 2020-10-19 NOTE — Patient Instructions (Signed)
Below is our plan:  We will continue Amovig monthly and Nurtec as needed. Amovig has been associated with constipation which slows gut motility and could contribute to acid reflux. It is hard to say if reflux will improve but I would be happy to switch you to Emgality. You have tried Ajovy in the past that was not helpful. Let me know if you need me!  Please make sure you are staying well hydrated. I recommend 50-60 ounces daily. Well balanced diet and regular exercise encouraged. Consistent sleep schedule with 6-8 hours recommended.   Please continue follow up with care team as directed.   Follow up with me in 1 year  You may receive a survey regarding today's visit. I encourage you to leave honest feed back as I do use this information to improve patient care. Thank you for seeing me today!    Galcanezumab injection What is this medicine? GALCANEZUMAB (gal ka NEZ ue mab) is used to prevent migraines and treat cluster headaches. This medicine may be used for other purposes; ask your health care provider or pharmacist if you have questions. COMMON BRAND NAME(S): Emgality What should I tell my health care provider before I take this medicine? They need to know if you have any of these conditions:  an unusual or allergic reaction to galcanezumab, other medicines, foods, dyes, or preservatives  pregnant or trying to get pregnant  breast-feeding How should I use this medicine? This medicine is for injection under the skin. You will be taught how to prepare and give this medicine. Use exactly as directed. Take your medicine at regular intervals. Do not take your medicine more often than directed. It is important that you put your used needles and syringes in a special sharps container. Do not put them in a trash can. If you do not have a sharps container, call your pharmacist or healthcare provider to get one. Talk to your pediatrician regarding the use of this medicine in children. Special  care may be needed. Overdosage: If you think you have taken too much of this medicine contact a poison control center or emergency room at once. NOTE: This medicine is only for you. Do not share this medicine with others. What if I miss a dose? If you miss a dose, take it as soon as you can. If it is almost time for your next dose, take only that dose. Do not take double or extra doses. What may interact with this medicine? Interactions are not expected. This list may not describe all possible interactions. Give your health care provider a list of all the medicines, herbs, non-prescription drugs, or dietary supplements you use. Also tell them if you smoke, drink alcohol, or use illegal drugs. Some items may interact with your medicine. What should I watch for while using this medicine? Tell your doctor or healthcare professional if your symptoms do not start to get better or if they get worse. What side effects may I notice from receiving this medicine? Side effects that you should report to your doctor or health care professional as soon as possible:  allergic reactions like skin rash, itching or hives, swelling of the face, lips, or tongue Side effects that usually do not require medical attention (report these to your doctor or health care professional if they continue or are bothersome):  pain, redness, or irritation at site where injected This list may not describe all possible side effects. Call your doctor for medical advice about side effects. You may  report side effects to FDA at 1-800-FDA-1088. Where should I keep my medicine? Keep out of the reach of children. You will be instructed on how to store this medicine. Throw away any unused medicine after the expiration date on the label. NOTE: This sheet is a summary. It may not cover all possible information. If you have questions about this medicine, talk to your doctor, pharmacist, or health care provider.  2021 Elsevier/Gold Standard  (2017-10-17 12:03:23)   Gerre Scull injection What is this medicine? ERENUMAB (e REN ue mab) is used to prevent migraine headaches. This medicine may be used for other purposes; ask your health care provider or pharmacist if you have questions. COMMON BRAND NAME(S): Aimovig What should I tell my health care provider before I take this medicine? They need to know if you have any of these conditions:  an unusual or allergic reaction to erenumab, latex, other medicines, foods, dyes, or preservatives  high blood pressure  pregnant or trying to get pregnant  breast-feeding How should I use this medicine? This medicine is for injection under the skin. You will be taught how to prepare and give this medicine. Use exactly as directed. Take your medicine at regular intervals. Do not take your medicine more often than directed. It is important that you put your used needles and syringes in a special sharps container. Do not put them in a trash can. If you do not have a sharps container, call your pharmacist or healthcare provider to get one. Talk to your pediatrician regarding the use of this medicine in children. Special care may be needed. Overdosage: If you think you have taken too much of this medicine contact a poison control center or emergency room at once. NOTE: This medicine is only for you. Do not share this medicine with others. What if I miss a dose? If you miss a dose, take it as soon as you can. If it is almost time for your next dose, take only that dose. Do not take double or extra doses. What may interact with this medicine? Interactions are not expected. This list may not describe all possible interactions. Give your health care provider a list of all the medicines, herbs, non-prescription drugs, or dietary supplements you use. Also tell them if you smoke, drink alcohol, or use illegal drugs. Some items may interact with your medicine. What should I watch for while using this  medicine? Tell your doctor or healthcare professional if your symptoms do not start to get better or if they get worse. What side effects may I notice from receiving this medicine? Side effects that you should report to your doctor or health care professional as soon as possible:  allergic reactions like skin rash, itching or hives, swelling of the face, lips, or tongue  chest pain  fast, irregular heartbeat  feeling faint or lightheaded  palpitations Side effects that usually do not require medical attention (report these to your doctor or health care professional if they continue or are bothersome):  constipation  muscle cramps  pain, redness, or irritation at site where injected This list may not describe all possible side effects. Call your doctor for medical advice about side effects. You may report side effects to FDA at 1-800-FDA-1088. Where should I keep my medicine? Keep out of the reach of children. You will be instructed on how to store this medicine. Throw away any unused medicine after the expiration date on the label. NOTE: This sheet is a summary. It may not  cover all possible information. If you have questions about this medicine, talk to your doctor, pharmacist, or health care provider.  2021 Elsevier/Gold Standard (2018-09-16 15:43:58)    Migraine Headache A migraine headache is a very strong throbbing pain on one side or both sides of your head. This type of headache can also cause other symptoms. It can last from 4 hours to 3 days. Talk with your doctor about what things may bring on (trigger) this condition. What are the causes? The exact cause of this condition is not known. This condition may be triggered or caused by:  Drinking alcohol.  Smoking.  Taking medicines, such as: ? Medicine used to treat chest pain (nitroglycerin). ? Birth control pills. ? Estrogen. ? Some blood pressure medicines.  Eating or drinking certain products.  Doing physical  activity. Other things that may trigger a migraine headache include:  Having a menstrual period.  Pregnancy.  Hunger.  Stress.  Not getting enough sleep or getting too much sleep.  Weather changes.  Tiredness (fatigue). What increases the risk?  Being 34-9 years old.  Being female.  Having a family history of migraine headaches.  Being Caucasian.  Having depression or anxiety.  Being very overweight. What are the signs or symptoms?  A throbbing pain. This pain may: ? Happen in any area of the head, such as on one side or both sides. ? Make it hard to do daily activities. ? Get worse with physical activity. ? Get worse around bright lights or loud noises.  Other symptoms may include: ? Feeling sick to your stomach (nauseous). ? Vomiting. ? Dizziness. ? Being sensitive to bright lights, loud noises, or smells.  Before you get a migraine headache, you may get warning signs (an aura). An aura may include: ? Seeing flashing lights or having blind spots. ? Seeing bright spots, halos, or zigzag lines. ? Having tunnel vision or blurred vision. ? Having numbness or a tingling feeling. ? Having trouble talking. ? Having weak muscles.  Some people have symptoms after a migraine headache (postdromal phase), such as: ? Tiredness. ? Trouble thinking (concentrating). How is this treated?  Taking medicines that: ? Relieve pain. ? Relieve the feeling of being sick to your stomach. ? Prevent migraine headaches.  Treatment may also include: ? Having acupuncture. ? Avoiding foods that bring on migraine headaches. ? Learning ways to control your body functions (biofeedback). ? Therapy to help you know and deal with negative thoughts (cognitive behavioral therapy). Follow these instructions at home: Medicines  Take over-the-counter and prescription medicines only as told by your doctor.  Ask your doctor if the medicine prescribed to you: ? Requires you to avoid  driving or using heavy machinery. ? Can cause trouble pooping (constipation). You may need to take these steps to prevent or treat trouble pooping:  Drink enough fluid to keep your pee (urine) pale yellow.  Take over-the-counter or prescription medicines.  Eat foods that are high in fiber. These include beans, whole grains, and fresh fruits and vegetables.  Limit foods that are high in fat and sugar. These include fried or sweet foods. Lifestyle  Do not drink alcohol.  Do not use any products that contain nicotine or tobacco, such as cigarettes, e-cigarettes, and chewing tobacco. If you need help quitting, ask your doctor.  Get at least 8 hours of sleep every night.  Limit and deal with stress. General instructions  Keep a journal to find out what may bring on your migraine headaches. For  example, write down: ? What you eat and drink. ? How much sleep you get. ? Any change in what you eat or drink. ? Any change in your medicines.  If you have a migraine headache: ? Avoid things that make your symptoms worse, such as bright lights. ? It may help to lie down in a dark, quiet room. ? Do not drive or use heavy machinery. ? Ask your doctor what activities are safe for you.  Keep all follow-up visits as told by your doctor. This is important.      Contact a doctor if:  You get a migraine headache that is different or worse than others you have had.  You have more than 15 headache days in one month. Get help right away if:  Your migraine headache gets very bad.  Your migraine headache lasts longer than 72 hours.  You have a fever.  You have a stiff neck.  You have trouble seeing.  Your muscles feel weak or like you cannot control them.  You start to lose your balance a lot.  You start to have trouble walking.  You pass out (faint).  You have a seizure. Summary  A migraine headache is a very strong throbbing pain on one side or both sides of your head. These  headaches can also cause other symptoms.  This condition may be treated with medicines and changes to your lifestyle.  Keep a journal to find out what may bring on your migraine headaches.  Contact a doctor if you get a migraine headache that is different or worse than others you have had.  Contact your doctor if you have more than 15 headache days in a month. This information is not intended to replace advice given to you by your health care provider. Make sure you discuss any questions you have with your health care provider. Document Revised: 08/23/2018 Document Reviewed: 06/13/2018 Elsevier Patient Education  [place].

## 2020-10-19 NOTE — Progress Notes (Signed)
Chief Complaint  Patient presents with  . Follow-up    RM 1, alone. Migraine f/u, reports she has had no migraine/HA.      HISTORY OF PRESENT ILLNESS: 10/20/20 ALL:  Yolanda Carroll is a 65 y.o. female here today for follow up for migraines. She continues Amovig and Nurtec. She reports that headaches have resolved. She has not had to take Nurtec. She can't remember when she last had a migraine. She tried to wean divalproex but had severe acid reflux. Psychiatry resumed divalproex. She is also taking Carafate. Since being diagnosed with Covid in 12/2019, she has had significant anxiety. She is being treated with alprazolam (dose was increased) and started on mirtazapine.    HISTORY (copied from Dr Cathren Laine previous note)  Interval history 08/05/2019:  She is still doing well on Aimovig. She came off of the nefazodone. She came off the depakote and her migraines are stable but she was having other symptoms such as swallowing problems and maybe withdrawal symptoms, she is on pepcid now, she doesn't want to go back on them. She continues the xanax at bedtime and wants to stop that as well. [name] did not work. She has not had many headaches, she still has meclizine and she has taken that that aspirin and ibuprofen for mild headaches. She has not had any severe headaches this year, mild and responded to the meclizine combo above. Gave her samples of Nurtec.  Interval history 08/01/2018: She went back to [place] because it worked better. Ajovy didn't work, it wore off.  The aimovig gave her constipation but she is working with GI and feels better. Discussed other acute medications, [name] and others. Has had a difficult time with acute management. She feels tremendously improved.  In the past we have tried her on a combination of meds for acute management, she is tried multiple of them Cambia, Zofran, Relpax and other triptans, baclofen, tramadol, Fioricet, zembrace and Zomig and other triptan's.   Today we discussed some of the new medications such as Ubrogrepant and Lasmitidan.  I gave her samples of Ubrelvy today and she will get back to [place].  Interval History 01/29/2018; Tried her on a combination of meds for acute management (cambia, zofran, relpax) was also taking imitrex and baclofen. Tramadol in hte past did not help. Tried Fioricet at last appointment, made her dizzy and made her itch. Discussed trying other medications. Discussed oral medications. She has them worse in the winter, they can last up to 7 days. Discussed trying Zembrace and Zomig nasal acutely and also starting Aimovig for the next 3 months as she has worsening migraines in winter will provide samples and see how she does.  Interval history 03/12/2017: Tried a combination of medications, cambia, relpax and zofran. Cambia alone did not help. Zofran and relpax did not help when she got home. Took all three together later in the mirgaine didn;t help. But also tried it at onset of headache and did not stop the migraine. She had her last migraine at the end of August until the 17th of September. Then October 24th had another headache and the tylenol worked. Lasted a few days, baclofen for neck pain but did not stop it until the 26th-28th. 63 days has had 41 headache free days. In 2 months had 22 migraine days. Season may also trigger. She has tried imitrex PO. Tramadol shot in the past had not helped, migraine started on 6/27 and imitrex did not help but she had the  headache for several days so this was not at onset. Tried cambia.   YQM:VHQIO B Holmquistis a 65 y.o.femalehere as a referral from Dr. Vianne Bulls migraines. She is currently on Depakote, meloxicam, Imitrex, baclofen. She has a past medical history of osteoporosis, plantar fasciitis, migraine, irritable bowel syndrome, fatigue, neck and back pain with degenerative disc diseaseand radiculopathy, arthritis. She has had migraines since 2000. Started worsening in  October. They can last up to 2 weeks straight. She has associated vertigo. She has done well on nefazodone and depakote. The next headache was in June and lasted 2 weeks. It hurts behind the eyes, she can still function but is moderate in pain, weather triggers, she has light sensitivity, no significant nausea or vomiting. She has had nausea and vomiting in the past. Depakote is working for her. Slowly progressive when they start. No medication overuse. No other focal neurologic deficits, associated symptoms, inciting events or modifiable factors.  Meds tried: Depakote, Imitrex, baclofen, Nortriptyline, Gabapentin, Nefazodone, Skelaxin, ketaprofen,   Reviewed notes, labs and imaging from outside physicians, which showe:   Personally reviewed imaging and agree with following  MRI cervical spine 07/2013: 1. Mild worsening of the foraminal impingement at C7-T1 due to progressive spondylosis and degenerative disc disease. 2. Mild left foraminal impingement at C3-4, C5-6, and C6-7 primarily due to spurring. Although there is some residual left foraminal impingement at the postoperative levels, the degree of impingement at these levels is much less than on the preoperative exam.      REVIEW OF SYSTEMS: Out of a complete 14 system review of symptoms, the patient complains only of the following symptoms, acid reflux, anxiety, back pain, and all other reviewed systems are negative.    ALLERGIES: Allergies  Allergen Reactions  . Penicillins Other (See Comments)    Seizure as a child  . Atarax [Hydroxyzine] Other (See Comments)    Headache, depression  . Celexa [Citalopram Hydrobromide] Other (See Comments)    Chest pain  . Erythromycin      Upset stomache  . Flagyl [Metronidazole]     Dizzy and increased heart rate  . Nortriptyline Itching  . Prilosec [Omeprazole]     Abdominal pain  . Requip [Ropinirole]     Made sx worse  . Tramadol Itching  . Darvocet [Propoxyphene  N-Acetaminophen] Itching  . Percocet [Oxycodone-Acetaminophen] Itching     HOME MEDICATIONS: Outpatient Medications Prior to Visit  Medication Sig Dispense Refill  . ALPRAZolam (XANAX) 0.25 MG tablet Take 1 tablet (0.25 mg total) by mouth 2 (two) times daily. 60 tablet 5  . Ascorbic Acid (VITAMIN C PO) Take 1 tablet by mouth daily. 500mg  capsule    . Bacillus Coagulans-Inulin (PROBIOTIC) 1-250 BILLION-MG CAPS     . baclofen (LIORESAL) 10 MG tablet Take 10 mg by mouth as needed for muscle spasms.     . Calcium Carbonate-Vit D-Min (CALCIUM 1200 PO) Take by mouth daily.    Marland Kitchen conjugated estrogens (PREMARIN) vaginal cream Place 1 Applicatorful vaginally 2 (two) times a week. 42.5 g 5  . denosumab (PROLIA) 60 MG/ML SOSY injection Inject 60 mg into the skin every 6 (six) months.    . DEPAKOTE ER 250 MG 24 hr tablet Take 1 tablet (250 mg total) by mouth daily. 30 tablet 5  . FAMOTIDINE PO Take 20 mg by mouth daily.     Marland Kitchen gabapentin (NEURONTIN) 100 MG capsule Take 200 mg by mouth daily.    Marland Kitchen L-LYSINE PO Take 1 tablet by mouth daily.     Marland Kitchen  Lactobacillus (ACIDOPHILUS) 100 MG CAPS Take 100 mg by mouth daily.    Marland Kitchen lidocaine (LIDODERM) 5 % Place 1 patch onto the skin as needed. Back pain    . meclizine (ANTIVERT) 25 MG tablet Take 25 mg by mouth 3 (three) times daily as needed for dizziness.    . mirtazapine (REMERON) 7.5 MG tablet TAKE ONE AND ONE HALF TABLET BY MOUTH NIGHTLY 45 tablet 5  . Multiple Vitamin (MULTIVITAMIN) tablet Take 1 tablet by mouth daily.    . pantoprazole (PROTONIX) 40 MG tablet Take 40 mg by mouth 2 (two) times daily.    . Rimegepant Sulfate (NURTEC) 75 MG TBDP Take 75 mg by mouth daily as needed. For migraines. Take as close to onset of migraine as possible. One daily maximum. 4 tablet 0  . sucralfate (CARAFATE) 1 g tablet Take 1 g by mouth 4 (four) times daily.    . traMADol (ULTRAM) 5 mg/mL SUSP     . traMADol (ULTRAM) 50 MG tablet Take 50 mg by mouth 3 (three) times daily as  needed. Currently taking 1x day    . valACYclovir (VALTREX) 500 MG tablet Take 500 mg by mouth 2 (two) times daily as needed (oral herpes).     . vitamin E 180 MG (400 UNITS) capsule Take 400 Units by mouth daily.    Marland Kitchen AIMOVIG 140 MG/ML SOAJ INJECT 140 MG INTO SKIN EVERY 30 DAYS 1 mL 1  . Cholecalciferol (VITAMIN D PO) Take 800 Units by mouth daily.    . magnesium 30 MG tablet Take 30 mg by mouth every other day.    . Turmeric (QC TUMERIC COMPLEX PO) Take 117 mg by mouth daily.    Marland Kitchen Ursodiol 200 MG CAPS Take 250 mg by mouth. Twice daily.     No facility-administered medications prior to visit.     PAST MEDICAL HISTORY: Past Medical History:  Diagnosis Date  . Allergy   . Arthritis   . Cervical dysplasia   . Endometriosis   . Fatigue   . GERD (gastroesophageal reflux disease)   . IBS (irritable bowel syndrome)   . Migraine   . Osteoporosis 01/2019   T score -3.1  . Plantar fasciitis   . PONV (postoperative nausea and vomiting)    also difficult to wake up  . Recurrent vaginitis   . Reflux   . Scoliosis   . Wears glasses      PAST SURGICAL HISTORY: Past Surgical History:  Procedure Laterality Date  . ANTERIOR CERVICAL DECOMP/DISCECTOMY FUSION  04/17/2011   Procedure: ANTERIOR CERVICAL DECOMPRESSION/DISCECTOMY FUSION 2 LEVELS;  Surgeon: Hosie Spangle;  Location: [facility];  Service: Neurosurgery;  Laterality: N/A;  Cervical five-six, six-seven anterior cervical decompression with fusion,  plating,  and bonegraft   . APPENDECTOMY  1978  . BREAST BIOPSY  07/13/2011   Procedure: BREAST BIOPSY WITH NEEDLE LOCALIZATION;  Surgeon: Rolm Bookbinder, MD;  Location: [facility];  Service: General;  Laterality: Right;  Right breast wire localization biopsy  . BREAST EXCISIONAL BIOPSY Right 2013  . BREAST SURGERY     Breast Bx-Benign  . CYSTOSCOPY    . GYNECOLOGIC CRYOSURGERY    . HERNIA REPAIR  08/02/1995   RIH  . LAPAROSCOPIC ENDOMETRIOSIS FULGURATION  1997   . PELVIC LAPAROSCOPY    . ROTATOR CUFF REPAIR     right 2002 left 2000     FAMILY HISTORY: Family History  Problem Relation Age of Onset  . Cancer Father  lymphoma  . Other Mother        bipolar,reflux  . Bipolar disorder Mother   . Other Brother        sinus problems  . Cancer Maternal Aunt        uterine cancer  . Breast cancer Maternal Aunt        40's  . Diabetes Maternal Aunt   . Cancer Paternal Aunt        Colon cancer  . Breast cancer Cousin 33       Mat. 1st cousin     SOCIAL HISTORY: Social History   Socioeconomic History  . Marital status: Married    Spouse name: Fritz Pickerel  . Number of children: 0  . Years of education: 54  . Highest education level: Not on file  Occupational History    Comment: [employer]  Tobacco Use  . Smoking status: Former Smoker    Quit date: 05/17/1983    Years since quitting: 37.4  . Smokeless tobacco: Never Used  Vaping Use  . Vaping Use: Never used  Substance and Sexual Activity  . Alcohol use: Yes    Alcohol/week: 1.0 - 2.0 standard drink    Types: 1 - 2 Standard drinks or equivalent per week    Comment: socially, occasional  . Drug use: No  . Sexual activity: Yes    Birth control/protection: Post-menopausal    Comment: intercourse age 72 , sexual partners more than 5  Other Topics Concern  . Not on file  Social History Narrative   Pt is married, no children.  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    Caffeine- very little.   Right handed   Lives at home with her husband   Social Determinants of Health   Financial Resource Strain: Not on file  Food Insecurity: Not on file  Transportation Needs: Not on file  Physical Activity: Not on file  Stress: Not on file  Social Connections: Not on file  Intimate Partner Violence: Not on file      PHYSICAL EXAM  Vitals:   10/20/20 0821  BP: 112/70  Pulse: 72  Weight: 123 lb 12.8 oz (56.2 kg)  Height: 5\' 8"  (1.727 m)    Body mass index is 18.82 kg/m.   Generalized: Well developed, in no acute distress  Cardiology: normal rate and rhythm, no murmur auscultated  Respiratory: clear to auscultation bilaterally    Neurological examination  Mentation: Alert oriented to time, place, history taking. Follows all commands speech and language fluent Cranial nerve II-XII: Pupils were equal round reactive to light. Extraocular movements were full, visual field were full on confrontational test. Facial sensation and strength were normal. Head turning and shoulder shrug  were normal and symmetric. Motor: The motor testing reveals 5 over 5 strength of all 4 extremities. Good symmetric motor tone is noted throughout.  Gait and station: Gait is normal.     DIAGNOSTIC DATA (LABS, IMAGING, TESTING) - I reviewed patient records, labs, notes, testing and imaging myself where available.  Lab Results  Component Value Date   WBC 3.4 (L) 12/21/2019   HGB 12.5 12/21/2019   HCT 37.0 12/21/2019   MCV 93.7 12/21/2019   PLT 160 12/21/2019      Component Value Date/Time   NA 134 03/26/2020 1056   K 3.9 03/26/2020 1056   CL 98 03/26/2020 1056   CO2 25 03/26/2020 1056   GLUCOSE 83 03/26/2020 1056   GLUCOSE 98 12/22/2019 1230  BUN 8 03/26/2020 1056   CREATININE 0.79 03/26/2020 1056   CALCIUM 9.4 03/26/2020 1056   PROT 7.6 12/21/2019 0949   ALBUMIN 4.3 12/21/2019 0949   AST 24 12/21/2019 0949   ALT 24 12/21/2019 0949   ALKPHOS 42 12/21/2019 0949   BILITOT 0.4 12/21/2019 0949   GFRNONAA 79 03/26/2020 1056   GFRAA 91 03/26/2020 1056   No results found for: CHOL, HDL, LDLCALC, LDLDIRECT, TRIG, CHOLHDL No results found for: HGBA1C No results found for: VITAMINB12 No results found for: TSH  No flowsheet data found.   No flowsheet data found.   ASSESSMENT AND PLAN  65 y.o. year old female  has a past medical history of Allergy, Arthritis, Cervical dysplasia, Endometriosis, Fatigue, GERD (gastroesophageal  reflux disease), IBS (irritable bowel syndrome), Migraine, Osteoporosis (01/2019), Plantar fasciitis, PONV (postoperative nausea and vomiting), Recurrent vaginitis, Reflux, Scoliosis, and Wears glasses. here with     Migraine without aura and with status migrainosus, not intractable  Jaci is doing very well from a headache standpoint. We will continue Amovig and Nurtec. She will continue to work with care team for back pain, anxiety and GERD. We have discussed side effects of Amovig. I am not certain this is contributing to GERD but she will continue to monitor. If symptoms are unimproved following upcoming surgery, we may consider trial of Emgality. She has failed Ajovy. Healthy lifestyle habits encouraged. She will follow up with me in 1 year, sooner if needed.   No orders of the defined types were placed in this encounter.    Meds ordered this encounter  Medications  . AIMOVIG 140 MG/ML SOAJ    Sig: INJECT 140 MG INTO SKIN EVERY 30 DAYS    Dispense:  3 mL    Refill:  3    Order Specific Question:   Supervising Provider    Answer:   Melvenia Beam [[id number]]      GQQ PYPPJ, MSN, FNP-C 10/20/2020, 8:51 AM  [facility] [address] [place], [place] [address] [phone]

## 2020-10-20 ENCOUNTER — Encounter: Payer: Self-pay | Admitting: Family Medicine

## 2020-10-20 ENCOUNTER — Ambulatory Visit: Payer: 59 | Admitting: Family Medicine

## 2020-10-20 VITALS — BP 112/70 | HR 72 | Ht 68.0 in | Wt 123.8 lb

## 2020-10-20 DIAGNOSIS — G43001 Migraine without aura, not intractable, with status migrainosus: Secondary | ICD-10-CM | POA: Diagnosis not present

## 2020-10-20 MED ORDER — AIMOVIG 140 MG/ML ~~LOC~~ SOAJ: SUBCUTANEOUS | 3 refills | Status: AC

## 2020-10-25 NOTE — Telephone Encounter (Signed)
Pt states that she has [employer] as of July 1st. She sent her ID number through another message. She can take a picture of her card, or bring it to the office. Please advise.

## 2020-10-27 NOTE — Telephone Encounter (Signed)
VOB initiated with updated insurance information effective 11/12/20.  Medicare: [id number] [place] Supplemental: [id number]

## 2020-11-01 NOTE — Telephone Encounter (Signed)
Patient called to go ahead and schedule her Prolia shot for 11/16/20 so it will be at least 10 days before her surgery.  Please advise once payment information is available so that we can update her appointment notes.

## 2020-11-11 ENCOUNTER — Ambulatory Visit (INDEPENDENT_AMBULATORY_CARE_PROVIDER_SITE_OTHER): Payer: 59 | Admitting: Internal Medicine

## 2020-11-11 ENCOUNTER — Encounter: Payer: Self-pay | Admitting: Internal Medicine

## 2020-11-11 ENCOUNTER — Other Ambulatory Visit: Payer: Self-pay

## 2020-11-11 VITALS — BP 100/60 | HR 72 | Ht 64.0 in | Wt 122.8 lb

## 2020-11-11 DIAGNOSIS — M810 Age-related osteoporosis without current pathological fracture: Secondary | ICD-10-CM

## 2020-11-11 DIAGNOSIS — Z713 Dietary counseling and surveillance: Secondary | ICD-10-CM

## 2020-11-11 NOTE — Patient Instructions (Addendum)
Please continue with Prolia as discussed.  Try to get 1200 mg calcium a day mostly from the diet.  Please stop at the lab.  Please come back for a follow-up appointment in 1 year.

## 2020-11-11 NOTE — Progress Notes (Signed)
Patient ID: Yolanda Carroll, female   DOB: January 30, 1956, 65 y.o.   MRN: [id number]   This visit occurred during the SARS-CoV-2 public health emergency.  Safety protocols were in place, including screening questions prior to the visit, additional usage of staff PPE, and extensive cleaning of exam room while observing appropriate contact time as indicated for disinfecting solutions.   HPI  Yolanda Carroll is a 65 y.o.-year-old female, initially referred by Dr. Phineas Real, presenting for follow-up for osteoporosis.  Last visit 8 months ago.  Interim history: At last visit, I saw her after her COVID-19 episode from 12/2019.  She has slowly recovered afterwards, but still has GI sxs. No diarrhea., decreased appetite, nausea. She sees integrative medicine.  She has anxiety and sees Dr. Clovis Pu. Before last visit she had steroid injections in back due to significant back pain.  She is now preparing for fusion surgery next month.  Reviewed history: She was diagnosed with osteoporosis in early 2000.  Reviewed patient's DXA scan reports: Date L1-L4 T score FN T score 33% distal Radius (left)  Ultra distal radius (left)  10/19/2020 ([facility]) N/a RFN: -2.8 (+5%) LFN: -2.5 (-1.4%) +0.4 (+4%) N/a  02/11/2019 ([facility]) N/a due to scoliosis RFN: -3.1 (-8.2%*) LFN: -2.4 (-4.7%)  -0.1 (-9.6%*)  0.0  02/06/2017 ([facility]) -0.3 (moderate scoliosis) RFN: -2.6 LFN: -2.2  +1.1 -0.1  12/29/2014 -0.5 (moderate scoliosis) RFN: -2.6 LFN: -1.7 n/a n/a  12/12/2012 -0.5 (moderate scoliosis) RFN: -2.4 LFN: -1.9 n/a n/a  10/04/2010 n/a RFN: -2.3 LFN: -1.8 n/a n/a   She denies dizziness/orthostasis/poor vision.  No falls or fractures since last visit.  She had one episode of vertigo for 2 weeks in 2018.   She has a history of steroid use: P.o. and IM for headaches, intraspinal for OA.  Reviewed previous osteoporosis treatments: - Actonel in 2006-2007 - Forteo for 2 years in 2008 - Boniva in 2010 - HRT:  Estradiol 0.5, Provera 2.5 - now off - Prolia -05/02/2019, 11/11/2019, 05/13/2020  She did have increased back pain after starting Prolia and she believed that these may be related.  She does have a history of back pain and right hip pain when walking, but in the past they were responsive to steroids and not persisting long-term, but now she has been having pain for almost a whole year.  She was seen physical therapy for osteoarthritis in the past.  She is now in PT for back pain at [facility].  Received labs from PCP, from 02/02/2020: -Hemoglobin normal at 12.4 (12-16) -Lipids: 194/136/52/118 -TSH 2.53  No history of vitamin D deficiency: 02/02/2020: Vitamin D 68.8 Lab Results  Component Value Date   VD25OH 49.3 12/04/2019   VD25OH 70.27 03/04/2019   VD25OH 55 03/13/2017  02/11/2018: Vit D 61.9 04/30/2017: vit D 53.4  She is on: - calcium citrate >> 250 mg 1-2 a day - Vitamin D >> approximately 2000 units daily (15,000 units a week)  No weightbearing exercises.  She can barely walk due to back pain.  She cannot sit down for more than 10 minutes because of this.  She is not taking high vitamin A doses.  Menopause was at 69s y/o.   Pt does have a FH of osteoporosis in mother, who had a hip fracture.  No history of kidney stones or hyperparathyroidism: Lab Results  Component Value Date   CALCIUM 9.4 03/26/2020   CALCIUM 8.4 (L) 12/22/2019   CALCIUM 8.3 (L) 12/21/2019   CALCIUM 9.4  12/04/2019   CALCIUM 9.5 03/04/2019   CALCIUM 9.5 02/21/2012   CALCIUM 9.8 07/10/2011  02/02/2020: corrected calcium 8.74 (8.6-10.3) 02/11/2018: Ca normal  No history of thyrotoxicosis: 02/02/2020: TSH 2.53 02/11/2018: TSH 2.79 05/01/2017: TSH 1.71 04/28/2013: TSH 2.063 No results found for: TSH   No CKD. Last BUN/Cr: Lab Results  Component Value Date   BUN 8 03/26/2020   CREATININE 0.79 03/26/2020   On Depakote for migraines.  ROS: Constitutional: no weight gain/no weight  loss, no fatigue, no subjective hyperthermia, no subjective hypothermia Eyes: no blurry vision, no xerophthalmia ENT: no sore throat, no nodules palpated in neck, no dysphagia, no odynophagia, no hoarseness Cardiovascular: no CP/no SOB/no palpitations/no leg swelling Respiratory: no cough/no SOB/no wheezing Gastrointestinal:+ N/no V/no D/no C/no acid reflux/+ abdominal discomfort Musculoskeletal: + muscle aches/+ joint aches Skin: no rashes, no hair loss Neurological: no tremors/no numbness/no tingling/no dizziness  I reviewed pt's medications, allergies, PMH, social hx, family hx, and changes were documented in the history of present illness. Otherwise, unchanged from my initial visit note.  Past Medical History:  Diagnosis Date   Allergy    Arthritis    Cervical dysplasia    Endometriosis    Fatigue    GERD (gastroesophageal reflux disease)    IBS (irritable bowel syndrome)    Migraine    Osteoporosis 01/2019   T score -3.1   Plantar fasciitis    PONV (postoperative nausea and vomiting)    also difficult to wake up   Recurrent vaginitis    Reflux    Scoliosis    Wears glasses    Past Surgical History:  Procedure Laterality Date   ANTERIOR CERVICAL DECOMP/DISCECTOMY FUSION  04/17/2011   Procedure: ANTERIOR CERVICAL DECOMPRESSION/DISCECTOMY FUSION 2 LEVELS;  Surgeon: Hosie Spangle;  Location: [facility];  Service: Neurosurgery;  Laterality: N/A;  Cervical five-six, six-seven anterior cervical decompression with fusion,  plating,  and bonegraft    APPENDECTOMY  1978   BREAST BIOPSY  07/13/2011   Procedure: BREAST BIOPSY WITH NEEDLE LOCALIZATION;  Surgeon: Rolm Bookbinder, MD;  Location: [facility];  Service: General;  Laterality: Right;  Right breast wire localization biopsy   BREAST EXCISIONAL BIOPSY Right 2013   BREAST SURGERY     Breast Bx-Benign   CYSTOSCOPY     GYNECOLOGIC CRYOSURGERY     HERNIA REPAIR  08/02/1995   RIH   LAPAROSCOPIC  ENDOMETRIOSIS FULGURATION  1997   PELVIC LAPAROSCOPY     ROTATOR CUFF REPAIR     right 2002 left 2000   Social History   Socioeconomic History   Marital status: Married    Spouse name: Fritz Pickerel   Number of children: 0   Years of education: 16  Social Needs  Occupational History    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  Occupation: employed  at [employer].    Caffeine- very little.   Current Outpatient Medications on File Prior to Visit  Medication Sig Dispense Refill   AIMOVIG 140 MG/ML SOAJ INJECT 140 MG INTO SKIN EVERY 30 DAYS 3 mL 3   ALPRAZolam (XANAX) 0.25 MG tablet Take 1 tablet (0.25 mg total) by mouth 2 (two) times daily. 60 tablet 5   Ascorbic Acid (VITAMIN C PO) Take 1 tablet by mouth daily. 500mg  capsule     Bacillus Coagulans-Inulin (PROBIOTIC) 1-250 BILLION-MG CAPS      baclofen (LIORESAL) 10 MG tablet Take 10 mg by mouth as needed for muscle spasms.      Calcium Carbonate-Vit D-Min (CALCIUM 1200 PO) Take by mouth daily.     conjugated estrogens (PREMARIN) vaginal cream Place 1 Applicatorful vaginally 2 (two) times a week. 42.5 g 5   denosumab (PROLIA) 60 MG/ML SOSY injection Inject 60 mg into the skin every 6 (six) months.     DEPAKOTE ER 250 MG 24 hr tablet Take 1 tablet (250 mg total) by mouth daily. 30 tablet 5   FAMOTIDINE PO Take 20 mg by mouth daily.      gabapentin (NEURONTIN)  100 MG capsule Take 200 mg by mouth daily.     L-LYSINE PO Take 1 tablet by mouth daily.      Lactobacillus (ACIDOPHILUS) 100 MG CAPS Take 100 mg by mouth daily.     lidocaine (LIDODERM) 5 % Place 1 patch onto the skin as needed. Back pain     meclizine (ANTIVERT) 25 MG tablet Take 25 mg by mouth 3 (three) times daily as needed for dizziness.     mirtazapine (REMERON) 7.5 MG tablet TAKE ONE AND ONE HALF TABLET BY MOUTH NIGHTLY 45 tablet 5   Multiple Vitamin (MULTIVITAMIN) tablet Take 1 tablet by mouth daily.     pantoprazole (PROTONIX) 40 MG tablet Take 40 mg by mouth 2 (two) times daily.     Rimegepant Sulfate (NURTEC) 75 MG TBDP Take 75 mg by mouth daily as needed. For migraines. Take as close to onset of migraine as possible. One daily maximum. 4 tablet 0   sucralfate (CARAFATE) 1 g tablet Take 1 g by mouth 4 (four) times daily.     traMADol (ULTRAM) 5 mg/mL SUSP      traMADol (ULTRAM) 50 MG tablet Take 50 mg by mouth 3 (three) times daily as needed. Currently taking 1x day     valACYclovir (VALTREX) 500 MG tablet Take 500 mg by mouth 2 (two) times daily as needed (oral herpes).      vitamin E 180 MG (400 UNITS) capsule Take 400 Units by mouth daily.     No current facility-administered medications on file prior to visit.   Allergies  Allergen Reactions   Penicillins Other (See Comments)    Seizure as a child   Atarax [Hydroxyzine] Other (See Comments)    Headache, depression   Celexa [Citalopram Hydrobromide] Other (See Comments)    Chest pain   Erythromycin      Upset stomache   Flagyl [Metronidazole]     Dizzy and increased heart rate   Nortriptyline Itching   Prilosec [Omeprazole]     Abdominal pain   Requip [Ropinirole]     Made sx worse   Tramadol Itching   Darvocet [Propoxyphene N-Acetaminophen] Itching   Percocet [Oxycodone-Acetaminophen] Itching   Family History  Problem Relation Age of Onset   Cancer Father        lymphoma  Other Mother        bipolar,reflux    Bipolar disorder Mother    Other Brother        sinus problems   Cancer Maternal Aunt        uterine cancer   Breast cancer Maternal Aunt        40's   Diabetes Maternal Aunt    Cancer Paternal Aunt        Colon cancer   Breast cancer Cousin 28       Mat. 1st cousin   PE: There were no vitals taken for this visit. Wt Readings from Last 3 Encounters:  10/20/20 123 lb 12.8 oz (56.2 kg)  09/08/20 125 lb (56.7 kg)  04/30/20 115 lb (52.2 kg)   Constitutional:  Normal weight, in NAD Eyes: PERRLA, EOMI, no exophthalmos ENT: moist mucous membranes, no thyromegaly, no cervical lymphadenopathy Cardiovascular: RRR, No MRG Respiratory: CTA B Gastrointestinal: abdomen soft, NT, ND, BS+ Musculoskeletal: + Significant scoliosis, strength intact in all 4 Skin: moist, warm, no rashes Neurological: no tremor with outstretched hands, DTR normal in all 4  Assessment: 1. Osteoporosis  2.  Nutrition counseling  Plan: 1. Osteoporosis -Likely age-related + postmenopausal + she also has a family history of osteoporosis -I reviewed her bone density reports from 01/2017 and 01/2019 and it appears that both her femoral neck T-scores have decreased while only the right femoral neck decrease was significant.  She also had bone mineral density decrease in her radius.  Her spine could not be analyzed in 2020 due to scoliosis.  After the results returned, she agreed to start Prolia.  Latest bone density scan was from 10/19/2020 and this showed stability of her T-scores.  I reviewed this report with her. -Her first Prolia injection was in 04/2019.  She had back pain, but this was chronic and we discussed that it was most likely not related to Prolia. -At last visit, she scheduled the appointment to discuss about possibly stopping all osteoporosis medications.  I advised her not to do so.  I explained that stopping Prolia causes an abrupt decrease in bone density and a much higher risk for fracture.  She  agreed to continue Prolia.  Her latest injection was on 05/13/2020.  Qualifies for another injection tomorrow. -She continues to have back pain and she has a T4-ilium fusion surgery planned for 11/30/2020. -She continues on 2000 units vitamin D daily.  Latest vitamin D was normal, at 49.3 on 12/04/2019.  We will repeat a level today. -Calcium and kidney function were normal in 03/2020 -She cannot exercise due to back pain.  I am hoping that her back surgery will help when she can start weightbearing exercises after she heals. -For now, I advised her to continue Prolia, and we can continue for 10 years or more, since she appears to respond well to it -I will see her back in a year  2.  Nutrition counseling -At today's visit, we again discussed about trying to aim for an alkaline diet.  She obtains them.  I recommended that I recommended at last visit and she eats  a more balanced diet.  She used urine strips from her integrative medicine provider and they showed alkaline urine. -She asks me my advice about how much calcium to take.  She has calcium citrate 250 mg tablets.  I advised her to try to get stool approximately 1200 mg of calcium per day from the diet, however, if she cannot amount to this,  to supplement with calcium citrate, but no more than 500-600 mg per meal. -We will check a vitamin D level today and for now will continue her 15000 units vitamin D daily  Component     Latest Ref Rng & Units 11/11/2020  Vitamin D, 25-Hydroxy     30.0 - 100.0 ng/mL 75.3  Normal vitamin D level.  Philemon Kingdom, MD PhD [facility]

## 2020-11-12 ENCOUNTER — Encounter: Payer: Self-pay | Admitting: Family Medicine

## 2020-11-12 LAB — VITAMIN D 25 HYDROXY (VIT D DEFICIENCY, FRACTURES): Vit D, 25-Hydroxy: 75.3 ng/mL (ref 30.0–100.0)

## 2020-11-14 ENCOUNTER — Other Ambulatory Visit: Payer: Self-pay | Admitting: Neurology

## 2020-11-16 ENCOUNTER — Other Ambulatory Visit: Payer: Self-pay

## 2020-11-16 ENCOUNTER — Ambulatory Visit (INDEPENDENT_AMBULATORY_CARE_PROVIDER_SITE_OTHER): Payer: Medicare Other

## 2020-11-16 DIAGNOSIS — M810 Age-related osteoporosis without current pathological fracture: Secondary | ICD-10-CM

## 2020-11-16 MED ORDER — DENOSUMAB 60 MG/ML ~~LOC~~ SOSY
60.0000 mg | PREFILLED_SYRINGE | Freq: Once | SUBCUTANEOUS | Status: AC
  Administered 2020-11-16: 60 mg via SUBCUTANEOUS

## 2020-11-16 NOTE — Progress Notes (Signed)
Administered prolia injection in right arm. Patient tolerated well.

## 2020-11-16 NOTE — Telephone Encounter (Signed)
Submitted PA Aimovig on CMM. Key: [id number]. Marked urgent. Waiting on determination from [facility].

## 2020-11-16 NOTE — Telephone Encounter (Signed)
Pt received Prolia inj 11/16/20 Next inj due 05/20/21

## 2020-11-18 ENCOUNTER — Telehealth: Payer: Self-pay

## 2020-11-18 ENCOUNTER — Telehealth: Payer: Self-pay | Admitting: Family Medicine

## 2020-11-18 ENCOUNTER — Encounter: Payer: Self-pay | Admitting: Psychiatry

## 2020-11-18 ENCOUNTER — Ambulatory Visit (INDEPENDENT_AMBULATORY_CARE_PROVIDER_SITE_OTHER): Payer: Medicare Other | Admitting: Psychiatry

## 2020-11-18 ENCOUNTER — Other Ambulatory Visit: Payer: Self-pay

## 2020-11-18 DIAGNOSIS — F411 Generalized anxiety disorder: Secondary | ICD-10-CM | POA: Diagnosis not present

## 2020-11-18 DIAGNOSIS — F5105 Insomnia due to other mental disorder: Secondary | ICD-10-CM

## 2020-11-18 DIAGNOSIS — K219 Gastro-esophageal reflux disease without esophagitis: Secondary | ICD-10-CM

## 2020-11-18 DIAGNOSIS — F32A Depression, unspecified: Secondary | ICD-10-CM

## 2020-11-18 DIAGNOSIS — F410 Panic disorder [episodic paroxysmal anxiety] without agoraphobia: Secondary | ICD-10-CM

## 2020-11-18 DIAGNOSIS — G43009 Migraine without aura, not intractable, without status migrainosus: Secondary | ICD-10-CM

## 2020-11-18 DIAGNOSIS — F320 Major depressive disorder, single episode, mild: Secondary | ICD-10-CM

## 2020-11-18 NOTE — Telephone Encounter (Signed)
Called pt back. She states Aimovig will cost her 297.00 per month even with PA approval on file. Requesting we do tier exception to lower from tier 4 to tier 3. Then it will only cost her 40.00. she has tried the other CGRP inj and they were ineffective.  Tried initiating tier exception on CMM. Key: [id number]. Received the following message: "To initiate an authorization request for this medication, please contact [facility] at [phone]." I called and spoke w/ rep, Larena Glassman. Ref# Submitted clinical info over the phone. [id number]. Turn around time is 24-72 hours. They will notify our office of the determination for tier exception.

## 2020-11-18 NOTE — Telephone Encounter (Signed)
Pt has called asking that her insurance company be called at [phone] for an exception to be filed.  Pt states she is unable to take the other medications.  If the exception is filed she will only have to pay $40.00 instead of $300.00 Please call pt on her mobile to discuss.

## 2020-11-18 NOTE — Telephone Encounter (Signed)
Called pt. Advised tier exception denied stating: "your drug is a biological drug.  An order for biological drugs to qualify for an exception in the cost sharing tier, the eligible lower cost it would be sharing tier must contain other biological drugs to treat your conditions.  There are no biological drugs and the eligible lower cost sharing tier to treat your condition.  Therefore, your drug is excluded from an exception to cost sharing tier." She is going to try and appeal this.   Pt will come pick up sample of Aimovig 11/19/20 between 8-12pm (1 box of 140mg ) Lot: [id number], exp: 10/12/21. [place]: [id number].   She will look into whether she qualifies for [employer]. Phone: [phone]. When she previously checked, she did not qualify. She will call again.  She may be ok w/ re-trying a different CGRP injectable. She does not want to right now because she is having surgery in a couple week and has been migraine free for months.

## 2020-11-18 NOTE — Progress Notes (Signed)
[name] [id number] [birth year] 65 y.o.   Subjective:   Patient ID:  Yolanda Carroll is a 65 y.o. (DOB 01-05-1956) female.  Chief Complaint:  Chief Complaint  Patient presents with   Anxiety   Follow-up   Stress    Medical problems    Anxiety Symptoms include nausea. Patient reports no confusion, decreased concentration, nervous/anxious behavior or suicidal ideas.    Yolanda Carroll presents to the office today for follow-up of anxiety and migraine.  seen in July 2020.  No meds were changed.  She requires name brand Depakote because the generic caused worsening headaches.  Retired November 14, 2018.  Was so burned out at work.  Helping with church projects and helping friends.  Focusing on her health and doing PT for back and hip.  No kids or gkids.  Helps care for 85 yo M-in-law.  H is also busy which helps.  Given a surprise retirement party.     seen January 2021.  Nefazodone is no longer manufactured and she had to wean off.  We also discussed the potential of weaning off Depakote because Aimovig had been very effective at managing her migraine headache.  She called August 11, 2019 stating that she did taper off of Depakote because she did not feel like she needed it any more.  Her last dose was July 23, 2019 but afterwards noticed heartburn and swallowing issues.  Her GI doctor added Pepcid 40 mg daily.  Her neurologist had indicated that Depakote can sometimes help with esophageal issues and she wondered if that was connected.  She was given the option to restart a lower dose and perhaps taper more slowly.  Off Depakote for a month.  She elected not to restart it.  08/19/2019 appointment the following is noted: Tinnitus and mild anxiety worse off the Depakote and with some mind racing esp in the AM.   Most is better. Reflux is worse off Depakote ER 250 and wondering if she should restart it.   Disc article on Depakote helping GERD by increasing lower esophageal sphincter  tone. Has travelled some with family events.  M-in-law 74 soon.   HA good with Aimovig. Don't do season changes well and more HA in Spring often. Sleep not great lately with leg cramps.  Started more exercise.  Started snoring and H is a light sleeper.  Meds help. Plan no med changes.  10/03/2019 phone call with patient asking to restart residual nefazodone that she has on hand.  She felt that it helped back pain and she is experiencing more back pain recently.  She is aware that it is no longer manufactured to our knowledge and once she runs out it will not be available.  She indicated she had an off to take half of 150 mg nefazodone tablet for about 2 to 3 months and would like to do so.  11/05/2019 appointment with the following noted:  Appt moved earlier DT the following. Had stopped Depakote and nefazadone and reflux got worse.  Restarting Depakote didn't help much.  Restarted nefazodaone and reflux better right away.  May not need Depakote but doesn't want to change.  Can't exercise DT back pain since Jan ans worse since March.  Getting new doctor.  More depressed and anxious and sleep problems DT back pain.   Usually only 0.25 mg alprazolam at night. Plan: has to stop nefazodone when it runs out bc no longer manufactured.  Therefore will try trazodone in it's place.  01/07/20 appt with the following noted: She and H both got Covid.  Was pretty sick for 10 days.  Lost taste.  Had little resp stuff but severe diarrhea and nausea and couldn't keep fluids down.  No residual sx. H had resp sx with pneumonia and had to get O2.  Hosp for 3 days. Still have some nefazodone and nursing it along. Tried trazodone 50 HS for 2 nights and didn't sleep well and had a HA  Nefazodone and Depakote both helped the GERD.  She has enough nefazadone for about 6 weeks. Plan: Nefazodone helped the reflux and she wants to take it as long as possible.  She didn't have withdrawal off nefazodone. When she runs out  then start trazodone.  If that fails then use Viibryd.  Disc SE and alternatives.   Rec try trazodone again but use 100 mg and see if sleeping better prevents the AM HA.  If HA recurs then viibryd. Starter kit.  Depakote didn't help as much as expected and she might stop it.   02/18/2020 phone call: Jeaneane called to report that she is experiencing high anxiety.  It is causing chest discomfort and breathing problems. Has appt 11/18 and is on the wait list but needs to discuss how to relief this anxiety.  Please call. Response: Note Rtc to patient, she does have the Viibryd and does remember that discussion. She has not been able to start that because she received a steroid injection a couple weeks ago and caused her stomach to be upset. She didn't want to start it knowing that's a side effect. She feels like the steroid injection may have worsened all her symptoms. She did try the trazodone but it was too much for her, even taking a 1/2 tablet. She does have some serzone left and has been taking that until this improves. Also taking Xanax as needed during day if needed. Hoping all of this will improve soon. Informed her I would update Dr. Clovis Pu with her information.        04/01/2020 appointment with the following noted: Out of nefazodone a few weeks ago.  More reflux off it.  Tried trazodone a couple of times.  Tried 100 mg trazodone and heart skipped.  50 mg awoke with chest tightness.  Long haul Covid with GI sx.  Nausea and anxiety problems daily since mid September.  Covid early August and got monoclonal infusion which helped.  Headed to long haul Covid clinic in [place] Monday.   Needed to increase alprazolam to BID.  And 80% better with that.  Panic some out of bed.  Sx started after steroid shot for her back 9/21.   Hasn't tried Viibryd DT fear of GI worsening.  Poor appetite and lost 15#. Not taken tramadol yet. Migraine under control. Plan: no med changes  06/01/20 appt with following  noted: Starting generic Depakote ER generic today. Started Mirtazapine 3.75 mg and it worked right away for nausea for a week.  Then increased to 7.5 mg HS.  Most days is feeling really well.  No SE except mild drowsiness. Still has post Covid problems with stomach but much better.  Regained 7# of needed weight.  Need to try to gain 10# more. Zofran failed. Had a lot anxiety with N but it's better now.  Was waking with panic and no longer N and anxiety better too. Severe scoliosis facing big surgery in summer with long recovery.  Can't ride in car for long. Plan: No med  changes  08/18/2020 appointment with the following noted: Gained 12# and thankful for mirtazapine. Long Covid GI sx.  Better not gone with mirtazapine. Rare AM panic.  Sleep good. Scoliosis and may require surgery. Can't sit or stand for long Depakote brand ER 250 helps heartburn. Generic failed. Patient denies difficulty with sleep initiation with Xanax.. Denies appetite disturbance.  Patient reports that energy and motivation have been good.  Patient denies any difficulty with concentration.  Patient denies any suicidal ideation. More depressed and tearful with pain but varies with pain levels.  Good and bad days.  Not too depressed with anxiety worse. Plan: Continue mirtazapine 7.5 mg with Xanax for GI problems including nausea and appetite.  It has worked some.  Try higher dose. Insomnia managed with  With alprazolam.    11/18/2020 appointment with the following noted: Increased mirtazapine to 7.5 mg 1 and 1/2 tablet bc  less anxious with it. Facing big back surgery for scoliosis 11/30/20.  Can't drive in car for distance or travel. Now on Medicare. Depakote ER BRAND helped GI problems which were worse on generic. Not markedly depressed.  Patient reports stable mood and denies depressed or irritable moods.   Patient denies difficulty with sleep initiation or maintenance. Denies appetite disturbance.  Patient reports that  energy and motivation have been good.  Patient denies any difficulty with concentration.  Patient denies any suicidal ideation.  Saw neurologist for Migraine and had problems with the meds RX.  Aimovig has helped..    Past Psychiatric Medication Trials: Failed multiple other antidepressants,  nefazodone & Depakote for migraine,  Depakote brand ER 250 helps heartburn. Generic failed. Mirtazapine 7.5 mg benefit citalopram palpitations Trazodone Clonazepam did not help anxiety, alprazolam helps anxiety and sleep  Review of Systems:  Review of Systems  Gastrointestinal:  Positive for nausea. Negative for abdominal pain.  Musculoskeletal:  Positive for arthralgias and back pain.  Neurological:  Negative for tremors, weakness and headaches.  Psychiatric/Behavioral:  Negative for agitation, behavioral problems, confusion, decreased concentration, dysphoric mood, hallucinations, self-injury, sleep disturbance and suicidal ideas. The patient is not nervous/anxious and is not hyperactive.    Medications: I have reviewed the patient's current medications.  Current Outpatient Medications  Medication Sig Dispense Refill   AIMOVIG 140 MG/ML SOAJ INJECT 140 MG INTO SKIN EVERY 30 DAYS 3 mL 3   ALPRAZolam (XANAX) 0.25 MG tablet Take 1 tablet (0.25 mg total) by mouth 2 (two) times daily. 60 tablet 5   Ascorbic Acid (VITAMIN C PO) Take 1 tablet by mouth daily. 568m capsule     Bacillus Coagulans-Inulin (PROBIOTIC) 1-250 BILLION-MG CAPS      baclofen (LIORESAL) 10 MG tablet Take 10 mg by mouth as needed for muscle spasms.      Calcium Carbonate-Vit D-Min (CALCIUM 1200 PO) Take by mouth daily.     conjugated estrogens (PREMARIN) vaginal cream Place 1 Applicatorful vaginally 2 (two) times a week. 42.5 g 5   denosumab (PROLIA) 60 MG/ML SOSY injection Inject 60 mg into the skin every 6 (six) months.     DEPAKOTE ER 250 MG 24 hr tablet Take 1 tablet (250 mg total) by mouth daily. 30 tablet 5   FAMOTIDINE PO  Take 20 mg by mouth daily.      gabapentin (NEURONTIN) 100 MG capsule Take 200 mg by mouth daily.     L-LYSINE PO Take 1 tablet by mouth daily.      Lactobacillus (ACIDOPHILUS) 100 MG CAPS Take 100 mg by mouth daily.  lansoprazole (PREVACID) 30 MG capsule Take 30 mg by mouth 2 (two) times daily before a meal.     lidocaine (LIDODERM) 5 % Place 1 patch onto the skin as needed. Back pain     meclizine (ANTIVERT) 25 MG tablet Take 25 mg by mouth 3 (three) times daily as needed for dizziness.     mirtazapine (REMERON) 7.5 MG tablet TAKE ONE AND ONE HALF TABLET BY MOUTH NIGHTLY 45 tablet 5   Multiple Vitamin (MULTIVITAMIN) tablet Take 1 tablet by mouth daily.     Rimegepant Sulfate (NURTEC) 75 MG TBDP Take 75 mg by mouth daily as needed. For migraines. Take as close to onset of migraine as possible. One daily maximum. 4 tablet 0   traMADol (ULTRAM) 50 MG tablet Take 50 mg by mouth 3 (three) times daily as needed. Currently taking 1x day     valACYclovir (VALTREX) 500 MG tablet Take 500 mg by mouth 2 (two) times daily as needed (oral herpes).      vitamin E 180 MG (400 UNITS) capsule Take 400 Units by mouth daily.     pantoprazole (PROTONIX) 40 MG tablet Take 40 mg by mouth 2 (two) times daily. (Patient not taking: Reported on 11/18/2020)     sucralfate (CARAFATE) 1 g tablet Take 1 g by mouth 4 (four) times daily. (Patient not taking: Reported on 11/18/2020)     No current facility-administered medications for this visit.    Medication Side Effects: None  Allergies:  Allergies  Allergen Reactions   Penicillins Other (See Comments)    Seizure as a child   Atarax [Hydroxyzine] Other (See Comments)    Headache, depression   Celexa [Citalopram Hydrobromide] Other (See Comments)    Chest pain   Erythromycin      Upset stomache   Flagyl [Metronidazole]     Dizzy and increased heart rate   Nortriptyline Itching   Prilosec [Omeprazole]     Abdominal pain   Requip [Ropinirole]     Made sx  worse   Tramadol Itching   Darvocet [Propoxyphene N-Acetaminophen] Itching   Percocet [Oxycodone-Acetaminophen] Itching    Past Medical History:  Diagnosis Date   Allergy    Arthritis    Cervical dysplasia    Endometriosis    Fatigue    GERD (gastroesophageal reflux disease)    IBS (irritable bowel syndrome)    Migraine    Osteoporosis 01/2019   T score -3.1   Plantar fasciitis    PONV (postoperative nausea and vomiting)    also difficult to wake up   Recurrent vaginitis    Reflux    Scoliosis    Wears glasses     Family History  Problem Relation Age of Onset   Cancer Father        lymphoma   Other Mother        bipolar,reflux   Bipolar disorder Mother    Other Brother        sinus problems   Cancer Maternal Aunt        uterine cancer   Breast cancer Maternal Aunt        40's   Diabetes Maternal Aunt    Cancer Paternal Aunt        Colon cancer   Breast cancer Cousin 75       Mat. 1st cousin    Social History   Socioeconomic History   Marital status: Married    Spouse name: Fritz Pickerel   Number of children: 0  Years of education: 67   Highest education level: Not on file  Occupational History    Comment: [employer]  Tobacco Use   Smoking status: Former    Pack years: 0.00    Types: Cigarettes    Quit date: 05/17/1983    Years since quitting: 37.5   Smokeless tobacco: Never  Vaping Use   Vaping Use: Never used  Substance and Sexual Activity   Alcohol use: Yes    Alcohol/week: 1.0 - 2.0 standard drink    Types: 1 - 2 Standard drinks or equivalent per week    Comment: socially, occasional   Drug use: No   Sexual activity: Yes    Birth control/protection: Post-menopausal    Comment: intercourse age 6 , sexual partners more than 5  Other Topics Concern   Not on file  Social History Narrative   Pt is married, no children.  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    Caffeine- very little.   Right handed   Lives at  home with her husband   Social Determinants of Health   Financial Resource Strain: Not on file  Food Insecurity: Not on file  Transportation Needs: Not on file  Physical Activity: Not on file  Stress: Not on file  Social Connections: Not on file  Intimate Partner Violence: Not on file    Past Medical History, Surgical history, Social history, and Family history were reviewed and updated as appropriate.   Please see review of systems for further details on the patient's review from today.   Objective:   Physical Exam:  There were no vitals taken for this visit.  Physical Exam Constitutional:      General: She is not in acute distress. Musculoskeletal:        General: No deformity.  Neurological:     Mental Status: She is alert and oriented to person, place, and time.     Cranial Nerves: No dysarthria.     Coordination: Coordination normal.  Psychiatric:        Attention and Perception: Attention and perception normal. She does not perceive auditory or visual hallucinations.        Mood and Affect: Mood is anxious. Mood is not depressed. Affect is not labile, blunt, angry, tearful or inappropriate.        Speech: Speech normal.        Behavior: Behavior normal. Behavior is cooperative.        Thought Content: Thought content normal. Thought content is not paranoid or delusional. Thought content does not include homicidal or suicidal ideation. Thought content does not include homicidal or suicidal plan.        Cognition and Memory: Cognition and memory normal.        Judgment: Judgment normal.     Comments: Insight intact      Lab Review:     Component Value Date/Time   NA 134 03/26/2020 1056   K 3.9 03/26/2020 1056   CL 98 03/26/2020 1056   CO2 25 03/26/2020 1056   GLUCOSE 83 03/26/2020 1056   GLUCOSE 98 12/22/2019 1230   BUN 8 03/26/2020 1056   CREATININE 0.79 03/26/2020 1056   CALCIUM 9.4 03/26/2020 1056   PROT 7.6 12/21/2019 0949   ALBUMIN 4.3 12/21/2019 0949    AST 24 12/21/2019 0949   ALT 24 12/21/2019 0949   ALKPHOS 42 12/21/2019 0949   BILITOT 0.4 12/21/2019 0949   GFRNONAA 79 03/26/2020 1056   GFRAA 91  03/26/2020 1056       Component Value Date/Time   WBC 3.4 (L) 12/21/2019 0949   RBC 3.95 12/21/2019 0949   HGB 12.5 12/21/2019 0949   HCT 37.0 12/21/2019 0949   PLT 160 12/21/2019 0949   MCV 93.7 12/21/2019 0949   MCH 31.6 12/21/2019 0949   MCHC 33.8 12/21/2019 0949   RDW 12.6 12/21/2019 0949   LYMPHSABS 0.7 12/21/2019 0949   MONOABS 0.6 12/21/2019 0949   EOSABS 0.0 12/21/2019 0949   BASOSABS 0.0 12/21/2019 0949    Normal liver enzymes in September.  No results found for: POCLITH, LITHIUM   No results found for: PHENYTOIN, PHENOBARB, VALPROATE, CBMZ   .res Assessment: Plan:    Rochele was seen today for anxiety, follow-up and stress.  Diagnoses and all orders for this visit:  Generalized anxiety disorder  Panic disorder without agoraphobia  Gastroesophageal reflux disease without esophagitis  Insomnia due to mental condition  Migraine without aura and without status migrainosus, not intractable  Mild depression (HCC)  Greater than 50% of 30 min non face to face time with patient was spent on counseling and coordination of care. We discussed the following.  Much better with with anxiety on mirtazapine 7.5 mg 1 and 1/2 tablets HS .  Nausea, depression and anxiety Are all much better but not gone. No further weight gain since last appt.  Eating.  Cont Depakote for reflux.  Depakote ER BRAND helped GI problems which were worse on generic. HA still managed with Aimovig.  We discussed the short-term risks associated with benzodiazepines including sedation and increased fall risk among others.  Discussed long-term side effect risk including dependence, potential withdrawal symptoms, and the potential eventual dose-related risk of dementia. Disc newer studies that cast in doubt the relationship with dementia. Disc  warnings about combo of Bz and opiates not relevant at this low dose. Continue Xanax 0.25 mg BID for panic and GAD   No med changes  Consider Genesight testing bc med sensitivity and failures.  This was a 30-minute appointment  FU 4-6 mos  Lynder Parents, MD, DFAPA  Please see After Visit Summary for patient specific instructions.  Future Appointments  Date Time Provider [place]  10/20/2021 10:00 AM Debbora Presto, NP [facility] None  11/10/2021  2:00 PM Philemon Kingdom, MD [facility] None    No orders of the defined types were placed in this encounter.     -------------------------------

## 2020-11-18 NOTE — Telephone Encounter (Signed)
Called pt back. Advised AL,NP sent rx 10/20/20 22ml, 3 refills to [facility]. They should have this on file. She will f/u w/ pharmacy, nothing further needed.

## 2020-11-18 NOTE — Telephone Encounter (Signed)
Pt request refill  AIMOVIG 140 MG/ML SOAJ at [place] [phone]

## 2020-11-19 ENCOUNTER — Encounter: Payer: Self-pay | Admitting: Family Medicine

## 2020-11-22 NOTE — Telephone Encounter (Signed)
Called number provided by patient. Spoke w/ Gerald Stabs. She will re-fax tier exception denial to [phone]. This will include how to proceed w/ appeal process.  Appeals department phone # for [place] to follow up on appeal after we submit: [phone]. Urgent request reviewed in: 72hr, regular request: 7 days

## 2020-11-23 NOTE — Telephone Encounter (Signed)
Late entry from 11/22/20: "Appeal letter faxed to [employer] appeals at [phone]. Received fax confirmation. Marked urgent, waiting on determination.

## 2020-11-24 ENCOUNTER — Ambulatory Visit: Payer: 59 | Admitting: Family Medicine

## 2020-11-25 NOTE — Telephone Encounter (Signed)
Contacted her pharmacy to confirm medication needing prior authorization. Pharmacist ran it but nothing prompted it. Will try to submit on my end to see if anything comes up. No PA submitted previously.

## 2020-12-01 NOTE — Telephone Encounter (Signed)
Received letter regarding her denial of Depakote. Will submit PA with new insurance information.

## 2020-12-02 ENCOUNTER — Ambulatory Visit: Payer: 59 | Admitting: Internal Medicine

## 2020-12-03 NOTE — Telephone Encounter (Signed)
Prior Approval received for BRAND DEPAKOTE ER 250 mg effective through 05/14/2021, PA# [id number] with [employer] Medicare ID# [id number]

## 2020-12-20 ENCOUNTER — Encounter: Payer: Self-pay | Admitting: Family Medicine

## 2020-12-21 ENCOUNTER — Telehealth (INDEPENDENT_AMBULATORY_CARE_PROVIDER_SITE_OTHER): Payer: Medicare Other | Admitting: Family Medicine

## 2020-12-21 ENCOUNTER — Encounter: Payer: Self-pay | Admitting: Family Medicine

## 2020-12-21 DIAGNOSIS — G43001 Migraine without aura, not intractable, with status migrainosus: Secondary | ICD-10-CM | POA: Diagnosis not present

## 2020-12-21 NOTE — Progress Notes (Addendum)
PATIENT: Yolanda Carroll DOB: 1956/05/05  REASON FOR VISIT: follow up HISTORY FROM: patient  Virtual Visit via Telephone Note  I connected with Yolanda Carroll on 12/21/20 at 11:00 AM EDT by telephone and verified that I am speaking with the correct person using two identifiers.   I discussed the limitations, risks, security and privacy concerns of performing an evaluation and management service by telephone and the availability of in person appointments. I also discussed with the patient that there may be a patient responsible charge related to this service. The patient expressed understanding and agreed to proceed.   History of Present Illness:  12/21/20 Yolanda Carroll is a 65 y.o. female here today for follow up. She continues Amovig but reports that copay is too expensive. She has failed Ajovy. Discussed trial of Emgality at last visit, however, unsure if this will be affordable. She is doing very well on Amovig. She is not having any headache days. She has Nurtec but has not needed to take it.   She had posterior T4-ilium fusion on 7/19. She was discharged from rehab 12/17/2020. She is doing very well. She is doing home exercises and walking daily.   Meds tried: Amovig (on now), Depakote, Imitrex, baclofen, Nortriptyline, Gabapentin, Nefazodone, Skelaxin, ketaprofen  10/20/2020 ALL:  Yolanda Carroll is a 65 y.o. female here today for follow up for migraines. She continues Amovig and Nurtec. She reports that headaches have resolved. She has not had to take Nurtec. She can't remember when she last had a migraine. She tried to wean divalproex but had severe acid reflux. Psychiatry resumed divalproex. She is also taking Carafate. Since being diagnosed with Covid in 12/2019, she has had significant anxiety. She is being treated with alprazolam (dose was increased) and started on mirtazapine.    Observations/Objective:  Generalized: Well developed, in no acute distress   Mentation: Alert oriented to time, place, history taking. Follows all commands speech and language fluent   Assessment and Plan:  65 y.o. year old female  has a past medical history of Allergy, Arthritis, Cervical dysplasia, Endometriosis, Fatigue, GERD (gastroesophageal reflux disease), IBS (irritable bowel syndrome), Migraine, Osteoporosis (01/2019), Plantar fasciitis, PONV (postoperative nausea and vomiting), Recurrent vaginitis, Reflux, Scoliosis, and Wears glasses. here with    ICD-10-CM   1. Migraine without aura and with status migrainosus, not intractable  G43.001       Yolanda Carroll is doing very well on Amovig. Unfortunately, copay is about 250 per month. We have discussed other options of trying Emgality or [place] but cost is similar. Could consider Botox but unsure if we can get it covered without running benefits. She is not having any headache days. She prefers to discontinue Amovig at this time and monitor headaches. She will use Nurtec if needed for abortive therapy. We can resume Amovig if she chooses to pay copayment versus checking Botox benefits should migraines return. Healthy lifestyle habits encouraged. She will follow up in 1 year, sooner if needed. She verbalizes understanding and agreement with this plan.   No orders of the defined types were placed in this encounter.   No orders of the defined types were placed in this encounter.    Follow Up Instructions:  I discussed the assessment and treatment plan with the patient. The patient was provided an opportunity to ask questions and all were answered. The patient agreed with the plan and demonstrated an understanding of the instructions.   The patient was advised to call back or  seek an in-person evaluation if the symptoms worsen or if the condition fails to improve as anticipated.  I provided 15 minutes of non-face-to-face time during this encounter. Patient located at their place of residence during [place] visit.  Provider is in the office.    Debbora Presto, NP     agree with assessment and plan as stated.     Sarina Ill, MD [facility]

## 2020-12-21 NOTE — Patient Instructions (Signed)
Below is our plan:  We will top Amovig. Continue to monitor headaches. Use Nurtec '75mg'$  daily as needed for abortive therapy. We can restart Amovig or have our Botox coordinator run benefits for Botox therapy should migraines worsen in the future. Please call me if you need me!  Please make sure you are staying well hydrated. I recommend 50-60 ounces daily. Well balanced diet and regular exercise encouraged. Consistent sleep schedule with 6-8 hours recommended.   Please continue follow up with care team as directed.   Follow up with me in 1 year   You may receive a survey regarding today's visit. I encourage you to leave honest feed back as I do use this information to improve patient care. Thank you for seeing me today!

## 2020-12-30 ENCOUNTER — Telehealth: Payer: Self-pay | Admitting: *Deleted

## 2020-12-30 NOTE — Telephone Encounter (Signed)
Called pt. Advised Yolanda Carroll spoke w/ Dr. Jaynee Eagles who recommended she try and apply via [place]. Pt thinks she did not qualify before but she is going to fill out her portion of application and drop it off so we can turn it in with our portion to know for sure if she would qualify.

## 2021-01-13 ENCOUNTER — Telehealth: Payer: Self-pay | Admitting: Psychiatry

## 2021-01-13 NOTE — Telephone Encounter (Signed)
Pt lvm stating that she has changed insurance and she needs information sent to [employer] stating that the medication Divalproex and instead takes Depkote. Pls rtc [phone]

## 2021-01-13 NOTE — Telephone Encounter (Signed)
Yes that is correct and I  just did an approval for BRAND on 7/07 with [employer]. Will have to get more information about her insurance changes to get updated.

## 2021-01-13 NOTE — Telephone Encounter (Signed)
Please review

## 2021-01-13 NOTE — Telephone Encounter (Signed)
She needs PA stating reasons for BRAND Depakote vs generic with new insurance.  This has been documented in the chart and there have been prior PA's for this approved in the past.

## 2021-01-19 ENCOUNTER — Telehealth: Payer: Self-pay

## 2021-01-20 ENCOUNTER — Ambulatory Visit (INDEPENDENT_AMBULATORY_CARE_PROVIDER_SITE_OTHER): Payer: Medicare Other | Admitting: Sports Medicine

## 2021-01-20 ENCOUNTER — Other Ambulatory Visit: Payer: Self-pay

## 2021-01-20 VITALS — Ht 67.0 in | Wt 122.0 lb

## 2021-01-20 DIAGNOSIS — M7742 Metatarsalgia, left foot: Secondary | ICD-10-CM | POA: Insufficient documentation

## 2021-01-20 NOTE — Assessment & Plan Note (Signed)
Her pain is most likely secondary to metatarsalgia.  She was fitted for a small metatarsal pad on the left and was able to ambulate without pain.  Her gait was neutral on examination after pad placement.  Her orthotics that are 65 years old are still in good shape.  She can follow-up as needed.

## 2021-01-20 NOTE — Telephone Encounter (Signed)
Tried submitting a  request for BRAND Depakote with new information given for insurance and response back was :  This medication was previously approved on [id number] from 2020-12-02 to 2022-12 again with [employer]

## 2021-01-20 NOTE — Progress Notes (Signed)
   Yolanda Carroll is a 65 y.o. female who presents to [facility] today for the following:  Left Foot Pain Prior orthotics made by Dr. Oneida Alar 7 yrs ago Has done well until the last few weeks when started to have pain in left plantar ball of foot just below 2nd digit No injury Walks for exercise and has pain with pressure Has been wearing slippers in the house because of her recent surgery and wonders if this is contributing Prior hx plantar fasciitis, but not having pain similar to this  She reports that in July she had corrective surgery for her scoliosis by Dr. Maudie Carroll in [place] and is doing well from this standpoint   PMH reviewed.  ROS as above. Medications reviewed.  Exam:  Ht '5\' 7"'$  (1.702 m)   Wt 122 lb (55.3 kg)   BMI 19.11 kg/m  Gen: Well NAD MSK:  Left Foot: Inspection:  Collapse of transverse arch on left, short 5th toe s/p bone resection.  No swelling, erythema, or bruising b/l.  Normal arch b/l Palpation: TTP at 2nd MT head, none on shaft or in interdigital space ROM: Full  ROM of the ankle b/l. Normal midfoot flexibility b/l Strength: 5/5 strength ankle in all planes b/l Neurovascular: N/V intact distally in the lower extremity b/l Unable to perform hop test due to recent back surgery   No results found.   Assessment and Plan: 1) Metatarsalgia of left foot Her pain is most likely secondary to metatarsalgia.  She was fitted for a small metatarsal pad on the left and was able to ambulate without pain.  Her gait was neutral on examination after pad placement.  Her orthotics that are 65 years old are still in good shape.  She can follow-up as needed.  2) Status post spinal surgery by Dr. Maudie Carroll to correct scoliosis-doing very well.  She will follow-up with him per his request.  Yolanda Carroll, D.O.  PGY-4 [facility]  01/20/2021 4:08 PM   Patient seen and evaluated with the sports medicine fellow.  I agree with the above plan of care.  [name] has  metatarsalgia.  Hopefully the addition of a simple metatarsal pad will help alleviate her pain.  She was concerned about the pad leading to some returning Planter fasciitis.  I reassured her that that would be unusual but I also instructed her to remove the pad if she begins to have any arch pain.

## 2021-01-25 NOTE — Telephone Encounter (Signed)
Aimovig approval letter (authorized through 05/14/21) faxed to [employer] @ [phone]. Received a receipt of confirmation.

## 2021-01-27 ENCOUNTER — Other Ambulatory Visit: Payer: Self-pay | Admitting: Obstetrics & Gynecology

## 2021-01-27 DIAGNOSIS — Z1231 Encounter for screening mammogram for malignant neoplasm of breast: Secondary | ICD-10-CM

## 2021-02-15 ENCOUNTER — Ambulatory Visit: Payer: PRIVATE HEALTH INSURANCE | Admitting: Sports Medicine

## 2021-02-17 ENCOUNTER — Ambulatory Visit: Payer: PRIVATE HEALTH INSURANCE | Admitting: Sports Medicine

## 2021-02-22 ENCOUNTER — Ambulatory Visit: Payer: PRIVATE HEALTH INSURANCE | Admitting: Internal Medicine

## 2021-03-02 ENCOUNTER — Other Ambulatory Visit: Payer: Self-pay | Admitting: Psychiatry

## 2021-03-02 DIAGNOSIS — F410 Panic disorder [episodic paroxysmal anxiety] without agoraphobia: Secondary | ICD-10-CM

## 2021-03-02 NOTE — Telephone Encounter (Signed)
Last filled 11/24/20 appt on 12/13

## 2021-03-07 ENCOUNTER — Ambulatory Visit
Admission: RE | Admit: 2021-03-07 | Discharge: 2021-03-07 | Disposition: A | Discharge status: Home / Self Care | Payer: Medicare Other | Source: Ambulatory Visit | Attending: Obstetrics & Gynecology | Admitting: Obstetrics & Gynecology

## 2021-03-07 ENCOUNTER — Other Ambulatory Visit: Payer: Self-pay

## 2021-03-07 DIAGNOSIS — Z1231 Encounter for screening mammogram for malignant neoplasm of breast: Secondary | ICD-10-CM

## 2021-03-16 NOTE — Telephone Encounter (Signed)
Error

## 2021-04-21 ENCOUNTER — Telehealth: Payer: Self-pay | Admitting: *Deleted

## 2021-04-21 NOTE — Telephone Encounter (Signed)
Submitted PA Aimovig on CMM. Key: [id number]. Waiting on determination from [employer].

## 2021-04-21 NOTE — Telephone Encounter (Signed)
Request Reference Number: [id number]. AIMOVIG INJ 140MG /ML is approved through 05/14/2022. Your patient may now fill this prescription and it will be covered.

## 2021-04-26 ENCOUNTER — Other Ambulatory Visit: Payer: Self-pay

## 2021-04-26 ENCOUNTER — Encounter: Payer: Self-pay | Admitting: Psychiatry

## 2021-04-26 ENCOUNTER — Ambulatory Visit (INDEPENDENT_AMBULATORY_CARE_PROVIDER_SITE_OTHER): Payer: Medicare Other | Admitting: Psychiatry

## 2021-04-26 DIAGNOSIS — K219 Gastro-esophageal reflux disease without esophagitis: Secondary | ICD-10-CM | POA: Diagnosis not present

## 2021-04-26 DIAGNOSIS — F411 Generalized anxiety disorder: Secondary | ICD-10-CM | POA: Diagnosis not present

## 2021-04-26 DIAGNOSIS — G43009 Migraine without aura, not intractable, without status migrainosus: Secondary | ICD-10-CM | POA: Diagnosis not present

## 2021-04-26 DIAGNOSIS — F410 Panic disorder [episodic paroxysmal anxiety] without agoraphobia: Secondary | ICD-10-CM

## 2021-04-26 DIAGNOSIS — F32A Depression, unspecified: Secondary | ICD-10-CM

## 2021-04-26 DIAGNOSIS — F5105 Insomnia due to other mental disorder: Secondary | ICD-10-CM

## 2021-04-26 MED ORDER — ALPRAZOLAM 0.25 MG PO TABS: 0.2500 mg | ORAL_TABLET | Freq: Two times a day (BID) | ORAL | 1 refills | Status: DC

## 2021-04-26 MED ORDER — DEPAKOTE ER 250 MG PO TB24: 250.0000 mg | ORAL_TABLET | Freq: Every day | ORAL | 3 refills | Status: DC

## 2021-04-26 MED ORDER — MIRTAZAPINE 7.5 MG PO TABS: ORAL_TABLET | ORAL | 3 refills | Status: DC

## 2021-04-26 NOTE — Progress Notes (Signed)
Yolanda Carroll [id number] 06-Apr-1956 65 y.o.   Subjective:   Patient ID:  Yolanda Carroll is a 65 y.o. (DOB 01/18/1956) female.  Chief Complaint:  Chief Complaint  Patient presents with   Follow-up   Anxiety   Headache    Anxiety Patient reports no confusion, decreased concentration, nausea, nervous/anxious behavior or suicidal ideas.    Yolanda Carroll presents to the office today for follow-up of anxiety and migraine.  seen in July 2020.  No meds were changed.  She requires name brand Depakote because the generic caused worsening headaches.  Retired November 14, 2018.  Was so burned out at work.  Helping with church projects and helping friends.  Focusing on her health and doing PT for back and hip.  No kids or gkids.  Helps care for 59 yo M-in-law.  H is also busy which helps.  Given a surprise retirement party.     seen January 2021.  Nefazodone is no longer manufactured and she had to wean off.  We also discussed the potential of weaning off Depakote because Aimovig had been very effective at managing her migraine headache.  She called August 11, 2019 stating that she did taper off of Depakote because she did not feel like she needed it any more.  Her last dose was July 23, 2019 but afterwards noticed heartburn and swallowing issues.  Her GI doctor added Pepcid 40 mg daily.  Her neurologist had indicated that Depakote can sometimes help with esophageal issues and she wondered if that was connected.  She was given the option to restart a lower dose and perhaps taper more slowly.  Off Depakote for a month.  She elected not to restart it.  08/19/2019 appointment the following is noted: Tinnitus and mild anxiety worse off the Depakote and with some mind racing esp in the AM.   Most is better. Reflux is worse off Depakote ER 250 and wondering if she should restart it.   Disc article on Depakote helping GERD by increasing lower esophageal sphincter tone. Has travelled some with family  events.  M-in-law 33 soon.   HA good with Aimovig. Don't do season changes well and more HA in Spring often. Sleep not great lately with leg cramps.  Started more exercise.  Started snoring and H is a light sleeper.  Meds help. Plan no med changes.  10/03/2019 phone call with patient asking to restart residual nefazodone that she has on hand.  She felt that it helped back pain and she is experiencing more back pain recently.  She is aware that it is no longer manufactured to our knowledge and once she runs out it will not be available.  She indicated she had an off to take half of 150 mg nefazodone tablet for about 2 to 3 months and would like to do so.  11/05/2019 appointment with the following noted:  Appt moved earlier DT the following. Had stopped Depakote and nefazadone and reflux got worse.  Restarting Depakote didn't help much.  Restarted nefazodaone and reflux better right away.  May not need Depakote but doesn't want to change.  Can't exercise DT back pain since Jan ans worse since March.  Getting new doctor.  More depressed and anxious and sleep problems DT back pain.   Usually only 0.25 mg alprazolam at night. Plan: has to stop nefazodone when it runs out bc no longer manufactured.  Therefore will try trazodone in it's place.  01/07/20 appt with the following noted: She  and H both got Covid.  Was pretty sick for 10 days.  Lost taste.  Had little resp stuff but severe diarrhea and nausea and couldn't keep fluids down.  No residual sx. H had resp sx with pneumonia and had to get O2.  Hosp for 3 days. Still have some nefazodone and nursing it along. Tried trazodone 50 HS for 2 nights and didn't sleep well and had a HA  Nefazodone and Depakote both helped the GERD.  She has enough nefazadone for about 6 weeks. Plan: Nefazodone helped the reflux and she wants to take it as long as possible.  She didn't have withdrawal off nefazodone. When she runs out then start trazodone.  If that fails then  use Viibryd.  Disc SE and alternatives.   Rec try trazodone again but use 100 mg and see if sleeping better prevents the AM HA.  If HA recurs then viibryd. Starter kit.  Depakote didn't help as much as expected and she might stop it.   02/18/2020 phone call: Yolanda Carroll called to report that she is experiencing high anxiety.  It is causing chest discomfort and breathing problems. Has appt 11/18 and is on the wait list but needs to discuss how to relief this anxiety.  Please call. Response: Note Rtc to patient, she does have the Viibryd and does remember that discussion. She has not been able to start that because she received a steroid injection a couple weeks ago and caused her stomach to be upset. She didn't want to start it knowing that's a side effect. She feels like the steroid injection may have worsened all her symptoms. She did try the trazodone but it was too much for her, even taking a 1/2 tablet. She does have some serzone left and has been taking that until this improves. Also taking Xanax as needed during day if needed. Hoping all of this will improve soon. Informed her I would update Dr. Clovis Pu with her information.        04/01/2020 appointment with the following noted: Out of nefazodone a few weeks ago.  More reflux off it.  Tried trazodone a couple of times.  Tried 100 mg trazodone and heart skipped.  50 mg awoke with chest tightness.  Long haul Covid with GI sx.  Nausea and anxiety problems daily since mid September.  Covid early August and got monoclonal infusion which helped.  Headed to long haul Covid clinic in [place] Monday.   Needed to increase alprazolam to BID.  And 80% better with that.  Panic some out of bed.  Sx started after steroid shot for her back 9/21.   Hasn't tried Viibryd DT fear of GI worsening.  Poor appetite and lost 15#. Not taken tramadol yet. Migraine under control. Plan: no med changes  06/01/20 appt with following noted: Starting generic Depakote ER generic  today. Started Mirtazapine 3.75 mg and it worked right away for nausea for a week.  Then increased to 7.5 mg HS.  Most days is feeling really well.  No SE except mild drowsiness. Still has post Covid problems with stomach but much better.  Regained 7# of needed weight.  Need to try to gain 10# more. Zofran failed. Had a lot anxiety with N but it's better now.  Was waking with panic and no longer N and anxiety better too. Severe scoliosis facing big surgery in summer with long recovery.  Can't ride in car for long. Plan: No med changes  08/18/2020 appointment with the following  noted: Gained 12# and thankful for mirtazapine. Long Covid GI sx.  Better not gone with mirtazapine. Rare AM panic.  Sleep good. Scoliosis and may require surgery. Can't sit or stand for long Depakote brand ER 250 helps heartburn. Generic failed. Patient denies difficulty with sleep initiation with Xanax.. Denies appetite disturbance.  Patient reports that energy and motivation have been good.  Patient denies any difficulty with concentration.  Patient denies any suicidal ideation. More depressed and tearful with pain but varies with pain levels.  Good and bad days.  Not too depressed with anxiety worse. Plan: Continue mirtazapine 7.5 mg with Xanax for GI problems including nausea and appetite.  It has worked some.  Try higher dose. Insomnia managed with  With alprazolam.    11/18/2020 appointment with the following noted: Increased mirtazapine to 7.5 mg 1 and 1/2 tablet bc  less anxious with it. Facing big back surgery for severe scoliosis 11/30/20.  Can't drive in car for distance or travel. Now on Medicare. Depakote ER BRAND helped GI problems which were worse on generic. Not markedly depressed.  Patient reports stable mood and denies depressed or irritable moods.   Patient denies difficulty with sleep initiation or maintenance. Denies appetite disturbance.  Patient reports that energy and motivation have been good.   Patient denies any difficulty with concentration.  Patient denies any suicidal ideation. Plan: No med changes Continue Depakote brand ER 250 mg daily, continue alprazolam 0.25 mg nightly, continue mirtazapine 7.5 mg nightly  04/26/2021 appointment with the following noted: Still on hydrocodone from surgery on back in July.  Driving.  Healing slowly. Pain before surgery is gone now.  Now pain related to adjusting to rod. But surgery was successful. Fritz Pickerel has been helpful. Has reduced mirtazapine 3.75 mg HS to try to come off it.  Sleep a little worse if doesn't take it.  Stomach problems are better.   No HA in a year but started having migraines in a year.  Migraines started after change in logo of brand Depakote so restarted Aimovig.  Depakote also helped acid reflux. Mirtazapine helped sleep and appetite.  Weight is good.  Sleep is good with it still. Mood is OK. Active at church and people are supportive.  Saw neurologist for Migraine and had problems with the meds RX.  Aimovig has helped..    Past Psychiatric Medication Trials: Failed multiple other antidepressants,  nefazodone & Depakote for migraine,  Depakote brand ER 250 helps heartburn. Generic failed. Mirtazapine 7.5 mg benefit citalopram palpitations Trazodone Clonazepam did not help anxiety, alprazolam helps anxiety and sleep  Review of Systems:  Review of Systems  Gastrointestinal:  Negative for abdominal pain and nausea.  Musculoskeletal:  Positive for arthralgias and back pain.  Neurological:  Negative for tremors, weakness and headaches.  Psychiatric/Behavioral:  Negative for agitation, behavioral problems, confusion, decreased concentration, dysphoric mood, hallucinations, self-injury, sleep disturbance and suicidal ideas. The patient is not nervous/anxious and is not hyperactive.    Medications: I have reviewed the patient's current medications.  Current Outpatient Medications  Medication Sig Dispense Refill    Ascorbic Acid (VITAMIN C PO) Take 1 tablet by mouth daily. 549m capsule     Bacillus Coagulans-Inulin (PROBIOTIC) 1-250 BILLION-MG CAPS      Calcium Carbonate-Vit D-Min (CALCIUM 1200 PO) Take by mouth daily.     denosumab (PROLIA) 60 MG/ML SOSY injection Inject 60 mg into the skin every 6 (six) months.     gabapentin (NEURONTIN) 100 MG capsule Take 200 mg by  mouth daily.     HYDROcodone-acetaminophen (NORCO) 7.5-325 MG tablet Take 1 tablet by mouth every 6 (six) hours as needed for moderate pain.     lansoprazole (PREVACID) 30 MG capsule Take 30 mg by mouth 2 (two) times daily before a meal.     lidocaine (LIDODERM) 5 % Place 1 patch onto the skin as needed. Back pain     methocarbamol (ROBAXIN) 500 MG tablet Take 500 mg by mouth 4 (four) times daily.     Multiple Vitamin (MULTIVITAMIN) tablet Take 1 tablet by mouth daily.     Rimegepant Sulfate (NURTEC) 75 MG TBDP Take 75 mg by mouth daily as needed. For migraines. Take as close to onset of migraine as possible. One daily maximum. 4 tablet 0   valACYclovir (VALTREX) 500 MG tablet Take 500 mg by mouth 2 (two) times daily as needed (oral herpes).      vitamin E 180 MG (400 UNITS) capsule Take 400 Units by mouth daily.     AIMOVIG 140 MG/ML SOAJ INJECT 140 MG INTO SKIN EVERY 30 DAYS 3 mL 3   ALPRAZolam (XANAX) 0.25 MG tablet Take 1 tablet (0.25 mg total) by mouth 2 (two) times daily. 180 tablet 1   baclofen (LIORESAL) 10 MG tablet Take 10 mg by mouth as needed for muscle spasms.      conjugated estrogens (PREMARIN) vaginal cream Place 1 Applicatorful vaginally 2 (two) times a week. (Patient not taking: Reported on 04/26/2021) 42.5 g 5   DEPAKOTE ER 250 MG 24 hr tablet Take 1 tablet (250 mg total) by mouth daily. 90 tablet 3   FAMOTIDINE PO Take 20 mg by mouth daily.  (Patient not taking: Reported on 04/26/2021)     L-LYSINE PO Take 1 tablet by mouth daily.  (Patient not taking: Reported on 04/26/2021)     Lactobacillus (ACIDOPHILUS) 100 MG CAPS  Take 100 mg by mouth daily. (Patient not taking: Reported on 04/26/2021)     meclizine (ANTIVERT) 25 MG tablet Take 25 mg by mouth 3 (three) times daily as needed for dizziness. (Patient not taking: Reported on 04/26/2021)     mirtazapine (REMERON) 7.5 MG tablet TAKE ONE AND ONE HALF TABLET BY MOUTH NIGHTLY 135 tablet 3   No current facility-administered medications for this visit.    Medication Side Effects: None  Allergies:  Allergies  Allergen Reactions   Penicillins Other (See Comments)    Seizure as a child   Atarax [Hydroxyzine] Other (See Comments)    Headache, depression   Celexa [Citalopram Hydrobromide] Other (See Comments)    Chest pain   Erythromycin      Upset stomache   Flagyl [Metronidazole]     Dizzy and increased heart rate   Nortriptyline Itching   Prilosec [Omeprazole]     Abdominal pain   Requip [Ropinirole]     Made sx worse   Tramadol Itching   Darvocet [Propoxyphene N-Acetaminophen] Itching   Percocet [Oxycodone-Acetaminophen] Itching    Past Medical History:  Diagnosis Date   Allergy    Arthritis    Cervical dysplasia    Endometriosis    Fatigue    GERD (gastroesophageal reflux disease)    IBS (irritable bowel syndrome)    Migraine    Osteoporosis 01/2019   T score -3.1   Plantar fasciitis    PONV (postoperative nausea and vomiting)    also difficult to wake up   Recurrent vaginitis    Reflux    Scoliosis    Wears  glasses     Family History  Problem Relation Age of Onset   Cancer Father        lymphoma   Other Mother        bipolar,reflux   Bipolar disorder Mother    Other Brother        sinus problems   Cancer Maternal Aunt        uterine cancer   Breast cancer Maternal Aunt        40's   Diabetes Maternal Aunt    Cancer Paternal Aunt        Colon cancer   Breast cancer Cousin 63       Mat. 1st cousin    Social History   Socioeconomic History   Marital status: Married    Spouse name: Fritz Pickerel   Number of children: 0    Years of education: 16   Highest education level: Not on file  Occupational History    Comment: [employer]  Tobacco Use   Smoking status: Former    Types: Cigarettes    Quit date: 05/17/1983    Years since quitting: 37.9   Smokeless tobacco: Never  Vaping Use   Vaping Use: Never used  Substance and Sexual Activity   Alcohol use: Yes    Alcohol/week: 1.0 - 2.0 standard drink    Types: 1 - 2 Standard drinks or equivalent per week    Comment: socially, occasional   Drug use: No   Sexual activity: Yes    Birth control/protection: Post-menopausal    Comment: intercourse age 49 , sexual partners more than 5  Other Topics Concern   Not on file  Social History Narrative   Pt is married, no children.  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    Caffeine- very little.   Right handed   Lives at home with her husband   Social Determinants of Health   Financial Resource Strain: Not on file  Food Insecurity: Not on file  Transportation Needs: Not on file  Physical Activity: Not on file  Stress: Not on file  Social Connections: Not on file  Intimate Partner Violence: Not on file    Past Medical History, Surgical history, Social history, and Family history were reviewed and updated as appropriate.   Please see review of systems for further details on the patient's review from today.   Objective:   Physical Exam:  There were no vitals taken for this visit.  Physical Exam Constitutional:      General: She is not in acute distress. Musculoskeletal:        General: No deformity.  Neurological:     Mental Status: She is alert and oriented to person, place, and time.     Cranial Nerves: No dysarthria.     Coordination: Coordination normal.  Psychiatric:        Attention and Perception: Attention and perception normal. She does not perceive auditory or visual hallucinations.        Mood and Affect: Mood is anxious. Mood is not depressed.  Affect is not labile, blunt, angry, tearful or inappropriate.        Speech: Speech normal.        Behavior: Behavior normal. Behavior is cooperative.        Thought Content: Thought content normal. Thought content is not paranoid or delusional. Thought content does not include homicidal or suicidal ideation.        Cognition and Memory: Cognition and memory  normal.        Judgment: Judgment normal.     Comments: Insight intact      Lab Review:     Component Value Date/Time   NA 134 03/26/2020 1056   K 3.9 03/26/2020 1056   CL 98 03/26/2020 1056   CO2 25 03/26/2020 1056   GLUCOSE 83 03/26/2020 1056   GLUCOSE 98 12/22/2019 1230   BUN 8 03/26/2020 1056   CREATININE 0.79 03/26/2020 1056   CALCIUM 9.4 03/26/2020 1056   PROT 7.6 12/21/2019 0949   ALBUMIN 4.3 12/21/2019 0949   AST 24 12/21/2019 0949   ALT 24 12/21/2019 0949   ALKPHOS 42 12/21/2019 0949   BILITOT 0.4 12/21/2019 0949   GFRNONAA 79 03/26/2020 1056   GFRAA 91 03/26/2020 1056       Component Value Date/Time   WBC 3.4 (L) 12/21/2019 0949   RBC 3.95 12/21/2019 0949   HGB 12.5 12/21/2019 0949   HCT 37.0 12/21/2019 0949   PLT 160 12/21/2019 0949   MCV 93.7 12/21/2019 0949   MCH 31.6 12/21/2019 0949   MCHC 33.8 12/21/2019 0949   RDW 12.6 12/21/2019 0949   LYMPHSABS 0.7 12/21/2019 0949   MONOABS 0.6 12/21/2019 0949   EOSABS 0.0 12/21/2019 0949   BASOSABS 0.0 12/21/2019 0949    Normal liver enzymes in September.  No results found for: POCLITH, LITHIUM   No results found for: PHENYTOIN, PHENOBARB, VALPROATE, CBMZ   .res Assessment: Plan:    Garry was seen today for follow-up, anxiety and headache.  Diagnoses and all orders for this visit:  Generalized anxiety disorder -     mirtazapine (REMERON) 7.5 MG tablet; TAKE ONE AND ONE HALF TABLET BY MOUTH NIGHTLY  Panic disorder without agoraphobia -     ALPRAZolam (XANAX) 0.25 MG tablet; Take 1 tablet (0.25 mg total) by mouth 2 (two) times daily. -      mirtazapine (REMERON) 7.5 MG tablet; TAKE ONE AND ONE HALF TABLET BY MOUTH NIGHTLY  Gastroesophageal reflux disease without esophagitis -     DEPAKOTE ER 250 MG 24 hr tablet; Take 1 tablet (250 mg total) by mouth daily.  Migraine without aura and without status migrainosus, not intractable  Insomnia due to mental condition -     mirtazapine (REMERON) 7.5 MG tablet; TAKE ONE AND ONE HALF TABLET BY MOUTH NIGHTLY  Mild depression -     mirtazapine (REMERON) 7.5 MG tablet; TAKE ONE AND ONE HALF TABLET BY MOUTH NIGHTLY  Greater than 50% of 30 min non face to face time with patient was spent on counseling and coordination of care. We discussed the following.  Much better with with anxiety, appetite and sleep on mirtazapine 7.5 mg  1/2-1 tablets HS .  Nausea, depression and anxiety Are all gone. No further weight gain since last appt.  Eating.  Cont Depakote for reflux.  Depakote ER BRAND helped GI problems which were worse on generic. HA worse off Aimovig so restarted.  We discussed the short-term risks associated with benzodiazepines including sedation and increased fall risk among others.  Discussed long-term side effect risk including dependence, potential withdrawal symptoms, and the potential eventual dose-related risk of dementia. Disc newer studies that cast in doubt the relationship with dementia. Disc warnings about combo of Bz and opiates not relevant at this low dose. Continue Xanax 0.25 mg BID for panic and GAD   No med changes  Consider Genesight testing bc med sensitivity and failures.  This was a 30-minute appointment  FU 4-6 mos  Lynder Parents, MD, DFAPA  Please see After Visit Summary for patient specific instructions.  Future Appointments  Date Time Provider Mobile  05/31/2021 10:00 AM [facility] NURSE [facility] None  10/17/2021  2:00 PM Philemon Kingdom, MD [facility] None  10/20/2021 10:00 AM Lomax, Amy, NP [facility] None    No orders of the defined  types were placed in this encounter.     -------------------------------

## 2021-05-24 ENCOUNTER — Ambulatory Visit: Payer: PRIVATE HEALTH INSURANCE

## 2021-05-30 NOTE — Telephone Encounter (Addendum)
Pt ready for scheduling on or after 05/20/21 ° °Out-of-pocket cost due at time of visit: $226 ° °Primary: Medicare °Prolia co-insurance: 20% (approximately $276) °Admin fee co-insurance: 20% (approximately $25) ° °Secondary: A[employer] °Prolia co-insurance: covers Medicare co-insurance °Admin fee co-insurance: covers Medicare co-insurance ° °Deductible: $0 of $226 met (as of 05/15/21). Secondary does not cover deductible.  ° °Prior Auth: not required °PA# °Valid:  ° °** This summary of benefits is an estimation of the patient's out-of-pocket cost. Exact cost may vary based on individual plan coverage.  ° ° °

## 2021-05-31 ENCOUNTER — Telehealth: Payer: Self-pay | Admitting: Family Medicine

## 2021-05-31 ENCOUNTER — Encounter: Payer: Self-pay | Admitting: Family Medicine

## 2021-05-31 ENCOUNTER — Ambulatory Visit (INDEPENDENT_AMBULATORY_CARE_PROVIDER_SITE_OTHER): Payer: Medicare Other

## 2021-05-31 ENCOUNTER — Other Ambulatory Visit: Payer: Self-pay

## 2021-05-31 DIAGNOSIS — M810 Age-related osteoporosis without current pathological fracture: Secondary | ICD-10-CM | POA: Diagnosis not present

## 2021-05-31 MED ORDER — DENOSUMAB 60 MG/ML ~~LOC~~ SOSY
60.0000 mg | PREFILLED_SYRINGE | Freq: Once | SUBCUTANEOUS | Status: AC
  Administered 2021-05-31: 60 mg via SUBCUTANEOUS

## 2021-05-31 NOTE — Telephone Encounter (Signed)
Pt is completing her part of the paperwork. She will bring everything to the office for [place] to resubmit

## 2021-05-31 NOTE — Telephone Encounter (Signed)
Pt is asking for a call to discuss assistance in completing Aimovig paperwork for discount on cost.

## 2021-05-31 NOTE — Progress Notes (Signed)
Prolia injection administered left arm sub q. Patient tolerated well

## 2021-06-29 NOTE — Telephone Encounter (Signed)
Received fax from [employer] that pt enrollment status pending.

## 2021-07-06 NOTE — Telephone Encounter (Signed)
Last Prolia inj 05/31/21 Next Prolia nj due 11/29/21

## 2021-07-12 ENCOUNTER — Ambulatory Visit (INDEPENDENT_AMBULATORY_CARE_PROVIDER_SITE_OTHER): Payer: Medicare Other | Admitting: Sports Medicine

## 2021-07-12 ENCOUNTER — Ambulatory Visit: Payer: PRIVATE HEALTH INSURANCE | Admitting: Sports Medicine

## 2021-07-12 VITALS — BP 96/39 | Ht 68.0 in | Wt 122.0 lb

## 2021-07-12 DIAGNOSIS — M25559 Pain in unspecified hip: Secondary | ICD-10-CM | POA: Diagnosis present

## 2021-07-12 DIAGNOSIS — R200 Anesthesia of skin: Secondary | ICD-10-CM | POA: Diagnosis not present

## 2021-07-12 NOTE — Patient Instructions (Signed)
It was great to see you today!   We think your hip pain is caused by proximal hamstring tendinopathy. You will continue to work with [facility] but they'll add in exercises to address this issue specifically. Please follow up in 4 weeks. Call in the meantime with any questions.

## 2021-07-12 NOTE — Progress Notes (Signed)
° °  Subjective:    Patient ID: Yolanda Carroll, female    DOB: [birth year], 66 y.o.   MRN: [id number]  HPI chief complaint: Left toe numbness and right hip pain  Yolanda Carroll presents today with a couple of different complaints.  She is complaining of intermittent left great toe numbness has been occurring over the past 6 weeks.  She was seen in our office in September and diagnosed with metatarsalgia of the left foot.  We placed a metatarsal pad on her custom orthotics and that has helped with her metatarsal pain.  She purchased new shoes about 2 months ago but does not recall any direct correlation between wearing those shoes and her toe numbness.  Numbness is localized primarily at the IP joint distally.  She denies numbness elsewhere in the foot.  It is not painful.  She saw her spine surgeon recently and he does not believe that her numbness is secondary to her back. She is also complaining of some posterior right hip pain.  No trauma.  Is most noticeable when first getting up from a seated position.  It does seem to improve as she ambulates.  She is been told in the past that she has arthritis in this hip and has had cortisone injections for those in the past.  She denies any groin pain currently however.  She did recently get a greater trochanteric injection for presumed greater trochanteric bursitis.  That has not been very beneficial.  Interim medical history reviewed Medications reviewed.  Of note, she does take gabapentin 200 mg at night. Allergies reviewed    Review of Systems As above    Objective:   Physical Exam  Well-developed, well-nourished.  No acute distress  Right hip: There is limited internal rotation passively but this does not cause pain.  Full external rotation.  She is tender to palpation at the origin of the common hamstring tendon.  No tenderness to palpation over the greater trochanter.  Left foot: Examination of the left foot with attention to the left great toe shows  good strength.  No tenderness to palpation.  There is some numbness to the light touch at the IP joint distally.  Normal skin color.      Assessment & Plan:   Posterior right hip pain secondary to proximal hamstring tendinopathy Left toe numbness of questionable etiology Status post extensive spine surgery for scoliosis  Sol continues to work with a physical therapist twice a week on chronic low back weakness.  I have asked the physical therapist to add some hip strengthening exercises focusing on the proximal hamstring to help address weakness here as well.  As far as the left toe numbness, I question whether or not her new shoes in combination with her custom orthotics are placing too much pressure in the forefoot.  We will have her simply skip the first set of eyelets closest to the forefoot when lacing her shoes.  Follow-up with me again in 4 weeks for reevaluation of both of these issues.  This note was dictated using Dragon naturally speaking software and may contain errors in syntax, spelling, or content which have not been identified prior to signing this note.

## 2021-08-03 ENCOUNTER — Ambulatory Visit (INDEPENDENT_AMBULATORY_CARE_PROVIDER_SITE_OTHER): Payer: Medicare Other | Admitting: Psychiatry

## 2021-08-03 ENCOUNTER — Encounter: Payer: Self-pay | Admitting: Psychiatry

## 2021-08-03 ENCOUNTER — Other Ambulatory Visit: Payer: Self-pay

## 2021-08-03 DIAGNOSIS — F410 Panic disorder [episodic paroxysmal anxiety] without agoraphobia: Secondary | ICD-10-CM

## 2021-08-03 DIAGNOSIS — G43009 Migraine without aura, not intractable, without status migrainosus: Secondary | ICD-10-CM

## 2021-08-03 DIAGNOSIS — F411 Generalized anxiety disorder: Secondary | ICD-10-CM | POA: Diagnosis not present

## 2021-08-03 DIAGNOSIS — F331 Major depressive disorder, recurrent, moderate: Secondary | ICD-10-CM

## 2021-08-03 DIAGNOSIS — F5105 Insomnia due to other mental disorder: Secondary | ICD-10-CM | POA: Diagnosis not present

## 2021-08-03 MED ORDER — ALPRAZOLAM 0.25 MG PO TABS: 0.2500 mg | ORAL_TABLET | Freq: Three times a day (TID) | ORAL | 0 refills | Status: DC | PRN

## 2021-08-03 NOTE — Progress Notes (Signed)
Yolanda Carroll ?[id number] ?04-06-1956 ?66 y.o. ? ? ?Subjective:  ? ?Patient ID:  Yolanda Carroll is a 66 y.o. (DOB [birth year]) female. ? ?Chief Complaint:  ?Chief Complaint  ?Patient presents with  ? Follow-up  ? Generalized anxiety disorder  ? Migraine without aura and without status migrainosus, not i  ? ? ?Anxiety ?Symptoms include nausea and nervous/anxious behavior. Patient reports no confusion, decreased concentration or suicidal ideas.  ? ? ?Yolanda Carroll presents to the office today for follow-up of anxiety and migraine. ? ?seen in July 2020.  No meds were changed.  She requires name brand Depakote because the generic caused worsening headaches. ? ?Retired November 14, 2018.  Was so burned out at work.  Helping with church projects and helping friends.  Focusing on her health and doing PT for back and hip.  No kids or gkids.  Helps care for 69 yo M-in-law.  H is also busy which helps.  Given a surprise retirement party.    ? ?seen January 2021.  Nefazodone is no longer manufactured and she had to wean off.  We also discussed the potential of weaning off Depakote because Aimovig had been very effective at managing her migraine headache. ? ?She called August 11, 2019 stating that she did taper off of Depakote because she did not feel like she needed it any more.  Her last dose was July 23, 2019 but afterwards noticed heartburn and swallowing issues.  Her GI doctor added Pepcid 40 mg daily.  Her neurologist had indicated that Depakote can sometimes help with esophageal issues and she wondered if that was connected.  She was given the option to restart a lower dose and perhaps taper more slowly.  Off Depakote for a month.  She elected not to restart it. ? ?08/19/2019 appointment the following is noted: ?Tinnitus and mild anxiety worse off the Depakote and with some mind racing esp in the AM.   Most is better. Reflux is worse off Depakote ER 250 and wondering if she should restart it.   ?Disc article on Depakote  helping GERD by increasing lower esophageal sphincter tone. ?Has travelled some with family events.  M-in-law 24 soon.   ?HA good with Aimovig. ?Don't do season changes well and more HA in Spring often. ?Sleep not great lately with leg cramps.  Started more exercise.  Started snoring and H is a light sleeper.  Meds help. ?Plan no med changes. ? ?10/03/2019 phone call with patient asking to restart residual nefazodone that she has on hand.  She felt that it helped back pain and she is experiencing more back pain recently.  She is aware that it is no longer manufactured to our knowledge and once she runs out it will not be available.  She indicated she had an off to take half of 150 mg nefazodone tablet for about 2 to 3 months and would like to do so. ? ?11/05/2019 appointment with the following noted:  Appt moved earlier DT the following. ?Had stopped Depakote and nefazadone and reflux got worse.  Restarting Depakote didn't help much.  Restarted nefazodaone and reflux better right away.  May not need Depakote but doesn't want to change.  ?Can't exercise DT back pain since Jan ans worse since March.  Getting new doctor.  ?More depressed and anxious and sleep problems DT back pain.   ?Usually only 0.25 mg alprazolam at night. ?Plan: has to stop nefazodone when it runs out bc no longer manufactured.  Therefore will  try trazodone in it's place. ? ?01/07/20 appt with the following noted: ?She and H both got Covid.  Was pretty sick for 10 days.  Lost taste.  Had little resp stuff but severe diarrhea and nausea and couldn't keep fluids down.  No residual sx. ?H had resp sx with pneumonia and had to get O2.  Hosp for 3 days. ?Still have some nefazodone and nursing it along. ?Tried trazodone 50 HS for 2 nights and didn't sleep well and had a HA  ?Nefazodone and Depakote both helped the GERD.  She has enough nefazadone for about 6 weeks. ?Plan: Nefazodone helped the reflux and she wants to take it as long as possible.  She didn't  have withdrawal off nefazodone. When she runs out then start trazodone.  If that fails then use Viibryd.  Disc SE and alternatives.   ?Rec try trazodone again but use 100 mg and see if sleeping better prevents the AM HA.  If HA recurs then viibryd. Starter kit.  ?Depakote didn't help as much as expected and she might stop it.  ? ?02/18/2020 phone call: Yolanda Carroll called to report that she is experiencing high anxiety.  It is causing chest discomfort and breathing problems. Has appt 11/18 and is on the wait list but needs to discuss how to relief this anxiety.  Please call. ?Response: Note ?Rtc to patient, she does have the [place] and does remember that discussion. She has not been able to start that because she received a steroid injection a couple weeks ago and caused her stomach to be upset. She didn't want to start it knowing that's a side effect. She feels like the steroid injection may have worsened all her symptoms. She did try the trazodone but it was too much for her, even taking a 1/2 tablet. She does have some serzone left and has been taking that until this improves. Also taking Xanax as needed during day if needed. Hoping all of this will improve soon. ?Informed her I would update Yolanda Carroll with her information.  ?   ?  ? ?04/01/2020 appointment with the following noted: ?Out of nefazodone a few weeks ago.  More reflux off it.  Tried trazodone a couple of times.  Tried 100 mg trazodone and heart skipped.  50 mg awoke with chest tightness.  Long haul Covid with GI sx.  Nausea and anxiety problems daily since mid September.  Covid early August and got monoclonal infusion which helped.  Headed to long haul Covid clinic in [place] Monday.   ?Needed to increase alprazolam to BID.  And 80% better with that.  Panic some out of bed.  Sx started after steroid shot for her back 9/21.   ?Hasn't tried Viibryd DT fear of GI worsening.  Poor appetite and lost 15#. ?Not taken tramadol yet. ?Migraine under control. ?Plan: no  med changes ? ?06/01/20 appt with following noted: ?Starting generic Depakote ER generic today. ?Started Mirtazapine 3.75 mg and it worked right away for nausea for a week.  Then increased to 7.5 mg HS.  Most days is feeling really well.  No SE except mild drowsiness. ?Still has post Covid problems with stomach but much better.  Regained 7# of needed weight.  Need to try to gain 10# more. ?Zofran failed. ?Had a lot anxiety with N but it's better now.  Was waking with panic and no longer N and anxiety better too. ?Severe scoliosis facing big surgery in summer with long recovery.  Can't ride in  car for long. ?Plan: No med changes ? ?08/18/2020 appointment with the following noted: ?Gained 12# and thankful for mirtazapine. ?Long Covid GI sx.  Better not gone with mirtazapine. ?Rare AM panic.  Sleep good. ?Scoliosis and may require surgery. Can't sit or stand for long ?Depakote brand ER 250 helps heartburn. Generic failed. ?Patient denies difficulty with sleep initiation with Xanax.. Denies appetite disturbance.  Patient reports that energy and motivation have been good.  Patient denies any difficulty with concentration.  Patient denies any suicidal ideation. ?More depressed and tearful with pain but varies with pain levels.  Good and bad days.  Not too depressed with anxiety worse. ?Plan: Continue mirtazapine 7.5 mg with Xanax for GI problems including nausea and appetite.  It has worked some.  Try higher dose. ?Insomnia managed with  With alprazolam.   ? ?11/18/2020 appointment with the following noted: ?Increased mirtazapine to 7.5 mg 1 and 1/2 tablet bc  less anxious with it. ?Facing big back surgery for severe scoliosis 11/30/20.  Can't drive in car for distance or travel. ?Now on Medicare. ?Depakote ER BRAND helped GI problems which were worse on generic. ?Not markedly depressed.  Patient reports stable mood and denies depressed or irritable moods.   Patient denies difficulty with sleep initiation or maintenance. Denies  appetite disturbance.  Patient reports that energy and motivation have been good.  Patient denies any difficulty with concentration.  Patient denies any suicidal ideation. ?Plan: No med changes ?Cornelius Moras

## 2021-08-09 ENCOUNTER — Ambulatory Visit (INDEPENDENT_AMBULATORY_CARE_PROVIDER_SITE_OTHER): Payer: Medicare Other | Admitting: Sports Medicine

## 2021-08-09 ENCOUNTER — Ambulatory Visit: Payer: Self-pay | Admitting: Surgery

## 2021-08-09 VITALS — BP 90/58 | Ht 68.0 in | Wt 120.0 lb

## 2021-08-09 DIAGNOSIS — M7742 Metatarsalgia, left foot: Secondary | ICD-10-CM | POA: Diagnosis not present

## 2021-08-09 DIAGNOSIS — R200 Anesthesia of skin: Secondary | ICD-10-CM | POA: Diagnosis not present

## 2021-08-09 DIAGNOSIS — M25559 Pain in unspecified hip: Secondary | ICD-10-CM

## 2021-08-09 NOTE — Patient Instructions (Signed)
?  I have moved your small metatarsal pad a little to the left to get it off of your great toe. ?If this is uncomfortable, I would like for you to remove the pad completely for a week or 2. ?If you find that your metatarsal pain returns, then start with placing the extra small metatarsal pad in the original spot. ?I have also given you an additional small metatarsal pad to use if needed. ? ?See me again in about 6 weeks we will check both your hamstring and your foot. ?

## 2021-08-10 NOTE — Progress Notes (Addendum)
Patient ID: Yolanda Carroll, female   DOB: Jun 01, 1955, 66 y.o.   MRN: [id number] ? ?[name] presents today for follow-up on posterior right hip pain secondary to proximal hamstring tendinopathy.  She has noticed some benefit from physical therapy.  She is also here to follow-up on some left great toe numbness.  That persists intermittently.  Symptoms began shortly after we placed a metatarsal pad on her orthotics.  The metatarsal pad has helped with her metatarsalgia but may be responsible for her intermittent toe numbness.  The numbness is on each side of the great toe.  She denies numbness elsewhere in her foot. ? ?Physical exam shows good strength of the foot and left great toe.  No swelling.  Sensations intact to light touch grossly.  Brisk capillary refill. ? ?I would like to experiment some with the metatarsal pad on the left orthotic.  [name] has undergone an extensive spine surgery for scoliosis so I am not quite sure if the metatarsal pad is responsible for the numbness but I would like to shift the small metatarsal pad that she has on her left orthotic a little more laterally to take some pressure off of the great toe.  I have instructed [name] to remove the pad altogether if this does not help.  If her metatarsal pain then returns, then she may start with trying an extra small metatarsal pad first before resuming use of the small metatarsal pad.  She will follow-up with me again in 4 weeks.  She will continue with physical therapy in the meantime for her proximal hamstring tendinopathy. ? ?This note was dictated using Dragon naturally speaking software and may contain errors in syntax, spelling, or content which have not been identified prior to signing this note.  ?

## 2021-08-16 ENCOUNTER — Ambulatory Visit: Payer: Self-pay | Admitting: Surgery

## 2021-08-16 ENCOUNTER — Other Ambulatory Visit: Payer: Self-pay | Admitting: Pediatrics

## 2021-08-16 DIAGNOSIS — K8020 Calculus of gallbladder without cholecystitis without obstruction: Secondary | ICD-10-CM

## 2021-08-17 ENCOUNTER — Other Ambulatory Visit: Payer: Self-pay | Admitting: Surgery

## 2021-08-17 DIAGNOSIS — K8020 Calculus of gallbladder without cholecystitis without obstruction: Secondary | ICD-10-CM

## 2021-08-18 ENCOUNTER — Telehealth: Payer: Self-pay | Admitting: Psychiatry

## 2021-08-18 ENCOUNTER — Other Ambulatory Visit: Payer: Self-pay | Admitting: Psychiatry

## 2021-08-18 DIAGNOSIS — F410 Panic disorder [episodic paroxysmal anxiety] without agoraphobia: Secondary | ICD-10-CM

## 2021-08-18 DIAGNOSIS — F32A Depression, unspecified: Secondary | ICD-10-CM

## 2021-08-18 DIAGNOSIS — F5105 Insomnia due to other mental disorder: Secondary | ICD-10-CM

## 2021-08-18 DIAGNOSIS — F411 Generalized anxiety disorder: Secondary | ICD-10-CM

## 2021-08-18 MED ORDER — MIRTAZAPINE 7.5 MG PO TABS: ORAL_TABLET | ORAL | 1 refills | Status: DC

## 2021-08-18 NOTE — Telephone Encounter (Signed)
Pt called at 9:20 am and said that the mirtazapine 7.5 mg that she has been taking from [facility] mail order is not the right brand manufacturer for her. So she wants refills cancelled and new script sent to [facility]  at [address]. ?

## 2021-08-24 ENCOUNTER — Ambulatory Visit
Admission: RE | Admit: 2021-08-24 | Discharge: 2021-08-24 | Disposition: A | Discharge status: Home / Self Care | Payer: Medicare Other | Source: Ambulatory Visit | Attending: Surgery | Admitting: Surgery

## 2021-08-24 DIAGNOSIS — K8020 Calculus of gallbladder without cholecystitis without obstruction: Secondary | ICD-10-CM

## 2021-08-30 NOTE — Pre-Procedure Instructions (Signed)
Surgical Instructions ? ? ? Your procedure is scheduled on Thursday 09/08/21. ? ? Report to [facility] Main Entrance "A" at 09:30 A.M., then check in with the Admitting office. ? Call this number if you have problems the morning of surgery: ? [phone] ? ? If you have any questions prior to your surgery date call [phone]: Open Monday-Friday 8am-4pm ? ? ? Remember: ? Do not eat after midnight the night before your surgery ? ?You may drink clear liquids until 08:30 A.M. the morning of your surgery.   ?Clear liquids allowed are: Water, Non-Citrus Juices (without pulp), Carbonated Beverages, Clear Tea, Black Coffee ONLY (NO MILK, CREAM OR POWDERED CREAMER of any kind), and Gatorade ?  ? Take these medicines the morning of surgery with A SIP OF WATER:  ? lansoprazole (PREVACID) ? methocarbamol (ROBAXIN) ? ? Take these medicines if needed:  ? ALPRAZolam Duanne Moron) ? HYDROcodone-acetaminophen ([place]) ? ?As of today, STOP taking any Aspirin (unless otherwise instructed by your surgeon) Aleve, Naproxen, Ibuprofen, Motrin, Advil, Goody's, BC's, all herbal medications, fish oil, and all vitamins. ? ?         ?Do not wear jewelry or makeup ?Do not wear lotions, powders, perfumes/colognes, or deodorant. ?Do not shave 48 hours prior to surgery.  Men may shave face and neck. ?Do not bring valuables to the hospital. ?Do not wear nail polish, gel polish, artificial nails, or any other type of covering on natural nails (fingers and toes) ?If you have artificial nails or gel coating that need to be removed by a nail salon, please have this removed prior to surgery. Artificial nails or gel coating may interfere with anesthesia's ability to adequately monitor your vital signs. ? ?[facility] is not responsible for any belongings or valuables. .  ? ?Do NOT Smoke (Tobacco/Vaping)  24 hours prior to your procedure ? ?If you use a CPAP at night, you may bring your mask for your overnight stay. ?  ?Contacts, glasses, hearing aids,  dentures or partials may not be worn into surgery, please bring cases for these belongings ?  ?For patients admitted to the hospital, discharge time will be determined by your treatment team. ?  ?Patients discharged the day of surgery will not be allowed to drive home, and someone needs to stay with them for 24 hours. ? ? ?SURGICAL WAITING ROOM VISITATION ?Patients having surgery or a procedure in a hospital may have two support people. ?Children under the age of 45 must have an adult with them who is not the patient. ?They may stay in the waiting area during the procedure and may switch out with other visitors. If the patient needs to stay at the hospital during part of their recovery, the visitor guidelines for inpatient rooms apply. ? ?Please refer to the [facility] website for the visitor guidelines for Inpatients (after your surgery is over and you are in a regular room).  ? ? ? ? ? ?Special instructions:   ? ?Oral Hygiene is also important to reduce your risk of infection.  Remember - BRUSH YOUR TEETH THE MORNING OF SURGERY WITH YOUR REGULAR TOOTHPASTE ? ? ?[facility]- Preparing For Surgery ? ?Before surgery, you can play an important role. Because skin is not sterile, your skin needs to be as free of germs as possible. You can reduce the number of germs on your skin by washing with CHG (chlorahexidine gluconate) Soap before surgery.  CHG is an antiseptic cleaner which kills germs and bonds with the skin to  continue killing germs even after washing.   ? ? ?Please do not use if you have an allergy to CHG or antibacterial soaps. If your skin becomes reddened/irritated stop using the CHG.  ?Do not shave (including legs and underarms) for at least 48 hours prior to first CHG shower. It is OK to shave your face. ? ?Please follow these instructions carefully. ?  ? ? Shower the NIGHT BEFORE SURGERY and the MORNING OF SURGERY with CHG Soap.  ? If you chose to wash your hair, wash your hair first as usual with your  normal shampoo. After you shampoo, rinse your hair and body thoroughly to remove the shampoo.  Then [employer] and genitals (private parts) with your normal soap and rinse thoroughly to remove soap. ? ?After that Use CHG Soap as you would any other liquid soap. You can apply CHG directly to the skin and wash gently with a scrungie or a clean washcloth.  ? ?Apply the CHG Soap to your body ONLY FROM THE NECK DOWN.  Do not use on open wounds or open sores. Avoid contact with your eyes, ears, mouth and genitals (private parts). Wash Face and genitals (private parts)  with your normal soap.  ? ?Wash thoroughly, paying special attention to the area where your surgery will be performed. ? ?Thoroughly rinse your body with warm water from the neck down. ? ?DO NOT shower/wash with your normal soap after using and rinsing off the CHG Soap. ? ?Pat yourself dry with a CLEAN TOWEL. ? ?Wear CLEAN PAJAMAS to bed the night before surgery ? ?Place CLEAN SHEETS on your bed the night before your surgery ? ?DO NOT SLEEP WITH PETS. ? ? ?Day of Surgery: ? ?Take a shower with CHG soap. ?Wear Clean/Comfortable clothing the morning of surgery ?Do not apply any deodorants/lotions.   ?Remember to brush your teeth WITH YOUR REGULAR TOOTHPASTE. ? ? ? ?If you received a COVID test during your pre-op visit  it is requested that you wear a mask when out in public, stay away from anyone that may not be feeling well and notify your surgeon if you develop symptoms. If you have been in contact with anyone that has tested positive in the last 10 days please notify you surgeon. ? ?  ?Please read over the following fact sheets that you were given.  ? ?

## 2021-08-31 ENCOUNTER — Encounter (HOSPITAL_COMMUNITY)
Admission: RE | Admit: 2021-08-31 | Discharge: 2021-08-31 | Disposition: A | Discharge status: Home / Self Care | Payer: Medicare Other | Source: Ambulatory Visit | Attending: Surgery | Admitting: Surgery

## 2021-08-31 ENCOUNTER — Other Ambulatory Visit: Payer: Self-pay

## 2021-08-31 ENCOUNTER — Encounter (HOSPITAL_COMMUNITY): Payer: Self-pay

## 2021-08-31 VITALS — BP 121/61 | HR 80 | Temp 97.9°F | Resp 17 | Ht 68.0 in | Wt 121.2 lb

## 2021-08-31 DIAGNOSIS — K8020 Calculus of gallbladder without cholecystitis without obstruction: Secondary | ICD-10-CM | POA: Diagnosis not present

## 2021-08-31 DIAGNOSIS — Z01812 Encounter for preprocedural laboratory examination: Secondary | ICD-10-CM | POA: Diagnosis present

## 2021-08-31 DIAGNOSIS — Z01818 Encounter for other preprocedural examination: Secondary | ICD-10-CM

## 2021-08-31 HISTORY — DX: Age-related osteoporosis without current pathological fracture: M81.0

## 2021-08-31 HISTORY — DX: Other specified disorders of bone density and structure, unspecified site: M85.80

## 2021-08-31 HISTORY — DX: Anxiety disorder, unspecified: F41.9

## 2021-08-31 HISTORY — DX: Personal history of other diseases of the digestive system: Z87.19

## 2021-08-31 LAB — COMPREHENSIVE METABOLIC PANEL
ALT: 13 U/L (ref 0–44)
AST: 18 U/L (ref 15–41)
Albumin: 4 g/dL (ref 3.5–5.0)
Alkaline Phosphatase: 36 U/L — ABNORMAL LOW (ref 38–126)
Anion gap: 6 (ref 5–15)
BUN: 7 mg/dL — ABNORMAL LOW (ref 8–23)
CO2: 31 mmol/L (ref 22–32)
Calcium: 9.4 mg/dL (ref 8.9–10.3)
Chloride: 103 mmol/L (ref 98–111)
Creatinine, Ser: 0.73 mg/dL (ref 0.44–1.00)
GFR, Estimated: >60 mL/min (ref >60)
Glucose, Bld: 98 mg/dL (ref 70–99)
Potassium: 4.4 mmol/L (ref 3.5–5.1)
Sodium: 140 mmol/L (ref 135–145)
Total Bilirubin: 0.6 mg/dL (ref 0.3–1.2)
Total Protein: 6.9 g/dL (ref 6.5–8.1)

## 2021-08-31 LAB — CBC
HCT: 40.1 % (ref 36.0–46.0)
Hemoglobin: 12.8 g/dL (ref 12.0–15.0)
MCH: 30.6 pg (ref 26.0–34.0)
MCHC: 31.9 g/dL (ref 30.0–36.0)
MCV: 95.9 fL (ref 80.0–100.0)
Platelets: 205 10*3/uL (ref 150–400)
RBC: 4.18 MIL/uL (ref 3.87–5.11)
RDW: 12.4 % (ref 11.5–15.5)
WBC: 4.2 10*3/uL (ref 4.0–10.5)
nRBC: 0 % (ref 0.0–0.2)

## 2021-08-31 NOTE — Progress Notes (Signed)
PCP - Dr. Jonathon Jordan ?Cardiologist - denies ? ?PPM/ICD - denies ? ? ?Chest x-ray - 12/21/19 ?EKG - 03/15/20 ?Stress Test - denies ?ECHO - 04/06/20 ?Cardiac Cath - denies ? ?Sleep Study - denies ? ? ?DM- denies ? ?ASA/Blood Thinner Instructions: n/a ? ? ?ERAS Protcol - yes, no drink ? ? ?COVID TEST- n/a ? ? ?Anesthesia review: no ? ?Patient denies shortness of breath, fever, cough and chest pain at PAT appointment ? ? ?All instructions explained to the patient, with a verbal understanding of the material. Patient agrees to go over the instructions while at home for a better understanding. Patient also instructed to notify surgeon of any COVID contact or symptoms. The opportunity to ask questions was provided. ?  ?

## 2021-09-08 ENCOUNTER — Inpatient Hospital Stay (HOSPITAL_COMMUNITY): Payer: Medicare Other

## 2021-09-08 ENCOUNTER — Inpatient Hospital Stay (HOSPITAL_COMMUNITY): Payer: Medicare Other | Admitting: Certified Registered Nurse Anesthetist

## 2021-09-08 ENCOUNTER — Encounter (HOSPITAL_COMMUNITY)
Admission: RE | Disposition: A | Discharge status: Home / Self Care | Payer: Self-pay | Source: Home / Self Care | Attending: Surgery

## 2021-09-08 ENCOUNTER — Inpatient Hospital Stay (HOSPITAL_BASED_OUTPATIENT_CLINIC_OR_DEPARTMENT_OTHER): Payer: Medicare Other | Admitting: Certified Registered Nurse Anesthetist

## 2021-09-08 ENCOUNTER — Other Ambulatory Visit: Payer: Self-pay

## 2021-09-08 ENCOUNTER — Observation Stay (HOSPITAL_COMMUNITY)
Admission: RE | Admit: 2021-09-08 | Discharge: 2021-09-09 | Disposition: A | Discharge status: Home / Self Care | Payer: Medicare Other | Attending: Surgery | Admitting: Surgery

## 2021-09-08 ENCOUNTER — Encounter (HOSPITAL_COMMUNITY): Payer: Self-pay | Admitting: Surgery

## 2021-09-08 DIAGNOSIS — K8020 Calculus of gallbladder without cholecystitis without obstruction: Principal | ICD-10-CM | POA: Diagnosis present

## 2021-09-08 DIAGNOSIS — Z87891 Personal history of nicotine dependence: Secondary | ICD-10-CM | POA: Insufficient documentation

## 2021-09-08 DIAGNOSIS — R109 Unspecified abdominal pain: Secondary | ICD-10-CM | POA: Diagnosis present

## 2021-09-08 DIAGNOSIS — K8010 Calculus of gallbladder with chronic cholecystitis without obstruction: Secondary | ICD-10-CM | POA: Diagnosis not present

## 2021-09-08 HISTORY — PX: CHOLECYSTECTOMY: SHX55

## 2021-09-08 SURGERY — LAPAROSCOPIC CHOLECYSTECTOMY
Anesthesia: General

## 2021-09-08 MED ORDER — HYDROMORPHONE HCL 1 MG/ML IJ SOLN
1.0000 mg | INTRAMUSCULAR | Status: DC | PRN
  Administered 2021-09-08 – 2021-09-09 (×5): 1 mg via INTRAVENOUS
  Filled 2021-09-08 (×6): qty 1

## 2021-09-08 MED ORDER — BUPIVACAINE-EPINEPHRINE (PF) 0.25% -1:200000 IJ SOLN
INTRAMUSCULAR | Status: AC
  Filled 2021-09-08: qty 30

## 2021-09-08 MED ORDER — ACETAMINOPHEN 650 MG RE SUPP: 650.0000 mg | Freq: Four times a day (QID) | RECTAL | Status: DC | PRN

## 2021-09-08 MED ORDER — METHOCARBAMOL 750 MG PO TABS
750.0000 mg | ORAL_TABLET | Freq: Three times a day (TID) | ORAL | Status: DC
  Administered 2021-09-08 – 2021-09-09 (×3): 750 mg via ORAL
  Filled 2021-09-08 (×3): qty 1

## 2021-09-08 MED ORDER — DEXAMETHASONE SODIUM PHOSPHATE 10 MG/ML IJ SOLN
INTRAMUSCULAR | Status: DC | PRN
  Administered 2021-09-08: 10 mg via INTRAVENOUS

## 2021-09-08 MED ORDER — SODIUM CHLORIDE 0.9 % IR SOLN
Status: DC | PRN
  Administered 2021-09-08: 1000 mL

## 2021-09-08 MED ORDER — FENTANYL CITRATE (PF) 250 MCG/5ML IJ SOLN
INTRAMUSCULAR | Status: DC | PRN
  Administered 2021-09-08: 100 ug via INTRAVENOUS

## 2021-09-08 MED ORDER — HYDROMORPHONE HCL 1 MG/ML IJ SOLN
0.2500 mg | INTRAMUSCULAR | Status: DC | PRN
  Administered 2021-09-08 (×2): 0.5 mg via INTRAVENOUS

## 2021-09-08 MED ORDER — INDOCYANINE GREEN 25 MG IV SOLR
7.5000 mg | Freq: Once | INTRAVENOUS | Status: DC
  Filled 2021-09-08: qty 10

## 2021-09-08 MED ORDER — DEXTROSE-NACL 5-0.9 % IV SOLN: INTRAVENOUS | Status: DC

## 2021-09-08 MED ORDER — OXYCODONE HCL 5 MG PO TABS: 5.0000 mg | ORAL_TABLET | Freq: Four times a day (QID) | ORAL | 0 refills | Status: DC | PRN

## 2021-09-08 MED ORDER — ONDANSETRON HCL 4 MG/2ML IJ SOLN: 4.0000 mg | Freq: Four times a day (QID) | INTRAMUSCULAR | Status: DC | PRN

## 2021-09-08 MED ORDER — CHLORHEXIDINE GLUCONATE CLOTH 2 % EX PADS: 6.0000 | MEDICATED_PAD | Freq: Once | CUTANEOUS | Status: DC

## 2021-09-08 MED ORDER — CLINDAMYCIN PHOSPHATE 900 MG/50ML IV SOLN
900.0000 mg | INTRAVENOUS | Status: AC
  Administered 2021-09-08: 900 mg via INTRAVENOUS
  Filled 2021-09-08: qty 50

## 2021-09-08 MED ORDER — GABAPENTIN 300 MG PO CAPS
300.0000 mg | ORAL_CAPSULE | Freq: Every evening | ORAL | Status: DC
  Administered 2021-09-08: 300 mg via ORAL
  Filled 2021-09-08: qty 1

## 2021-09-08 MED ORDER — SCOPOLAMINE 1 MG/3DAYS TD PT72
MEDICATED_PATCH | TRANSDERMAL | Status: AC
  Filled 2021-09-08: qty 1

## 2021-09-08 MED ORDER — LACTINEX PO CHEW
1.0000 | CHEWABLE_TABLET | Freq: Every day | ORAL | Status: DC
  Administered 2021-09-09: 1 via ORAL
  Filled 2021-09-08: qty 1

## 2021-09-08 MED ORDER — ONDANSETRON HCL 4 MG/2ML IJ SOLN
INTRAMUSCULAR | Status: AC
  Filled 2021-09-08: qty 2

## 2021-09-08 MED ORDER — LIDOCAINE 2% (20 MG/ML) 5 ML SYRINGE
INTRAMUSCULAR | Status: AC
  Filled 2021-09-08: qty 5

## 2021-09-08 MED ORDER — BUPIVACAINE-EPINEPHRINE 0.25% -1:200000 IJ SOLN
INTRAMUSCULAR | Status: DC | PRN
  Administered 2021-09-08: 18 mL
  Administered 2021-09-08: 12 mL

## 2021-09-08 MED ORDER — CHLORHEXIDINE GLUCONATE 0.12 % MT SOLN
15.0000 mL | Freq: Once | OROMUCOSAL | Status: AC
  Administered 2021-09-08: 15 mL via OROMUCOSAL
  Filled 2021-09-08: qty 15

## 2021-09-08 MED ORDER — ONDANSETRON HCL 4 MG PO TABS: 4.0000 mg | ORAL_TABLET | Freq: Every day | ORAL | 1 refills | Status: DC | PRN

## 2021-09-08 MED ORDER — LIDOCAINE 2% (20 MG/ML) 5 ML SYRINGE
INTRAMUSCULAR | Status: DC | PRN
  Administered 2021-09-08: 50 mg via INTRAVENOUS

## 2021-09-08 MED ORDER — MIDAZOLAM HCL 2 MG/2ML IJ SOLN
INTRAMUSCULAR | Status: DC | PRN
  Administered 2021-09-08: 2 mg via INTRAVENOUS

## 2021-09-08 MED ORDER — MIDAZOLAM HCL 2 MG/2ML IJ SOLN
INTRAMUSCULAR | Status: AC
  Filled 2021-09-08: qty 2

## 2021-09-08 MED ORDER — CHLORHEXIDINE GLUCONATE 0.12 % MT SOLN: 15.0000 mL | Freq: Once | OROMUCOSAL | Status: DC

## 2021-09-08 MED ORDER — ORAL CARE MOUTH RINSE: 15.0000 mL | Freq: Once | OROMUCOSAL | Status: DC

## 2021-09-08 MED ORDER — ORAL CARE MOUTH RINSE: 15.0000 mL | Freq: Once | OROMUCOSAL | Status: AC

## 2021-09-08 MED ORDER — HYDROMORPHONE HCL 1 MG/ML IJ SOLN
INTRAMUSCULAR | Status: AC
  Filled 2021-09-08: qty 1

## 2021-09-08 MED ORDER — 0.9 % SODIUM CHLORIDE (POUR BTL) OPTIME
TOPICAL | Status: DC | PRN
  Administered 2021-09-08: 1000 mL

## 2021-09-08 MED ORDER — LACTATED RINGERS IV SOLN: INTRAVENOUS | Status: DC

## 2021-09-08 MED ORDER — RISAQUAD PO CAPS
1.0000 | ORAL_CAPSULE | Freq: Every day | ORAL | Status: DC
  Administered 2021-09-08: 1 via ORAL
  Filled 2021-09-08 (×2): qty 1

## 2021-09-08 MED ORDER — PANTOPRAZOLE SODIUM 20 MG PO TBEC
20.0000 mg | DELAYED_RELEASE_TABLET | Freq: Every day | ORAL | Status: DC
Start: 2021-09-08 — End: 2021-09-09
  Administered 2021-09-08 – 2021-09-09 (×2): 20 mg via ORAL
  Filled 2021-09-08 (×2): qty 1

## 2021-09-08 MED ORDER — PHENYLEPHRINE 80 MCG/ML (10ML) SYRINGE FOR IV PUSH (FOR BLOOD PRESSURE SUPPORT)
PREFILLED_SYRINGE | INTRAVENOUS | Status: DC | PRN
  Administered 2021-09-08 (×6): 80 ug via INTRAVENOUS

## 2021-09-08 MED ORDER — MEPERIDINE HCL 25 MG/ML IJ SOLN: 6.2500 mg | INTRAMUSCULAR | Status: DC | PRN

## 2021-09-08 MED ORDER — METOPROLOL TARTRATE 5 MG/5ML IV SOLN: 5.0000 mg | Freq: Four times a day (QID) | INTRAVENOUS | Status: DC | PRN

## 2021-09-08 MED ORDER — ROCURONIUM BROMIDE 10 MG/ML (PF) SYRINGE
PREFILLED_SYRINGE | INTRAVENOUS | Status: DC | PRN
  Administered 2021-09-08 (×2): 10 mg via INTRAVENOUS
  Administered 2021-09-08: 50 mg via INTRAVENOUS

## 2021-09-08 MED ORDER — AMISULPRIDE (ANTIEMETIC) 5 MG/2ML IV SOLN: 10.0000 mg | Freq: Once | INTRAVENOUS | Status: DC | PRN

## 2021-09-08 MED ORDER — FENTANYL CITRATE (PF) 250 MCG/5ML IJ SOLN
INTRAMUSCULAR | Status: AC
  Filled 2021-09-08: qty 5

## 2021-09-08 MED ORDER — ACETAMINOPHEN 325 MG PO TABS
650.0000 mg | ORAL_TABLET | Freq: Four times a day (QID) | ORAL | Status: DC | PRN
  Administered 2021-09-08: 650 mg via ORAL
  Filled 2021-09-08: qty 2

## 2021-09-08 MED ORDER — SCOPOLAMINE 1 MG/3DAYS TD PT72: 1.0000 | MEDICATED_PATCH | Freq: Once | TRANSDERMAL | Status: DC

## 2021-09-08 MED ORDER — PROMETHAZINE HCL 25 MG/ML IJ SOLN: 6.2500 mg | INTRAMUSCULAR | Status: DC | PRN

## 2021-09-08 MED ORDER — MIRTAZAPINE 15 MG PO TABS
7.5000 mg | ORAL_TABLET | Freq: Every day | ORAL | Status: DC
  Administered 2021-09-08: 7.5 mg via ORAL
  Filled 2021-09-08: qty 1

## 2021-09-08 MED ORDER — PROPOFOL 10 MG/ML IV BOLUS
INTRAVENOUS | Status: DC | PRN
  Administered 2021-09-08: 150 mg via INTRAVENOUS

## 2021-09-08 MED ORDER — PROPOFOL 10 MG/ML IV BOLUS
INTRAVENOUS | Status: AC
  Filled 2021-09-08: qty 20

## 2021-09-08 MED ORDER — DEXAMETHASONE SODIUM PHOSPHATE 10 MG/ML IJ SOLN
INTRAMUSCULAR | Status: AC
  Filled 2021-09-08: qty 1

## 2021-09-08 MED ORDER — ROCURONIUM BROMIDE 10 MG/ML (PF) SYRINGE
PREFILLED_SYRINGE | INTRAVENOUS | Status: AC
  Filled 2021-09-08: qty 10

## 2021-09-08 MED ORDER — HEMOSTATIC AGENTS (NO CHARGE) OPTIME
TOPICAL | Status: DC | PRN
  Administered 2021-09-08 (×2): 1 via TOPICAL

## 2021-09-08 MED ORDER — ONDANSETRON HCL 4 MG/2ML IJ SOLN
INTRAMUSCULAR | Status: DC | PRN
  Administered 2021-09-08: 4 mg via INTRAVENOUS

## 2021-09-08 MED ORDER — ONDANSETRON 4 MG PO TBDP: 4.0000 mg | ORAL_TABLET | Freq: Four times a day (QID) | ORAL | Status: DC | PRN

## 2021-09-08 MED ORDER — ENOXAPARIN SODIUM 40 MG/0.4ML IJ SOSY
40.0000 mg | PREFILLED_SYRINGE | INTRAMUSCULAR | Status: DC
  Administered 2021-09-09: 40 mg via SUBCUTANEOUS
  Filled 2021-09-08: qty 0.4

## 2021-09-08 MED ORDER — SUGAMMADEX SODIUM 200 MG/2ML IV SOLN
INTRAVENOUS | Status: DC | PRN
  Administered 2021-09-08: 200 mg via INTRAVENOUS

## 2021-09-08 MED ORDER — ALPRAZOLAM 0.25 MG PO TABS
0.2500 mg | ORAL_TABLET | Freq: Three times a day (TID) | ORAL | Status: DC | PRN
  Filled 2021-09-08: qty 1

## 2021-09-08 MED ORDER — SCOPOLAMINE 1 MG/3DAYS TD PT72
MEDICATED_PATCH | TRANSDERMAL | Status: AC
  Administered 2021-09-08: 1.5 mg via TRANSDERMAL
  Filled 2021-09-08: qty 1

## 2021-09-08 MED ORDER — DIVALPROEX SODIUM ER 250 MG PO TB24
250.0000 mg | ORAL_TABLET | Freq: Every day | ORAL | Status: DC
  Administered 2021-09-08: 250 mg via ORAL
  Filled 2021-09-08 (×2): qty 1

## 2021-09-08 SURGICAL SUPPLY — 46 items
ADH SKN CLS APL DERMABOND .7 (GAUZE/BANDAGES/DRESSINGS) ×1
APL PRP STRL LF DISP 70% ISPRP (MISCELLANEOUS) ×1
APL SRG 38 LTWT LNG FL B (MISCELLANEOUS) ×1
APPLICATOR ARISTA FLEXITIP XL (MISCELLANEOUS) ×1 IMPLANT
APPLIER CLIP ROT 10 11.4 M/L (STAPLE) ×2
APR CLP MED LRG 11.4X10 (STAPLE) ×1
BAG COUNTER SPONGE SURGICOUNT (BAG) ×2 IMPLANT
BAG SPEC RTRVL 10 TROC 200 (ENDOMECHANICALS) ×1
BAG SPNG CNTER NS LX DISP (BAG) ×1
CANISTER SUCT 3000ML PPV (MISCELLANEOUS) ×2 IMPLANT
CHLORAPREP W/TINT 26 (MISCELLANEOUS) ×2 IMPLANT
CLIP APPLIE ROT 10 11.4 M/L (STAPLE) ×1 IMPLANT
COVER SURGICAL LIGHT HANDLE (MISCELLANEOUS) ×2 IMPLANT
DERMABOND ADVANCED (GAUZE/BANDAGES/DRESSINGS) ×1
DERMABOND ADVANCED .7 DNX12 (GAUZE/BANDAGES/DRESSINGS) ×1 IMPLANT
ELECT REM PT RETURN 9FT ADLT (ELECTROSURGICAL) ×2
ELECTRODE REM PT RTRN 9FT ADLT (ELECTROSURGICAL) ×1 IMPLANT
GLOVE BIO SURGEON STRL SZ8 (GLOVE) ×2 IMPLANT
GLOVE BIOGEL PI IND STRL 8 (GLOVE) ×1 IMPLANT
GLOVE BIOGEL PI INDICATOR 8 (GLOVE) ×1
GOWN STRL REUS W/ TWL LRG LVL3 (GOWN DISPOSABLE) ×2 IMPLANT
GOWN STRL REUS W/ TWL XL LVL3 (GOWN DISPOSABLE) ×1 IMPLANT
GOWN STRL REUS W/TWL LRG LVL3 (GOWN DISPOSABLE) ×4
GOWN STRL REUS W/TWL XL LVL3 (GOWN DISPOSABLE) ×2
HEMOSTAT ARISTA ABSORB 3G PWDR (HEMOSTASIS) ×2 IMPLANT
KIT BASIN OR (CUSTOM PROCEDURE TRAY) ×2 IMPLANT
KIT TURNOVER KIT B (KITS) ×2 IMPLANT
NS IRRIG 1000ML POUR BTL (IV SOLUTION) ×2 IMPLANT
PAD ARMBOARD 7.5X6 YLW CONV (MISCELLANEOUS) ×2 IMPLANT
POUCH RETRIEVAL ECOSAC 10 (ENDOMECHANICALS) ×1 IMPLANT
POUCH RETRIEVAL ECOSAC 10MM (ENDOMECHANICALS) ×2
SCISSORS LAP 5X35 DISP (ENDOMECHANICALS) ×2 IMPLANT
SET IRRIG TUBING LAPAROSCOPIC (IRRIGATION / IRRIGATOR) ×2 IMPLANT
SET TUBE SMOKE EVAC HIGH FLOW (TUBING) ×2 IMPLANT
SLEEVE ENDOPATH XCEL 5M (ENDOMECHANICALS) ×2 IMPLANT
SPECIMEN JAR SMALL (MISCELLANEOUS) ×2 IMPLANT
SUT MNCRL AB 4-0 PS2 18 (SUTURE) ×2 IMPLANT
SUT VICRYL 0 UR6 27IN ABS (SUTURE) ×2 IMPLANT
TOWEL GREEN STERILE (TOWEL DISPOSABLE) ×2 IMPLANT
TOWEL GREEN STERILE FF (TOWEL DISPOSABLE) ×2 IMPLANT
TRAY LAPAROSCOPIC MC (CUSTOM PROCEDURE TRAY) ×2 IMPLANT
TROCAR XCEL BLUNT TIP 100MML (ENDOMECHANICALS) ×2 IMPLANT
TROCAR XCEL NON-BLD 11X100MML (ENDOMECHANICALS) ×2 IMPLANT
TROCAR XCEL NON-BLD 5MMX100MML (ENDOMECHANICALS) ×2 IMPLANT
WARMER LAPAROSCOPE (MISCELLANEOUS) ×2 IMPLANT
WATER STERILE IRR 1000ML POUR (IV SOLUTION) ×2 IMPLANT

## 2021-09-08 NOTE — H&P (Signed)
Chief Complaint: New Patient (Gallbladder ) ? ? ?History of Present Illness: ?Yolanda Carroll is a 66 y.o. female who is seen today as an office consultation for evaluation of New Patient (Gallbladder ) ?.  ? ?Patient presents for evaluation of gallstones and symptoms of abdominal pain with nausea and vomiting. She had a longstanding history of discomfort as well as nausea and vomiting after eating. She is undergone extensive work-up in ultrasound revealed gallstones. This is back in 2021. Her symptoms subsided but have recurred. Location is mid abdomen. She also has nausea and vomiting is other symptoms with eating. No fever or chills. ? ?Review of Systems: ?A complete review of systems was obtained from the patient. I have reviewed this information and discussed as appropriate with the patient. See HPI as well for other ROS. ? ? ? ?Medical History: ?Past Medical History:  ?Diagnosis Date  ? GERD (gastroesophageal reflux disease) 07/15/2012  ? Osteoarthritis 01/13/2005  ? ?There is no problem list on file for this patient. ? ?Past Surgical History:  ?Procedure Laterality Date  ? APPENDECTOMY 09/12/1976  ? HERNIA REPAIR 07/18/1995  ? BACK SURGERY 04/29/2011  ? ? ?Allergies  ?Allergen Reactions  ? Penicillins Other (See Comments) and Unknown  ?Seizure as a child ?Seizure (baby) ?Seizure as a child ? ? Atarax [Hydroxyzine Hcl] Headache and Other (See Comments)  ? Erythromycin Nausea  ? ?Upset stomache  ? Erythromycin Base Other (See Comments)  ? Hydrocodone-Acetaminophen Itching  ? Hydroxyzine Other (See Comments)  ?Headache, depression ?Headache, depression ? ? Metronidazole Dizziness, Other (See Comments) and Palpitations  ?Dizzy and increased heart rate ?Dizzy and increased heart rate ? ? Nortriptyline Anxiety and Itching  ? Omeprazole Other (See Comments)  ?Abdominal pain ?Abdominal pain ? ? Ropinirole Other (See Comments)  ?other ?Made sx worse ? ? Tramadol Itching  ? Celexa [Citalopram] Other (See Comments) and  Palpitations  ?Chest pain ?Irregular heart beat, chest discomfort  ?Chest pain ? ? Oxycodone-Acetaminophen Itching  ? Propoxyphene N-Acetaminophen Itching  ? ?Current Outpatient Medications on File Prior to Visit  ?Medication Sig Dispense Refill  ? ALPRAZolam (XANAX) 0.25 MG tablet  ? divalproex (DEPAKOTE ER) 250 MG ER tablet 1 tablet  ? gabapentin (NEURONTIN) 100 MG capsule 2 tabs  ? lansoprazole (PREVACID) 30 MG DR capsule TAKE ONE CAPSULE BY MOUTH TWICE A DAY APPROXIMATELY 30 MINUTES BEFORE BREAKFAST AND SUPPER  ? menthol (BIOFREEZE, MENTHOL,) 10 % Crea as directed  ? methocarbamoL (ROBAXIN) 750 MG tablet 500 mg per 1 tablet Tablet ORAL q6h PRN Muscle Spasm, Routine, 12/15/20 11:08:00 EDT, 3 day(s), Stop date 12/18/20 11:07:00 EDT  ? mirtazapine (REMERON) 7.5 MG tablet 2 tablets at bedtime  ? traMADoL (ULTRAM) 50 mg tablet Take 1 tablet (50 mg total) by mouth every 8 (eight) hours as needed for Pain 15 tablet 0  ? ?No current facility-administered medications on file prior to visit.  ? ?Family History  ?Problem Relation Age of Onset  ? Depression Mother  ? Osteoarthritis Mother  ? Reflux disease Mother  ? Skin cancer Brother  ? ? ?Social History  ? ?Tobacco Use  ?Smoking Status Former  ? Packs/day: 0.00  ? Years: 0.00  ? Pack years: 0.00  ? Types: Cigarettes  ? Start date: 12/14/1975  ? Quit date: 07/13/1980  ? Years since quitting: 41.1  ?Smokeless Tobacco Never  ? ? ?Social History  ? ?Socioeconomic History  ? Marital status: Married  ?Tobacco Use  ? Smoking status: Former  ?Packs/day: 0.00  ?Years:  0.00  ?Pack years: 0.00  ?Types: Cigarettes  ?Start date: 12/14/1975  ?Quit date: 07/13/1980  ?Years since quitting: 41.1  ? Smokeless tobacco: Never  ?Substance and Sexual Activity  ? Alcohol use: Yes  ?Comment: 3 glasses of wine per week  ? Drug use: Never  ? Sexual activity: Yes  ?Partners: Male  ?Birth control/protection: Post-menopausal  ? ?Objective:  ? ?Vitals:  ?08/05/21 1103  ?BP: 108/68  ?Pulse: 77  ?Temp: 36.8 ?C  (98.3 ?F)  ?SpO2: 99%  ?Weight: 55.2 kg (121 lb 9.6 oz)  ? ?Body mass index is 18.76 kg/m?. ? ?Physical Exam ?Constitutional:  ?Appearance: Normal appearance.  ?HENT:  ?Head: Normocephalic.  ?Eyes:  ?General: No scleral icterus. ?Cardiovascular:  ?Rate and Rhythm: Normal rate.  ?Abdominal:  ?General: Abdomen is flat.  ?Tenderness: There is no abdominal tenderness. There is no rebound.  ?Hernia: No hernia is present.  ?Musculoskeletal:  ?General: Normal range of motion.  ?Cervical back: Normal range of motion.  ?Skin: ?General: Skin is warm and dry.  ?Neurological:  ?General: No focal deficit present.  ?Mental Status: She is alert and oriented to person, place, and time.  ?Psychiatric:  ?Mood and Affect: Mood normal.  ?Behavior: Behavior normal.  ? ? ? ?Labs, Imaging and Diagnostic Testing: ? ?EXAM: ?ULTRASOUND ABDOMEN LIMITED RIGHT UPPER QUADRANT ?  ?COMPARISON: None. ?  ?FINDINGS: ?Gallbladder: ?  ?Cholelithiasis the largest which measures 5 mm. No gallbladder wall ?thickening visualized. No sonographic Murphy sign noted by ?sonographer. ?  ?Common bile duct: ?  ?Diameter: 3 mm ?  ?Liver: ?  ?No focal lesion identified. Within normal limits in parenchymal ?echogenicity. Portal vein is patent on color Doppler imaging with ?normal direction of blood flow towards the liver. ?  ?Other: None. ?  ?IMPRESSION: ?Cholelithiasis without sonographic evidence of acute cholecystitis. ?  ?  ?Electronically Signed ?By: Dahlia Bailiff MD ?On: 04/14/2020 15:17 ?  ?Assessment and Plan:  ? ?Diagnoses and all orders for this visit: ? ?Calculus of gallbladder without cholecystitis without obstruction ?- US gallbladder; Future ? ? ? ?Patient with signs and symptoms of symptomatic cholelithiasis. Recommend laparoscopic cholecystectomy possible cholangiogram. Risk and benefits of surgery discussed as well as nonoperative management measures. Risk of bleeding, infection, common bile duct injury, injury neighboring structures, injury to  internal viscera, cardiovascular events and complications from anesthesia discussed. Long-term expectation as well as any dietary changes discussed as well. She wishes to proceed. ? ?No follow-ups on file. ? ?Kennieth Francois, MD  ? ?.  ? ?

## 2021-09-08 NOTE — Transfer of Care (Signed)
Immediate Anesthesia Transfer of Care Note ? ?Patient: Yolanda Carroll ? ?Procedure(s) Performed: LAPAROSCOPIC CHOLECYSTECTOMY ? ?Patient Location: PACU ? ?Anesthesia Type:General ? ?Level of Consciousness: drowsy, patient cooperative and responds to stimulation ? ?Airway & Oxygen Therapy: Patient Spontanous Breathing and Patient connected to nasal cannula oxygen ? ?Post-op Assessment: Report given to RN, Post -op Vital signs reviewed and stable and Patient moving all extremities X 4 ? ?Post vital signs: Reviewed and stable ? ?Last Vitals:  ?Vitals Value Taken Time  ?BP 138/120 09/08/21 1315  ?Temp    ?Pulse 87 09/08/21 1316  ?Resp 17 09/08/21 1316  ?SpO2 99 % 09/08/21 1316  ?Vitals shown include unvalidated device data. ? ?Last Pain:  ?Vitals:  ? 09/08/21 1009  ?TempSrc:   ?PainSc: 4   ?   ? ?Patients Stated Pain Goal: 3 (09/08/21 1009) ? ?Complications: No notable events documented. ?

## 2021-09-08 NOTE — Discharge Instructions (Signed)
[facility] ______[facility] LAPAROSCOPIC SURGERY: POST OP INSTRUCTIONS Always review your discharge instruction sheet given to you by the facility where your surgery was performed. IF YOU HAVE DISABILITY OR FAMILY LEAVE FORMS, YOU MUST BRING THEM TO THE OFFICE FOR PROCESSING.   DO NOT GIVE THEM TO YOUR DOCTOR.  A prescription for pain medication may be given to you upon discharge.  Take your pain medication as prescribed, if needed.  If narcotic pain medicine is not needed, then you may take acetaminophen (Tylenol) or ibuprofen (Advil) as needed. Take your usually prescribed medications unless otherwise directed. If you need a refill on your pain medication, please contact your pharmacy.  They will contact our office to request authorization. Prescriptions will not be filled after 5pm or on week-ends. You should follow a light diet the first few days after arrival home, such as soup and crackers, etc.  Be sure to include lots of fluids daily. Most patients will experience some swelling and bruising in the area of the incisions.  Ice packs will help.  Swelling and bruising can take several days to resolve.  It is common to experience some constipation if taking pain medication after surgery.  Increasing fluid intake and taking a stool softener (such as Colace) will usually help or prevent this problem from occurring.  A mild laxative (Milk of Magnesia or Miralax) should be taken according to package instructions if there are no bowel movements after 48 hours. Unless discharge instructions indicate otherwise, you may remove your bandages 24-48 hours after surgery, and you may shower at that time.  You may have steri-strips (small skin tapes) in place directly over the incision.  These strips should be left on the skin for 7-10 days.  If your surgeon used skin glue on the incision, you may shower in 24 hours.  The glue will flake off over the next 2-3 weeks.  Any sutures or staples will be  removed at the office during your follow-up visit. ACTIVITIES:  You may resume regular (light) daily activities beginning the next day--such as daily self-care, walking, climbing stairs--gradually increasing activities as tolerated.  You may have sexual intercourse when it is comfortable.  Refrain from any heavy lifting or straining until approved by your doctor. You may drive when you are no longer taking prescription pain medication, you can comfortably wear a seatbelt, and you can safely maneuver your car and apply brakes. RETURN TO WORK:  __________________________________________________________ You should see your doctor in the office for a follow-up appointment approximately 2-3 weeks after your surgery.  Make sure that you call for this appointment within a day or two after you arrive home to insure a convenient appointment time. OTHER INSTRUCTIONS: __________________________________________________________________________________________________________________________ __________________________________________________________________________________________________________________________ WHEN TO CALL YOUR DOCTOR: Fever over 101.0 Inability to urinate Continued bleeding from incision. Increased pain, redness, or drainage from the incision. Increasing abdominal pain  The clinic staff is available to answer your questions during regular business hours.  Please don't hesitate to call and ask to speak to one of the nurses for clinical concerns.  If you have a medical emergency, go to the nearest emergency room or call 911.  A surgeon from [facility] is always on call at the hospital. [address], [address], [place], [place]  [address] ? [address] [address], [place], [place]   [address] [phone] ? [phone] ? FAX [phone] Web site: [email or web address]  

## 2021-09-08 NOTE — Op Note (Signed)
Laparoscopic Cholecystectomy Procedure Note ? ?Indications: This patient presents with symptomatic gallbladder disease and will undergo laparoscopic cholecystectomy.The procedure has been discussed with the patient. Operative and non operative treatments have been discussed. Risks of surgery include bleeding, infection,  Common bile duct injury,  Injury to the stomach,liver, colon,small intestine, abdominal wall,  Diaphragm,  Major blood vessels,  And the need for an open procedure.  Other risks include worsening of medical problems, death,  DVT and pulmonary embolism, and cardiovascular events.   Medical options have also been discussed. The patient has been informed of long term expectations of surgery and non surgical options,  The patient agrees to proceed.    ? ?Pre-operative Diagnosis: Calculus of gallbladder with out cholecystitis, without mention of obstruction ? ?Post-operative Diagnosis: Same ? ?Surgeon: Turner Daniels  MD ? ?Assistants: Dr William Hamburger MD  ? ? ? ? ?I was personally present during the key and critical portions of this procedure and immediately available throughout the entire procedure, as documented in my operative note.  ? ? ? ? ? ?Anesthesia: General endotracheal anesthesia and Local anesthesia 0.25.% bupivacaine ? ?ASA Class: 2 ? ?Procedure Details  ?The patient was seen again in the Holding Room. The risks, benefits, complications, treatment options, and expected outcomes were discussed with the patient. The possibilities of reaction to medication, pulmonary aspiration, perforation of viscus, bleeding, recurrent infection, finding a normal gallbladder, the need for additional procedures, failure to diagnose a condition, the possible need to convert to an open procedure, and creating a complication requiring transfusion or operation were discussed with the patient. The patient and/or family concurred with the proposed plan, giving informed consent. The site of surgery properly noted/marked.  The patient was taken to Operating Room, identified as Michelene Gardener and the procedure verified as Laparoscopic Cholecystectomy with Intraoperative Cholangiograms. A Time Out was held and the above information confirmed. ? ?Prior to the induction of general anesthesia, antibiotic prophylaxis was administered. General endotracheal anesthesia was then administered and tolerated well. After the induction, the abdomen was prepped in the usual sterile fashion. The patient was positioned in the supine position with the left arm comfortably tucked, along with some reverse Trendelenburg. ? ?Local anesthetic agent was injected into the skin near the umbilicus and an incision made. The midline fascia was incised and the Hasson technique was used to introduce a 12 mm port under direct vision. It was secured with a figure of eight Vicryl suture placed in the usual fashion. Pneumoperitoneum was then created with CO2 and tolerated well without any adverse changes in the patient's vital signs. Additional trocars were introduced under direct vision with an 11 mm trocar in the epigastrium and 2 5 mm trocars in the right upper quadrant. All skin incisions were infiltrated with a local anesthetic agent before making the incision and placing the trocars.  ? ?The gallbladder was identified, the fundus grasped and retracted cephalad. Adhesions were lysed bluntly and with the electrocautery where indicated, taking care not to injure any adjacent organs or viscus. The infundibulum was grasped and retracted laterally, exposing the peritoneum overlying the triangle of Calot. This was then divided and exposed in a blunt fashion. The cystic duct was clearly identified and bluntly dissected circumferentially. The junctions of the gallbladder, cystic duct and common bile duct were clearly identified prior to the division of any linear structure.  ? ?The cystic duct was very small and not able to accommodate a catheter.  Her labs were norma  and we could  visualize the common bile duct and it was out of the operative field. Her U/S showed no CBD dilation. The critical view was obtained.  ? ?The cystic duct was then  ligated with surgical clips  on the patient side and  clipped on the gallbladder side and divided. The cystic artery was identified, dissected free, ligated with clips and divided as well. Posterior cystic artery clipped and divided. ? ?The gallbladder was dissected from the liver bed in retrograde fashion with the electrocautery. The gallbladder was removed. The liver bed was irrigated and inspected. Hemostasis was achieved with the electrocautery.  ? ? ?Of note there was a 1 cm capsular defect in the liver that was oozing adjacent to the gallbladder fossa.  Bleeding was minimal but this was cauterize and Arista applied with good hemostasis.  Arista also placed into the gallbladder fossa.  Copious irrigation was utilized  prior to this and was repeatedly aspirated until clear all particulate matter. Hemostasis was achieved with no signs  Of bleeding or bile leakage. ? ?Pneumoperitoneum was completely reduced after viewing removal of the trocars under direct vision. The wound was thoroughly irrigated and the fascia was then closed with a figure of eight suture; the skin was then closed with 4 0  MONOCRYL  and a sterile dressing  OF DERMABOND was applied. ? ?Instrument, sponge, and needle counts were correct at closure and at the conclusion of the case.  ? ?Findings: ?Cholecystitis with Cholelithiasis ? ?Estimated Blood Loss: less than 50 mL ?        ?Drains: none  ?        ?Total IV Fluids: per record  ?        ?Specimens: Gallbladder     ?      ?Complications: None; patient tolerated the procedure well. ?        ?Disposition: PACU - hemodynamically stable. ?        ?Condition: stable ?  ?

## 2021-09-08 NOTE — Progress Notes (Signed)
Patient arrived to [place] room 2 alert and oriented x4. Pain level 5/10. Bed in lowest position, call light in reach. Food ordered. Will continue to monitor patient. ?

## 2021-09-08 NOTE — Anesthesia Preprocedure Evaluation (Signed)
Anesthesia Evaluation  ?Patient identified by MRN, date of birth, ID band ?Patient awake ? ? ? ?Reviewed: ?Allergy & Precautions, H&P , NPO status , Patient's Chart, lab work & pertinent test results ? ?History of Anesthesia Complications ?(+) PONV and history of anesthetic complications ? ?Airway ?Mallampati: II ? ? ?Neck ROM: Limited ? ? ? Dental ? ?(+) Teeth Intact ?  ?Pulmonary ?neg pulmonary ROS, former smoker,  ?  ?breath sounds clear to auscultation ? ? ? ? ? ? Cardiovascular ?negative cardio ROS ? ? ?Rhythm:Regular Rate:Normal ? ? ?  ?Neuro/Psych ? Headaches, Anxiety   ? GI/Hepatic ?Neg liver ROS, GERD  ,Swallowing difficulties since acdf ?  ?Endo/Other  ?negative endocrine ROS ? Renal/GU ?negative Renal ROS  ? ?  ?Musculoskeletal ? ?(+) Arthritis , Osteoarthritis,   ? Abdominal ?  ?Peds ? Hematology ?negative hematology ROS ?(+)   ?Anesthesia Other Findings ? ? Reproductive/Obstetrics ? ?  ? ? ? ? ? ? ? ? ? ? ? ? ? ?  ?  ? ? ? ? ? ? ? ? ?Anesthesia Physical ? ?Anesthesia Plan ? ?ASA: II ? ?Anesthesia Plan: General  ? ?Post-op Pain Management:   ? ?Induction: Intravenous ? ?PONV Risk Score and Plan: 4 or greater and Ondansetron, Dexamethasone, Midazolam, Droperidol and Treatment may vary due to age or medical condition ? ?Airway Management Planned: Oral ETT ? ?Additional Equipment:  ? ?Intra-op Plan:  ? ?Post-operative Plan: Extubation in OR ? ?Informed Consent: I have reviewed the patients History and Physical, chart, labs and discussed the procedure including the risks, benefits and alternatives for the proposed anesthesia with the patient or authorized representative who has indicated his/her understanding and acceptance.  ? ? ? ?Dental advisory given and Dental Advisory Given ? ?Plan Discussed with: CRNA and Surgeon ? ?Anesthesia Plan Comments:   ? ? ? ? ? ? ?Anesthesia Quick Evaluation ? ?

## 2021-09-08 NOTE — Anesthesia Postprocedure Evaluation (Signed)
Anesthesia Post Note ? ?Patient: Yolanda Carroll ? ?Procedure(s) Performed: LAPAROSCOPIC CHOLECYSTECTOMY ? ?  ? ?Patient location during evaluation: PACU ?Anesthesia Type: General ?Level of consciousness: awake and alert ?Pain management: pain level controlled ?Vital Signs Assessment: post-procedure vital signs reviewed and stable ?Respiratory status: spontaneous breathing, nonlabored ventilation and respiratory function stable ?Cardiovascular status: blood pressure returned to baseline and stable ?Postop Assessment: no apparent nausea or vomiting ?Anesthetic complications: no ? ? ?No notable events documented. ? ?Last Vitals:  ?Vitals:  ? 09/08/21 1330 09/08/21 1400  ?BP: 122/62 (!) 124/55  ?Pulse: 84 81  ?Resp: 12 13  ?Temp:    ?SpO2: 96% 96%  ?  ?Last Pain:  ?Vitals:  ? 09/08/21 1400  ?TempSrc:   ?PainSc: Asleep  ? ? ?  ?  ?  ?  ?  ?  ? ?Lynda Rainwater ? ? ? ? ?

## 2021-09-08 NOTE — Interval H&P Note (Signed)
History and Physical Interval Note: ? ?09/08/2021 ?10:51 AM ? ?Yolanda Carroll  has presented today for surgery, with the diagnosis of GALLSTONES.  The various methods of treatment have been discussed with the patient and family. After consideration of risks, benefits and other options for treatment, the patient has consented to  Procedure(s): ?LAPAROSCOPIC CHOLECYSTECTOMY (N/A) ?INTRAOPERATIVE CHOLANGIOGRAM (N/A) as a surgical intervention.  The patient's history has been reviewed, patient examined, no change in status, stable for surgery.  I have reviewed the patient's chart and labs.  Questions were answered to the patient's satisfaction.   ? ? ?Evangelina Delancey A Ike Maragh ? ? ?

## 2021-09-08 NOTE — Anesthesia Procedure Notes (Addendum)
Procedure Name: Intubation ?Date/Time: 09/08/2021 11:38 AM ?Performed by: Michele Rockers, CRNA ?Pre-anesthesia Checklist: Patient identified, Emergency Drugs available, Suction available and Patient being monitored ?Patient Re-evaluated:Patient Re-evaluated prior to induction ?Oxygen Delivery Method: Circle system utilized ?Preoxygenation: Pre-oxygenation with 100% oxygen ?Induction Type: IV induction ?Ventilation: Mask ventilation without difficulty and Oral airway inserted - appropriate to patient size ?Laryngoscope Size: Sabra Heck and 2 ?Grade View: Grade I ?Tube type: Oral ?Tube size: 6.5 mm ?Number of attempts: 1 ?Airway Equipment and Method: Stylet and Oral airway ?Placement Confirmation: ETT inserted through vocal cords under direct vision, positive ETCO2 and breath sounds checked- equal and bilateral ?Secured at: 22 cm ?Tube secured with: Tape ?Dental Injury: Teeth and Oropharynx as per pre-operative assessment  ? ? ? ? ?

## 2021-09-09 ENCOUNTER — Encounter (HOSPITAL_COMMUNITY): Payer: Self-pay | Admitting: Surgery

## 2021-09-09 DIAGNOSIS — K8010 Calculus of gallbladder with chronic cholecystitis without obstruction: Secondary | ICD-10-CM | POA: Diagnosis not present

## 2021-09-09 LAB — SURGICAL PATHOLOGY

## 2021-09-09 NOTE — Progress Notes (Signed)
Mobility Specialist Progress Note: ? ? 09/09/21 1047  ?Mobility  ?Activity Ambulated with assistance in hallway  ?Level of Assistance Independent  ?Assistive Device None  ?Distance Ambulated (ft) 1120 ft  ?Activity Response Tolerated well  ?$Mobility charge 1 Mobility  ? ?Pt received up in room willing to participate in mobility. Complaints of 4/10 abdominal pain. Left in room with call bell in reach and all needs met.  ? ?Yolanda Carroll ?Mobility Specialist ?Primary Phone [phone] ? ?

## 2021-09-09 NOTE — TOC CM/SW Note (Signed)
?  Transition of Care (TOC) Screening Note ? ? ?Patient Details  ?Name: Yolanda Carroll ?Date of Birth: 19-Jun-1955 ? ? ? ? ? ? ?Transition of Care Department [facility]) has reviewed patient and no TOC needs have been identified at this time. We will continue to monitor patient advancement through interdisciplinary progression rounds. If new patient transition needs arise, please place a TOC consult. ?  ?

## 2021-09-09 NOTE — Discharge Summary (Signed)
Physician Discharge Summary  ?Patient ID: ?ALYNAH SCHONE ?MRN: [id number] ?DOB/AGE: Nov 03, 1955 66 y.o. ? ?Admit date: 09/08/2021 ?Discharge date: 09/09/2021 ? ?Admission Diagnoses:gallstones/ dyskinesia ? ?Discharge Diagnoses:  ?Same  ? ?Discharged Condition: good ? ?Hospital Course:  Pt did well post o from laparoscopic cholecystectomy. She had adequate pain control and was tolerating her diet. ? ? ? ? ? ?Treatments: surgery: Laparoscopic cholecystectomy  ? ?Discharge Exam: ?Blood pressure (!) 95/43, pulse 63, temperature 98.5 ?F (36.9 ?C), temperature source Oral, resp. rate 17, height '5\' 8"'$  (1.727 m), weight 52.6 kg, SpO2 96 %. ?General appearance: alert and cooperative ?Resp: clear to auscultation bilaterally ?Cardio: regular rate and rhythm ?Incision/Wound: port sites CDI appropriately tender  ? ?Disposition: home ? ?Discharge Instructions   ? ? Diet - low sodium heart healthy   Complete by: As directed ?  ? Increase activity slowly   Complete by: As directed ?  ? ?  ? ? ? ? ?Signed: ?Turner Daniels MD  ?09/09/2021, 8:39 AM ? ? ?

## 2021-09-14 ENCOUNTER — Ambulatory Visit: Payer: Medicare Other | Admitting: Obstetrics & Gynecology

## 2021-09-20 ENCOUNTER — Ambulatory Visit: Payer: PRIVATE HEALTH INSURANCE | Admitting: Sports Medicine

## 2021-09-20 ENCOUNTER — Encounter: Payer: Self-pay | Admitting: Internal Medicine

## 2021-09-23 ENCOUNTER — Ambulatory Visit: Payer: Medicare Other | Admitting: Obstetrics & Gynecology

## 2021-09-27 ENCOUNTER — Ambulatory Visit (INDEPENDENT_AMBULATORY_CARE_PROVIDER_SITE_OTHER): Payer: Medicare Other | Admitting: Psychiatry

## 2021-09-27 ENCOUNTER — Encounter: Payer: Self-pay | Admitting: Psychiatry

## 2021-09-27 DIAGNOSIS — F410 Panic disorder [episodic paroxysmal anxiety] without agoraphobia: Secondary | ICD-10-CM | POA: Diagnosis not present

## 2021-09-27 DIAGNOSIS — F331 Major depressive disorder, recurrent, moderate: Secondary | ICD-10-CM | POA: Diagnosis not present

## 2021-09-27 DIAGNOSIS — R110 Nausea: Secondary | ICD-10-CM

## 2021-09-27 DIAGNOSIS — F5105 Insomnia due to other mental disorder: Secondary | ICD-10-CM | POA: Diagnosis not present

## 2021-09-27 DIAGNOSIS — F411 Generalized anxiety disorder: Secondary | ICD-10-CM | POA: Diagnosis not present

## 2021-09-27 DIAGNOSIS — G43009 Migraine without aura, not intractable, without status migrainosus: Secondary | ICD-10-CM

## 2021-09-27 NOTE — Progress Notes (Signed)
Yolanda Carroll ?[id number] ?1956/03/13 ?66 y.o. ? ? ?Subjective:  ? ?Patient ID:  Yolanda Carroll is a 66 y.o. (DOB 1955/10/21) female. ? ?Chief Complaint:  ?Chief Complaint  ?Patient presents with  ? Follow-up  ? Depression  ? Anxiety  ? ? ?Anxiety ?Symptoms include nausea and nervous/anxious behavior. Patient reports no confusion, decreased concentration or suicidal ideas.  ? ? ?Yolanda Carroll presents to the office today for follow-up of anxiety and migraine. ? ?seen in July 2020.  No meds were changed.  She requires name brand Depakote because the generic caused worsening headaches. ? ?Retired November 14, 2018.  Was so burned out at work.  Helping with church projects and helping friends.  Focusing on her health and doing PT for back and hip.  No kids or gkids.  Helps care for 50 yo M-in-law.  H is also busy which helps.  Given a surprise retirement party.    ? ?seen January 2021.  Nefazodone is no longer manufactured and she had to wean off.  We also discussed the potential of weaning off Depakote because Aimovig had been very effective at managing her migraine headache. ? ?She called August 11, 2019 stating that she did taper off of Depakote because she did not feel like she needed it any more.  Her last dose was July 23, 2019 but afterwards noticed heartburn and swallowing issues.  Her GI doctor added Pepcid 40 mg daily.  Her neurologist had indicated that Depakote can sometimes help with esophageal issues and she wondered if that was connected.  She was given the option to restart a lower dose and perhaps taper more slowly.  Off Depakote for a month.  She elected not to restart it. ? ?08/19/2019 appointment the following is noted: ?Tinnitus and mild anxiety worse off the Depakote and with some mind racing esp in the AM.   Most is better. Reflux is worse off Depakote ER 250 and wondering if she should restart it.   ?Disc article on Depakote helping GERD by increasing lower esophageal sphincter tone. ?Has  travelled some with family events.  M-in-law 33 soon.   ?HA good with Aimovig. ?Don't do season changes well and more HA in Spring often. ?Sleep not great lately with leg cramps.  Started more exercise.  Started snoring and H is a light sleeper.  Meds help. ?Plan no med changes. ? ?10/03/2019 phone call with patient asking to restart residual nefazodone that she has on hand.  She felt that it helped back pain and she is experiencing more back pain recently.  She is aware that it is no longer manufactured to our knowledge and once she runs out it will not be available.  She indicated she had an off to take half of 150 mg nefazodone tablet for about 2 to 3 months and would like to do so. ? ?11/05/2019 appointment with the following noted:  Appt moved earlier DT the following. ?Had stopped Depakote and nefazadone and reflux got worse.  Restarting Depakote didn't help much.  Restarted nefazodaone and reflux better right away.  May not need Depakote but doesn't want to change.  ?Can't exercise DT back pain since Jan ans worse since March.  Getting new doctor.  ?More depressed and anxious and sleep problems DT back pain.   ?Usually only 0.25 mg alprazolam at night. ?Plan: has to stop nefazodone when it runs out bc no longer manufactured.  Therefore will try trazodone in it's place. ? ?01/07/20 appt with the  following noted: ?She and H both got Covid.  Was pretty sick for 10 days.  Lost taste.  Had little resp stuff but severe diarrhea and nausea and couldn't keep fluids down.  No residual sx. ?H had resp sx with pneumonia and had to get O2.  Hosp for 3 days. ?Still have some nefazodone and nursing it along. ?Tried trazodone 50 HS for 2 nights and didn't sleep well and had a HA  ?Nefazodone and Depakote both helped the GERD.  She has enough nefazadone for about 6 weeks. ?Plan: Nefazodone helped the reflux and she wants to take it as long as possible.  She didn't have withdrawal off nefazodone. When she runs out then start  trazodone.  If that fails then use Viibryd.  Disc SE and alternatives.   ?Rec try trazodone again but use 100 mg and see if sleeping better prevents the AM HA.  If HA recurs then viibryd. Starter kit.  ?Depakote didn't help as much as expected and she might stop it.  ? ?02/18/2020 phone call: Lajuanna called to report that she is experiencing high anxiety.  It is causing chest discomfort and breathing problems. Has appt 11/18 and is on the wait list but needs to discuss how to relief this anxiety.  Please call. ?Response: Note ?Rtc to patient, she does have the [place] and does remember that discussion. She has not been able to start that because she received a steroid injection a couple weeks ago and caused her stomach to be upset. She didn't want to start it knowing that's a side effect. She feels like the steroid injection may have worsened all her symptoms. She did try the trazodone but it was too much for her, even taking a 1/2 tablet. She does have some serzone left and has been taking that until this improves. Also taking Xanax as needed during day if needed. Hoping all of this will improve soon. ?Informed her I would update Dr. Clovis Pu with her information.  ?   ?  ? ?04/01/2020 appointment with the following noted: ?Out of nefazodone a few weeks ago.  More reflux off it.  Tried trazodone a couple of times.  Tried 100 mg trazodone and heart skipped.  50 mg awoke with chest tightness.  Long haul Covid with GI sx.  Nausea and anxiety problems daily since mid September.  Covid early August and got monoclonal infusion which helped.  Headed to long haul Covid clinic in [place] Monday.   ?Needed to increase alprazolam to BID.  And 80% better with that.  Panic some out of bed.  Sx started after steroid shot for her back 9/21.   ?Hasn't tried Viibryd DT fear of GI worsening.  Poor appetite and lost 15#. ?Not taken tramadol yet. ?Migraine under control. ?Plan: no med changes ? ?06/01/20 appt with following noted: ?Starting  generic Depakote ER generic today. ?Started Mirtazapine 3.75 mg and it worked right away for nausea for a week.  Then increased to 7.5 mg HS.  Most days is feeling really well.  No SE except mild drowsiness. ?Still has post Covid problems with stomach but much better.  Regained 7# of needed weight.  Need to try to gain 10# more. ?Zofran failed. ?Had a lot anxiety with N but it's better now.  Was waking with panic and no longer N and anxiety better too. ?Severe scoliosis facing big surgery in summer with long recovery.  Can't ride in car for long. ?Plan: No med changes ? ?08/18/2020 appointment  with the following noted: ?Gained 12# and thankful for mirtazapine. ?Long Covid GI sx.  Better not gone with mirtazapine. ?Rare AM panic.  Sleep good. ?Scoliosis and may require surgery. Can't sit or stand for long ?Depakote brand ER 250 helps heartburn. Generic failed. ?Patient denies difficulty with sleep initiation with Xanax.. Denies appetite disturbance.  Patient reports that energy and motivation have been good.  Patient denies any difficulty with concentration.  Patient denies any suicidal ideation. ?More depressed and tearful with pain but varies with pain levels.  Good and bad days.  Not too depressed with anxiety worse. ?Plan: Continue mirtazapine 7.5 mg with Xanax for GI problems including nausea and appetite.  It has worked some.  Try higher dose. ?Insomnia managed with  With alprazolam.   ? ?11/18/2020 appointment with the following noted: ?Increased mirtazapine to 7.5 mg 1 and 1/2 tablet bc  less anxious with it. ?Facing big back surgery for severe scoliosis 11/30/20.  Can't drive in car for distance or travel. ?Now on Medicare. ?Depakote ER BRAND helped GI problems which were worse on generic. ?Not markedly depressed.  Patient reports stable mood and denies depressed or irritable moods.   Patient denies difficulty with sleep initiation or maintenance. Denies appetite disturbance.  Patient reports that energy and  motivation have been good.  Patient denies any difficulty with concentration.  Patient denies any suicidal ideation. ?Plan: No med changes ?Continue Depakote brand ER 250 mg daily, continue alprazolam 0.25 mg nightl

## 2021-09-29 ENCOUNTER — Ambulatory Visit: Payer: PRIVATE HEALTH INSURANCE | Admitting: Sports Medicine

## 2021-09-30 ENCOUNTER — Other Ambulatory Visit: Payer: Self-pay | Admitting: Gastroenterology

## 2021-09-30 DIAGNOSIS — R634 Abnormal weight loss: Secondary | ICD-10-CM

## 2021-09-30 DIAGNOSIS — R110 Nausea: Secondary | ICD-10-CM

## 2021-10-03 ENCOUNTER — Ambulatory Visit (INDEPENDENT_AMBULATORY_CARE_PROVIDER_SITE_OTHER): Payer: Medicare Other | Admitting: Obstetrics & Gynecology

## 2021-10-03 ENCOUNTER — Encounter: Payer: Self-pay | Admitting: Obstetrics & Gynecology

## 2021-10-03 VITALS — Ht 66.0 in | Wt 118.0 lb

## 2021-10-03 DIAGNOSIS — Z780 Asymptomatic menopausal state: Secondary | ICD-10-CM | POA: Diagnosis not present

## 2021-10-03 DIAGNOSIS — M810 Age-related osteoporosis without current pathological fracture: Secondary | ICD-10-CM | POA: Diagnosis not present

## 2021-10-03 DIAGNOSIS — N952 Postmenopausal atrophic vaginitis: Secondary | ICD-10-CM

## 2021-10-03 DIAGNOSIS — Z9189 Other specified personal risk factors, not elsewhere classified: Secondary | ICD-10-CM | POA: Diagnosis not present

## 2021-10-03 DIAGNOSIS — Z01419 Encounter for gynecological examination (general) (routine) without abnormal findings: Secondary | ICD-10-CM

## 2021-10-03 MED ORDER — PREMARIN 0.625 MG/GM VA CREA: 1.0000 | TOPICAL_CREAM | VAGINAL | 6 refills | Status: DC

## 2021-10-03 NOTE — Progress Notes (Signed)
Yolanda Carroll 09/21/1955 [id number]   History:    66 y.o. G0 Married   RP:  Established patient presenting for annual gyn exam    HPI: Postmenopausal, well on no systemic hormone replacement therapy.  No postmenopausal bleeding.  No pelvic pain.  Pap Neg 08/2020.  No h/o abnormal Pap. Stopped Premarin cream because was not sexually active after back surgery and Cholecystectomy, but would like to restart now.  Breasts normal.  Mammo 02/2021 Neg.  Body mass index 19.05.  Physically active. Bone Density 10/2020 Osteoporosis T-Score -2.8 at the Rt Femoral Neck.  Stable at bilateral hips and significantly improved at the Forearm on Prolia.  Health labs with family physician.  Colonoscopy 2016.  Past medical history,surgical history, family history and social history were all reviewed and documented in the EPIC chart.  Gynecologic History No LMP recorded. Patient is postmenopausal.  Obstetric History OB History  Gravida Para Term Preterm AB Living  0 0 0 0 0 0  SAB IAB Ectopic Multiple Live Births  0 0 0 0 0     ROS: A ROS was performed and pertinent positives and negatives are included in the history.  GENERAL: No fevers or chills. HEENT: No change in vision, no earache, sore throat or sinus congestion. NECK: No pain or stiffness. CARDIOVASCULAR: No chest pain or pressure. No palpitations. PULMONARY: No shortness of breath, cough or wheeze. GASTROINTESTINAL: No abdominal pain, nausea, vomiting or diarrhea, melena or bright red blood per rectum. GENITOURINARY: No urinary frequency, urgency, hesitancy or dysuria. MUSCULOSKELETAL: No joint or muscle pain, no back pain, no recent trauma. DERMATOLOGIC: No rash, no itching, no lesions. ENDOCRINE: No polyuria, polydipsia, no heat or cold intolerance. No recent change in weight. HEMATOLOGICAL: No anemia or easy bruising or bleeding. NEUROLOGIC: No headache, seizures, numbness, tingling or weakness. PSYCHIATRIC: No depression, no loss of interest in  normal activity or change in sleep pattern.     Exam:   Ht '5\' 6"'$  (1.676 m)   Wt 118 lb (53.5 kg)   BMI 19.05 kg/m   Body mass index is 19.05 kg/m.  General appearance : Well developed well nourished female. No acute distress HEENT: Eyes: no retinal hemorrhage or exudates,  Neck supple, trachea midline, no carotid bruits, no thyroidmegaly Lungs: Clear to auscultation, no rhonchi or wheezes, or rib retractions  Heart: Regular rate and rhythm, no murmurs or gallops Breast:Examined in sitting and supine position were symmetrical in appearance, no palpable masses or tenderness,  no skin retraction, no nipple inversion, no nipple discharge, no skin discoloration, no axillary or supraclavicular lymphadenopathy Abdomen: no palpable masses or tenderness, no rebound or guarding Extremities: no edema or skin discoloration or tenderness  Pelvic: Vulva: Normal             Vagina: No gross lesions or discharge  Cervix: Normal.             Uterus:  Anteverted, normal volume, mobile, NT  Adnexa  Without masses or tenderness  Anus: Normal   Assessment/Plan:  66 y.o. female for annual exam   1. Well female exam with routine gynecological exam Postmenopausal, well on no systemic hormone replacement therapy.  No postmenopausal bleeding.  No pelvic pain.  Pap Neg 08/2020.  No h/o abnormal Pap. Stopped Premarin cream because was not sexually active after back surgery and Cholecystectomy, but would like to restart now.  Breasts normal.  Mammo 02/2021 Neg.  Body mass index 19.05.  Physically active. Bone Density 10/2020 Osteoporosis  T-Score -2.8 at the Rt Femoral Neck.  Stable at bilateral hips and significantly improved at the Forearm on Prolia.  Health labs with family physician.  Colonoscopy 2016.  2. Postmenopause Postmenopausal, well on no systemic hormone replacement therapy.  No postmenopausal bleeding.  No pelvic pain.  3. Postmenopausal atrophic vaginitis Stopped Premarin cream because was not  sexually active after back surgery and Cholecystectomy, but would like to restart now.  No CI to restart Premarin cream.  Recommendations on usage given.  Prescription sent to pharmacy.  4. Age-related osteoporosis without current pathological fracture Bone Density 10/2020 Osteoporosis T-Score -2.8 at the Rt Femoral Neck.  Stable at bilateral hips and significantly improved at the Forearm on Prolia.  Continue on Prolia.  Vit D, Ca++, wt bearing physical activities.  5. Other specified personal risk factors, not elsewhere classified  Other orders - conjugated estrogens (PREMARIN) vaginal cream; Place 1 Applicatorful vaginally 2 (two) times a week.   Princess Bruins MD, 2:17 PM 10/03/2021

## 2021-10-13 ENCOUNTER — Other Ambulatory Visit: Payer: PRIVATE HEALTH INSURANCE

## 2021-10-15 NOTE — Telephone Encounter (Signed)
Prolia VOB initiated via [email or web address]  Last OV:  Next OV:  Last Prolia inj 05/31/21 Next Prolia nj due 11/29/21

## 2021-10-17 ENCOUNTER — Ambulatory Visit: Payer: Self-pay | Admitting: Internal Medicine

## 2021-10-19 ENCOUNTER — Ambulatory Visit (INDEPENDENT_AMBULATORY_CARE_PROVIDER_SITE_OTHER): Payer: Medicare Other | Admitting: Internal Medicine

## 2021-10-19 ENCOUNTER — Encounter: Payer: Self-pay | Admitting: Internal Medicine

## 2021-10-19 VITALS — BP 100/64 | HR 71 | Ht 66.0 in | Wt 115.8 lb

## 2021-10-19 DIAGNOSIS — Z713 Dietary counseling and surveillance: Secondary | ICD-10-CM | POA: Diagnosis not present

## 2021-10-19 DIAGNOSIS — M810 Age-related osteoporosis without current pathological fracture: Secondary | ICD-10-CM | POA: Diagnosis not present

## 2021-10-19 NOTE — Progress Notes (Signed)
Patient ID: VEE BAHE, female   DOB: 1955/12/18, 66 y.o.   MRN: [id number]   HPI  Yolanda Carroll is a 67 y.o.-year-old female, initially referred by Dr. Phineas Real, presenting for follow-up for osteoporosis.  Last visit 1 year ago.  Interim history: She has anxiety and sees Dr. Clovis Pu. Before last visit she had steroid injections in back due to significant back pain.  She had  T4-ilium fusion surgery in 12/01/2020 by Dr. Margarita Mail in [place].Marland Kitchen  She was in physical therapy afterwards.  She feels better after the sx, with still some pain. She is exercising - water aerobics, walking at home. She denies dizziness/orthostasis/poor vision.   She had cholecystectomy since last OV (04./2023) >> nausea. Lost 7 lbs since last OV. Tried the [employer]. Now nausea is a little better and eating a little more. She will also see nutrition. She fell on the bed recently >> no fracture.  Reviewed history: She was diagnosed with osteoporosis in early 2000.  Reviewed patient's DXA scan reports: Date L1-L4 T score FN T score 33% distal Radius (left)  Ultra distal radius (left)  10/19/2020 ([facility]) N/a RFN: -2.8 (+5%) LFN: -2.5 (-1.4%) +0.4 (+4%) N/a  02/11/2019 ([facility]) N/a due to scoliosis RFN: -3.1 (-8.2%*) LFN: -2.4 (-4.7%)  -0.1 (-9.6%*)  0.0  02/06/2017 ([facility]) -0.3 (moderate scoliosis) RFN: -2.6 LFN: -2.2  +1.1 -0.1  12/29/2014 -0.5 (moderate scoliosis) RFN: -2.6 LFN: -1.7 n/a n/a  12/12/2012 -0.5 (moderate scoliosis) RFN: -2.4 LFN: -1.9 n/a n/a  10/04/2010 n/a RFN: -2.3 LFN: -1.8 n/a n/a   She has a history of steroid use: P.o. and IM for headaches, intraspinal for OA.  No fractures.   She had one episode of vertigo for 2 weeks in 2018.   Reviewed previous osteoporosis treatments: - Actonel in 2006-2007 - Forteo for 2 years in 2008 - Boniva in 2010 - HRT: Estradiol 0.5, Provera 2.5 - now off - Prolia -05/02/2019, 11/11/2019, 05/13/2020, 11/16/2020, 05/31/2021  No  history of vitamin D deficiency: 02/10/2021: Vitamin D 65.2 Lab Results  Component Value Date   VD25OH 75.3 11/11/2020   VD25OH 49.3 12/04/2019   VD25OH 70.27 03/04/2019   VD25OH 55 03/13/2017  02/02/2020: Vitamin D 68.8 02/11/2018: Vit D 61.9 04/30/2017: vit D 53.4  She is on: - calcium citrate >> 250 mg 1-2 a day >> 500-600 mg daily >> off  - Vitamin D >> approximately 2000 units daily (15,000 units a week) >> just started back, was off for 3 mo  No weightbearing exercises.   She is not taking high vitamin A doses.  Menopause was at 41s y/o.   Pt does have a FH of osteoporosis in mother, who had a hip fracture.  No history of kidney stones or hyperparathyroidism: Lab Results  Component Value Date   CALCIUM 9.4 08/31/2021   CALCIUM 9.4 03/26/2020   CALCIUM 8.4 (L) 12/22/2019   CALCIUM 8.3 (L) 12/21/2019   CALCIUM 9.4 12/04/2019   CALCIUM 9.5 03/04/2019   CALCIUM 9.5 02/21/2012   CALCIUM 9.8 07/10/2011  02/02/2020: corrected calcium 8.74 (8.6-10.3) 02/11/2018: Ca normal  No history of thyrotoxicosis: 02/10/2021: TSH 3.9 02/02/2020: TSH 2.53 02/11/2018: TSH 2.79 05/01/2017: TSH 1.71 04/28/2013: TSH 2.063 No results found for: TSH   No CKD. Last BUN/Cr: Lab Results  Component Value Date   BUN 7 (L) 08/31/2021   CREATININE 0.73 08/31/2021   On Depakote for migraines. On opioids for back pain. She sees pain management.   ROS: +  See HPI Musculoskeletal: + muscle aches/+ joint aches  I reviewed pt's medications, allergies, PMH, social hx, family hx, and changes were documented in the history of present illness. Otherwise, unchanged from my initial visit note.  Past Medical History:  Diagnosis Date   Allergy    Anxiety    Arthritis    Cervical dysplasia    Endometriosis    Fatigue    GERD (gastroesophageal reflux disease)    History of hiatal hernia    pt states she currently has a hiatal hernia   IBS (irritable bowel syndrome)    Migraine    Osteopenia     Osteoporosis 01/2019   T score -3.1   Osteoporosis    Plantar fasciitis    PONV (postoperative nausea and vomiting)    also difficult to wake up   Recurrent vaginitis    Reflux    Scoliosis    Wears glasses    Past Surgical History:  Procedure Laterality Date   ANTERIOR CERVICAL DECOMP/DISCECTOMY FUSION  04/17/2011   Procedure: ANTERIOR CERVICAL DECOMPRESSION/DISCECTOMY FUSION 2 LEVELS;  Surgeon: Hosie Spangle;  Location: [facility];  Service: Neurosurgery;  Laterality: N/A;  Cervical five-six, six-seven anterior cervical decompression with fusion,  plating,  and bonegraft    APPENDECTOMY  1978   BACK SURGERY N/A    BREAST BIOPSY  07/13/2011   Procedure: BREAST BIOPSY WITH NEEDLE LOCALIZATION;  Surgeon: Rolm Bookbinder, MD;  Location: [facility];  Service: General;  Laterality: Right;  Right breast wire localization biopsy   BREAST EXCISIONAL BIOPSY Right 2013   BREAST SURGERY     Breast Bx-Benign   CHOLECYSTECTOMY N/A 09/08/2021   Procedure: LAPAROSCOPIC CHOLECYSTECTOMY;  Surgeon: Erroll Luna, MD;  Location: [facility];  Service: General;  Laterality: N/A;   [place]  08/02/1995   RIH   LAPAROSCOPIC ENDOMETRIOSIS FULGURATION  1997   PELVIC LAPAROSCOPY     ROTATOR CUFF REPAIR     right 2002 left 2000   Social History   Socioeconomic History   Marital status: Married    Spouse name: Fritz Pickerel   Number of children: 0   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    Caffeine- very little.   Current Outpatient Medications on File Prior to Visit  Medication Sig Dispense Refill   AIMOVIG 140 MG/ML SOAJ INJECT 140 MG INTO SKIN EVERY 30 DAYS 3 mL 3   ALPRAZolam (XANAX) 0.25 MG tablet Take 1 tablet (0.25 mg total) by mouth 3 (three) times daily as needed for anxiety. (Patient taking differently: Take 0.25 mg by mouth 2 (two) times daily.) 270 tablet 0  Ascorbic Acid (VITAMIN C PO) Take 500 mg by mouth daily.     Calcium Carbonate (CALCIUM 600 PO) Take 600-1,200 mg by mouth daily.     cholecalciferol (VITAMIN D3) 25 MCG (1000 UNIT) tablet Take 1,000 Units by mouth daily.     conjugated estrogens (PREMARIN) vaginal cream Place 1 Applicatorful vaginally 2 (two) times a week. 42.5 g 6   denosumab (PROLIA) 60 MG/ML SOSY injection Inject 60 mg into the skin every 6 (six) months.     DEPAKOTE ER 250 MG 24 hr tablet Take 1 tablet (250 mg total) by mouth daily. (Patient taking differently: Take 250 mg by mouth at bedtime.) 90 tablet 3   gabapentin (NEURONTIN) 100 MG capsule Take 300 mg by mouth every evening.     HYDROcodone-acetaminophen (NORCO) 7.5-325 MG tablet Take 1.5 tablets by mouth every 6 (six) hours as needed for moderate pain.     Lactobacillus (ACIDOPHILUS) 100 MG CAPS Take 100 mg by mouth daily.     lansoprazole (PREVACID) 30 MG capsule Take 30 mg by mouth 2 (two) times daily before a meal.     methocarbamol (ROBAXIN) 750 MG tablet Take 750 mg by mouth 3 (three) times daily.     mirtazapine (REMERON) 7.5 MG tablet TAKE ONE AND ONE HALF TABLET BY MOUTH NIGHTLY 135 tablet 1   Multiple Vitamin (MULTIVITAMIN) tablet Take 1 tablet by mouth daily.     ondansetron (ZOFRAN) 4 MG tablet Take 1 tablet (4 mg total) by  mouth daily as needed for nausea or vomiting. 30 tablet 1   polyethylene glycol (MIRALAX / GLYCOLAX) 17 g packet Take 17 g by mouth daily.     Probiotic Product (ALIGN) 4 MG CAPS Take 4 mg by mouth daily.     vitamin E 180 MG (400 UNITS) capsule Take 400 Units by mouth daily. (Patient not taking: Reported on 10/03/2021)     No current facility-administered medications on file prior to visit.   Allergies  Allergen Reactions   Penicillins Other (See Comments)    Seizures as a child   Atarax [Hydroxyzine] Other (See Comments)    Headache, depression   Bactrim [Sulfamethoxazole-Trimethoprim] Nausea Only   Celexa [Citalopram Hydrobromide] Other (See Comments)    Chest pain   Erythromycin      Upset stomach   Flagyl [Metronidazole]     Dizzy and increased heart rate   Nortriptyline Itching   Orphenadrine     Hand tremors   Prilosec [Omeprazole]     Abdominal pain   Requip [Ropinirole]     Made sx worse   Darvocet [Propoxyphene N-Acetaminophen] Itching   Percocet [Oxycodone-Acetaminophen] Itching   Family History  Problem Relation Age of Onset   Cancer Father        lymphoma   Other Mother        bipolar,reflux   Bipolar disorder Mother    Other Brother        sinus problems   Cancer Maternal Aunt        uterine cancer   Breast cancer Maternal Aunt        40's   Diabetes Maternal Aunt    Cancer Paternal Aunt        Colon cancer   Breast cancer Cousin 80       Mat. 1st cousin   PE: BP 100/64 (BP Location: Right Arm, Patient Position: Sitting, Cuff Size: Normal)   Pulse 71   Ht '5\' 6"'$  (1.676 m)   Wt 115  lb 12.8 oz (52.5 kg)   SpO2 98%   BMI 18.69 kg/m  Wt Readings from Last 3 Encounters:  10/19/21 115 lb 12.8 oz (52.5 kg)  10/03/21 118 lb (53.5 kg)  09/08/21 116 lb (52.6 kg)   Constitutional:  Normal weight, in NAD Eyes: no exophthalmos ENT: moist mucous membranes, no thyromegaly, no cervical lymphadenopathy Cardiovascular: RRR, No MRG Respiratory: CTA  B Gastrointestinal: abdomen soft, NT, ND, BS+ Musculoskeletal: + NO scoliosis! Skin: moist, warm, no rashes Neurological: no tremor with outstretched hands  Assessment: 1. Osteoporosis  2.  Nutrition counseling  Plan: 1. Osteoporosis -Likely age-related + postmenopausal + she also has family history of osteoporosis -I reviewed her bone density reports from 01/2017 in 01/2019 and it appears that both her femoral neck T-scores have decreased while only the right femoral neck decrease was significant.  She also had bone mineral density decrease in her radius.  Her spine could not be analyzed in 2020 due to scoliosis.  After these results returned, she agreed to start Prolia.  Latest bone scan was from 10/19/2020 and this showed stability of her T-scores. -Her first Prolia injection was in 04/2019.  She had back pain, but this was chronic and we discussed that it was unlikely related to Prolia.  Since last visit, she was able to undergo scoliosis surgery, with good results.  She still has pain, but feels that this is improving. -She previously contemplating stopping osteoporosis medications but I dissuaded her from trying this since bone density drastically decreases after she misses an injection and her fracture risk greatly increases. -Since last visit, she was on schedule with her injections, on 11/16/2020, and on 05/31/2021.  She will be due for another injection in 11/2021. -Of note, she tolerates Prolia well, without hip/jaw/thigh pain -Latest vitamin D level was normal in 01/2021: 65.9. She was on 15,000 units of vitamin D weekly.  After her cholecystectomy surgery, her nausea and decreased appetite precluded her from taking the vitamin D consistently.  She just restarted.  Also, she was offered calcium supplement for the last 3 months and did not restart yet.  I strongly advised her to do so and amounts to approximately 1200 mg of calcium from diet +/-5 supplements -Calcium and kidney function  normal in 08/2021 -At last visit, she was not able to exercise due to back pain.  We discussed that her back surgery could help and she may start weightbearing exercises afterwards after she heals.  Now that she had her surgery in 11/2020, we discussed about slowly building up strength exercises.  She already restarting exercise at the pool and also walking. -For now, we will continue Prolia for at least 10 years -I will see her back in 1 year  2.  Nutrition counseling -At last visit, we again discussed about the benefits of an alkaline diet.  I recommended that she eat a more balanced diet.  She was telling me that she was using urine strips for her integrative medicine provider and they showed alkaline urine. -At last visit and again today we discussed about recommendations for calcium intake.  We discussed about trying to get approximately 1200 mg of calcium per day from the diet ideally, however, if she could not amount to this, to supplement with calcium citrate, but no more than 500-600 mg/day.  She needs to restart this, since her diet is not ideal for now. -She continues on 15,000 units vitamin D weekly (after being off for 3 months) with normal vitamin  D level at last visit -we will recheck a vitamin D at next visit  Philemon Kingdom, MD PhD [facility] Endocrinology

## 2021-10-19 NOTE — Patient Instructions (Addendum)
Below is our plan:  We will continue Amovig every 30 days and Nurtec as needed. Call me if updated prescription is needed.   Please make sure you are staying well hydrated. I recommend 50-60 ounces daily. Well balanced diet and regular exercise encouraged. Consistent sleep schedule with 6-8 hours recommended.   Please continue follow up with care team as directed.   Follow up with me in 1 year   You may receive a survey regarding today's visit. I encourage you to leave honest feed back as I do use this information to improve patient care. Thank you for seeing me today!

## 2021-10-19 NOTE — Patient Instructions (Addendum)
Please continue with Prolia as discussed.  Try to get 1200 mg calcium a day (diet +/- supplements).  Please come back for a follow-up appointment in 1 year.

## 2021-10-19 NOTE — Progress Notes (Signed)
Chief Complaint  Patient presents with   Follow-up    Rm 10, alone. Here for yearly migraine f/u. Doing well on Aimovig. Getting Aimovig from [employer] for free vs pharmacy. Has not had a migraines since Nov.     HISTORY OF PRESENT ILLNESS:  10/20/21 ALL:  Yolanda Carroll returns for follow up for migraines. She continues Amovig (PAP) and Nurtec. She reports doing very well. She has not had a migraine since 03/2021. She has not yet taken Nurtec but does have some just in case. She is having more nausea following cholecystectomy. She continues to recover from back surgery last July. She is exercising regularly. She feels mood is ok. She is disappointed that it taking her longer to heal. She is able to drive. She is followed closely by care team.   12/21/20 ALL (Mychart): Yolanda Carroll is a 66 y.o. female here today for follow up. She continues Amovig but reports that copay is too expensive. She has failed Ajovy. Discussed trial of Emgality at last visit, however, unsure if this will be affordable. She is doing very well on Amovig. She is not having any headache days. She has Nurtec but has not needed to take it.   She had posterior T4-ilium fusion on 7/19. She was discharged from rehab 12/17/2020. She is doing very well. She is doing home exercises and walking daily.   Meds tried: Amovig (on now), Depakote, Imitrex, baclofen, Nortriptyline, Gabapentin, Nefazodone, Skelaxin, ketaprofen  10/20/2020 ALL:  Yolanda Carroll is a 66 y.o. female here today for follow up for migraines. She continues Amovig and Nurtec. She reports that headaches have resolved. She has not had to take Nurtec. She can't remember when she last had a migraine. She tried to wean divalproex but had severe acid reflux. Psychiatry resumed divalproex. She is also taking Carafate. Since being diagnosed with Covid in 12/2019, she has had significant anxiety. She is being treated with alprazolam (dose was increased) and started  on mirtazapine.   HISTORY (copied from Dr Cathren Laine previous note)  Interval history 08/05/2019:  She is still doing well on Aimovig. She came off of the nefazodone. She came off the depakote and her migraines are stable but she was having other symptoms such as swallowing problems and maybe withdrawal symptoms, she is on pepcid now, she doesn't want to go back on them. She continues the xanax at bedtime and wants to stop that as well. [name] did not work. She has not had many headaches, she still has meclizine and she has taken that that aspirin and ibuprofen for mild headaches. She has not had any severe headaches this year, mild and responded to the meclizine combo above. Gave her samples of Nurtec.   Interval history 08/01/2018: She went back to [place] because it worked better. Ajovy didn't work, it wore off.  The aimovig gave her constipation but she is working with GI and feels better. Discussed other acute medications, [name] and others. Has had a difficult time with acute management. She feels tremendously improved.  In the past we have tried her on a combination of meds for acute management, she is tried multiple of them Cambia, Zofran, Relpax and other triptans, baclofen, tramadol, Fioricet, zembrace and Zomig and other triptan's.  Today we discussed some of the new medications such as Ubrogrepant and Lasmitidan.  I gave her samples of Ubrelvy today and she will get back to [place].   Interval History 01/29/2018; Tried her on a combination of meds  for acute management (cambia, zofran, relpax) was also taking imitrex and baclofen. Tramadol in hte past did not help. Tried Fioricet at last appointment, made her dizzy and made her itch. Discussed trying other medications. Discussed oral medications. She has them worse in the winter, they can last up to 7 days. Discussed trying Zembrace and Zomig nasal acutely and also starting Aimovig for the next 3 months as she has worsening migraines in winter will provide  samples and see how she does.   Interval history 03/12/2017: Tried a combination of medications, cambia, relpax and zofran. Cambia alone did not help. Zofran and relpax did not help when she got home. Took all three together later in the mirgaine didn;t help. But also tried it at onset of headache and did not stop the migraine. She had her last migraine at the end of August until the 17th of September. Then October 24th had another headache and the tylenol worked. Lasted a few days, baclofen for neck pain but did not stop it until the 26th-28th. 63 days has had 41 headache free days. In 2 months had 22 migraine days. Season may also trigger. She has tried imitrex PO. Tramadol shot in the past had not helped, migraine started on 6/27 and imitrex did not help but she had the headache for several days so this was not at onset. Tried cambia.    HPI:  Yolanda Carroll is a 66 y.o. female here as a referral from Dr. Stephanie Acre for migraines. She is currently on Depakote, meloxicam, Imitrex, baclofen. She has a past medical history of osteoporosis, plantar fasciitis, migraine, irritable bowel syndrome, fatigue, neck and back pain with degenerative disc disease and radiculopathy, arthritis. She has had migraines since 2000. Started worsening in October. They can last up to 2 weeks straight. She has associated vertigo. She has done well on nefazodone and depakote. The next headache was in June and lasted 2 weeks. It hurts behind the eyes, she can still function but is moderate in pain, weather triggers, she has light sensitivity, no significant nausea or vomiting. She has had nausea and vomiting in the past. Depakote is working for her. Slowly progressive when they start. No medication overuse. No other focal neurologic deficits, associated symptoms, inciting events or modifiable factors.   Meds tried: Depakote, Imitrex, baclofen, Nortriptyline, Gabapentin, Nefazodone, Skelaxin, ketaprofen,    Reviewed notes, labs and  imaging from outside physicians, which showe:    Personally reviewed imaging and agree with following   MRI cervical spine 07/2013: 1. Mild worsening of the foraminal impingement at C7-T1 due to progressive spondylosis and degenerative disc disease. 2. Mild left foraminal impingement at C3-4, C5-6, and C6-7 primarily due to spurring. Although there is some residual left foraminal impingement at the postoperative levels, the degree of impingement at these levels is much less than on the preoperative exam.    REVIEW OF SYSTEMS: Out of a complete 14 system review of symptoms, the patient complains only of the following symptoms, acid reflux, anxiety, back pain, and all other reviewed systems are negative.   ALLERGIES: Allergies  Allergen Reactions   Penicillins Other (See Comments)    Seizures as a child   Atarax [Hydroxyzine] Other (See Comments)    Headache, depression   Bactrim [Sulfamethoxazole-Trimethoprim] Nausea Only   Celexa [Citalopram Hydrobromide] Other (See Comments)    Chest pain   Erythromycin      Upset stomach   Flagyl [Metronidazole]     Dizzy and increased heart rate  Nortriptyline Itching   Orphenadrine     Hand tremors   Prilosec [Omeprazole]     Abdominal pain   Requip [Ropinirole]     Made sx worse   Darvocet [Propoxyphene N-Acetaminophen] Itching   Percocet [Oxycodone-Acetaminophen] Itching     HOME MEDICATIONS: Outpatient Medications Prior to Visit  Medication Sig Dispense Refill   AIMOVIG 140 MG/ML SOAJ INJECT 140 MG INTO SKIN EVERY 30 DAYS 3 mL 3   ALPRAZolam (XANAX) 0.25 MG tablet Take 1 tablet (0.25 mg total) by mouth 3 (three) times daily as needed for anxiety. (Patient taking differently: Take 0.25 mg by mouth 2 (two) times daily.) 270 tablet 0   Ascorbic Acid (VITAMIN C PO) Take 500 mg by mouth daily.     Calcium Carbonate (CALCIUM 600 PO) Take 600-1,200 mg by mouth daily.     cholecalciferol (VITAMIN D3) 25 MCG (1000 UNIT) tablet Take  1,000 Units by mouth daily.     conjugated estrogens (PREMARIN) vaginal cream Place 1 Applicatorful vaginally 2 (two) times a week. 42.5 g 6   denosumab (PROLIA) 60 MG/ML SOSY injection Inject 60 mg into the skin every 6 (six) months.     DEPAKOTE ER 250 MG 24 hr tablet Take 1 tablet (250 mg total) by mouth daily. (Patient taking differently: Take 250 mg by mouth at bedtime.) 90 tablet 3   gabapentin (NEURONTIN) 100 MG capsule Take 300 mg by mouth every evening.     HYDROcodone-acetaminophen (NORCO) 7.5-325 MG tablet Take 1.5 tablets by mouth every 6 (six) hours as needed for moderate pain.     Lactobacillus (ACIDOPHILUS) 100 MG CAPS Take 100 mg by mouth daily.     lansoprazole (PREVACID) 30 MG capsule Take 30 mg by mouth 2 (two) times daily before a meal.     methocarbamol (ROBAXIN) 750 MG tablet Take 750 mg by mouth 3 (three) times daily.     mirtazapine (REMERON) 7.5 MG tablet TAKE ONE AND ONE HALF TABLET BY MOUTH NIGHTLY (Patient taking differently: Take 2 tablets by mouth at bedtime.) 135 tablet 1   Multiple Vitamin (MULTIVITAMIN) tablet Take 1 tablet by mouth daily.     ondansetron (ZOFRAN) 4 MG tablet Take 1 tablet (4 mg total) by mouth daily as needed for nausea or vomiting. 30 tablet 1   polyethylene glycol (MIRALAX / GLYCOLAX) 17 g packet Take 17 g by mouth daily.     Probiotic Product (ALIGN) 4 MG CAPS Take 4 mg by mouth daily.     vitamin E 180 MG (400 UNITS) capsule Take 400 Units by mouth daily.     No facility-administered medications prior to visit.     PAST MEDICAL HISTORY: Past Medical History:  Diagnosis Date   Allergy    Anxiety    Arthritis    Cervical dysplasia    Endometriosis    Fatigue    GERD (gastroesophageal reflux disease)    History of hiatal hernia    pt states she currently has a hiatal hernia   IBS (irritable bowel syndrome)    Migraine    Osteopenia    Osteoporosis 01/2019   T score -3.1   Osteoporosis    Plantar fasciitis    PONV  (postoperative nausea and vomiting)    also difficult to wake up   Recurrent vaginitis    Reflux    Scoliosis    Wears glasses      PAST SURGICAL HISTORY: Past Surgical History:  Procedure Laterality Date   ANTERIOR CERVICAL  DECOMP/DISCECTOMY FUSION  04/17/2011   Procedure: ANTERIOR CERVICAL DECOMPRESSION/DISCECTOMY FUSION 2 LEVELS;  Surgeon: Hosie Spangle;  Location: [facility];  Service: Neurosurgery;  Laterality: N/A;  Cervical five-six, six-seven anterior cervical decompression with fusion,  plating,  and bonegraft    APPENDECTOMY  1978   BACK SURGERY N/A    BREAST BIOPSY  07/13/2011   Procedure: BREAST BIOPSY WITH NEEDLE LOCALIZATION;  Surgeon: Rolm Bookbinder, MD;  Location: [facility];  Service: General;  Laterality: Right;  Right breast wire localization biopsy   BREAST EXCISIONAL BIOPSY Right 2013   BREAST SURGERY     Breast Bx-Benign   CHOLECYSTECTOMY N/A 09/08/2021   Procedure: LAPAROSCOPIC CHOLECYSTECTOMY;  Surgeon: Erroll Luna, MD;  Location: [facility];  Service: General;  Laterality: N/A;   [employer]  08/02/1995   RIH   LAPAROSCOPIC ENDOMETRIOSIS FULGURATION  1997   PELVIC LAPAROSCOPY     ROTATOR CUFF REPAIR     right 2002 left 2000     FAMILY HISTORY: Family History  Problem Relation Age of Onset   Cancer Father        lymphoma   Other Mother        bipolar,reflux   Bipolar disorder Mother    Other Brother        sinus problems   Cancer Maternal Aunt        uterine cancer   Breast cancer Maternal Aunt        40's   Diabetes Maternal Aunt    Cancer Paternal Aunt        Colon cancer   Breast cancer Cousin 45       Mat. 1st cousin     SOCIAL HISTORY: Social History   Socioeconomic History   Marital status: Married    Spouse name: Fritz Pickerel   Number of children: 0   Years of education: 16   Highest education level: Not on file  Occupational History    Comment: [employer]  Tobacco Use   Smoking status: Former    Types: Cigarettes    Quit date: 05/17/1983    Years since quitting: 38.4   Smokeless tobacco: Never  Vaping Use   Vaping Use: Never used  Substance and Sexual Activity   Alcohol use: Not Currently   Drug use: No   Sexual activity: Not Currently    Birth control/protection: Post-menopausal    Comment: intercourse age 1 , sexual partners more than 5  Other Topics Concern   Not on file  Social History Narrative   Pt is married, no children.  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    Caffeine- very little.   Right handed   Lives at home with her husband   Social Determinants of Health   Financial Resource Strain: Not on file  Food Insecurity: Not on file  Transportation Needs: Not on file  Physical Activity: Not on file  Stress: Not on file  Social Connections: Not on file  Intimate Partner Violence: Not on file      PHYSICAL EXAM  Vitals:   10/20/21 0954  BP: (!) 99/51  Pulse: 67  Weight: 116 lb 8 oz (52.8 kg)  Height: '5\' 6"'$  (1.676 m)    Body mass index is 18.8 kg/m.   Generalized: Well developed, in no acute distress  Cardiology: normal rate and rhythm, no murmur auscultated  Respiratory: clear to auscultation bilaterally  Neurological examination  Mentation: Alert oriented to time, place, history taking. Follows all commands speech and language fluent Cranial nerve II-XII: Pupils were equal round reactive to light. Extraocular movements were full, visual field were full on confrontational test. Facial sensation and strength were normal. Head turning and shoulder shrug  were normal and symmetric. Motor: The motor testing reveals 5 over 5 strength of all 4 extremities. Good symmetric motor tone is noted throughout.  Gait and station: Gait is normal.     DIAGNOSTIC DATA (LABS, IMAGING, TESTING) - I reviewed patient records, labs, notes, testing and imaging myself where  available.  Lab Results  Component Value Date   WBC 4.2 08/31/2021   HGB 12.8 08/31/2021   HCT 40.1 08/31/2021   MCV 95.9 08/31/2021   PLT 205 08/31/2021      Component Value Date/Time   NA 140 08/31/2021 1100   NA 134 03/26/2020 1056   K 4.4 08/31/2021 1100   CL 103 08/31/2021 1100   CO2 31 08/31/2021 1100   GLUCOSE 98 08/31/2021 1100   BUN 7 (L) 08/31/2021 1100   BUN 8 03/26/2020 1056   CREATININE 0.73 08/31/2021 1100   CALCIUM 9.4 08/31/2021 1100   PROT 6.9 08/31/2021 1100   ALBUMIN 4.0 08/31/2021 1100   AST 18 08/31/2021 1100   ALT 13 08/31/2021 1100   ALKPHOS 36 (L) 08/31/2021 1100   BILITOT 0.6 08/31/2021 1100   GFRNONAA >60 08/31/2021 1100   GFRAA 91 03/26/2020 1056   No results found for: "CHOL", "HDL", "LDLCALC", "LDLDIRECT", "TRIG", "CHOLHDL" No results found for: "HGBA1C" No results found for: "VITAMINB12" No results found for: "TSH"      No data to display               No data to display           ASSESSMENT AND PLAN  66 y.o. year old female  has a past medical history of Allergy, Anxiety, Arthritis, Cervical dysplasia, Endometriosis, Fatigue, GERD (gastroesophageal reflux disease), History of hiatal hernia, IBS (irritable bowel syndrome), Migraine, Osteopenia, Osteoporosis (01/2019), Osteoporosis, Plantar fasciitis, PONV (postoperative nausea and vomiting), Recurrent vaginitis, Reflux, Scoliosis, and Wears glasses. here with    Migraine without aura and with status migrainosus, not intractable  Yolanda Carroll is doing very well from a headache standpoint. We will continue Amovig and Nurtec. She is receiving Amovig through [facility] and will call when updated rx is needed. She will continue to work with care team for back pain, anxiety and GERD. Healthy lifestyle habits encouraged. She will follow up with me in 1 year, sooner if needed.   No orders of the defined types were placed in this encounter.    No orders of the defined types were placed in this  encounter.     Debbora Presto, MSN, FNP-C 10/20/2021, 10:14 AM  [facility] [address], [place], [place] [address] [phone]

## 2021-10-20 ENCOUNTER — Telehealth: Payer: Self-pay | Admitting: Neurology

## 2021-10-20 ENCOUNTER — Encounter: Payer: Self-pay | Admitting: Family Medicine

## 2021-10-20 ENCOUNTER — Ambulatory Visit (INDEPENDENT_AMBULATORY_CARE_PROVIDER_SITE_OTHER): Payer: Medicare Other | Admitting: Family Medicine

## 2021-10-20 VITALS — BP 99/51 | HR 67 | Ht 66.0 in | Wt 116.5 lb

## 2021-10-20 DIAGNOSIS — G43001 Migraine without aura, not intractable, with status migrainosus: Secondary | ICD-10-CM | POA: Diagnosis not present

## 2021-10-20 NOTE — Telephone Encounter (Signed)
error 

## 2021-10-25 ENCOUNTER — Other Ambulatory Visit: Payer: PRIVATE HEALTH INSURANCE

## 2021-10-26 ENCOUNTER — Ambulatory Visit (INDEPENDENT_AMBULATORY_CARE_PROVIDER_SITE_OTHER): Payer: Medicare Other | Admitting: Psychiatry

## 2021-10-26 ENCOUNTER — Encounter: Payer: Self-pay | Admitting: Psychiatry

## 2021-10-26 DIAGNOSIS — F5105 Insomnia due to other mental disorder: Secondary | ICD-10-CM

## 2021-10-26 DIAGNOSIS — F410 Panic disorder [episodic paroxysmal anxiety] without agoraphobia: Secondary | ICD-10-CM

## 2021-10-26 DIAGNOSIS — F411 Generalized anxiety disorder: Secondary | ICD-10-CM

## 2021-10-26 DIAGNOSIS — G43009 Migraine without aura, not intractable, without status migrainosus: Secondary | ICD-10-CM

## 2021-10-26 DIAGNOSIS — F331 Major depressive disorder, recurrent, moderate: Secondary | ICD-10-CM | POA: Diagnosis not present

## 2021-10-26 DIAGNOSIS — R110 Nausea: Secondary | ICD-10-CM | POA: Diagnosis not present

## 2021-10-26 NOTE — Progress Notes (Signed)
Yolanda Carroll [id number] 08/27/1955 66 y.o.   Subjective:   Patient ID:  Yolanda Carroll is a 66 y.o. (DOB 04-13-1956) female.  Chief Complaint:  Chief Complaint  Patient presents with   Follow-up   Anxiety   Other    Medical sx    Anxiety Symptoms include nausea and nervous/anxious behavior. Patient reports no confusion, decreased concentration or suicidal ideas.     Yolanda Carroll presents to the office today for follow-up of anxiety and migraine.  seen in July 2020.  No meds were changed.  She requires name brand Depakote because the generic caused worsening headaches.  Retired November 14, 2018.  Was so burned out at work.  Helping with church projects and helping friends.  Focusing on her health and doing PT for back and hip.  No kids or gkids.  Helps care for 24 yo M-in-law.  H is also busy which helps.  Given a surprise retirement party.     seen January 2021.  Nefazodone is no longer manufactured and she had to wean off.  We also discussed the potential of weaning off Depakote because Aimovig had been very effective at managing her migraine headache.  She called August 11, 2019 stating that she did taper off of Depakote because she did not feel like she needed it any more.  Her last dose was July 23, 2019 but afterwards noticed heartburn and swallowing issues.  Her GI doctor added Pepcid 40 mg daily.  Her neurologist had indicated that Depakote can sometimes help with esophageal issues and she wondered if that was connected.  She was given the option to restart a lower dose and perhaps taper more slowly.  Off Depakote for a month.  She elected not to restart it.  08/19/2019 appointment the following is noted: Tinnitus and mild anxiety worse off the Depakote and with some mind racing esp in the AM.   Most is better. Reflux is worse off Depakote ER 250 and wondering if she should restart it.   Disc article on Depakote helping GERD by increasing lower esophageal sphincter  tone. Has travelled some with family events.  M-in-law 58 soon.   HA good with Aimovig. Don't do season changes well and more HA in Spring often. Sleep not great lately with leg cramps.  Started more exercise.  Started snoring and H is a light sleeper.  Meds help. Plan no med changes.  10/03/2019 phone call with patient asking to restart residual nefazodone that she has on hand.  She felt that it helped back pain and she is experiencing more back pain recently.  She is aware that it is no longer manufactured to our knowledge and once she runs out it will not be available.  She indicated she had an off to take half of 150 mg nefazodone tablet for about 2 to 3 months and would like to do so.  11/05/2019 appointment with the following noted:  Appt moved earlier DT the following. Had stopped Depakote and nefazadone and reflux got worse.  Restarting Depakote didn't help much.  Restarted nefazodaone and reflux better right away.  May not need Depakote but doesn't want to change.  Can't exercise DT back pain since Jan ans worse since March.  Getting new doctor.  More depressed and anxious and sleep problems DT back pain.   Usually only 0.25 mg alprazolam at night. Plan: has to stop nefazodone when it runs out bc no longer manufactured.  Therefore will try trazodone in it's  place.  01/07/20 appt with the following noted: She and H both got Covid.  Was pretty sick for 10 days.  Lost taste.  Had little resp stuff but severe diarrhea and nausea and couldn't keep fluids down.  No residual sx. H had resp sx with pneumonia and had to get O2.  Hosp for 3 days. Still have some nefazodone and nursing it along. Tried trazodone 50 HS for 2 nights and didn't sleep well and had a HA  Nefazodone and Depakote both helped the GERD.  She has enough nefazadone for about 6 weeks. Plan: Nefazodone helped the reflux and she wants to take it as long as possible.  She didn't have withdrawal off nefazodone. When she runs out  then start trazodone.  If that fails then use Viibryd.  Disc SE and alternatives.   Rec try trazodone again but use 100 mg and see if sleeping better prevents the AM HA.  If HA recurs then viibryd. Starter kit.  Depakote didn't help as much as expected and she might stop it.   02/18/2020 phone call: Yolanda Carroll called to report that she is experiencing high anxiety.  It is causing chest discomfort and breathing problems. Has appt 11/18 and is on the wait list but needs to discuss how to relief this anxiety.  Please call. Response: Note Rtc to patient, she does have the Viibryd and does remember that discussion. She has not been able to start that because she received a steroid injection a couple weeks ago and caused her stomach to be upset. She didn't want to start it knowing that's a side effect. She feels like the steroid injection may have worsened all her symptoms. She did try the trazodone but it was too much for her, even taking a 1/2 tablet. She does have some serzone left and has been taking that until this improves. Also taking Xanax as needed during day if needed. Hoping all of this will improve soon. Informed her I would update Dr. Clovis Pu with her information.        04/01/2020 appointment with the following noted: Out of nefazodone a few weeks ago.  More reflux off it.  Tried trazodone a couple of times.  Tried 100 mg trazodone and heart skipped.  50 mg awoke with chest tightness.  Long haul Covid with GI sx.  Nausea and anxiety problems daily since mid September.  Covid early August and got monoclonal infusion which helped.  Headed to long haul Covid clinic in [place] Monday.   Needed to increase alprazolam to BID.  And 80% better with that.  Panic some out of bed.  Sx started after steroid shot for her back 9/21.   Hasn't tried Viibryd DT fear of GI worsening.  Poor appetite and lost 15#. Not taken tramadol yet. Migraine under control. Plan: no med changes  06/01/20 appt with following  noted: Starting generic Depakote ER generic today. Started Mirtazapine 3.75 mg and it worked right away for nausea for a week.  Then increased to 7.5 mg HS.  Most days is feeling really well.  No SE except mild drowsiness. Still has post Covid problems with stomach but much better.  Regained 7# of needed weight.  Need to try to gain 10# more. Zofran failed. Had a lot anxiety with N but it's better now.  Was waking with panic and no longer N and anxiety better too. Severe scoliosis facing big surgery in summer with long recovery.  Can't ride in car for long. Plan:  No med changes  08/18/2020 appointment with the following noted: Gained 12# and thankful for mirtazapine. Long Covid GI sx.  Better not gone with mirtazapine. Rare AM panic.  Sleep good. Scoliosis and may require surgery. Can't sit or stand for long Depakote brand ER 250 helps heartburn. Generic failed. Patient denies difficulty with sleep initiation with Xanax.. Denies appetite disturbance.  Patient reports that energy and motivation have been good.  Patient denies any difficulty with concentration.  Patient denies any suicidal ideation. More depressed and tearful with pain but varies with pain levels.  Good and bad days.  Not too depressed with anxiety worse. Plan: Continue mirtazapine 7.5 mg with Xanax for GI problems including nausea and appetite.  It has worked some.  Try higher dose. Insomnia managed with  With alprazolam.    11/18/2020 appointment with the following noted: Increased mirtazapine to 7.5 mg 1 and 1/2 tablet bc  less anxious with it. Facing big back surgery for severe scoliosis 11/30/20.  Can't drive in car for distance or travel. Now on Medicare. Depakote ER BRAND helped GI problems which were worse on generic. Not markedly depressed.  Patient reports stable mood and denies depressed or irritable moods.   Patient denies difficulty with sleep initiation or maintenance. Denies appetite disturbance.  Patient reports  that energy and motivation have been good.  Patient denies any difficulty with concentration.  Patient denies any suicidal ideation. Plan: No med changes Continue Depakote brand ER 250 mg daily, continue alprazolam 0.25 mg nightly, continue mirtazapine 7.5 mg nightly  04/26/2021 appointment with the following noted: Still on hydrocodone from surgery on back in July.  Driving.  Healing slowly. Pain before surgery is gone now.  Now pain related to adjusting to rod. But surgery was successful. Yolanda Carroll has been helpful. Has reduced mirtazapine 3.75 mg HS to try to come off it.  Sleep a little worse if doesn't take it.  Stomach problems are better.   No HA in a year but started having migraines in a year.  Migraines started after change in logo of brand Depakote so restarted Aimovig.  Depakote also helped acid reflux. Mirtazapine helped sleep and appetite.  Weight is good.  Sleep is good with it still. Mood is OK. Active at church and people are supportive. Continue Depakote brand ER 250 mg daily, continue alprazolam 0.25 mg nightly, continue mirtazapine 7.5 mg nightly  08/03/2021 appointment the following noted: Not doing so good.  Was doing great after surgery. A few weeks ago the nausea and anxiety came back. Had reduced mirtazapine to 3.75 mg HS, back up to 7.5 mg HS about 2 weeks.  No effects so far. Mad and depressed and not me.  Nausea coming back has been upsetting. Pursuing GI work up. Awakens with anxiety.   No sleep trouble. Conc worse. Plan:Was Much better with with anxiety, appetite and sleep on mirtazapine 7.5 mg tablet 1 nightly and relapsed when decreased to 1/tablet nightly,  so increase to 1 &  1/2 of the 7.5 mg tablets HS .   09/27/2021 appointment with the following noted: Last few weeks tough. Cholecystectomy 09/08/21 DT chronic nausea but not better.  Yesterday no nausea for first time in months. Losing weight on bland diet. But it stopped. Chronic back pain after  surgery. SE mirtazapine dry mouth. Usually feels ok in AM but afternoons gets nausea.   Xanax does help afternoon nausea so maybe it's anxiety. Exhausted from all of this.  Still blames some of it on  covid. Not sleepy with Xanax.   Sad bc can't function normally.  Usually not depressed. Sleep is fine. Plan:Was Much better with with anxiety, appetite and sleep on mirtazapine 7.5 mg tablet 1 nightly initially.  Nausea, depression and anxiety Are back again. Option increase to 15 mg .  Yes.  10/26/2021 appointment with the following noted: It helped some.  Still some nausea.  CT scan next week.  Off and on nausea without pattern.  Not clearly anxiety but Xanax prn does help.  Frustrating that it is not predictable. Tolerated in crease in mirtazapine 15 with mild hangover.  Still some anxiety first thing in AM but not as bad as it was and is worse without Xanax.  No reason for the anxiety.   Not depressed but discouraged.  Tries to stay active and involved. Sleep is great always with Xanax. Chronic opiates DT back surgery for scoliosis  Saw neurologist for Migraine and had problems with the meds RX.  Aimovig has helped..    Past Psychiatric Medication Trials: Failed multiple other antidepressants,  nefazodone & Depakote for migraine,  Depakote brand ER 250 helps heartburn. Generic failed. Mirtazapine 7.5 mg benefit citalopram palpitations Trazodone Clonazepam did not help anxiety, alprazolam helps anxiety and sleep  Review of Systems:  Review of Systems  Constitutional:  Positive for appetite change.  Gastrointestinal:  Positive for abdominal pain and nausea.  Musculoskeletal:  Positive for arthralgias and back pain.  Neurological:  Negative for tremors, weakness and headaches.  Psychiatric/Behavioral:  Negative for agitation, behavioral problems, confusion, decreased concentration, dysphoric mood, hallucinations, self-injury, sleep disturbance and suicidal ideas. The patient is  nervous/anxious. The patient is not hyperactive.     Medications: I have reviewed the patient's current medications.  Current Outpatient Medications  Medication Sig Dispense Refill   AIMOVIG 140 MG/ML SOAJ INJECT 140 MG INTO SKIN EVERY 30 DAYS 3 mL 3   ALPRAZolam (XANAX) 0.25 MG tablet Take 1 tablet (0.25 mg total) by mouth 3 (three) times daily as needed for anxiety. (Patient taking differently: Take 0.25 mg by mouth 2 (two) times daily.) 270 tablet 0   Ascorbic Acid (VITAMIN C PO) Take 500 mg by mouth daily.     Calcium Carbonate (CALCIUM 600 PO) Take 600-1,200 mg by mouth daily.     cholecalciferol (VITAMIN D3) 25 MCG (1000 UNIT) tablet Take 1,000 Units by mouth daily.     conjugated estrogens (PREMARIN) vaginal cream Place 1 Applicatorful vaginally 2 (two) times a week. 42.5 g 6   denosumab (PROLIA) 60 MG/ML SOSY injection Inject 60 mg into the skin every 6 (six) months.     DEPAKOTE ER 250 MG 24 hr tablet Take 1 tablet (250 mg total) by mouth daily. (Patient taking differently: Take 250 mg by mouth at bedtime.) 90 tablet 3   gabapentin (NEURONTIN) 100 MG capsule Take 300 mg by mouth every evening.     HYDROcodone-acetaminophen (NORCO) 7.5-325 MG tablet Take 1.5 tablets by mouth every 6 (six) hours as needed for moderate pain.     Lactobacillus (ACIDOPHILUS) 100 MG CAPS Take 100 mg by mouth daily.     lansoprazole (PREVACID) 30 MG capsule Take 30 mg by mouth 2 (two) times daily before a meal.     methocarbamol (ROBAXIN) 750 MG tablet Take 750 mg by mouth 3 (three) times daily.     mirtazapine (REMERON) 7.5 MG tablet TAKE ONE AND ONE HALF TABLET BY MOUTH NIGHTLY (Patient taking differently: Take 2 tablets by mouth at bedtime.) 135  tablet 1   Multiple Vitamin (MULTIVITAMIN) tablet Take 1 tablet by mouth daily.     ondansetron (ZOFRAN) 4 MG tablet Take 1 tablet (4 mg total) by mouth daily as needed for nausea or vomiting. 30 tablet 1   polyethylene glycol (MIRALAX / GLYCOLAX) 17 g packet Take  17 g by mouth daily.     Probiotic Product (ALIGN) 4 MG CAPS Take 4 mg by mouth daily.     vitamin E 180 MG (400 UNITS) capsule Take 400 Units by mouth daily.     No current facility-administered medications for this visit.    Medication Side Effects: None  Allergies:  Allergies  Allergen Reactions   Penicillins Other (See Comments)    Seizures as a child   Atarax [Hydroxyzine] Other (See Comments)    Headache, depression   Bactrim [Sulfamethoxazole-Trimethoprim] Nausea Only   Celexa [Citalopram Hydrobromide] Other (See Comments)    Chest pain   Erythromycin      Upset stomach   Flagyl [Metronidazole]     Dizzy and increased heart rate   Nortriptyline Itching   Orphenadrine     Hand tremors   Prilosec [Omeprazole]     Abdominal pain   Requip [Ropinirole]     Made sx worse   Darvocet [Propoxyphene N-Acetaminophen] Itching   Percocet [Oxycodone-Acetaminophen] Itching    Past Medical History:  Diagnosis Date   Allergy    Anxiety    Arthritis    Cervical dysplasia    Endometriosis    Fatigue    GERD (gastroesophageal reflux disease)    History of hiatal hernia    pt states she currently has a hiatal hernia   IBS (irritable bowel syndrome)    Migraine    Osteopenia    Osteoporosis 01/2019   T score -3.1   Osteoporosis    Plantar fasciitis    PONV (postoperative nausea and vomiting)    also difficult to wake up   Recurrent vaginitis    Reflux    Scoliosis    Wears glasses     Family History  Problem Relation Age of Onset   Cancer Father        lymphoma   Other Mother        bipolar,reflux   Bipolar disorder Mother    Other Brother        sinus problems   Cancer Maternal Aunt        uterine cancer   Breast cancer Maternal Aunt        40's   Diabetes Maternal Aunt    Cancer Paternal Aunt        Colon cancer   Breast cancer Cousin 74       Mat. 1st cousin    Social History   Socioeconomic History   Marital status: Married    Spouse name:  Yolanda Carroll   Number of children: 0   Years of education: 16   Highest education level: Not on file  Occupational History    Comment: [employer]  Tobacco Use   Smoking status: Former    Types: Cigarettes    Quit date: 05/17/1983    Years since quitting: 38.4   Smokeless tobacco: Never  Vaping Use   Vaping Use: Never used  Substance and Sexual Activity   Alcohol use: Not Currently   Drug use: No   Sexual activity: Not Currently    Birth control/protection: Post-menopausal    Comment: intercourse age 81 , sexual partners more  than 5  Other Topics Concern   Not on file  Social History Narrative   Pt is married, no children.  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    Caffeine- very little.   Right handed   Lives at home with her husband   Social Determinants of Health   Financial Resource Strain: Not on file  Food Insecurity: Not on file  Transportation Needs: Not on file  Physical Activity: Not on file  Stress: Not on file  Social Connections: Not on file  Intimate Partner Violence: Not on file    Past Medical History, Surgical history, Social history, and Family history were reviewed and updated as appropriate.   Please see review of systems for further details on the patient's review from today.   Objective:   Physical Exam:  There were no vitals taken for this visit.  Physical Exam Constitutional:      General: She is not in acute distress. Musculoskeletal:        General: No deformity.  Neurological:     Mental Status: She is alert and oriented to person, place, and time.     Cranial Nerves: No dysarthria.     Coordination: Coordination normal.  Psychiatric:        Attention and Perception: Attention and perception normal. She does not perceive auditory or visual hallucinations.        Mood and Affect: Mood is anxious. Mood is not depressed. Affect is not labile, blunt, angry, tearful or inappropriate.        Speech: Speech  normal.        Behavior: Behavior normal. Behavior is cooperative.        Thought Content: Thought content normal. Thought content is not paranoid or delusional. Thought content does not include homicidal or suicidal ideation. Thought content does not include suicidal plan.        Cognition and Memory: Cognition and memory normal.        Judgment: Judgment normal.     Comments: Insight intact Some residual anxiety chronically       Lab Review:     Component Value Date/Time   NA 140 08/31/2021 1100   NA 134 03/26/2020 1056   K 4.4 08/31/2021 1100   CL 103 08/31/2021 1100   CO2 31 08/31/2021 1100   GLUCOSE 98 08/31/2021 1100   BUN 7 (L) 08/31/2021 1100   BUN 8 03/26/2020 1056   CREATININE 0.73 08/31/2021 1100   CALCIUM 9.4 08/31/2021 1100   PROT 6.9 08/31/2021 1100   ALBUMIN 4.0 08/31/2021 1100   AST 18 08/31/2021 1100   ALT 13 08/31/2021 1100   ALKPHOS 36 (L) 08/31/2021 1100   BILITOT 0.6 08/31/2021 1100   GFRNONAA >60 08/31/2021 1100   GFRAA 91 03/26/2020 1056       Component Value Date/Time   WBC 4.2 08/31/2021 1100   RBC 4.18 08/31/2021 1100   HGB 12.8 08/31/2021 1100   HCT 40.1 08/31/2021 1100   PLT 205 08/31/2021 1100   MCV 95.9 08/31/2021 1100   MCH 30.6 08/31/2021 1100   MCHC 31.9 08/31/2021 1100   RDW 12.4 08/31/2021 1100   LYMPHSABS 0.7 12/21/2019 0949   MONOABS 0.6 12/21/2019 0949   EOSABS 0.0 12/21/2019 0949   BASOSABS 0.0 12/21/2019 0949    Normal liver enzymes in September.  No results found for: "POCLITH", "LITHIUM"   No results found for: "PHENYTOIN", "PHENOBARB", "VALPROATE", "CBMZ"   .res Assessment: Plan:    Yolanda Carroll was  seen today for follow-up, anxiety and other.  Diagnoses and all orders for this visit:  Major depressive disorder, recurrent episode, moderate (HCC)  Nausea without vomiting  Generalized anxiety disorder  Panic disorder without agoraphobia  Insomnia due to mental condition  Migraine without aura and without  status migrainosus, not intractable   Greater than 50% of 30 min non face to face time with patient was spent on counseling and coordination of care. We discussed the following. She's worse again with more physical problems  this is affecting mood.  Was Much better with with anxiety, appetite and sleep on mirtazapine 7.5 mg tablet 1 nightly initially.  Nausea, depression and anxiety Are back again.  Continue mirtazapine 15 mg .  Yes. Ongoing GI workiup but if no results from it then suggest Consider olanzapine if doesn't get better. Discussed potential metabolic side effects associated with atypical antipsychotics, as well as potential risk for movement side effects. Advised pt to contact office if movement side effects occur.  Disc SE and nature of antipsychotics and antiemetics with DA etc. She may consider 2nd opinion.   Chronic acid reflux.  Cont Depakote for reflux.  Depakote ER BRAND helped GI problems which were worse on generic. HA worse off Aimovig so restarted.  Supportive therapy dealing with health problems  We discussed the short-term risks associated with benzodiazepines including sedation and increased fall risk among others.  Discussed long-term side effect risk including dependence, potential withdrawal symptoms, and the potential eventual dose-related risk of dementia. Disc newer studies that cast in doubt the relationship with dementia. Disc warnings about combo of Bz and opiates not relevant at this low dose. Continue Xanax 0.25 mg BID for panic and GAD , but option increase TID bc helps nausea.  Consider Genesight testing bc med sensitivity and failures.  FU 2-3 mos  Lynder Parents, MD, DFAPA  Please see After Visit Summary for patient specific instructions.  Future Appointments  Date Time Provider [place]  11/04/2021  2:20 PM [facility] 1 [facility]  11/29/2021 10:00 AM [facility] NURSE [facility] None  10/05/2022  2:00 PM Princess Bruins, MD  [facility] None  10/24/2022 11:00 AM Ubaldo Glassing, Amy, NP [facility] None  10/30/2022 11:00 AM Philemon Kingdom, MD [facility] None    No orders of the defined types were placed in this encounter.     -------------------------------

## 2021-11-04 ENCOUNTER — Ambulatory Visit
Admission: RE | Admit: 2021-11-04 | Discharge: 2021-11-04 | Disposition: A | Discharge status: Home / Self Care | Payer: Medicare Other | Source: Ambulatory Visit | Attending: Gastroenterology | Admitting: Gastroenterology

## 2021-11-04 DIAGNOSIS — R110 Nausea: Secondary | ICD-10-CM

## 2021-11-04 DIAGNOSIS — R634 Abnormal weight loss: Secondary | ICD-10-CM

## 2021-11-04 MED ORDER — IOPAMIDOL (ISOVUE-370) INJECTION 76%
75.0000 mL | Freq: Once | INTRAVENOUS | Status: AC | PRN
  Administered 2021-11-04: 75 mL via INTRAVENOUS

## 2021-11-10 ENCOUNTER — Ambulatory Visit: Payer: PRIVATE HEALTH INSURANCE | Admitting: Psychiatry

## 2021-11-10 ENCOUNTER — Telehealth: Payer: Self-pay | Admitting: Psychiatry

## 2021-11-10 ENCOUNTER — Other Ambulatory Visit: Payer: Self-pay | Admitting: Psychiatry

## 2021-11-10 ENCOUNTER — Ambulatory Visit: Payer: 59 | Admitting: Internal Medicine

## 2021-11-10 MED ORDER — OLANZAPINE 2.5 MG PO TABS: 2.5000 mg | ORAL_TABLET | Freq: Every day | ORAL | 0 refills | Status: DC

## 2021-11-10 NOTE — Telephone Encounter (Signed)
Pt called reporting CT scan results fine. Requesting as discussed to change Mirtazepine to Olanzapine. Next apt 8/30 Contact @ [phone] if needed.

## 2021-11-10 NOTE — Telephone Encounter (Signed)
Pt ready for scheduling on or after 11/29/21  Out-of-pocket cost due at time of visit: $0  Primary: Medicare Prolia co-insurance: 20% (approximately $276) Admin fee co-insurance: 20% (approximately $25)  Secondary: UHC AARP Medicare Supp Prolia co-insurance: Covers Medicare Part B co-insurance Admin fee co-insurance: Covers Medicare Part B co-insurance  Deductible: $226 of $226 met (NOT covered by secondary)  Prior Auth: not required PA# Valid:   ** This summary of benefits is an estimation of the patient's out-of-pocket cost. Exact cost may vary based on individual plan coverage.

## 2021-11-10 NOTE — Telephone Encounter (Signed)
As discussed at the last office visit, if her GI work-up did not give a solution to her abdominal problems then it would be reasonable to try olanzapine at low dose.  I have sent in a prescription for 2.5 mg 1 nightly.  That is the lowest dose made.  If she uses a pill splitter she may try cutting it in half.  1/2 tablet may be sufficient for her.  She could experience benefit within the first day or 2 but give it a week or 2 as long as she is tolerating it to be sure. In order to minimize the risk of daytime sleepiness take the olanzapine 2 to 3 hours before bedtime.

## 2021-11-10 NOTE — Telephone Encounter (Signed)
Pt informed

## 2021-11-10 NOTE — Telephone Encounter (Signed)
Please review

## 2021-11-29 ENCOUNTER — Ambulatory Visit: Payer: PRIVATE HEALTH INSURANCE

## 2021-12-06 ENCOUNTER — Ambulatory Visit (INDEPENDENT_AMBULATORY_CARE_PROVIDER_SITE_OTHER): Payer: Medicare Other

## 2021-12-06 DIAGNOSIS — M810 Age-related osteoporosis without current pathological fracture: Secondary | ICD-10-CM

## 2021-12-06 MED ORDER — DENOSUMAB 60 MG/ML ~~LOC~~ SOSY
60.0000 mg | PREFILLED_SYRINGE | Freq: Once | SUBCUTANEOUS | Status: AC
  Administered 2021-12-06: 60 mg via SUBCUTANEOUS

## 2021-12-06 NOTE — Progress Notes (Signed)
Patient verbally confirmed name, date of birth, and correct medication to be administered. Prolia injection administered and pt tolerated well.  

## 2021-12-07 ENCOUNTER — Other Ambulatory Visit: Payer: Self-pay | Admitting: Psychiatry

## 2021-12-15 ENCOUNTER — Telehealth: Payer: Self-pay | Admitting: Psychiatry

## 2021-12-15 NOTE — Telephone Encounter (Signed)
Next visit is 01/11/22. Yolanda Carroll's medicine was changed from Mirtazapine to Olanzapine, 1/2 tablet before bed. It doesn't seem to be helping her. Should she increase the Olanzapine or go back to the Mirtazapine? Phone number is [phone]. Pharmacy is:  [facility] [id number] - [place], [place]  Phone:  [phone]  Fax:  [phone]

## 2021-12-15 NOTE — Telephone Encounter (Signed)
See message from patient. She said GI issues are not much better. She said the olanzapine gives her a little bit of a hangover. Both medications give her dry mouth. She thinks the mirtazapine works better for her and didn't know if she needed to increase dose of olanzapine or go back to mirtazapine. She has a refill available with mirtazapine.

## 2021-12-16 NOTE — Telephone Encounter (Signed)
The mirtazapine did not seem to work well enough which is why we started the olanzapine.  If she feels like she can tolerate more olanzapine my suggestion is to increase the olanzapine to 1 tablet each evening.

## 2021-12-16 NOTE — Telephone Encounter (Signed)
Patient notified. She said she has had a little bit of a headache the last few days, didn't know if it was related to the olanzapine or not. She said if she increases to 1 tablet and headache persists she will relate it to the olanzapine. Will let us know if no improvement.

## 2021-12-17 NOTE — Telephone Encounter (Signed)
Last Prolia inj 12/06/21 Next Prolia inj due 06/09/22

## 2021-12-19 NOTE — Telephone Encounter (Signed)
Yolanda Carroll lvm today stating the Olanzapine is continuing to give her headaches. She is requesting to go back on the Mitrazapine, and what dosage to use.  Next appointment 12/30/21  Contact information # [phone]

## 2021-12-19 NOTE — Telephone Encounter (Signed)
OK to stop olanzapine and mirtazapine 7.5 mg 1 and 1/2 HS per her request.

## 2021-12-19 NOTE — Telephone Encounter (Signed)
Patient said she had increased the olanzapine and is still c/o headache and has had some dizziness today. She said she did "okay" on the mirtazapine. She said you had her on 1-1/2 tabs of 7.5. She said it probably isn't 100%, but felt she did okay otherwise.

## 2021-12-20 NOTE — Telephone Encounter (Signed)
Patient notified

## 2021-12-22 ENCOUNTER — Telehealth: Payer: Self-pay | Admitting: Psychiatry

## 2021-12-22 DIAGNOSIS — F410 Panic disorder [episodic paroxysmal anxiety] without agoraphobia: Secondary | ICD-10-CM

## 2021-12-29 IMAGING — MG MM DIGITAL SCREENING BILAT W/ TOMO AND CAD
8 series · 9 of 24 positions shown · non-contrast
Comparison: Previous exam(s).

CLINICAL DATA: Screening.

EXAM:
DIGITAL SCREENING BILATERAL MAMMOGRAM WITH TOMOSYNTHESIS AND CAD
TECHNIQUE: Bilateral screening digital craniocaudal and mediolateral oblique
mammograms were obtained. Bilateral screening digital breast
tomosynthesis was performed. The images were evaluated with
computer-aided detection.

[R MLO synth-2D]
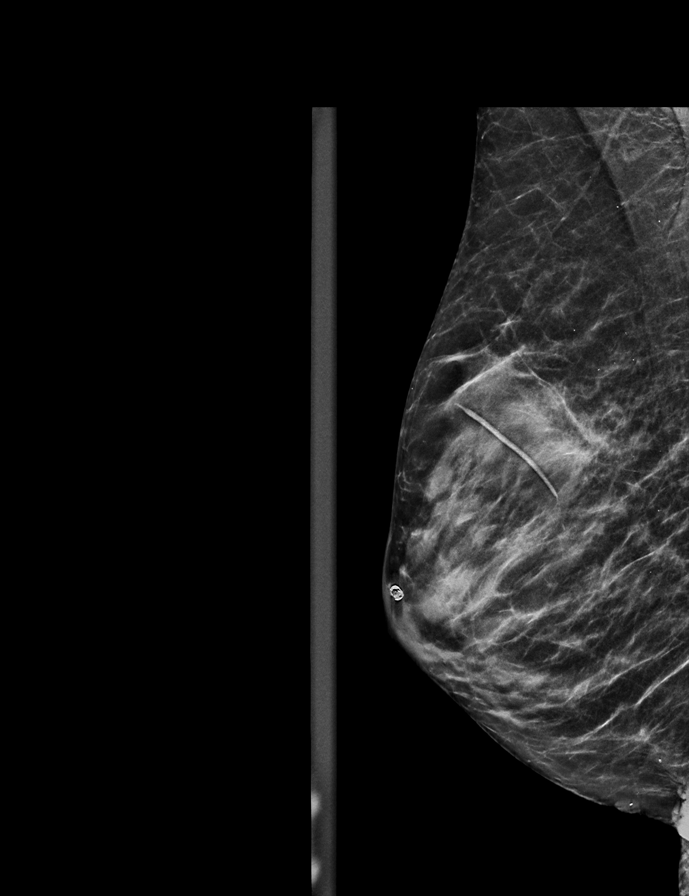

[R CC synth-2D]
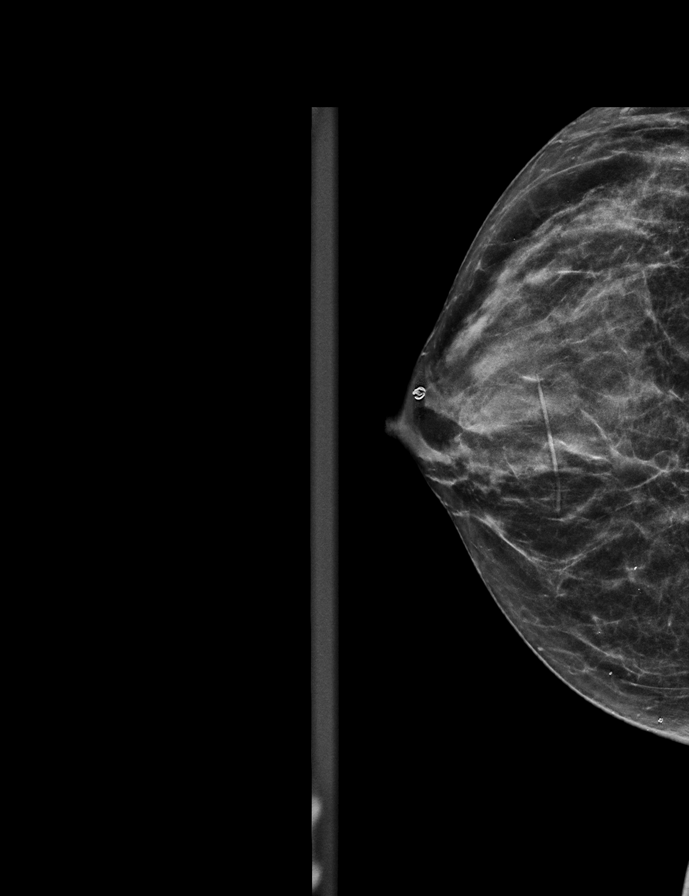

[L CC synth-2D]
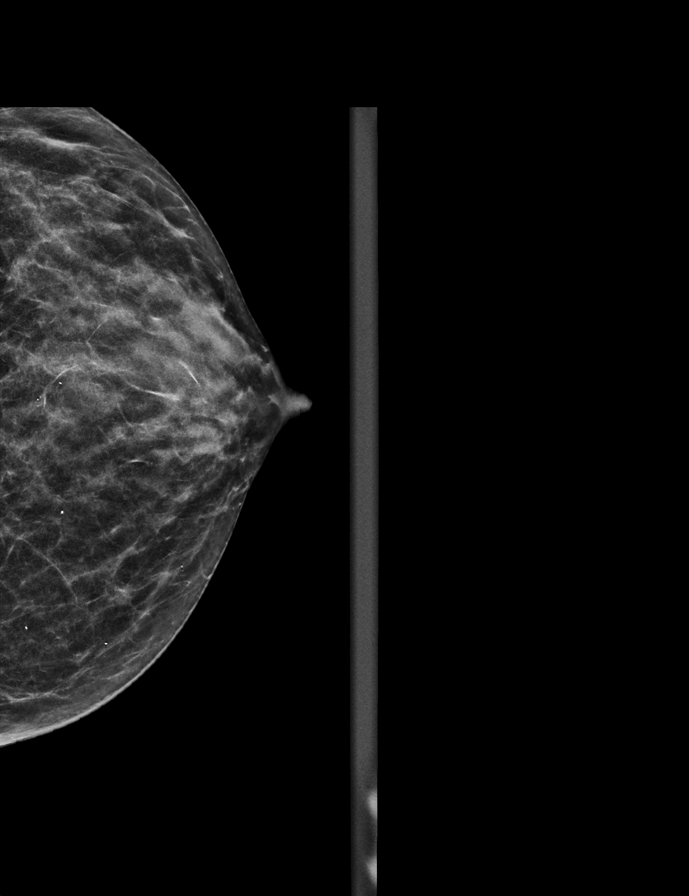

[L MLO synth-2D]
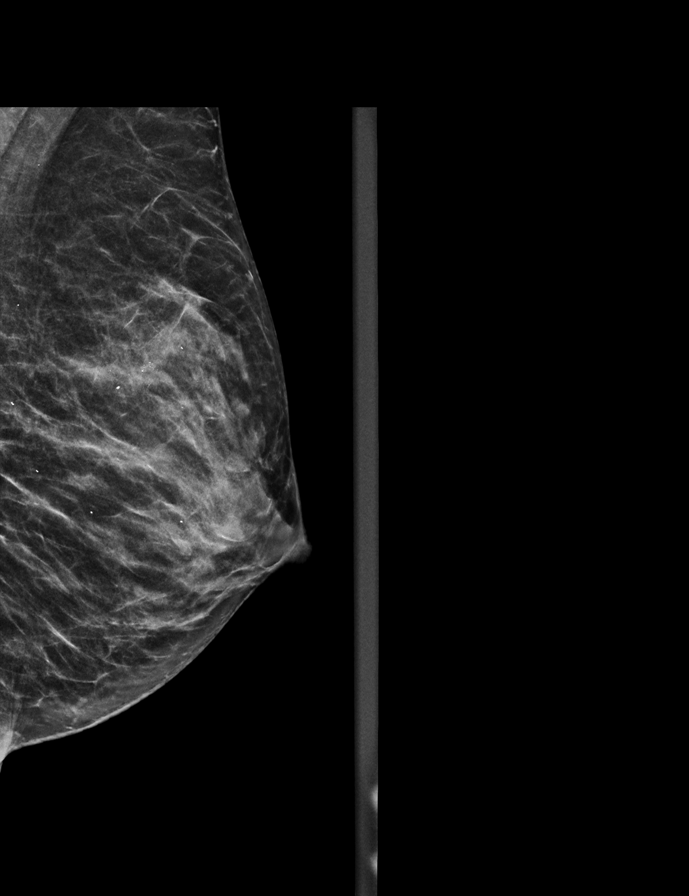

[L MLO tomo · 2 of 51 frames shown]
[frame 17/51]
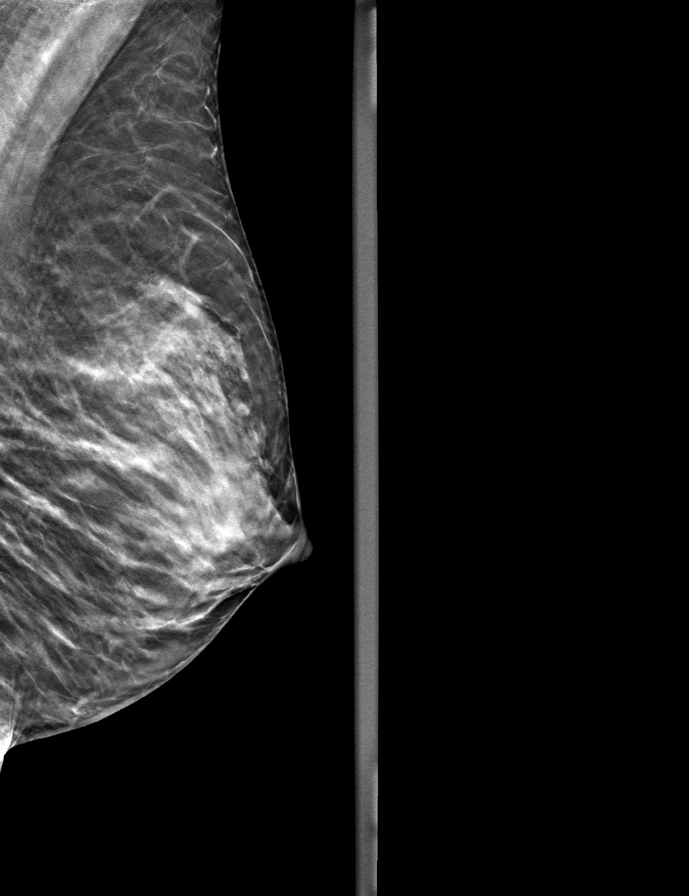
[frame 26/51]
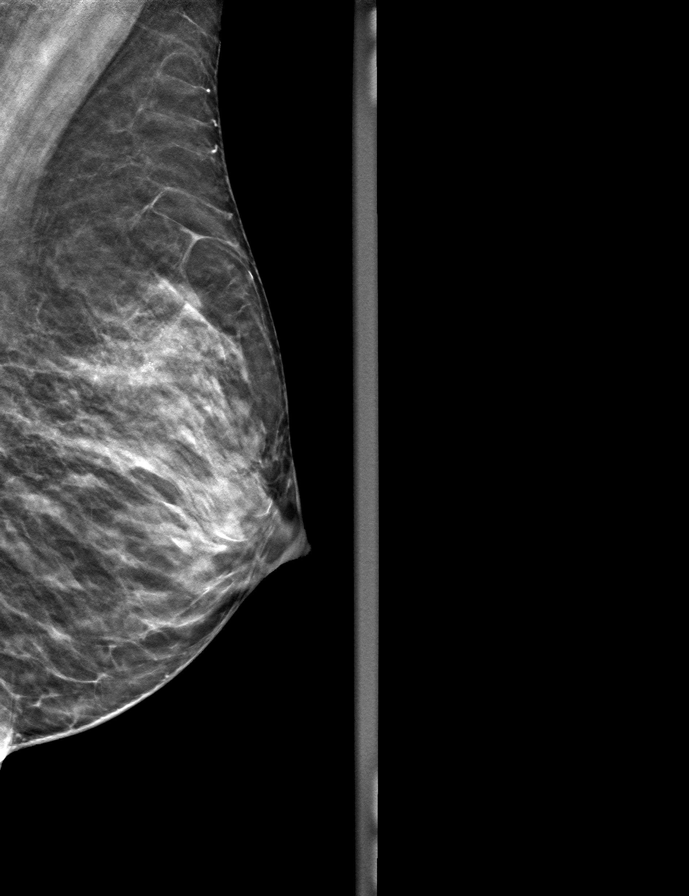

[L CC tomo · tomo slice 24/47.0]
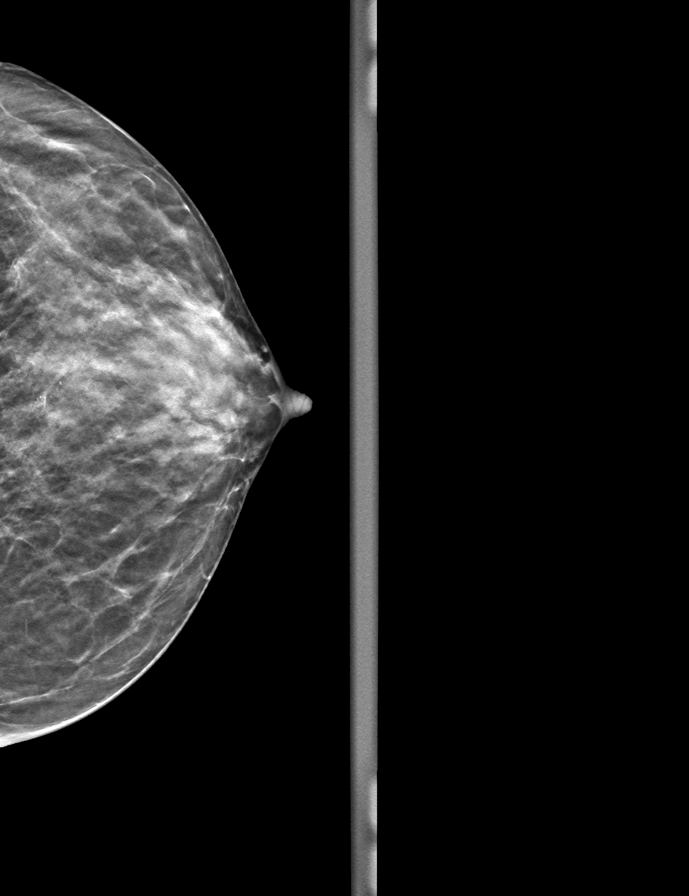

[R MLO tomo · tomo slice 25/48.0]
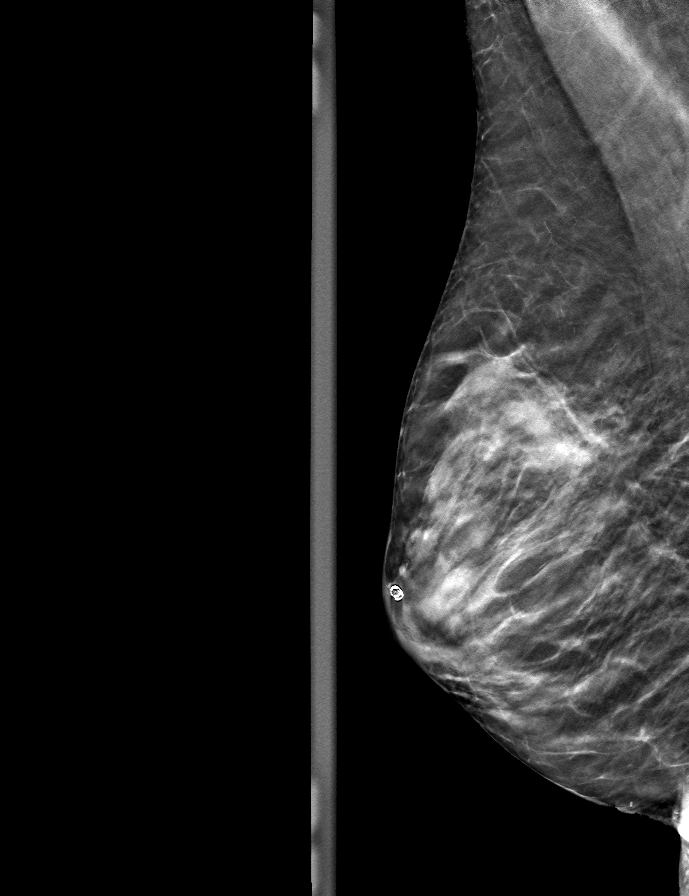

[R CC tomo · tomo slice 25/48.0]
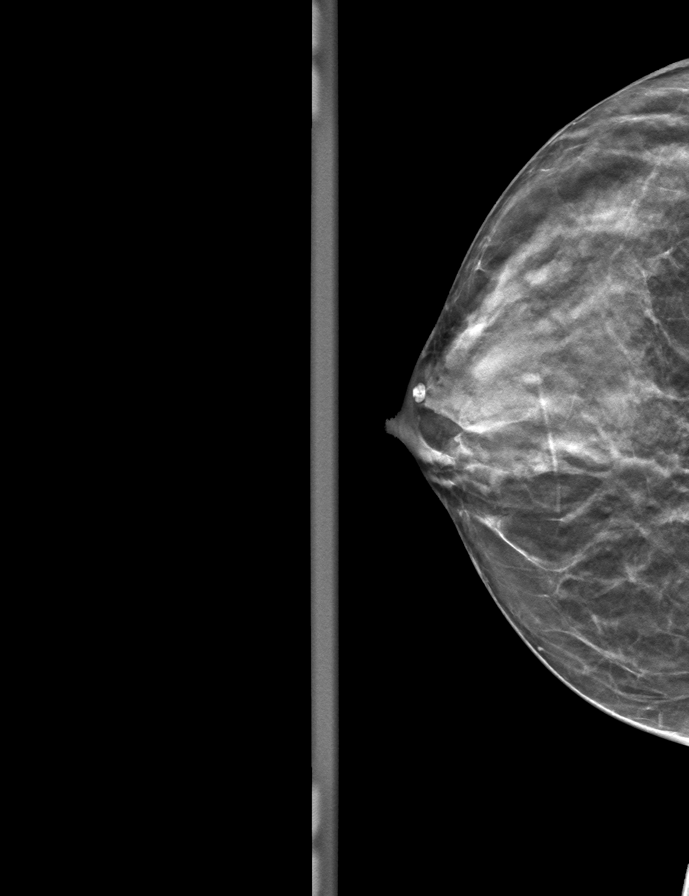

[9 of 24 positions shown; findings below may reference images not displayed]

ACR Breast Density Category c: The breast tissue is heterogeneously
dense, which may obscure small masses.
FINDINGS: There are no findings suspicious for malignancy.
IMPRESSION: No mammographic evidence of malignancy. A result letter of this
screening mammogram will be mailed directly to the patient.

RECOMMENDATION:
Screening mammogram in one year. (Code:[id number])

BI-RADS CATEGORY  1: Negative.

## 2021-12-30 ENCOUNTER — Ambulatory Visit (INDEPENDENT_AMBULATORY_CARE_PROVIDER_SITE_OTHER): Payer: Medicare Other | Admitting: Psychiatry

## 2021-12-30 DIAGNOSIS — F331 Major depressive disorder, recurrent, moderate: Secondary | ICD-10-CM | POA: Diagnosis not present

## 2021-12-30 NOTE — Telephone Encounter (Signed)
Chia at [facility] LVM @ 10:30a.  She said they need additional info on the 3 meds prescribed for the pt:  Alprazolam 0.'25mg'$ , Methocarbamol '750mg'$  and Hydrocodone-acetaminophen.  [place] reference  [id number].    Phone [phone]  Next appt 8/30

## 2021-12-30 NOTE — Progress Notes (Signed)
[facility] Counselor/Therapist Progress Note  Patient ID: Yolanda Carroll, MRN: [id number],    Date: 12/30/2021  Time Spent: 55 minutes   Treatment Type: Individual Therapy  Reported Symptoms:  depression, and anxiety, multiple health concerns  Mental Status Exam:  Appearance:   Casual     Behavior:  Appropriate, Sharing, and Motivated  Motor:  Normal  Speech/Language:   Clear and Coherent  Affect:  Depressed and anxious  Mood:  anxious and depressed  Thought process:  goal directed  Thought content:    WNL  Sensory/Perceptual disturbances:    WNL  Orientation:  oriented to person, place, time/date, situation, day of week, month of year, year, and stated date of December 29, 2021  Attention:  Good  Concentration:  Good  Memory:  WNL  Fund of knowledge:   Good  Insight:    Good  Judgment:   Good  Impulse Control:  Good   Risk Assessment: Danger to Self:  No Self-injurious Behavior: No Danger to Others: No Duty to Warn:no Physical Aggression / Violence:No  Access to Firearms a concern: No  Gang Involvement:No  was a very good decision.  Trying to think through how to handle my situation more positively. Husband supportive. Active in church activities: handbells, prayer shawl ministry, SS class and small groups. Supportive close friends that check in on her. Biggest struggles include: had Covid in 2021 and ever since I've had gastro and anxiety issues, had serious back surgery in July 2022 re: scoliosis, lost 20 lbs that she didn't need to lose and is working with dietician.  Had gall bladder surgery earlier this year. Fears that her health condition may end up permanent, creating depression and anxiety for her. Has some good friends that are supportive. Has withdrawn some from some activities that may not should have but plans to connect with some former co-workers which is good.  Is good that she is taking a step back in order to better care for herself and setting  realistic limits as needed for her own healing.   Interventions: Cognitive Behavioral Therapy  Treatment Goals  Long-term goal:  Elevate mood and show evidence of usual energy, activities, and socialization level. Short-term goal: Verbally identify the source of depressed mood and work to decrease its impact. Strategies: Identify cognitive self-talk that tends to support depression.  Verbalize hopeful and positive statements regarding her has not and future.  Diagnosis:   ICD-10-CM   1. Major depressive disorder, recurrent episode, moderate ([place])  F33.1      Plan:   Patient in today struggling with depression, anxiety, apprehension, and some fears mostly regarding her multiple physical health issues.  Retiring from her job has been a good decision for patient.  Is trying to manage multiple health concerns but her priority is regarding a back surgery she had in July 2022 where there is still a good bit of pain that she has to confront daily.  Her surgeon does believe that things will continue to get better over time and patient feels comfortable in his care.  Patient committed to continuing to do her PT, and work with a dietitian who is helping her try to regain some of the 20 pounds that she lost in the midst of so much going on with her health and surgery.  As noted above, patient fears that her current health condition will end up being permanent which would be even more depressing and anxiety provoking for her.  Doctor has  indicated more hope for her and patient is working to be more hopeful herself but that is a work in progress.  For all she has been through, her attitude is good.  Realizing that she needs to focus more on her own self-care right now versus others and to follow through on all the recommendations from her surgeon and other doctors involved in her care.  Definitely motivated and took notes in session today on several suggested strategies that can help her with her mood issues  of anxiety and depression.  We will see again within 2 weeks.  Updated goals and patient is in agreement.  Plan to see patient again in 2 weeks to continue goal-directed treatment.  This record has been created using [employer].  Chart creation errors have been sought, but may not always have been located and corrected.  Such creation errors do not reflect on the standard of medical care provided.   Shanon Ace, LCSW

## 2021-12-30 NOTE — Telephone Encounter (Signed)
Returned call. We only prescribe the alprazolam. The requested information was provided - how long patient has taken, Dx, has she tried/failed any other meds for Dx.

## 2022-01-07 ENCOUNTER — Other Ambulatory Visit: Payer: Self-pay | Admitting: Psychiatry

## 2022-01-10 ENCOUNTER — Ambulatory Visit (INDEPENDENT_AMBULATORY_CARE_PROVIDER_SITE_OTHER): Payer: Medicare Other | Admitting: Psychiatry

## 2022-01-10 DIAGNOSIS — F331 Major depressive disorder, recurrent, moderate: Secondary | ICD-10-CM | POA: Diagnosis not present

## 2022-01-10 NOTE — Progress Notes (Signed)
[facility] Counselor/Therapist Progress Note  Patient ID: Yolanda Carroll, MRN: [id number],    Date: 01/10/2022  Time Spent: 55 minutes   Treatment Type: Individual Therapy  Reported Symptoms: anxiety  Mental Status Exam:  Appearance:   Casual and Neat     Behavior:  Appropriate, Sharing, and Motivated  Motor:  Affected by her back issues  Speech/Language:   Clear and Coherent  Affect:  Depressed, anxious  Mood:  anxious and depressed  Thought process:  goal directed  Thought content:    Some overthinking "but not every day"  Sensory/Perceptual disturbances:    WNL  Orientation:  oriented to person, place, time/date, situation, day of week, month of year, year, and stated date of January 10, 2022  Attention:  Good  Concentration:  Good  Memory:  WNL  Fund of knowledge:   Good  Insight:    Good  Judgment:   Good  Impulse Control:  Good   Risk Assessment: Danger to Self:  No Self-injurious Behavior: No Danger to Others: No Duty to Warn:no Physical Aggression / Violence:No  Access to Firearms a concern: No  Gang Involvement:No   Subjective:  Patient in for appointment today reporting  depression, anxiety, some apprehension and fears, all related to multiple physical health issues that have occurred since her retirement. Husband supportive. Feels guilty at times that she can't do as much as she and husband would like, as how she is going to feel on a particular day in not predictable. Did have opportunity to use the deep breathing exercises recently when at church and was feeling claustraphobic. Frustrated with pain meds for her back as she needs them to control pain but also needing to get off them and onto to something less strong. "Wanting to trust God more but sometimes I just feel forsaken", but "I know I'm not." Supportive friends helpful. Working through fears that her health condition may end up being permanent leading her to feel increased anxiety and depression.  Shared strategies she can use to help manage those feelings more effectively. The length of time in her healing is feeling very long to  her. Discussed boundaries and setting limits in taking on responsibilities "even at church."  Intervention : Cognitive Behavioral Therapy and Ego-Supportive  Long-term goal:  Elevate mood and show evidence of usual energy, activities, and socialization level. Short-term goal: Verbally identify the source of depressed mood and work to decrease its impact. Strategies: Identify cognitive self-talk that tends to support depression.  Verbalize hopeful and positive statements regarding her has not and future.  Diagnosis:   ICD-10-CM   1. Major depressive disorder, recurrent episode, moderate ([facility])  F33.1      Plan:   Today showing good motivation and participation in session focusing on her depression, anxiety, apprehension, and fears are related to multiple physical health issues.  Did well in working through some of her anxiety and depression as well as her tendency to think a lot about the future and worry about her health problems being more permanent versus continuing to heal.  Currently her doctors feel she is continuing to heal and that it is just taking some longer than she had hoped for.  Encouraged to use her healthy support system of friends as well as other strategies for managing both the physical aspects of her health challenges as well as the emotional as discussed today.  Seemed very appreciative of being able to talk through a lot of her concerns and  anxieties, taking notes during session as they are useful in between sessions. Encouraged patient in her practice of more positive behaviors including: Having frequent contact with friends and neighbors that are supportive, staying in the present and focusing on what she can change or control, being outside some daily, remain on her prescribed medication, healthy nutrition and exercise as she is able, trying  not to assume negatives or worst case scenarios, continue being involved in her church, refrain from [place], allow her faith to be an emotional support as well as spiritual, and recognize the strength she shows working with goal directed behaviors to move in a direction that supports increased confidence and improved emotional health.  Review and progress/challenges noted with patient.  Next appointment within 2 to 3 weeks.  This record has been created using [employer].  Chart creation errors have been sought, but may not always have been located and corrected.  Such creation errors do not reflect on the standard of medical care provided.   Shanon Ace, LCSW

## 2022-01-11 ENCOUNTER — Ambulatory Visit (INDEPENDENT_AMBULATORY_CARE_PROVIDER_SITE_OTHER): Payer: Medicare Other | Admitting: Psychiatry

## 2022-01-11 ENCOUNTER — Encounter: Payer: Self-pay | Admitting: Psychiatry

## 2022-01-11 DIAGNOSIS — F331 Major depressive disorder, recurrent, moderate: Secondary | ICD-10-CM

## 2022-01-11 DIAGNOSIS — F410 Panic disorder [episodic paroxysmal anxiety] without agoraphobia: Secondary | ICD-10-CM | POA: Diagnosis not present

## 2022-01-11 DIAGNOSIS — F411 Generalized anxiety disorder: Secondary | ICD-10-CM

## 2022-01-11 DIAGNOSIS — F5105 Insomnia due to other mental disorder: Secondary | ICD-10-CM

## 2022-01-11 DIAGNOSIS — G43009 Migraine without aura, not intractable, without status migrainosus: Secondary | ICD-10-CM

## 2022-01-11 DIAGNOSIS — R110 Nausea: Secondary | ICD-10-CM

## 2022-01-11 NOTE — Progress Notes (Addendum)
Yolanda Carroll [id number] 07-01-1955 66 y.o.   Subjective:   Patient ID:  Yolanda Carroll is a 66 y.o. (DOB 1956/03/14) female.  Chief Complaint:  Chief Complaint  Patient presents with   Follow-up   Depression   Anxiety    Anxiety Symptoms include nausea and nervous/anxious behavior. Patient reports no confusion, decreased concentration or suicidal ideas.    Depression        Associated symptoms include appetite change.  Associated symptoms include no decreased concentration, no headaches and no suicidal ideas.  Yolanda Carroll presents to the office today for follow-up of anxiety and migraine.  seen in July 2020.  No meds were changed.  She requires name brand Depakote because the generic caused worsening headaches.  Retired November 14, 2018.  Was so burned out at work.  Helping with church projects and helping friends.  Focusing on her health and doing PT for back and hip.  No kids or gkids.  Helps care for 11 yo M-in-law.  H is also busy which helps.  Given a surprise retirement party.     seen January 2021.  Nefazodone is no longer manufactured and she had to wean off.  We also discussed the potential of weaning off Depakote because Aimovig had been very effective at managing her migraine headache.  She called August 11, 2019 stating that she did taper off of Depakote because she did not feel like she needed it any more.  Her last dose was July 23, 2019 but afterwards noticed heartburn and swallowing issues.  Her GI doctor added Pepcid 40 mg daily.  Her neurologist had indicated that Depakote can sometimes help with esophageal issues and she wondered if that was connected.  She was given the option to restart a lower dose and perhaps taper more slowly.  Off Depakote for a month.  She elected not to restart it.  08/19/2019 appointment the following is noted: Tinnitus and mild anxiety worse off the Depakote and with some mind racing esp in the AM.   Most is better. Reflux is  worse off Depakote ER 250 and wondering if she should restart it.   Disc article on Depakote helping GERD by increasing lower esophageal sphincter tone. Has travelled some with family events.  M-in-law 36 soon.   HA good with Aimovig. Don't do season changes well and more HA in Spring often. Sleep not great lately with leg cramps.  Started more exercise.  Started snoring and H is a light sleeper.  Meds help. Plan no med changes.  10/03/2019 phone call with patient asking to restart residual nefazodone that she has on hand.  She felt that it helped back pain and she is experiencing more back pain recently.  She is aware that it is no longer manufactured to our knowledge and once she runs out it will not be available.  She indicated she had an off to take half of 150 mg nefazodone tablet for about 2 to 3 months and would like to do so.  11/05/2019 appointment with the following noted:  Appt moved earlier DT the following. Had stopped Depakote and nefazadone and reflux got worse.  Restarting Depakote didn't help much.  Restarted nefazodaone and reflux better right away.  May not need Depakote but doesn't want to change.  Can't exercise DT back pain since Jan ans worse since March.  Getting new doctor.  More depressed and anxious and sleep problems DT back pain.   Usually only 0.25 mg alprazolam at  night. Plan: has to stop nefazodone when it runs out bc no longer manufactured.  Therefore will try trazodone in it's place.  01/07/20 appt with the following noted: She and H both got Covid.  Was pretty sick for 10 days.  Lost taste.  Had little resp stuff but severe diarrhea and nausea and couldn't keep fluids down.  No residual sx. H had resp sx with pneumonia and had to get O2.  Hosp for 3 days. Still have some nefazodone and nursing it along. Tried trazodone 50 HS for 2 nights and didn't sleep well and had a HA  Nefazodone and Depakote both helped the GERD.  She has enough nefazadone for about 6  weeks. Plan: Nefazodone helped the reflux and she wants to take it as long as possible.  She didn't have withdrawal off nefazodone. When she runs out then start trazodone.  If that fails then use Viibryd.  Disc SE and alternatives.   Rec try trazodone again but use 100 mg and see if sleeping better prevents the AM HA.  If HA recurs then viibryd. Starter kit.  Depakote didn't help as much as expected and she might stop it.   02/18/2020 phone call: Yolanda Carroll called to report that she is experiencing high anxiety.  It is causing chest discomfort and breathing problems. Has appt 11/18 and is on the wait list but needs to discuss how to relief this anxiety.  Please call. Response: Note Rtc to patient, she does have the Viibryd and does remember that discussion. She has not been able to start that because she received a steroid injection a couple weeks ago and caused her stomach to be upset. She didn't want to start it knowing that's a side effect. She feels like the steroid injection may have worsened all her symptoms. She did try the trazodone but it was too much for her, even taking a 1/2 tablet. She does have some serzone left and has been taking that until this improves. Also taking Xanax as needed during day if needed. Hoping all of this will improve soon. Informed her I would update Dr. Clovis Carroll with her information.        04/01/2020 appointment with the following noted: Out of nefazodone a few weeks ago.  More reflux off it.  Tried trazodone a couple of times.  Tried 100 mg trazodone and heart skipped.  50 mg awoke with chest tightness.  Long haul Covid with GI sx.  Nausea and anxiety problems daily since mid September.  Covid early August and got monoclonal infusion which helped.  Headed to long haul Covid clinic in [place] Monday.   Needed to increase alprazolam to BID.  And 80% better with that.  Panic some out of bed.  Sx started after steroid shot for her back 9/21.   Hasn't tried Viibryd DT fear of GI  worsening.  Poor appetite and lost 15#. Not taken tramadol yet. Migraine under control. Plan: no med changes  06/01/20 appt with following noted: Starting generic Depakote ER generic today. Started Mirtazapine 3.75 mg and it worked right away for nausea for a week.  Then increased to 7.5 mg HS.  Most days is feeling really well.  No SE except mild drowsiness. Still has post Covid problems with stomach but much better.  Regained 7# of needed weight.  Need to try to gain 10# more. Zofran failed. Had a lot anxiety with N but it's better now.  Was waking with panic and no longer N and  anxiety better too. Severe scoliosis facing big surgery in summer with long recovery.  Can't ride in car for long. Plan: No med changes  08/18/2020 appointment with the following noted: Gained 12# and thankful for mirtazapine. Long Covid GI sx.  Better not gone with mirtazapine. Rare AM panic.  Sleep good. Scoliosis and may require surgery. Can't sit or stand for long Depakote brand ER 250 helps heartburn. Generic failed. Patient denies difficulty with sleep initiation with Xanax.. Denies appetite disturbance.  Patient reports that energy and motivation have been good.  Patient denies any difficulty with concentration.  Patient denies any suicidal ideation. More depressed and tearful with pain but varies with pain levels.  Good and bad days.  Not too depressed with anxiety worse. Plan: Continue mirtazapine 7.5 mg with Xanax for GI problems including nausea and appetite.  It has worked some.  Try higher dose. Insomnia managed with  With alprazolam.    11/18/2020 appointment with the following noted: Increased mirtazapine to 7.5 mg 1 and 1/2 tablet bc  less anxious with it. Facing big back surgery for severe scoliosis 11/30/20.  Can't drive in car for distance or travel. Now on Medicare. Depakote ER BRAND helped GI problems which were worse on generic. Not markedly depressed.  Patient reports stable mood and denies  depressed or irritable moods.   Patient denies difficulty with sleep initiation or maintenance. Denies appetite disturbance.  Patient reports that energy and motivation have been good.  Patient denies any difficulty with concentration.  Patient denies any suicidal ideation. Plan: No med changes Continue Depakote brand ER 250 mg daily, continue alprazolam 0.25 mg nightly, continue mirtazapine 7.5 mg nightly  04/26/2021 appointment with the following noted: Still on hydrocodone from surgery on back in July.  Driving.  Healing slowly. Pain before surgery is gone now.  Now pain related to adjusting to rod. But surgery was successful. Fritz Pickerel has been helpful. Has reduced mirtazapine 3.75 mg HS to try to come off it.  Sleep a little worse if doesn't take it.  Stomach problems are better.   No HA in a year but started having migraines in a year.  Migraines started after change in logo of brand Depakote so restarted Aimovig.  Depakote also helped acid reflux. Mirtazapine helped sleep and appetite.  Weight is good.  Sleep is good with it still. Mood is OK. Active at church and people are supportive. Continue Depakote brand ER 250 mg daily, continue alprazolam 0.25 mg nightly, continue mirtazapine 7.5 mg nightly  08/03/2021 appointment the following noted: Not doing so good.  Was doing great after surgery. A few weeks ago the nausea and anxiety came back. Had reduced mirtazapine to 3.75 mg HS, back up to 7.5 mg HS about 2 weeks.  No effects so far. Mad and depressed and not me.  Nausea coming back has been upsetting. Pursuing GI work up. Awakens with anxiety.   No sleep trouble. Conc worse. Plan:Was Much better with with anxiety, appetite and sleep on mirtazapine 7.5 mg tablet 1 nightly and relapsed when decreased to 1/tablet nightly,  so increase to 1 &  1/2 of the 7.5 mg tablets HS .   09/27/2021 appointment with the following noted: Last few weeks tough. Cholecystectomy 09/08/21 DT chronic nausea  but not better.  Yesterday no nausea for first time in months. Losing weight on bland diet. But it stopped. Chronic back pain after surgery. SE mirtazapine dry mouth. Usually feels ok in AM but afternoons gets nausea.  Xanax does help afternoon nausea so maybe it's anxiety. Exhausted from all of this.  Still blames some of it on covid. Not sleepy with Xanax.   Sad bc can't function normally.  Usually not depressed. Sleep is fine. Plan:Was Much better with with anxiety, appetite and sleep on mirtazapine 7.5 mg tablet 1 nightly initially.  Nausea, depression and anxiety Are back again. Option increase to 15 mg .  Yes.  10/26/2021 appointment with the following noted: It helped some.  Still some nausea.  CT scan next week.  Off and on nausea without pattern.  Not clearly anxiety but Xanax prn does help.  Frustrating that it is not predictable. Tolerated in crease in mirtazapine 15 with mild hangover.  Still some anxiety first thing in AM but not as bad as it was and is worse without Xanax.  No reason for the anxiety.   Not depressed but discouraged.  Tries to stay active and involved. Sleep is great always with Xanax. Chronic opiates DT back surgery for scoliosis Plan: Continue Xanax 0.25 mg BID for panic and GAD , but option increase TID bc helps nausea.  11/10/21 TC: Pt called reporting CT scan results fine. Requesting as discussed to change Mirtazepine to Olanzapine. MD: As discussed at the last office visit, if her GI work-up did not give a solution to her abdominal problems then it would be reasonable to try olanzapine at low dose.  I have sent in a prescription for 2.5 mg 1 nightly.  That is the lowest dose made.  If she uses a pill splitter she may try cutting it in half.  1/2 tablet may be sufficient for her.  She could experience benefit within the first day or 2 but give it a week or 2 as long as she is tolerating it to be sure. In order to minimize the risk of daytime sleepiness take  the olanzapine 2 to 3 hours before bedtime.  12/15/21 TC: Patient said she had increased the olanzapine and is still c/o headache and has had some dizziness today. She said she did "okay" on the mirtazapine. She said you had her on 1-1/2 tabs of 7.5. She said it probably isn't 100%, but felt she did okay otherwise.   01/11/22 appt noted: No solution to GI px.  Was told to get off hydrocodone but can't DT pain. Olanzapine tried with SE dizziness and HA after a month.  Without much benefit over mirtazapine. Overall ok.  Still some anxiety which doesn't want to eat but doing ok. Mirtazapine 7.5 mg HS helps. Sleep good.   Started [place], good. About the same with mood  with some depression over heallth problems. Sees surgeon in October about pain problems. No panic but awakens with anxiety and general worry.   Less nausea.  Saw neurologist for Migraine and had problems with the meds RX.  Aimovig has helped..    Past Psychiatric Medication Trials: Failed multiple other antidepressants,  Celexa chest pain Nortriptyline hyper Hydroxyzine SE  nefazodone & Depakote for migraine,  Depakote brand ER 250 helps heartburn. Generic failed. Mirtazapine 7.5 mg benefit Olanzapine 2.5 mg HS with SE dizziness and HA citalopram palpitations Trazodone Clonazepam did not help anxiety, alprazolam helps anxiety and sleep  Review of Systems:  Review of Systems  Constitutional:  Positive for appetite change.  Gastrointestinal:  Positive for abdominal pain and nausea.  Musculoskeletal:  Positive for arthralgias and back pain.  Neurological:  Negative for tremors and headaches.  Psychiatric/Behavioral:  Positive for depression. Negative for agitation, behavioral problems, confusion, decreased concentration, dysphoric mood, hallucinations, self-injury, sleep disturbance and suicidal ideas. The patient is nervous/anxious. The patient is not hyperactive.     Medications: I have reviewed the patient's  current medications.  Current Outpatient Medications  Medication Sig Dispense Refill   AIMOVIG 140 MG/ML SOAJ INJECT 140 MG INTO SKIN EVERY 30 DAYS 3 mL 3   ALPRAZolam (XANAX) 0.25 MG tablet TAKE 1 TABLET BY MOUTH 3 TIMES  DAILY AS NEEDED FOR ANXIETY 270 tablet 1   Ascorbic Acid (VITAMIN C PO) Take 500 mg by mouth daily.     Calcium Carbonate (CALCIUM 600 PO) Take 600-1,200 mg by mouth daily.     cholecalciferol (VITAMIN D3) 25 MCG (1000 UNIT) tablet Take 1,000 Units by mouth daily.     conjugated estrogens (PREMARIN) vaginal cream Place 1 Applicatorful vaginally 2 (two) times a week. 42.5 g 6   DEPAKOTE ER 250 MG 24 hr tablet Take 1 tablet (250 mg total) by mouth daily. (Patient taking differently: Take 250 mg by mouth at bedtime.) 90 tablet 3   gabapentin (NEURONTIN) 300 MG capsule Take 300 mg by mouth every evening.     HYDROcodone-acetaminophen (NORCO) 7.5-325 MG tablet Take 1.5 tablets by mouth every 6 (six) hours as needed for moderate pain.     Lactobacillus (ACIDOPHILUS) 100 MG CAPS Take 100 mg by mouth daily.     lansoprazole (PREVACID) 30 MG capsule Take 30 mg by mouth 2 (two) times daily before a meal.     methocarbamol (ROBAXIN) 750 MG tablet Take 750 mg by mouth 3 (three) times daily.     mirtazapine (REMERON) 7.5 MG tablet TAKE ONE AND ONE HALF TABLET BY MOUTH NIGHTLY (Patient taking differently: Take 2 tablets by mouth at bedtime. 1 tab at bedtime) 135 tablet 1   Multiple Vitamin (MULTIVITAMIN) tablet Take 1 tablet by mouth daily.     ondansetron (ZOFRAN) 4 MG tablet Take 1 tablet (4 mg total) by mouth daily as needed for nausea or vomiting. 30 tablet 1   polyethylene glycol (MIRALAX / GLYCOLAX) 17 g packet Take 17 g by mouth daily.     Probiotic Product (ALIGN) 4 MG CAPS Take 4 mg by mouth daily.     vitamin E 180 MG (400 UNITS) capsule Take 400 Units by mouth daily.     No current facility-administered medications for this visit.    Medication Side Effects:  None  Allergies:  Allergies  Allergen Reactions   Penicillins Other (See Comments)    Seizures as a child   Atarax [Hydroxyzine] Other (See Comments)    Headache, depression   Bactrim [Sulfamethoxazole-Trimethoprim] Nausea Only   Celexa [Citalopram Hydrobromide] Other (See Comments)    Chest pain   Erythromycin      Upset stomach   Flagyl [Metronidazole]     Dizzy and increased heart rate   Nortriptyline Itching   Orphenadrine     Hand tremors   Prilosec [Omeprazole]     Abdominal pain   Requip [Ropinirole]     Made sx worse   Darvocet [Propoxyphene N-Acetaminophen] Itching   Percocet [Oxycodone-Acetaminophen] Itching    Past Medical History:  Diagnosis Date   Allergy    Anxiety    Arthritis    Cervical dysplasia    Endometriosis    Fatigue    GERD (gastroesophageal reflux disease)    History of hiatal hernia    pt states she currently has a hiatal hernia  IBS (irritable bowel syndrome)    Migraine    Osteopenia    Osteoporosis 01/2019   T score -3.1   Osteoporosis    Plantar fasciitis    PONV (postoperative nausea and vomiting)    also difficult to wake up   Recurrent vaginitis    Reflux    Scoliosis    Wears glasses     Family History  Problem Relation Age of Onset   Cancer Father        lymphoma   Other Mother        bipolar,reflux   Bipolar disorder Mother    Other Brother        sinus problems   Cancer Maternal Aunt        uterine cancer   Breast cancer Maternal Aunt        40's   Diabetes Maternal Aunt    Cancer Paternal Aunt        Colon cancer   Breast cancer Cousin 71       Mat. 1st cousin    Social History   Socioeconomic History   Marital status: Married    Spouse name: Fritz Pickerel   Number of children: 0   Years of education: 16   Highest education level: Not on file  Occupational History    Comment: [employer]  Tobacco Use   Smoking status: Former    Types: Cigarettes    Quit date: 05/17/1983    Years  since quitting: 38.6   Smokeless tobacco: Never  Vaping Use   Vaping Use: Never used  Substance and Sexual Activity   Alcohol use: Not Currently   Drug use: No   Sexual activity: Not Currently    Birth control/protection: Post-menopausal    Comment: intercourse age 62 , sexual partners more than 5  Other Topics Concern   Not on file  Social History Narrative   Pt is married, no children.  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    Caffeine- very little.   Right handed   Lives at home with her husband   Social Determinants of Health   Financial Resource Strain: Not on file  Food Insecurity: Not on file  Transportation Needs: Not on file  Physical Activity: Not on file  Stress: Not on file  Social Connections: Not on file  Intimate Partner Violence: Not on file    Past Medical History, Surgical history, Social history, and Family history were reviewed and updated as appropriate.   Please see review of systems for further details on the patient's review from today.   Objective:   Physical Exam:  There were no vitals taken for this visit.  Physical Exam Constitutional:      General: She is not in acute distress. Musculoskeletal:        General: No deformity.  Neurological:     Mental Status: She is alert and oriented to person, place, and time.     Cranial Nerves: No dysarthria.     Coordination: Coordination normal.  Psychiatric:        Attention and Perception: Attention and perception normal. She does not perceive auditory or visual hallucinations.        Mood and Affect: Mood is anxious. Mood is not depressed. Affect is not labile, blunt, tearful or inappropriate.        Speech: Speech normal.        Behavior: Behavior normal. Behavior is cooperative.  Thought Content: Thought content normal. Thought content is not paranoid or delusional. Thought content does not include homicidal or suicidal ideation. Thought content does not include  suicidal plan.        Cognition and Memory: Cognition and memory normal.        Judgment: Judgment normal.     Comments: Insight intact Some residual anxiety chronically       Lab Review:     Component Value Date/Time   NA 140 08/31/2021 1100   NA 134 03/26/2020 1056   K 4.4 08/31/2021 1100   CL 103 08/31/2021 1100   CO2 31 08/31/2021 1100   GLUCOSE 98 08/31/2021 1100   BUN 7 (L) 08/31/2021 1100   BUN 8 03/26/2020 1056   CREATININE 0.73 08/31/2021 1100   CALCIUM 9.4 08/31/2021 1100   PROT 6.9 08/31/2021 1100   ALBUMIN 4.0 08/31/2021 1100   AST 18 08/31/2021 1100   ALT 13 08/31/2021 1100   ALKPHOS 36 (L) 08/31/2021 1100   BILITOT 0.6 08/31/2021 1100   GFRNONAA >60 08/31/2021 1100   GFRAA 91 03/26/2020 1056       Component Value Date/Time   WBC 4.2 08/31/2021 1100   RBC 4.18 08/31/2021 1100   HGB 12.8 08/31/2021 1100   HCT 40.1 08/31/2021 1100   PLT 205 08/31/2021 1100   MCV 95.9 08/31/2021 1100   MCH 30.6 08/31/2021 1100   MCHC 31.9 08/31/2021 1100   RDW 12.4 08/31/2021 1100   LYMPHSABS 0.7 12/21/2019 0949   MONOABS 0.6 12/21/2019 0949   EOSABS 0.0 12/21/2019 0949   BASOSABS 0.0 12/21/2019 0949    Normal liver enzymes in September.  No results found for: "POCLITH", "LITHIUM"   No results found for: "PHENYTOIN", "PHENOBARB", "VALPROATE", "CBMZ"   .res Assessment: Plan:    Mattalyn was seen today for follow-up, depression and anxiety.  Diagnoses and all orders for this visit:  Major depressive disorder, recurrent episode, moderate (HCC)  Panic disorder without agoraphobia  Nausea without vomiting  Generalized anxiety disorder  Insomnia due to mental condition  Migraine without aura and without status migrainosus, not intractable   Greater than 50% of 30 min non face to face time with patient was spent on counseling and coordination of care. We discussed the following. She's worse again with more physical problems  this is affecting mood.  Was  Much better with with anxiety, appetite and sleep on mirtazapine 7.5 mg tablet 1 nightly initially.  Nausea, depression and anxiety Are back again.  Continue mirtazapine 15 mg .  Yes. Ongoing GI workiup but if no results from it then suggest Consider olanzapine if doesn't get better. Discussed potential metabolic side effects associated with atypical antipsychotics, as well as potential risk for movement side effects. Advised pt to contact office if movement side effects occur.  Disc SE and nature of antipsychotics and antiemetics with DA etc. She may consider 2nd opinion.   Chronic acid reflux.  Cont Depakote for reflux.  Depakote ER BRAND helped GI problems which were worse on generic. HA worse off Aimovig so restarted.  Supportive therapy dealing with health problems.  Back and stomach pain and problems. Frustrated.    We discussed the short-term risks associated with benzodiazepines including sedation and increased fall risk among others.  Discussed long-term side effect risk including dependence, potential withdrawal symptoms, and the potential eventual d  ose-related risk of dementia. Disc newer studies that cast in doubt the relationship with dementia.  Not drowsy with alprazolam. Disc warnings  about combo of Bz and opiates not relevant at this low dose. BC morning anxiety could try switching to Valium and she doesn't want to try it. Continue Xanax 0.25 mg BID for panic and GAD , but option increase TID bc helps nausea.  No med changes  Consider Genesight testing bc med sensitivity and failures.  FU 3-4 mos  Lynder Parents, MD, DFAPA  Please see After Visit Summary for patient specific instructions.  Future Appointments  Date Time Provider [place]  01/26/2022 12:00 PM Shanon Ace, LCSW [facility] None  02/06/2022 10:00 AM Shanon Ace, LCSW [facility] None  02/21/2022 10:00 AM Shanon Ace, LCSW [facility] None  10/05/2022  2:00 PM Princess Bruins, MD [facility] None  10/24/2022  11:00 AM Debbora Presto, NP [facility] None  10/30/2022 11:00 AM Philemon Kingdom, MD [facility] None    No orders of the defined types were placed in this encounter.     -------------------------------

## 2022-01-23 ENCOUNTER — Other Ambulatory Visit: Payer: Self-pay | Admitting: Family Medicine

## 2022-01-23 DIAGNOSIS — Z1231 Encounter for screening mammogram for malignant neoplasm of breast: Secondary | ICD-10-CM

## 2022-01-26 ENCOUNTER — Ambulatory Visit: Payer: Medicare Other | Admitting: Psychiatry

## 2022-02-06 ENCOUNTER — Ambulatory Visit (INDEPENDENT_AMBULATORY_CARE_PROVIDER_SITE_OTHER): Payer: Medicare Other | Admitting: Psychiatry

## 2022-02-06 ENCOUNTER — Telehealth: Payer: Self-pay | Admitting: Psychiatry

## 2022-02-06 DIAGNOSIS — F331 Major depressive disorder, recurrent, moderate: Secondary | ICD-10-CM | POA: Diagnosis not present

## 2022-02-06 NOTE — Telephone Encounter (Signed)
Agreed.  Increase mirtazapine to 30 mg HS.  When needs refill, send as 30 mg tablet, 1 nightly. #90., no refill.

## 2022-02-06 NOTE — Telephone Encounter (Signed)
Pt has been taking 1.5 tabs of mirtazapine.She wants to increase to 2 for now and see how that goes then call us back.

## 2022-02-06 NOTE — Progress Notes (Signed)
[facility] Counselor/Therapist Progress Note  Patient ID: Yolanda Carroll, MRN: [id number],    Date: 02/06/2022  Time Spent: 55 minutes   Treatment Type: Individual Therapy  Reported Symptoms: frustrated with back pain after surgery, irritable, depressed anxious, angry  Mental Status Exam:  Appearance:   Casual     Behavior:  Appropriate, Sharing, and Motivated  Motor:  Affected by her back surgery  Speech/Language:   Clear and Coherent  Affect:  Anxious, depressed, angry, frustrated  Mood:  anxious, depressed, and irritable  Thought process:  goal directed  Thought content:    Some obsessive thoughts  Sensory/Perceptual disturbances:    WNL  Orientation:  oriented to person, place, time/date, situation, day of week, month of year, year, and stated date of Sept. 25, 2023  Attention:  Good  Concentration:  Good  Memory:  WNL  Fund of knowledge:   Good  Insight:    Good  Judgment:   Good  Impulse Control:  Good   Risk Assessment: Danger to Self:  No Self-injurious Behavior: No Danger to Others: No Duty to Warn:no Physical Aggression / Violence:No  Access to Firearms a concern: No  Gang Involvement:No   Subjective:  Patient in today reporting depression, anxiety, irritability, anger, difficulty concentrating all very much related to her ongoing back pain after significant surgery July 2022. To see her surgeon  again soon. "I'm a slow healer anyway". May need to cut back on church activities as she always leaves feeling more physical pain. Talked with husband about lifestyle change and needing him more. Works with gratitude listing and to continue as that is helpful to her. Using deep breathing exercises. Using meditative prayers often. Deep breathing exercises helpful. Spoke about some other concerns as well including "the biggest problem I have with husband is he never understood my mom's mental illness." Needed some time in session today to talk through and process her  issues with husband re: her mom's mental health concerns and patient's fears of developing similar symptoms.  Processed more apprehension/anxiety/depressive thoughts/fears.  Husband remains supportive although sometimes needs some direction.  Lacking predictability as to how she is going to feel the next few hours or the next day.  Did set some appropriate limits in regards to some of the church work she was doing and her minister was supportive.  Struggles with some of her faith including "my trusting God but sometimes I just feel forsaken".  States that she knows she is not forsaken and that supportive friends are very helpful.  It is to see her surgeon next week in [place].   Interventions: Cognitive Behavioral Therapy and Ego-Supportive  Long-term goal:  Elevate mood and show evidence of usual energy, activities, and socialization level. Short-term goal: Verbally identify the source of depressed mood and work to decrease its impact. Strategies: Identify cognitive self-talk that tends to support depression.  Verbalize hopeful and positive statements regarding her has not and future.  Diagnosis:   ICD-10-CM   1. Major depressive disorder, recurrent episode, moderate ([place])  F33.1      Plan:  Patient showing good motivation and participation in session today working further on her depression, anxiety, anger, and irritability mostly related to her on health issues, some occasional marital concerns, and today had some focus on her mother's history of behavioral health issues and how patient "fears being similar to her and there being a genetic connection".  Did very well in talking through a lot of very sensitive  issues.  Some teariness but also showing some strength.  [place] encouraging her healthy use of her support system of friends but also setting limits she feels is necessary for her in managing her pain.  Taking notes during session to help retain some of the things that we talk about in terms  of expressing her feelings but also in managing emotions, doubts, insecurities, and in communication with husband and others.  Encouraged patient in her practice of more positive behaviors including staying in frequent contact with friends and neighbors that are supportive and helpful, remain in the present focusing on what she can control or change, get outside some daily, remain on her prescribed medication, healthy nutrition and exercise as she is able, trying not to assume the negatives and worst case scenarios, remain involved in her church as that is a supportive network for her, refrain from [place], allow her faith to be an emotional support as well as spiritual, and realize the strength she shows working with goal directed behaviors to move in a direction that supports increased confidence, improved outlook, and improved emotional health.  Goal review and progress/challenges noted with patient.  Next appointment within 2 to 3 weeks.  This record has been created using [employer].  Chart creation errors have been sought, but may not always have been located and corrected.  Such creation errors do not reflect on the standard of medical care provided.   Shanon Ace, LCSW

## 2022-02-06 NOTE — Telephone Encounter (Signed)
Pt stated she is sleeping well but the mirtazapine was prescribed for nausea and it's not helping with that anymore.She is taking all meds as prescribed.She stated she is having increased depression and needs something to help.She stated Nortriptyline is not an option.

## 2022-02-06 NOTE — Telephone Encounter (Signed)
She is taking mirtazapine 15 mg nightly.  Mirtazapine is an antidepressant although the usual antidepressant dose is 30 mg daily.  It is a mild antidepressant.  Given her med sensitivity it would seem the simplest and safest option would be to increase mirtazapine to 30 mg nightly rather than start a new antidepressant.   However if she wants to start a new antidepressant the most obvious choice would be Cymbalta because of its potential benefit for pain and we would start with the lowest dose of 20 mg a day and after a week to 10 days if she tolerates it well go to 40 mg daily.  The usual dose is 60 mg and above.  Ask her which she would prefer and we could pursue whichever option she prefers.

## 2022-02-06 NOTE — Telephone Encounter (Signed)
Pt reporting she is very depressed. No thoughts of harming self or others. Taking Mirtazapine and not working as before. Stated she has a lot of pain issues and  that makes things harder to deal with. Apt 11/30. Sooner apt not available. Contact Pt @ [phone]

## 2022-02-15 LAB — LAB REPORT - SCANNED: EGFR: 78

## 2022-02-21 ENCOUNTER — Telehealth: Payer: Self-pay | Admitting: Psychiatry

## 2022-02-21 ENCOUNTER — Other Ambulatory Visit: Payer: Self-pay

## 2022-02-21 ENCOUNTER — Ambulatory Visit (INDEPENDENT_AMBULATORY_CARE_PROVIDER_SITE_OTHER): Payer: Medicare Other | Admitting: Psychiatry

## 2022-02-21 DIAGNOSIS — F331 Major depressive disorder, recurrent, moderate: Secondary | ICD-10-CM | POA: Diagnosis not present

## 2022-02-21 DIAGNOSIS — F5105 Insomnia due to other mental disorder: Secondary | ICD-10-CM

## 2022-02-21 DIAGNOSIS — F410 Panic disorder [episodic paroxysmal anxiety] without agoraphobia: Secondary | ICD-10-CM

## 2022-02-21 DIAGNOSIS — F32A Depression, unspecified: Secondary | ICD-10-CM

## 2022-02-21 DIAGNOSIS — F411 Generalized anxiety disorder: Secondary | ICD-10-CM

## 2022-02-21 MED ORDER — MIRTAZAPINE 7.5 MG PO TABS: ORAL_TABLET | ORAL | 0 refills | Status: DC

## 2022-02-21 NOTE — Telephone Encounter (Signed)
Rx sent 

## 2022-02-21 NOTE — Telephone Encounter (Signed)
Pt talked to the nurse and they increased her mirtazipine to 3 a day. She needs a new script sent in to the [facility] [address]

## 2022-02-21 NOTE — Progress Notes (Signed)
[facility] Counselor/Therapist Progress Note  Patient ID: Yolanda Carroll, MRN: [id number],    Date: 02/21/2022  Time Spent: 55 minutes   Treatment Type: Individual Therapy  Reported Symptoms: depression, some anxiety, frustrated, dealing with "almost-chronic" pain from significant back surgery, and only not hurting when she is lying down.  Mental Status Exam:  Appearance:   Casual     Behavior:  Appropriate, Sharing, and Motivated  Motor:  Walking carefully due to physical pain from prior back surgery.  Speech/Language:   Clear and Coherent  Affect:  Depressed and Flat  Mood:  angry, anxious, depressed, and irritable  Thought process:  goal directed  Thought content:    WNL  Sensory/Perceptual disturbances:    WNL  Orientation:  oriented to person, place, time/date, situation, day of week, month of year, year, and stated date of Oct. 10, 2023  Attention:  Good  Concentration:  Good  Memory:  WNL  Fund of knowledge:   Good  Insight:    Good  Judgment:   Good  Impulse Control:  Good   Risk Assessment: Danger to Self:  No Self-injurious Behavior: No Danger to Others: No Duty to Warn:no Physical Aggression / Violence:No  Access to Firearms a concern: No  Gang Involvement:No   Subjective: Patient in today reporting anxiety, depression, frustration, angry and irritable at times.  Lingering pain and impact from significant back surgery in July 2022 continues to be a serious stressor for patient.  Very little sustained relief in her pain.  "Not sure what helps".  Doristine Bosworth "urged congregation to journal: 3 praises for the day, 1 thing that I accomplished for the [place], and 1 thing that took away my joy." States this reminded her some of what we discussed last session and how her faith is helpful to her. Seeing pain management Dr who prescribed oral meds but patient still having considerable pain when up and about. Needing session today to process her depression, some anxiety,  and feelings that she is trying hard to deal with her medical situation and not really making much progress.  Encouraged her venting of all the feelings that she does not necessarily share with others and this seemed helpful to patient.  Also supported her in looking more closely at what really helps and what does not, and to practice what does help more often.  She was able to create a list of some helpful things, some things that she may refrain from doing, and also limit her time with those that may feel unhelpful to her, while prioritizing the time with her friends that are helpful especially a close friend or 2.  Has cut back on some of her activities including church due to trying to better manage her pain and add some weight which her doctor has told her might help better support "the hardware they had to put in my back during surgery".  Continues gratitude listing.  Using deep breathing exercises, meditative prayers.  Still concerned about husband's not understanding her mom's mental health concerns and patient worries about developing similar symptoms to her mother.  Husband has remained supportive of her during this time of prolonged physical pain.  Still has some struggles at times regarding her faith and "trusting that God is not forsaken her", later stating that she knows she is not forsaken but tends to have the doubts when the pain is worse.  Processed some of her faith-related issues more as her faith is an important part  in her trying to manage the physical/emotional part of her life.  Interventions: Cognitive Behavioral Therapy and Ego-Supportive  Long-term goal:  Elevate mood and show evidence of usual energy, activities, and socialization level. Short-term goal: Verbally identify the source of depressed mood and work to decrease its impact. Strategies: Identify cognitive self-talk that tends to support depression.  Verbalize hopeful and positive statements regarding her has not and  future.  Diagnosis:   ICD-10-CM   1. Major depressive disorder, recurrent episode, moderate ([place])  F33.1      Plan: Patient today showing active participation and motivation in session focusing on her depression, anxiety, frustration, anger, and irritability as all are related to her current situation and trying to manage high pain levels following an extensive back surgery last year and not progressing very much at this point.  Reportedly per her doctor, she is making progress in the healing but her pain persists and she is trying everything that is being suggested to her by surgeon and her other doctors.  Actually did well in session today processing some of how her journey is impacting her faith and how she is determined to hang onto it which is a positive thing for her.  Has several really good friends.  Has set some necessary boundaries in additional activities at church and beyond until her pain gets more manageable.  Encouraged to remain in touch with the friends that are the most helpful to her and continue following the advice of her doctors and the strategies that she uses (as noted above) to manage her physical pain symptoms and to positively impact her emotional/spiritual health. Encouraged patient and practicing positive behaviors as noted in session including: Remaining in contact with friends and neighbors that are supportive and helpful, stay in the present focusing what she can control or change, getting outside some daily, remaining on her prescribed medication, healthy nutrition and exercise as she is able, trying not to assume the negatives and worst case scenarios, remain involved in her church as that is a supportive network for her, refrain from [place], allow her faith to be an emotional support as well as spiritual, and recognize the strength she shows working with goal directed behaviors to move in a direction that supports increased confidence, improved outlook, improved  emotional health and overall wellbeing.  Review and progress/challenges noted with patient.  Next appointment within 2 to 3 weeks.  This record has been created using [employer].  Chart creation errors have been sought, but may not always have been located and corrected.  Such creation errors do not reflect on the standard of medical care provided.   Shanon Ace, LCSW

## 2022-02-22 ENCOUNTER — Other Ambulatory Visit: Payer: Self-pay | Admitting: Psychiatry

## 2022-02-22 DIAGNOSIS — K219 Gastro-esophageal reflux disease without esophagitis: Secondary | ICD-10-CM

## 2022-02-27 ENCOUNTER — Telehealth: Payer: Self-pay | Admitting: Psychiatry

## 2022-02-27 NOTE — Telephone Encounter (Signed)
Pt stated she is on pain meds that cause her to have nausea and increased anxiety.She is asking to be seen sooner but is already on the cancellation list.Any suggestions in the meantime?

## 2022-02-27 NOTE — Telephone Encounter (Signed)
Pt lvm that she is not doing well. She has not been able to eat . She is very depressed and struggling. Please call her at [phone]

## 2022-03-01 ENCOUNTER — Other Ambulatory Visit: Payer: Self-pay | Admitting: Psychiatry

## 2022-03-01 MED ORDER — OLANZAPINE 2.5 MG PO TABS: 2.5000 mg | ORAL_TABLET | Freq: Every day | ORAL | 1 refills | Status: DC

## 2022-03-01 MED ORDER — DULOXETINE HCL 20 MG PO CPEP
ORAL_CAPSULE | ORAL | 1 refills | Status: DC
Start: 2022-03-01 — End: 2022-05-02

## 2022-03-01 NOTE — Telephone Encounter (Signed)
Pt Lvm @ 10:32a.  She said she was waiting to hear back from the nurse after talking to Dr. Clovis Pu.  She said she is suffering from anxiety. Pls call her back.  Next appt 11/30

## 2022-03-01 NOTE — Telephone Encounter (Signed)
Pt informed

## 2022-03-01 NOTE — Telephone Encounter (Signed)
Please see messages.

## 2022-03-01 NOTE — Telephone Encounter (Signed)
Does not appear that she is receiving any benefit from mirtazapine.  Mentioned in my notes that the obvious antidepressant choice would be duloxetine because it will sometimes help with pain as well as depression and anxiety. Stop mirtazapine and start duloxetine 20 mg capsule 1 daily for 1 week then 2 capsules daily. It will take 2 to 4 weeks to get any benefit from the duloxetine.  I would suggest in the interim that she try something that is stronger to help with nausea and anxiety but is likely to be insufficiently helpful for depression, therefore the need for duloxetine. So in addition to the duloxetine start olanzapine at the lowest dose 2.5 mg tablet 1 nightly which could help with nausea appetite and anxiety almost right away.  If it causes too much sleepiness have her take it 2 to 3 hours before bedtime and she can even cut it in half if she has to. I will send in both prescriptions.

## 2022-03-06 ENCOUNTER — Telehealth: Payer: Self-pay | Admitting: Psychiatry

## 2022-03-06 NOTE — Telephone Encounter (Signed)
Patient lvm stating she is having side effects from switching the medication. She is requesting a return call to discuss.  Contact information [phone]

## 2022-03-06 NOTE — Telephone Encounter (Signed)
Ok to take duloxetine and mirtazapine instead of duloxetine and olanzapine.

## 2022-03-06 NOTE — Telephone Encounter (Signed)
On 10/16 you wanted pt to take olanzapine and Cymbalta to replace mirtazapine.She did not start the Cymbalta but did take olanzapine.She had side effects including insomnia,hand tremors,blurred vision and increased back pain.She decided to stop the med.She still wants to try Cymbalta but wants to know if it's ok to take with mirtazapine because she decided to start back taking it.

## 2022-03-07 ENCOUNTER — Ambulatory Visit (INDEPENDENT_AMBULATORY_CARE_PROVIDER_SITE_OTHER): Payer: Medicare Other | Admitting: Psychiatry

## 2022-03-07 DIAGNOSIS — F411 Generalized anxiety disorder: Secondary | ICD-10-CM | POA: Diagnosis not present

## 2022-03-07 NOTE — Telephone Encounter (Signed)
LVM with info and to rtc with any questions  

## 2022-03-07 NOTE — Progress Notes (Signed)
[facility] Counselor/Therapist Progress Note  Patient ID: Yolanda Carroll, MRN: [id number],    Date: 03/07/2022  Time Spent: 55 minutes   Treatment Type: Individual Therapy  Reported Symptoms: anxiety, depression, "anxiety the stronger symptom"  Mental Status Exam:  Appearance:   Casual     Behavior:  Appropriate, Sharing, and Motivated  Motor:  Normal  Speech/Language:   Clear and Coherent  Affect:  Depressed and anxious  Mood:  anxious and depressed  Thought process:  goal directed  Thought content:    Some obsessive thoughts  Sensory/Perceptual disturbances:    WNL  Orientation:  oriented to person, place, time/date, situation, day of week, month of year, year, and stated date of Oct. 24, 2023  Attention:  Fair/Good  Concentration:  Fair  Memory:  [place] of knowledge:   Good  Insight:    Good and Fair  Judgment:   Good  Impulse Control:  Good   Risk Assessment: Danger to Self:  No Self-injurious Behavior: No Danger to Others: No Duty to Warn:no Physical Aggression / Violence:No  Access to Firearms a concern: No  Gang Involvement:No   Subjective: Patient today reporting anxiety as primary symptom along with depression, anger, frustration, and decreased irritability.  Needed to process these emotionsPain from back surgery "no matter what I do".  To see pain management Dr. Gean Quint. Patient states she wonders if she has "long Covid" and shares why she feels this way. Did join info group from [facility] to be able to receive info from them. Brought in homework and reviewed together in session. Venting her frustrations and fears and also noting what helps including: walking outside, reading spiritual books, talking with friends, riding around in car, music, praying and going to church, talking with neighbor, texting friends, crochet. Journaling from her church: "3 praises", and "1 thing that took away my joy today". Hard to get sustained relief from her pain. Only  time she is not in pain is when she is in bed sleeping. "Been 15 months since my back surgery."  Encouraged her sharing all the thoughts and feelings she has and doesn't feel she wants to share with others. Long time history of anxiety and some depression. "Mom was that way too and I just don't want to be like her." Limiting time spent with unhelpful people,  cutting back on some church responsibilities and involvement. Gratitude listing continues. Continues deep breathing exercises, mediatation. Fears of being like her mom with long term "mental illness." Husband supportive.    Interventions: Cognitive Behavioral Therapy and Ego-Supportive   Long-term goal:  Elevate mood and show evidence of usual energy, activities, and socialization level. Short-term goal: Verbally identify the source of depressed mood and work to decrease its impact. Strategies: Identify cognitive self-talk that tends to support depression.  Verbalize hopeful and positive statements regarding her has not and future.   Diagnosis:   ICD-10-CM   1. Generalized anxiety disorder  F41.1      Plan: Active participation and motivation in session working on her depression, anger, frustration, and main symptom of anxiety. Still having lots of pain that has not really improved much. Only time not having pain is when asleep during the night but does sleep well during that time.  Needing to gain weight which Dr explained how that would add body muscle mass to help support her back and would help with her ongoing pain. Working with nutritionist to accomplish this goal but not progressing much  yet. Denies any SI. Supportive husband and friends helps, along with other resources as noted above. Encouraged to continue these as she is able and set limits as needed. Encouraged patient in her practice of positive behaviors as noted in sessions including: Staying in contact with friends and neighbors that are supportive and helpful, setting healthy  limits with others who are not so helpful, stay in the present focusing on what she can control her change, getting outside some daily, remaining on her prescribed medication, healthy nutrition and exercise as she is able, trying not to assume the negatives and worst-case scenarios, remain connected with her church as that is a supportive network for her, refrain from [place], allow her faith to be an emotional support as well as spiritual, and realize the strengths she shows working with goal-directed behaviors to move in a direction that supports an improved outlook and overall improved emotional health.  Goal review and progress/challenges noted with patient.  Next appointment within 2 to 3 weeks.  This record has been created using [employer].  Chart creation errors have been sought, but may not always have been located and corrected.  Such creation errors do not reflect on the standard of medical care provided.    Shanon Ace, LCSW

## 2022-03-08 ENCOUNTER — Ambulatory Visit: Payer: PRIVATE HEALTH INSURANCE

## 2022-03-10 ENCOUNTER — Encounter: Payer: Self-pay | Admitting: Family Medicine

## 2022-03-14 ENCOUNTER — Telehealth: Payer: Self-pay | Admitting: Gastroenterology

## 2022-03-14 NOTE — Telephone Encounter (Signed)
Hi Dr. Silverio Decamp,    We received a referral for this patient for Yolanda Carroll, drug-induced constipation. Patient patient states her [facility] doctor retired and would like to transfer her care to you. I am sending records for you to review. Please advise on schedule.   Thank you

## 2022-03-16 NOTE — Telephone Encounter (Signed)
I do not have any available appointment, please check with other providers in the practice that may have availability. Thanks

## 2022-03-16 NOTE — Telephone Encounter (Signed)
Hi Dr. [name],   I advised her of Dr.Nandigams availability and she would like to be seen with you. I sent the records for the patient to Dr.Nandigam, please advise on scheduling. Thank you.

## 2022-03-17 ENCOUNTER — Other Ambulatory Visit: Payer: Self-pay | Admitting: Psychiatry

## 2022-03-17 DIAGNOSIS — F5105 Insomnia due to other mental disorder: Secondary | ICD-10-CM

## 2022-03-17 DIAGNOSIS — F32A Depression, unspecified: Secondary | ICD-10-CM

## 2022-03-17 DIAGNOSIS — F410 Panic disorder [episodic paroxysmal anxiety] without agoraphobia: Secondary | ICD-10-CM

## 2022-03-17 DIAGNOSIS — F411 Generalized anxiety disorder: Secondary | ICD-10-CM

## 2022-03-20 ENCOUNTER — Ambulatory Visit (INDEPENDENT_AMBULATORY_CARE_PROVIDER_SITE_OTHER): Payer: Medicare Other | Admitting: Psychiatry

## 2022-03-20 DIAGNOSIS — F411 Generalized anxiety disorder: Secondary | ICD-10-CM

## 2022-03-20 NOTE — Progress Notes (Deleted)
[facility] Counselor/Therapist Progress Note  Patient ID: Yolanda Carroll, MRN: [id number],    Date: 03/20/2022  Time Spent: 55 minutes   Treatment Type: Individual Therapy  Reported Symptoms: anxiety, depressed  Mental Status Exam:  Appearance:   Casual     Behavior:  Appropriate, Sharing, and Motivated  Motor:  Affected by physical issues  Speech/Language:   Clear and Coherent  Affect:  anxious  Mood:  anxious  Thought process:  normal  Thought content:    Rumination  Sensory/Perceptual disturbances:    WNL  Orientation:  oriented to person, place, time/date, situation, day of week, month of year, year, and stated date of Nov. 6, 2023  Attention:  Good  Concentration:  Good  Memory:  WNL  Fund of knowledge:   Good  Insight:    Good  Judgment:   Good  Impulse Control:  Good   Risk Assessment: Danger to Self:  No Self-injurious Behavior: No Danger to Others: No Duty to Warn:no Physical Aggression / Violence:No  Access to Firearms a concern: No  Gang Involvement:No   Subjective:  Patient in today and reporting anxiety and "so desire to be well again and move forwards. Some anger, frustration, irritability due to physical and emotional struggles. Also having gastrointestinal issues. Frustrated with so many physical concerns. Concerns about physical issues with a couple of long time friends. Husband put our prayer request on FB. Still doing some PT.  Looked at what helps when she's struggling with her physical pain and her emotional pain. Enjoys being in chat group through [facility] as it feels supportive. Trying to stay in contact with supportive friends. "Still doing my  3 praises daily." Getting better about calling friends to come see her or talk on phone more."  What helps is: music, reading spiritual books, venting her frustrations and fears, talking with friends, praying and going to church, running around in car, talking with a good neighbor, texting friends,  and crochet.  This part of the day is usually what she is initially up and pain tends to worsen over the period of the day.  Explains how it is hard to get sustained relief from her pain even with her medications.  Has been almost 2 years since her back surgery.  Does occasionally mention her mom most sessions and how mom had "a really bad history of depression and I do not want to be like her".  Is setting some better limits with people that she finds unhelpful, and some of her previous church involvement only because of pain and uncertainty of when she is going to feel good.   Interventions: Cognitive Behavioral Therapy and Ego-Supportive  Long-term goal:  Elevate mood and show evidence of usual energy, activities, and socialization level. Short-term goal: Verbally identify the source of depressed mood and work to decrease its impact. Strategies: Identify cognitive self-talk that tends to support depression.  Verbalize hopeful and positive statements regarding her has not and future.   Diagnosis:   ICD-10-CM   1. Generalized anxiety disorder  F41.1       Plan:  Patient today showing active participation and good motivation even though she continues to deal with pain on an ongoing basis.  Did well in session today and talking through her frustrations and concerns and feeling heard which is a significant need for her understandably.  Continues her gratitude listing along with deep breathing exercises, meditation, and staying in touch with supportive friends.  Does continue  to also have fears of "being like my mom with long-term mental illness".  Talked about this more today and patient seemed to be a little less fearful by end of session and noticing the differences between her mother's illness and her on and also the difference in their experiences that precipitated their behavioral health symptoms.  Review of goal-directed behaviors to help her as she continues to work with her depression,  anxiety, frustration, and anger at times.  Also having some gastrointestinal issues checked on and is scheduled with a new doctor for that.  Pain level not very improved but has gained approximately 3 pounds which was another goal, and 1 in which her surgeon had felt that added weight and muscle mass might help her body support the area of the back where she had surgery and would result in less pain.  That did feel hopeful to patient and she plans to follow a diet with the help of a nutritionist that is recommended to her in order to add some calories to her daily intake.  Does get discouraged at times and tries to use some of the strategies mentioned above that have helped.  Denies any SI.  Continues to be supported by husband, friends, and other resources as noted above. Encouraged patient in practicing more positive behaviors as noted in session including: Remaining in contact with friends and neighbors that are supportive/helpful, setting healthy boundaries with others who are not so helpful, stay in the present focusing on what she can control or change, getting outside some each day, remaining on her prescribed medication, healthy nutrition and exercise as she is able, trying not to assume the negatives and worst-case scenarios, consider using some deep breathing exercises, remain connected with her church which is a supportive network for her, refrain from [place], allow her faith to be an emotional support as well as spiritual, and recognize the strength she shows working with goal-directed behaviors to move in a direction that supports her improved outlook and overall improved emotional health and wellbeing.  Goal review and progress/challenges noted with patient.  Next appointment within 2 weeks.  This record has been created using [employer].  Chart creation errors have been sought, but may not always have been located and corrected.  Such creation errors do not reflect on the standard of  medical care provided.   Shanon Ace, LCSW

## 2022-03-20 NOTE — Progress Notes (Signed)
[facility] Counselor/Therapist Progress Note  Patient ID: Yolanda Carroll, MRN: [id number],    Date: 03/20/2022  Time Spent: 55 minutes   Treatment Type: Individual Therapy  Reported Symptoms: anxiety, depressed  Mental Status Exam:  Appearance:   Casual     Behavior:  Appropriate, Sharing, and Motivated  Motor:  Affected by physical issues  Speech/Language:   Clear and Coherent  Affect:  anxious  Mood:  anxious  Thought process:  normal  Thought content:    Rumination  Sensory/Perceptual disturbances:    WNL  Orientation:  oriented to person, place, time/date, situation, day of week, month of year, year, and stated date of Nov. 6, 2023  Attention:  Good  Concentration:  Good  Memory:  WNL  Fund of knowledge:   Good  Insight:    Good  Judgment:   Good  Impulse Control:  Good   Risk Assessment: Danger to Self:  No Self-injurious Behavior: No Danger to Others: No Duty to Warn:no Physical Aggression / Violence:No  Access to Firearms a concern: No  Gang Involvement:No   Subjective:  Patient in today and reporting anxiety and "so desire to be well again and move forwards. Some anger, frustration, irritability due to physical and emotional struggles. Also having gastrointestinal issues. Frustrated with so many physical concerns. Concerns about physical issues with a couple of long time friends. Husband put our prayer request on FB. Still doing some PT.  Looked at what helps when she's struggling with her physical pain and her emotional pain. Enjoys being in chat group through [facility] as it feels supportive. Trying to stay in contact with supportive friends. "Still doing my  3 praises daily." Getting better about calling friends to come see her or talk on phone more."  What helps is: music, reading spiritual books, venting her frustrations and fears, talking with friends, praying and going to church, running around in car, talking with a good neighbor, texting friends,  and crochet.  This part of the day is usually what she is initially up and pain tends to worsen over the period of the day.  Explains how it is hard to get sustained relief from her pain even with her medications.  Has been almost 2 years since her back surgery.  Does occasionally mention her mom most sessions and how mom had "a really bad history of depression and I do not want to be like her".  Is setting some better limits with people that she finds unhelpful, and some of her previous church involvement only because of pain and uncertainty of when she is going to feel good.   Interventions: Cognitive Behavioral Therapy and Ego-Supportive  Long-term goal:  Elevate mood and show evidence of usual energy, activities, and socialization level. Short-term goal: Verbally identify the source of depressed mood and work to decrease its impact. Strategies: Identify cognitive self-talk that tends to support depression.  Verbalize hopeful and positive statements regarding her has not and future.   Diagnosis:   ICD-10-CM   1. Generalized anxiety disorder  F41.1       Plan:  Patient today showing active participation and good motivation even though she continues to deal with pain on an ongoing basis.  Did well in session today and talking through her frustrations and concerns and feeling heard which is a significant need for her understandably.  Continues her gratitude listing along with deep breathing exercises, meditation, and staying in touch with supportive friends.  Does continue  to also have fears of "being like my mom with long-term mental illness".  Talked about this more today and patient seemed to be a little less fearful by end of session and noticing the differences between her mother's illness and her on and also the difference in their experiences that precipitated their behavioral health symptoms.  Review of goal-directed behaviors to help her as she continues to work with her depression,  anxiety, frustration, and anger at times.  Also having some gastrointestinal issues checked on and is scheduled with a new doctor for that.  Pain level not very improved but has gained approximately 3 pounds which was another goal, and 1 in which her surgeon had felt that added weight and muscle mass might help her body support the area of the back where she had surgery and would result in less pain.  That did feel hopeful to patient and she plans to follow a diet with the help of a nutritionist that is recommended to her in order to add some calories to her daily intake.  Does get discouraged at times and tries to use some of the strategies mentioned above that have helped.  Denies any SI.  Continues to be supported by husband, friends, and other resources as noted above. Encouraged patient in practicing more positive behaviors as noted in session including: Remaining in contact with friends and neighbors that are supportive/helpful, setting healthy boundaries with others who are not so helpful, stay in the present focusing on what she can control or change, getting outside some each day, remaining on her prescribed medication, healthy nutrition and exercise as she is able, trying not to assume the negatives and worst-case scenarios, consider using some deep breathing exercises, remain connected with her church which is a supportive network for her, refrain from [place], allow her faith to be an emotional support as well as spiritual, and recognize the strength she shows working with goal-directed behaviors to move in a direction that supports her improved outlook and overall improved emotional health and wellbeing.  Goal review and progress/challenges noted with patient.  Next appointment within 2 weeks.  This record has been created using [employer].  Chart creation errors have been sought, but may not always have been located and corrected.  Such creation errors do not reflect on the standard of  medical care provided.   Shanon Ace, LCSW

## 2022-03-21 ENCOUNTER — Encounter: Payer: Self-pay | Admitting: Gastroenterology

## 2022-03-21 NOTE — Telephone Encounter (Signed)
Patient was scheduled for OV with Dr. [name] on 12/18 at 1:30

## 2022-03-21 NOTE — Telephone Encounter (Signed)
Patient called to follow up on that transfer of care. Please  advise on scheduling.

## 2022-03-29 ENCOUNTER — Encounter: Payer: Self-pay | Admitting: *Deleted

## 2022-03-31 ENCOUNTER — Encounter: Payer: Self-pay | Admitting: Internal Medicine

## 2022-03-31 ENCOUNTER — Ambulatory Visit (INDEPENDENT_AMBULATORY_CARE_PROVIDER_SITE_OTHER): Payer: Medicare Other | Admitting: Internal Medicine

## 2022-03-31 VITALS — BP 96/60 | HR 74 | Ht 66.0 in | Wt 119.0 lb

## 2022-03-31 DIAGNOSIS — M810 Age-related osteoporosis without current pathological fracture: Secondary | ICD-10-CM | POA: Diagnosis not present

## 2022-03-31 NOTE — Progress Notes (Signed)
Patient ID: Yolanda Carroll, female   DOB: November 06, 1955, 66 y.o.   MRN: [id number]   HPI  Yolanda Carroll is a 66 y.o.-year-old female, initially referred by Dr. Phineas Real, presenting for follow-up for osteoporosis.  Last visit 5 months ago.  She is here with her husband.  Interim history: She has a history of severe scoliosis for which she had steroid injections in back due to significant back pain.  She had  T4-ilium fusion surgery in 12/01/2020 by Dr. Margarita Mail in [place]. She was in physical therapy afterwards.  At last visit, she felt better after the surgery, with only residual pain.  She was exercising: Water aerobics, walking.  However, since then, her pain is exacerbated and she is barely able to exercise now.  She has muscle pain along her spine but also burning at the site of her surgery.  She is on Neurontin. She lost weight due to the pain.  She is working with nutrition.  She is on a high-protein 2000-calorie diet.  This is difficult for her to follow as she does not have an appetite.  She is worried that this diet may conflict with the alkaline diet that I recommended at last visits, since it include meats.  She checked a urine pH recently and this was 7. She denies dizziness/orthostasis/poor vision.   No falls or fractures since last visit.  Reviewed history: She was diagnosed with osteoporosis in early 2000.  Reviewed patient's DXA scan reports: Date L1-L4 T score FN T score 33% distal Radius (left)  Ultra distal radius (left)  10/19/2020 ([facility]) N/a RFN: -2.8 (+5%) LFN: -2.5 (-1.4%) +0.4 (+4%) N/a  02/11/2019 ([facility]) N/a due to scoliosis RFN: -3.1 (-8.2%*) LFN: -2.4 (-4.7%)  -0.1 (-9.6%*)  0.0  02/06/2017 ([facility]) -0.3 (moderate scoliosis) RFN: -2.6 LFN: -2.2  +1.1 -0.1  12/29/2014 -0.5 (moderate scoliosis) RFN: -2.6 LFN: -1.7 n/a n/a  12/12/2012 -0.5 (moderate scoliosis) RFN: -2.4 LFN: -1.9 n/a n/a  10/04/2010 n/a RFN: -2.3 LFN: -1.8 n/a n/a   She has  a history of steroid use: P.o. and IM for headaches, intraspinal for OA.  She had one episode of vertigo for 2 weeks in 2018.   Reviewed previous osteoporosis treatments: - Actonel in 2006-2007 - Forteo for 2 years in 2008 - Boniva in 2010 - HRT: Estradiol 0.5, Provera 2.5 - now off - Prolia -05/02/2019, 11/11/2019, 05/13/2020, 11/16/2020, 05/31/2021, 12/06/2021.  No history of vitamin D deficiency: 02/15/2022: Vitamin D 55.8 02/10/2021: Vitamin D 65.2 Lab Results  Component Value Date   VD25OH 75.3 11/11/2020   VD25OH 49.3 12/04/2019   VD25OH 70.27 03/04/2019   VD25OH 55 03/13/2017  02/02/2020: Vitamin D 68.8 02/11/2018: Vit D 61.9 04/30/2017: vit D 53.4  She is on: - calcium citrate >> 250 mg 1-2 a day >> 500-600 mg daily >> off  - Vitamin D >> approximately 2000 units daily (15,000 units a week) >> off  No weightbearing exercises.   She is not taking high vitamin A doses.  Menopause was at 63s y/o.   Pt does have a FH of osteoporosis in mother, who had a hip fracture.  No history of kidney stones or hyperparathyroidism: 02/15/2022: corrected calcium 8.69 02/02/2020: corrected calcium 8.74 (8.6-10.3) 02/11/2018: Ca normal  No history of thyrotoxicosis: 02/15/2022: TSH 2.15 02/10/2021: TSH 3.9 02/02/2020: TSH 2.53 02/11/2018: TSH 2.79 05/01/2017: TSH 1.71 04/28/2013: TSH 2.063 No results found for: "TSH"   No CKD. Last BUN/Cr: 02/15/2022: 7/0.83, GFR 78 Lab Results  Component Value Date   BUN 7 (L) 08/31/2021   CREATININE 0.73 08/31/2021   On Depakote for migraines. On opioids and Neurontin for back pain. She sees pain management.  She has anxiety and sees Dr. Clovis Pu.  ROS: + See HPI  I reviewed pt's medications, allergies, PMH, social hx, family hx, and changes were documented in the history of present illness. Otherwise, unchanged from my initial visit note.  Past Medical History:  Diagnosis Date   Allergy    Anxiety    Arthritis    Cervical dysplasia     Endometriosis    Fatigue    GERD (gastroesophageal reflux disease)    History of hiatal hernia    pt states she currently has a hiatal hernia   IBS (irritable bowel syndrome)    Migraine    Osteopenia    Osteoporosis 01/2019   T score -3.1   Osteoporosis    Plantar fasciitis    PONV (postoperative nausea and vomiting)    also difficult to wake up   Recurrent vaginitis    Reflux    Scoliosis    Wears glasses    Past Surgical History:  Procedure Laterality Date   ANTERIOR CERVICAL DECOMP/DISCECTOMY FUSION  04/17/2011   Procedure: ANTERIOR CERVICAL DECOMPRESSION/DISCECTOMY FUSION 2 LEVELS;  Surgeon: Hosie Spangle;  Location: [facility];  Service: Neurosurgery;  Laterality: N/A;  Cervical five-six, six-seven anterior cervical decompression with fusion,  plating,  and bonegraft    APPENDECTOMY  1978   BACK SURGERY N/A    BREAST BIOPSY  07/13/2011   Procedure: BREAST BIOPSY WITH NEEDLE LOCALIZATION;  Surgeon: Rolm Bookbinder, MD;  Location: [facility];  Service: General;  Laterality: Right;  Right breast wire localization biopsy   BREAST EXCISIONAL BIOPSY Right 2013   BREAST SURGERY     Breast Bx-Benign   CHOLECYSTECTOMY N/A 09/08/2021   Procedure: LAPAROSCOPIC CHOLECYSTECTOMY;  Surgeon: Erroll Luna, MD;  Location: [facility];  Service: General;  Laterality: N/A;   [place]  08/02/1995   RIH   LAPAROSCOPIC ENDOMETRIOSIS FULGURATION  1997   PELVIC LAPAROSCOPY     ROTATOR CUFF REPAIR     right 2002 left 2000   Social History   Socioeconomic History   Marital status: Married    Spouse name: Fritz Pickerel   Number of children: 0   Years of education: [place]: [employer], academic - [employer]  Tobacco Use   Smoking status: Former Smoker    Last attempt to quit: 05/17/1983    Years since quitting: 34.0   Smokeless tobacco: Never Used  Substance and  Sexual Activity   Alcohol use: Yes    Alcohol/week: 0.6 oz    Types: 1-2 Standard drinks or equivalent per week    Comment: socially, occasional   Drug use: No   Sexual activity: Yes    Birth control/protection: Post-menopausal    Comment: intercourse age 66 , sexual partners more than 5  Other Topics Concern   Not on file  Social History Narrative   Pt is married, no children.  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    Caffeine- very little.   Current Outpatient Medications on File Prior to Visit  Medication Sig Dispense Refill   AIMOVIG 140 MG/ML SOAJ INJECT 140 MG INTO SKIN EVERY 30 DAYS 3 mL 3   ALPRAZolam (XANAX)  0.25 MG tablet TAKE 1 TABLET BY MOUTH 3 TIMES  DAILY AS NEEDED FOR ANXIETY 270 tablet 1   Ascorbic Acid (VITAMIN C PO) Take 500 mg by mouth daily.     Calcium Carbonate (CALCIUM 600 PO) Take 600-1,200 mg by mouth daily.     cholecalciferol (VITAMIN D3) 25 MCG (1000 UNIT) tablet Take 1,000 Units by mouth daily.     conjugated estrogens (PREMARIN) vaginal cream Place 1 Applicatorful vaginally 2 (two) times a week. 42.5 g 6   DEPAKOTE ER 250 MG 24 hr tablet TAKE 1 TABLET BY MOUTH DAILY 90 tablet 3   DULoxetine (CYMBALTA) 20 MG capsule 1 daily for 1 week then 2 capsules daily 60 capsule 1   gabapentin (NEURONTIN) 300 MG capsule Take 300 mg by mouth every evening.     HYDROcodone-acetaminophen (NORCO) 7.5-325 MG tablet Take 1.5 tablets by mouth every 6 (six) hours as needed for moderate pain.     Lactobacillus (ACIDOPHILUS) 100 MG CAPS Take 100 mg by mouth daily.     lansoprazole (PREVACID) 30 MG capsule Take 30 mg by mouth 2 (two) times daily before a meal.     methocarbamol (ROBAXIN) 750 MG tablet Take 750 mg by mouth 3 (three) times daily.     Multiple Vitamin (MULTIVITAMIN) tablet Take 1 tablet by mouth daily.     OLANZapine (ZYPREXA) 2.5 MG tablet Take 1 tablet (2.5 mg total) by mouth at bedtime. 30 tablet 1   ondansetron (ZOFRAN) 4 MG tablet Take 1  tablet (4 mg total) by mouth daily as needed for nausea or vomiting. 30 tablet 1   polyethylene glycol (MIRALAX / GLYCOLAX) 17 g packet Take 17 g by mouth daily.     Probiotic Product (ALIGN) 4 MG CAPS Take 4 mg by mouth daily.     vitamin E 180 MG (400 UNITS) capsule Take 400 Units by mouth daily.     No current facility-administered medications on file prior to visit.   Allergies  Allergen Reactions   Penicillins Other (See Comments)    Seizures as a child   Atarax [Hydroxyzine] Other (See Comments)    Headache, depression   Bactrim [Sulfamethoxazole-Trimethoprim] Nausea Only   Celexa [Citalopram Hydrobromide] Other (See Comments)    Chest pain   Erythromycin      Upset stomach   Flagyl [Metronidazole]     Dizzy and increased heart rate   Nortriptyline Itching   Orphenadrine     Hand tremors   Prilosec [Omeprazole]     Abdominal pain   Requip [Ropinirole]     Made sx worse   Darvocet [Propoxyphene N-Acetaminophen] Itching   Percocet [Oxycodone-Acetaminophen] Itching   Family History  Problem Relation Age of Onset   Cancer Father        lymphoma   Other Mother        bipolar,reflux   Bipolar disorder Mother    Other Brother        sinus problems   Cancer Maternal Aunt        uterine cancer   Breast cancer Maternal Aunt        40's   Diabetes Maternal Aunt    Cancer Paternal Aunt        Colon cancer   Breast cancer Cousin 9       Mat. 1st cousin   PE: BP 96/60   Pulse 74   Ht '5\' 6"'$  (1.676 m)   Wt 119 lb (54 kg)   SpO2 99%  BMI 19.21 kg/m  Wt Readings from Last 3 Encounters:  03/31/22 119 lb (54 kg)  10/20/21 116 lb 8 oz (52.8 kg)  10/19/21 115 lb 12.8 oz (52.5 kg)   Constitutional:  thin, in NAD Eyes: no exophthalmos ENT: no thyromegaly, no cervical lymphadenopathy Cardiovascular: RRR, No MRG Respiratory: CTA B Musculoskeletal: no deformities - no scoliosis Skin: moist, warm, no rashes Neurological: no tremor with outstretched  hands  Assessment: 1. Osteoporosis  Plan: 1. Osteoporosis -Likely age-related + postmenopausal + she also has family history of osteoporosis -I reviewed her bone density reports from 01/2018 and 01/2019 and it appears that both her femoral neck T-scores have decreased while only the right femoral neck decrease was significant.  She also has a decreased bony density at the level of her radius. Spine could not be analyzed in 2020 due to scoliosis.  After these results returned, she agreed to start Prolia.  The latest bone density scan is from 10/19/2020 and this showed stability of her T-scores. -First Prolia injection was in 04/2019.  She had back pain but this is chronic for her and we discussed that this was unlikely to be related to Prolia. -Afterwards, she was able to undergo scoliosis surgery.  At last visit, she still had pain, but it was improving.  However, at this visit, she describes that her pain worsened since last visit and it is difficult for her to function.  She does some water exercises, but not consistently.  She also lost weight and is wondering whether the diet that she is on now (high-calorie, high-protein) is completing with the diet that I recommended at last visit, and alkaline diet.  I advised her that for now, the focus is to gain weight and also build the muscles around her spine to be able to maintain the spine architecture.  The urine pH of 7 is at goal.  - She is in physical therapy and I advised her to try to increase weightbearing exercises, which would benefit her osteoporosis, also, after checking with neurosurgery. -At previous visits she was contemplating stopping osteoporosis medications but I recommended against this.  -Since last visit, she had an injection in 11/2021.  She feels that she may have had more pain afterwards, but this coincided with her being off pain medicines. -Otherwise, she tolerates Prolia well, without hip/jaw/thigh pain -for now I advised her to  continue -Latest vitamin D level was normal in 01/2021: 65.9, on 15,000 units vitamin D daily.  She tells me that she is currently off her vitamin D.  She brings a vitamin D level of 55 recently checked by PCP (02/2022).  This appears to be decreasing slowly, due to the long half-life of vitamin D.  To avoid decreasing further, I advised her to add 1000 units vitamin D daily. -At last visit I recommended to get at least 1200 mg calcium from the diet and if she could not obtain this, to supplement with calcium citrate, since she is on an H2 blocker which decreases the absorption of calcium carbonate. -Calcium and kidney function were normal in 02/2022 per review of labs brought by patient (will scan) -Plan is to continue Prolia for at least 10 years; plan to obtain another bone density next year -I will see her back in 1 year  - Total time spent for the visit: 30 min, in precharting, reviewing Dr. Cordelia Pen last note, obtaining medical information from the chart and from the pt, reviewing her  previous labs, evaluations, and treatments,  reviewing her symptoms, counseling her about her osteoporosis (please see the discussed topics above), and developing a plan to further treat it; she had a number of questions which I addressed.  Philemon Kingdom, MD PhD [facility] Endocrinology

## 2022-03-31 NOTE — Patient Instructions (Addendum)
Please continue with Prolia as discussed.  Restart vitamin D 1000 units daily.   If you are not getting at least 1200 mg calcium from the diet, add calcium citrate 500 mg daily.  Try to build up weight bearing exercises.  Please come back for a follow-up appointment in 1 year.

## 2022-04-04 ENCOUNTER — Ambulatory Visit (INDEPENDENT_AMBULATORY_CARE_PROVIDER_SITE_OTHER): Payer: Medicare Other | Admitting: Psychiatry

## 2022-04-04 DIAGNOSIS — F411 Generalized anxiety disorder: Secondary | ICD-10-CM

## 2022-04-04 NOTE — Progress Notes (Signed)
[facility] Counselor/Therapist Progress Note  Patient ID: Yolanda Carroll, MRN: [id number],    Date: 04/04/2022  Time Spent: 55 minutes   Treatment Type: Individual Therapy  Reported Symptoms: anxiety, depression (spoke with surgeon's office and got some gabapentin to help with pain)  Mental Status Exam:  Appearance:   Casual and Neat     Behavior:  Appropriate, Sharing, and Motivated  Motor:  Affected by her back pain  Speech/Language:   Clear and Coherent  Affect:  Anxious, depressed  Mood:  anxious and depressed  Thought process:  goal directed  Thought content:    WNL  Sensory/Perceptual disturbances:    WNL  Orientation:  oriented to person, place, time/date, situation, day of week, month of year, year, and stated date of Nov. 21, 2023  Attention:  Fair  Concentration:  Fair  Memory:  [place] of knowledge:   Good  Insight:    Good  Judgment:   Good  Impulse Control:  Good   Risk Assessment: Danger to Self:  No Self-injurious Behavior: No Danger to Others: No Duty to Warn:no Physical Aggression / Violence:No  Access to Firearms a concern: No  Gang Involvement:No   Subjective: Patient today reports anxiety and depression with continued back pain issues. Surgeon did prescribe Gabapentin for pain recently. Some sadness in giving up things she enjoyed doing (giving up due to pain persisting.). Looking for other activities that she can do.Hard to be dependent on others after being so independent. Involved in chat group through [facility] for support. Not looking quite as "down" today in facial expressions. Has backed off some responsibility at church which is good for her. Has been good about reaching out to others for help/support. States "I'm controlling my anger and irritability" better and trying to focus more "on the good", reading more helps. Gastrointestinal issues better. Positive--sees new GI doctor in mid-December. Still doing some PT at home and walking.  Can help mood some also. In contact often with supportive friends. Helpful outlets include: "phone calls, visits from friends, texting, reading, music, crochet, using tens unit." Mother had hx of depression and "I don't want to be like her." Continues to try setting limits when people are unhealthy.   Interventions: Cognitive Behavioral Therapy and Ego-Supportive  Long-term goal:  Elevate mood and show evidence of usual energy, activities, and socialization level. Short-term goal: Verbally identify the source of depressed mood and work to decrease its impact. Strategies: Identify cognitive self-talk that tends to support depression.  Verbalize hopeful and positive statements regarding her has not and future  Diagnosis:   ICD-10-CM   1. Generalized anxiety disorder  F41.1      Plan: Patient continues work with goal-directed behavior and inching towards more progress but challenged by her physical pain in back, post-surgery. Also processing prior losses including mom's death 8 yrs ago (bipolar). Thinks about these things more when she is alone. To see new GI dr next month. Continues to use local support system with benefit and although still having some pain, she does appear more alert and some improved energy today but still struggles.  Continues gratitude listing.  Making progress using goal-directed behaviors and needs continued therapy to realize more progress and continue and the more positive direction that she is trying to head. Encouraged patient in her practice of positive behaviors as noted in session including: Staying in contact with friends and neighbors that are supportive/helpful, setting healthy boundaries with others, remain in the  present focusing on what she can control or change, trying to get outside some each day, remain on her prescribed medication, healthy nutrition and exercise as she is able, trying not to assume the negatives and worst-case scenarios, consider using some deep  breathing exercises, remain in contact with her church which is a very supportive network for her, refrain from overthinking, allow her faith to be an emotional support as well as spiritual, and realize the strength she shows working with goal-directed behaviors to move in a direction that supports her improved emotional health and overall outlook.  Goal review and progress/challenges noted with patient.  Next appointment within 2-3 weeks.  This record has been created using [employer].  Chart creation errors have been sought, but may not always have been located and corrected.  Such creation errors do not reflect on the standard of medical care provided.   Shanon Ace, LCSW

## 2022-04-13 ENCOUNTER — Ambulatory Visit (INDEPENDENT_AMBULATORY_CARE_PROVIDER_SITE_OTHER): Payer: Medicare Other | Admitting: Psychiatry

## 2022-04-13 ENCOUNTER — Encounter: Payer: Self-pay | Admitting: Psychiatry

## 2022-04-13 DIAGNOSIS — F5105 Insomnia due to other mental disorder: Secondary | ICD-10-CM

## 2022-04-13 DIAGNOSIS — F331 Major depressive disorder, recurrent, moderate: Secondary | ICD-10-CM | POA: Diagnosis not present

## 2022-04-13 DIAGNOSIS — R110 Nausea: Secondary | ICD-10-CM

## 2022-04-13 DIAGNOSIS — G43009 Migraine without aura, not intractable, without status migrainosus: Secondary | ICD-10-CM

## 2022-04-13 DIAGNOSIS — F411 Generalized anxiety disorder: Secondary | ICD-10-CM | POA: Diagnosis not present

## 2022-04-13 DIAGNOSIS — F410 Panic disorder [episodic paroxysmal anxiety] without agoraphobia: Secondary | ICD-10-CM | POA: Diagnosis not present

## 2022-04-13 NOTE — Progress Notes (Signed)
Yolanda Carroll [id number] Mar 26, 1956 66 y.o.   Subjective:   Patient ID:  Yolanda Carroll is a 66 y.o. (DOB 08/11/1955) female.  Chief Complaint:  Chief Complaint  Patient presents with   Follow-up   Depression   Anxiety    Anxiety Symptoms include nausea and nervous/anxious behavior. Patient reports no confusion, decreased concentration or suicidal ideas.    Depression        Associated symptoms include appetite change.  Associated symptoms include no decreased concentration, no headaches and no suicidal ideas.  Past medical history includes anxiety.    Yolanda Carroll presents to the office today for follow-up of anxiety and migraine.  seen in July 2020.  No meds were changed.  She requires name brand Depakote because the generic caused worsening headaches.  Retired November 14, 2018.  Was so burned out at work.  Helping with church projects and helping friends.  Focusing on her health and doing PT for back and hip.  No kids or gkids.  Helps care for 43 yo M-in-law.  H is also busy which helps.  Given a surprise retirement party.     seen January 2021.  Nefazodone is no longer manufactured and she had to wean off.  We also discussed the potential of weaning off Depakote because Aimovig had been very effective at managing her migraine headache.  She called August 11, 2019 stating that she did taper off of Depakote because she did not feel like she needed it any more.  Her last dose was July 23, 2019 but afterwards noticed heartburn and swallowing issues.  Her GI doctor added Pepcid 40 mg daily.  Her neurologist had indicated that Depakote can sometimes help with esophageal issues and she wondered if that was connected.  She was given the option to restart a lower dose and perhaps taper more slowly.  Off Depakote for a month.  She elected not to restart it.  08/19/2019 appointment the following is noted: Tinnitus and mild anxiety worse off the Depakote and with some mind racing esp  in the AM.   Most is better. Reflux is worse off Depakote ER 250 and wondering if she should restart it.   Disc article on Depakote helping GERD by increasing lower esophageal sphincter tone. Has travelled some with family events.  M-in-law 48 soon.   HA good with Aimovig. Don't do season changes well and more HA in Spring often. Sleep not great lately with leg cramps.  Started more exercise.  Started snoring and H is a light sleeper.  Meds help. Plan no med changes.  10/03/2019 phone call with patient asking to restart residual nefazodone that she has on hand.  She felt that it helped back pain and she is experiencing more back pain recently.  She is aware that it is no longer manufactured to our knowledge and once she runs out it will not be available.  She indicated she had an off to take half of 150 mg nefazodone tablet for about 2 to 3 months and would like to do so.  11/05/2019 appointment with the following noted:  Appt moved earlier DT the following. Had stopped Depakote and nefazadone and reflux got worse.  Restarting Depakote didn't help much.  Restarted nefazodaone and reflux better right away.  May not need Depakote but doesn't want to change.  Can't exercise DT back pain since Jan ans worse since March.  Getting new doctor.  More depressed and anxious and sleep problems DT back pain.  Usually only 0.25 mg alprazolam at night. Plan: has to stop nefazodone when it runs out bc no longer manufactured.  Therefore will try trazodone in it's place.  01/07/20 appt with the following noted: She and H both got Covid.  Was pretty sick for 10 days.  Lost taste.  Had little resp stuff but severe diarrhea and nausea and couldn't keep fluids down.  No residual sx. H had resp sx with pneumonia and had to get O2.  Hosp for 3 days. Still have some nefazodone and nursing it along. Tried trazodone 50 HS for 2 nights and didn't sleep well and had a HA  Nefazodone and Depakote both helped the GERD.  She  has enough nefazadone for about 6 weeks. Plan: Nefazodone helped the reflux and she wants to take it as long as possible.  She didn't have withdrawal off nefazodone. When she runs out then start trazodone.  If that fails then use Viibryd.  Disc SE and alternatives.   Rec try trazodone again but use 100 mg and see if sleeping better prevents the AM HA.  If HA recurs then viibryd. Starter kit.  Depakote didn't help as much as expected and she might stop it.   02/18/2020 phone call: Yolanda Carroll called to report that she is experiencing high anxiety.  It is causing chest discomfort and breathing problems. Has appt 11/18 and is on the wait list but needs to discuss how to relief this anxiety.  Please call. Response: Note Rtc to patient, she does have the Viibryd and does remember that discussion. She has not been able to start that because she received a steroid injection a couple weeks ago and caused her stomach to be upset. She didn't want to start it knowing that's a side effect. She feels like the steroid injection may have worsened all her symptoms. She did try the trazodone but it was too much for her, even taking a 1/2 tablet. She does have some serzone left and has been taking that until this improves. Also taking Xanax as needed during day if needed. Hoping all of this will improve soon. Informed her I would update Yolanda Carroll with her information.        04/01/2020 appointment with the following noted: Out of nefazodone a few weeks ago.  More reflux off it.  Tried trazodone a couple of times.  Tried 100 mg trazodone and heart skipped.  50 mg awoke with chest tightness.  Long haul Covid with GI sx.  Nausea and anxiety problems daily since mid September.  Covid early August and got monoclonal infusion which helped.  Headed to long haul Covid clinic in [place] Monday.   Needed to increase alprazolam to BID.  And 80% better with that.  Panic some out of bed.  Sx started after steroid shot for her back 9/21.    Hasn't tried Viibryd DT fear of GI worsening.  Poor appetite and lost 15#. Not taken tramadol yet. Migraine under control. Plan: no med changes  06/01/20 appt with following noted: Starting generic Depakote ER generic today. Started Mirtazapine 3.75 mg and it worked right away for nausea for a week.  Then increased to 7.5 mg HS.  Most days is feeling really well.  No SE except mild drowsiness. Still has post Covid problems with stomach but much better.  Regained 7# of needed weight.  Need to try to gain 10# more. Zofran failed. Had a lot anxiety with N but it's better now.  Was waking with  panic and no longer N and anxiety better too. Severe scoliosis facing big surgery in summer with long recovery.  Can't ride in car for long. Plan: No med changes  08/18/2020 appointment with the following noted: Gained 12# and thankful for mirtazapine. Long Covid GI sx.  Better not gone with mirtazapine. Rare AM panic.  Sleep good. Scoliosis and may require surgery. Can't sit or stand for long Depakote brand ER 250 helps heartburn. Generic failed. Patient denies difficulty with sleep initiation with Xanax.. Denies appetite disturbance.  Patient reports that energy and motivation have been good.  Patient denies any difficulty with concentration.  Patient denies any suicidal ideation. More depressed and tearful with pain but varies with pain levels.  Good and bad days.  Not too depressed with anxiety worse. Plan: Continue mirtazapine 7.5 mg with Xanax for GI problems including nausea and appetite.  It has worked some.  Try higher dose. Insomnia managed with  With alprazolam.    11/18/2020 appointment with the following noted: Increased mirtazapine to 7.5 mg 1 and 1/2 tablet bc  less anxious with it. Facing big back surgery for severe scoliosis 11/30/20.  Can't drive in car for distance or travel. Now on Medicare. Depakote ER BRAND helped GI problems which were worse on generic. Not markedly depressed.   Patient reports stable mood and denies depressed or irritable moods.   Patient denies difficulty with sleep initiation or maintenance. Denies appetite disturbance.  Patient reports that energy and motivation have been good.  Patient denies any difficulty with concentration.  Patient denies any suicidal ideation. Plan: No med changes Continue Depakote brand ER 250 mg daily, continue alprazolam 0.25 mg nightly, continue mirtazapine 7.5 mg nightly  04/26/2021 appointment with the following noted: Still on hydrocodone from surgery on back in July.  Driving.  Healing slowly. Pain before surgery is gone now.  Now pain related to adjusting to rod. But surgery was successful. Fritz Pickerel has been helpful. Has reduced mirtazapine 3.75 mg HS to try to come off it.  Sleep a little worse if doesn't take it.  Stomach problems are better.   No HA in a year but started having migraines in a year.  Migraines started after change in logo of brand Depakote so restarted Aimovig.  Depakote also helped acid reflux. Mirtazapine helped sleep and appetite.  Weight is good.  Sleep is good with it still. Mood is OK. Active at church and people are supportive. Continue Depakote brand ER 250 mg daily, continue alprazolam 0.25 mg nightly, continue mirtazapine 7.5 mg nightly  08/03/2021 appointment the following noted: Not doing so good.  Was doing great after surgery. A few weeks ago the nausea and anxiety came back. Had reduced mirtazapine to 3.75 mg HS, back up to 7.5 mg HS about 2 weeks.  No effects so far. Mad and depressed and not me.  Nausea coming back has been upsetting. Pursuing GI work up. Awakens with anxiety.   No sleep trouble. Conc worse. Plan:Was Much better with with anxiety, appetite and sleep on mirtazapine 7.5 mg tablet 1 nightly and relapsed when decreased to 1/tablet nightly,  so increase to 1 &  1/2 of the 7.5 mg tablets HS .   09/27/2021 appointment with the following noted: Last few weeks  tough. Cholecystectomy 09/08/21 DT chronic nausea but not better.  Yesterday no nausea for first time in months. Losing weight on bland diet. But it stopped. Chronic back pain after surgery. SE mirtazapine dry mouth. Usually feels ok in AM  but afternoons gets nausea.   Xanax does help afternoon nausea so maybe it's anxiety. Exhausted from all of this.  Still blames some of it on covid. Not sleepy with Xanax.   Sad bc can't function normally.  Usually not depressed. Sleep is fine. Plan:Was Much better with with anxiety, appetite and sleep on mirtazapine 7.5 mg tablet 1 nightly initially.  Nausea, depression and anxiety Are back again. Option increase to 15 mg .  Yes.  10/26/2021 appointment with the following noted: It helped some.  Still some nausea.  CT scan next week.  Off and on nausea without pattern.  Not clearly anxiety but Xanax prn does help.  Frustrating that it is not predictable. Tolerated in crease in mirtazapine 15 with mild hangover.  Still some anxiety first thing in AM but not as bad as it was and is worse without Xanax.  No reason for the anxiety.   Not depressed but discouraged.  Tries to stay active and involved. Sleep is great always with Xanax. Chronic opiates DT back surgery for scoliosis Plan: Continue Xanax 0.25 mg BID for panic and GAD , but option increase TID bc helps nausea.  11/10/21 TC: Pt called reporting CT scan results fine. Requesting as discussed to change Mirtazepine to Olanzapine. MD: As discussed at the last office visit, if her GI work-up did not give a solution to her abdominal problems then it would be reasonable to try olanzapine at low dose.  I have sent in a prescription for 2.5 mg 1 nightly.  That is the lowest dose made.  If she uses a pill splitter she may try cutting it in half.  1/2 tablet may be sufficient for her.  She could experience benefit within the first day or 2 but give it a week or 2 as long as she is tolerating it to be sure. In order  to minimize the risk of daytime sleepiness take the olanzapine 2 to 3 hours before bedtime.  12/15/21 TC: Patient said she had increased the olanzapine and is still c/o headache and has had some dizziness today. She said she did "okay" on the mirtazapine. She said you had her on 1-1/2 tabs of 7.5. She said it probably isn't 100%, but felt she did okay otherwise.   01/11/22 appt noted: No solution to GI px.  Was told to get off hydrocodone but can't DT pain. Olanzapine tried with SE dizziness and HA after a month.  Without much benefit over mirtazapine. Overall ok.  Still some anxiety which doesn't want to eat but doing ok. Mirtazapine 7.5 mg HS helps. Sleep good.   Started Finger, good. About the same with mood  with some depression over heallth problems. Sees surgeon in October about pain problems. No panic but awakens with anxiety and general worry.   Less nausea.  02/27/22 TC: Does not appear that she is receiving any benefit from mirtazapine.  Mentioned in my notes that the obvious antidepressant choice would be duloxetine because it will sometimes help with pain as well as depression and anxiety. Stop mirtazapine and start duloxetine 20 mg capsule 1 daily for 1 week then 2 capsules daily. It will take 2 to 4 weeks to get any benefit from the duloxetine.  I would suggest in the interim that she try something that is stronger to help with nausea and anxiety but is likely to be insufficiently helpful for depression, therefore the need for duloxetine. So in addition to the duloxetine start olanzapine at  the lowest dose 2.5 mg tablet 1 nightly which could help with nausea appetite and anxiety almost right away.  If it causes too much sleepiness have her take it 2 to 3 hours before bedtime and she can even cut it in half if she has to. I will send in both prescriptions.  03/06/22 TC:  On 10/16 you wanted pt to take olanzapine and Cymbalta to replace mirtazapine.She did not start the  Cymbalta but did take olanzapine.She had side effects including insomnia,hand tremors,blurred vision and increased back pain.She decided to stop the med.She still wants to try Cymbalta but wants to know if it's ok to take with mirtazapine because she decided to start back taking it.     04/13/22 appointment noted:  Tried olanzapine twice with hand tremor and blurred vision. Back to mirtazapine 15 mg HS.   Less nausea.  Able to gain 6 # but no appetite. Bottomed out in October. Never tried Cymbalta.  Some concern over SE with it. Increased gabapentin to help pain with a little benefit. 300 BID and tolerating. In pain all day.  Tried Nucynta without help.   Mood is better than it was.  Working to accept chronic pain and limited activithy.  Saw neurologist for Migraine and had problems with the meds RX.  Aimovig has helped..    Past Psychiatric Medication Trials: Failed multiple other antidepressants,  Celexa chest pain Nortriptyline hyper Hydroxyzine SE  nefazodone & Depakote for migraine,  Depakote brand ER 250 helps heartburn. Generic failed. Mirtazapine 7.5 mg benefit Olanzapine 2.5 mg HS with SE dizziness and HA citalopram palpitations Trazodone Clonazepam did not help anxiety, alprazolam helps anxiety and sleep  Review of Systems:  Review of Systems  Constitutional:  Positive for appetite change.  Gastrointestinal:  Positive for abdominal pain and nausea.  Musculoskeletal:  Positive for arthralgias and back pain.  Neurological:  Negative for tremors and headaches.  Psychiatric/Behavioral:  Positive for dysphoric mood. Negative for agitation, behavioral problems, confusion, decreased concentration, hallucinations, self-injury, sleep disturbance and suicidal ideas. The patient is nervous/anxious. The patient is not hyperactive.     Medications: I have reviewed the patient's current medications.  Current Outpatient Medications  Medication Sig Dispense Refill   AIMOVIG 140  MG/ML SOAJ INJECT 140 MG INTO SKIN EVERY 30 DAYS 3 mL 3   ALPRAZolam (XANAX) 0.25 MG tablet TAKE 1 TABLET BY MOUTH 3 TIMES  DAILY AS NEEDED FOR ANXIETY (Patient taking differently: Take 0.25 mg by mouth 3 (three) times daily as needed for anxiety. BID PRN) 270 tablet 1   Ascorbic Acid (VITAMIN C PO) Take 500 mg by mouth daily.     Calcium Carbonate (CALCIUM 600 PO) Take 600-1,200 mg by mouth daily.     cholecalciferol (VITAMIN D3) 25 MCG (1000 UNIT) tablet Take 1,000 Units by mouth daily.     conjugated estrogens (PREMARIN) vaginal cream Place 1 Applicatorful vaginally 2 (two) times a week. 42.5 g 6   DEPAKOTE ER 250 MG 24 hr tablet TAKE 1 TABLET BY MOUTH DAILY 90 tablet 3   gabapentin (NEURONTIN) 300 MG capsule Take 300 mg by mouth every evening. BID     HYDROcodone-acetaminophen (NORCO) 7.5-325 MG tablet Take 1.5 tablets by mouth every 6 (six) hours as needed for moderate pain.     Lactobacillus (ACIDOPHILUS) 100 MG CAPS Take 100 mg by mouth daily.     lansoprazole (PREVACID) 30 MG capsule Take 30 mg by mouth 2 (two) times daily before a meal.  methocarbamol (ROBAXIN) 750 MG tablet Take 750 mg by mouth 3 (three) times daily.     mirtazapine (REMERON) 15 MG tablet Take 15 mg by mouth at bedtime. 1 tab HS     Multiple Vitamin (MULTIVITAMIN) tablet Take 1 tablet by mouth daily.     ondansetron (ZOFRAN) 4 MG tablet Take 1 tablet (4 mg total) by mouth daily as needed for nausea or vomiting. 30 tablet 1   polyethylene glycol (MIRALAX / GLYCOLAX) 17 g packet Take 17 g by mouth daily.     Probiotic Product (ALIGN) 4 MG CAPS Take 4 mg by mouth daily.     vitamin E 180 MG (400 UNITS) capsule Take 400 Units by mouth daily.     DULoxetine (CYMBALTA) 20 MG capsule 1 daily for 1 week then 2 capsules daily (Patient not taking: Reported on 04/13/2022) 60 capsule 1   OLANZapine (ZYPREXA) 2.5 MG tablet Take 1 tablet (2.5 mg total) by mouth at bedtime. (Patient not taking: Reported on 04/13/2022) 30 tablet 1    No current facility-administered medications for this visit.    Medication Side Effects: None  Allergies:  Allergies  Allergen Reactions   Penicillins Other (See Comments)    Seizures as a child   Atarax [Hydroxyzine] Other (See Comments)    Headache, depression   Bactrim [Sulfamethoxazole-Trimethoprim] Nausea Only   Celexa [Citalopram Hydrobromide] Other (See Comments)    Chest pain   Erythromycin      Upset stomach   Flagyl [Metronidazole]     Dizzy and increased heart rate   Nortriptyline Itching   Orphenadrine     Hand tremors   Prilosec [Omeprazole]     Abdominal pain   Requip [Ropinirole]     Made sx worse   Darvocet [Propoxyphene N-Acetaminophen] Itching   Percocet [Oxycodone-Acetaminophen] Itching    Past Medical History:  Diagnosis Date   Allergy    Anxiety    Arthritis    Cervical dysplasia    Endometriosis    Fatigue    GERD (gastroesophageal reflux disease)    History of hiatal hernia    pt states she currently has a hiatal hernia   IBS (irritable bowel syndrome)    Migraine    Osteopenia    Osteoporosis 01/2019   T score -3.1   Osteoporosis    Plantar fasciitis    PONV (postoperative nausea and vomiting)    also difficult to wake up   Recurrent vaginitis    Reflux    Scoliosis    Wears glasses     Family History  Problem Relation Age of Onset   Cancer Father        lymphoma   Other Mother        bipolar,reflux   Bipolar disorder Mother    Other Brother        sinus problems   Cancer Maternal Aunt        uterine cancer   Breast cancer Maternal Aunt        40's   Diabetes Maternal Aunt    Cancer Paternal Aunt        Colon cancer   Breast cancer Cousin 57       Mat. 1st cousin    Social History   Socioeconomic History   Marital status: Married    Spouse name: Fritz Pickerel   Number of children: 0   Years of education: 16   Highest education level: Not on file  Occupational History    Comment:  [employer]   Tobacco Use   Smoking status: Former    Types: Cigarettes    Quit date: 05/17/1983    Years since quitting: 38.9   Smokeless tobacco: Never  Vaping Use   Vaping Use: Never used  Substance and Sexual Activity   Alcohol use: Not Currently   Drug use: No   Sexual activity: Not Currently    Birth control/protection: Post-menopausal    Comment: intercourse age 4 , sexual partners more than 5  Other Topics Concern   Not on file  Social History Narrative   Pt is married, no children.  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    Caffeine- very little.   Right handed   Lives at home with her husband   Social Determinants of Health   Financial Resource Strain: Not on file  Food Insecurity: Not on file  Transportation Needs: Not on file  Physical Activity: Not on file  Stress: Not on file  Social Connections: Not on file  Intimate Partner Violence: Not on file    Past Medical History, Surgical history, Social history, and Family history were reviewed and updated as appropriate.   Please see review of systems for further details on the patient's review from today.   Objective:   Physical Exam:  There were no vitals taken for this visit.  Physical Exam Constitutional:      General: She is not in acute distress. Musculoskeletal:        General: No deformity.  Neurological:     Mental Status: She is alert and oriented to person, place, and time.     Cranial Nerves: No dysarthria.     Coordination: Coordination normal.  Psychiatric:        Attention and Perception: Attention and perception normal. She does not perceive auditory or visual hallucinations.        Mood and Affect: Mood is anxious and depressed. Affect is not labile, blunt, tearful or inappropriate.        Speech: Speech normal.        Behavior: Behavior normal. Behavior is cooperative.        Thought Content: Thought content normal. Thought content is not paranoid or delusional. Thought content  does not include homicidal or suicidal ideation. Thought content does not include suicidal plan.        Cognition and Memory: Cognition and memory normal.        Judgment: Judgment normal.     Comments: Insight intact Some residual anxiety chronically       Lab Review:     Component Value Date/Time   NA 140 08/31/2021 1100   NA 134 03/26/2020 1056   K 4.4 08/31/2021 1100   CL 103 08/31/2021 1100   CO2 31 08/31/2021 1100   GLUCOSE 98 08/31/2021 1100   BUN 7 (L) 08/31/2021 1100   BUN 8 03/26/2020 1056   CREATININE 0.73 08/31/2021 1100   CALCIUM 9.4 08/31/2021 1100   PROT 6.9 08/31/2021 1100   ALBUMIN 4.0 08/31/2021 1100   AST 18 08/31/2021 1100   ALT 13 08/31/2021 1100   ALKPHOS 36 (L) 08/31/2021 1100   BILITOT 0.6 08/31/2021 1100   GFRNONAA >60 08/31/2021 1100   GFRAA 91 03/26/2020 1056       Component Value Date/Time   WBC 4.2 08/31/2021 1100   RBC 4.18 08/31/2021 1100   HGB 12.8 08/31/2021 1100   HCT 40.1 08/31/2021 1100   PLT 205 08/31/2021 1100  MCV 95.9 08/31/2021 1100   MCH 30.6 08/31/2021 1100   MCHC 31.9 08/31/2021 1100   RDW 12.4 08/31/2021 1100   LYMPHSABS 0.7 12/21/2019 0949   MONOABS 0.6 12/21/2019 0949   EOSABS 0.0 12/21/2019 0949   BASOSABS 0.0 12/21/2019 0949    Normal liver enzymes in September.  No results found for: "POCLITH", "LITHIUM"   No results found for: "PHENYTOIN", "PHENOBARB", "VALPROATE", "CBMZ"   .res Assessment: Plan:    Vianca was seen today for follow-up, depression and anxiety.  Diagnoses and all orders for this visit:  Major depressive disorder, recurrent episode, moderate (HCC)  Generalized anxiety disorder  Panic disorder without agoraphobia  Nausea without vomiting  Migraine without aura and without status migrainosus, not intractable  Insomnia due to mental condition   Greater than 50% of 40 min non face to face time with patient was spent on counseling and coordination of care. We discussed the  following. She's worse again with more physical problems  this is affecting mood.  Was Much better with with anxiety, appetite and sleep on mirtazapine 7.5 mg tablet 1 nightly initially.  Nausea, depression and anxiety Are back again.  Continue mirtazapine 15 mg .  Yes or she can stop it if desired.  Rec trial Cymbalta for chronic pain and some depression.  Disc her fears of serotonin syndrome 20 mg then 40 mg daily   Cont Depakote for reflux.  Depakote ER BRAND helped GI problems which were worse on generic.  But she wants to retry stopping it.  Supportive therapy dealing with health problems.  Back and stomach pain and problems. Frustrated.    Answered questions about CBD  We discussed the short-term risks associated with benzodiazepines including sedation and increased fall risk among others.  Discussed long-term side effect risk including dependence, potential withdrawal symptoms, and the potential eventual d  ose-related risk of dementia. Disc newer studies that cast in doubt the relationship with dementia.  Not drowsy with alprazolam. Disc warnings about combo of Bz and opiates not relevant at this low dose. BC morning anxiety could try switching to Valium and she doesn't want to try it. Continue Xanax 0.25 mg BID for panic and GAD , but option increase TID bc helps nausea.  Consider Genesight testing bc med sensitivity and failures.  FU 2 mos  Lynder Parents, MD, DFAPA  Please see After Visit Summary for patient specific instructions.  Future Appointments  Date Time Provider [place]  04/19/2022 11:00 AM Shanon Ace, LCSW CP-CP None  05/01/2022  1:30 PM Daryel November, MD LBGI-GI [facility]PC[facility]astro  05/02/2022 12:20 PM GI-BCG MM 2 GI-BCGMM GI-BREAST CE  05/03/2022 11:00 AM Shanon Ace, LCSW CP-CP None  05/17/2022 10:00 AM Shanon Ace, LCSW CP-CP None  06/09/2022 10:00 AM LBPC-LBENDO NURSE LBPC-LBENDO None  10/05/2022  2:00 PM Princess Bruins, MD GCG-GCG None   10/24/2022 11:00 AM Debbora Presto, NP GNA-GNA None  04/02/2023 10:20 AM Philemon Kingdom, MD LBPC-LBENDO None    No orders of the defined types were placed in this encounter.     -------------------------------

## 2022-04-19 ENCOUNTER — Telehealth: Payer: Self-pay | Admitting: Psychiatry

## 2022-04-19 ENCOUNTER — Ambulatory Visit (INDEPENDENT_AMBULATORY_CARE_PROVIDER_SITE_OTHER): Payer: Medicare Other | Admitting: Psychiatry

## 2022-04-19 DIAGNOSIS — F411 Generalized anxiety disorder: Secondary | ICD-10-CM | POA: Diagnosis not present

## 2022-04-19 NOTE — Telephone Encounter (Signed)
Pt stated she started Cymbalta  on 11/30 and still on 1 daily.She is anxious/jittery,and still having some nausea and lack of appetite please advise

## 2022-04-19 NOTE — Progress Notes (Signed)
[facility] Counselor/Therapist Progress Note  Patient ID: Yolanda Carroll, MRN: [id number],    Date: 04/19/2022  Time Spent: 55 minutes   Treatment Type: Individual Therapy  Reported Symptoms: anxiety, depression, "horrible anxiety" this morning and feels Cymbalta is making her more anxious, having to rest in bed to get relief from pain  Mental Status Exam:  Appearance:   Casual     Behavior:  Appropriate, Sharing, and some agitation , "just very frustrated with all this"  Motor:  Back pain persists  Speech/Language:   Clear and Coherent  Affect:  Depressed and anxious, irritable  Mood:  anxious, depressed, and irritable  Thought process:  goal directed  Thought content:    Rumination  Sensory/Perceptual disturbances:    WNL  Orientation:  oriented to person, place, time/date, situation, day of week, month of year, year, and stated date of Dec. 6, 2023  Attention:  Good  Concentration:  Good  Memory:  WNL  Fund of knowledge:   Good  Insight:    Good  Judgment:   Good  Impulse Control:  Fair per pt report   Risk Assessment: Danger to Self:  No Self-injurious Behavior: No Danger to Others: No Duty to Warn:no Physical Aggression / Violence:No  Access to Firearms a concern: No  Gang Involvement:No   Subjective: Patient today reporting depression and anxiety and her continued back pain. To see surgeon Jan. 8, 2024, and pain management Dr today. Holidays can be more difficult due to family losses at Thanksgiving or Christmas. Was able to get out some recently to shop some but limited time. Saw brother at Thanksgiving who drove up from [place]. Husband also having some physical pain which is frustrating. Got her flu shot. Patient today frustrated and stated she needed session to process "all that I'm feeling and dealing with physically" which is so frustrating. Trying to keep some exercise/walking going "to help my balance." "Does help to talk these things through and  receive support". Looking forward to seeing her new GI doctor [name]) next weeks.  Trying to keep "my brain busy so as to not focus as much on my anxiety and frustration.  Interventions: Cognitive Behavioral Therapy, Solution-Oriented/Positive Psychology, and Ego-Supportive  Long-term goal:  Elevate mood and show evidence of usual energy, activities, and socialization level. Short-term goal: Verbally identify the source of depressed mood and work to decrease its impact. Strategies: Identify cognitive self-talk that tends to support depression.  Verbalize hopeful and positive statements regarding her has not and future  Diagnosis:   ICD-10-CM   1. Generalized anxiety disorder  F41.1      Plan:  Patient overall today reports not feeling much better emotionally nor physically. Frustrated, depressed, anxious. Denies any SI.  As noted above, patient able to more fully share her struggles physically with ongoing back pain which also affects her difficulty emotionally, especially when the pain is so ongoing over a period of time.  Looked at what helps somewhat does not seem to help, Best ways of her trying to manage her pain and having some time lying down which seems to be her best position but also tolerating time when she needs to be up during the day as she understandably does not want to just be laying around all the time.  This definitely adds to her emotional stress.  Supportive friends is a big help to her but has moments off and on when she is just very frustrated but does continue to try  strategies to help with her physical pain as well as emotional pain.  States that gratitude listing helps at times.  Trying to look more for the uplifting times during the holidays rather than sadness is from the past. Encouraged patient in her practice of more positive behaviors as noted in session including: Remaining in contact with friends and neighbors that are supportive/helpful, setting healthy  boundaries with others, remain in the present focusing on what she can control her change, trying to get outside some each day, remain on prescribed medication, healthy nutrition and exercise as she is able, trying not to assume the negatives and worst-case scenarios, consider using some deep breathing exercises as noted in session, remain in contact with her church which is a very supportive network for her, refrain from [place], allow her faith to be an emotional support as well as spiritual, and recognize the strengths she shows when working with goal-directed behaviors to move in a direction that supports her improved emotional health.   Goal review and progress/challenges noted with patient.  Next appointment 2 weeks.  This record has been created using [employer].  Chart creation errors have been sought, but may not always have been located and corrected.  Such creation errors do not reflect on the standard of medical care provided.   Shanon Ace, LCSW

## 2022-04-19 NOTE — Telephone Encounter (Signed)
She is taking the lowest dose of dulxetine and not tolerating it so stop it. I don't have antoher option that will help pain.   If she thinks she is getting some benefit with mirtazapine then continue it.  If not stop it too I would also suggest she come get the [facility] genetic test I discussed with her.

## 2022-04-19 NOTE — Telephone Encounter (Signed)
Pt said that the cymbalta is making her jittery and more anxious. She has cut the mirtazapine to 7.'5mg'$  not sure if working . Wants something different . Please call the pt at [phone]

## 2022-04-20 NOTE — Telephone Encounter (Signed)
Pt stated you wanted her to wait until the new gene site test was available.Did we receive a new testing kit?

## 2022-04-21 ENCOUNTER — Other Ambulatory Visit: Payer: Self-pay | Admitting: Psychiatry

## 2022-04-21 DIAGNOSIS — G43009 Migraine without aura, not intractable, without status migrainosus: Secondary | ICD-10-CM

## 2022-04-21 DIAGNOSIS — F331 Major depressive disorder, recurrent, moderate: Secondary | ICD-10-CM

## 2022-04-21 DIAGNOSIS — F411 Generalized anxiety disorder: Secondary | ICD-10-CM

## 2022-04-21 DIAGNOSIS — F410 Panic disorder [episodic paroxysmal anxiety] without agoraphobia: Secondary | ICD-10-CM

## 2022-04-21 NOTE — Telephone Encounter (Signed)
I think I figured out how to order it .  I did so.  She should receive an email or my chart message about the new genetic test.

## 2022-05-01 ENCOUNTER — Encounter: Payer: Self-pay | Admitting: Gastroenterology

## 2022-05-01 ENCOUNTER — Ambulatory Visit (INDEPENDENT_AMBULATORY_CARE_PROVIDER_SITE_OTHER): Payer: Medicare Other | Admitting: Gastroenterology

## 2022-05-01 ENCOUNTER — Ambulatory Visit: Payer: Medicare Other | Admitting: Psychiatry

## 2022-05-01 VITALS — BP 90/70 | HR 73 | Ht 68.0 in | Wt 124.4 lb

## 2022-05-01 DIAGNOSIS — M810 Age-related osteoporosis without current pathological fracture: Secondary | ICD-10-CM | POA: Diagnosis not present

## 2022-05-01 DIAGNOSIS — K219 Gastro-esophageal reflux disease without esophagitis: Secondary | ICD-10-CM

## 2022-05-01 DIAGNOSIS — K5900 Constipation, unspecified: Secondary | ICD-10-CM | POA: Diagnosis not present

## 2022-05-01 DIAGNOSIS — R110 Nausea: Secondary | ICD-10-CM

## 2022-05-01 MED ORDER — FAMOTIDINE 40 MG PO TABS: 40.0000 mg | ORAL_TABLET | Freq: Every day | ORAL | 3 refills | Status: DC

## 2022-05-01 MED ORDER — ONDANSETRON HCL 4 MG PO TABS
4.0000 mg | ORAL_TABLET | Freq: Every day | ORAL | 3 refills | Status: AC | PRN
Start: 2022-05-01 — End: 2023-05-01

## 2022-05-01 NOTE — Patient Instructions (Addendum)
_______________________________________________________  If you are age 66 or older, your body mass index should be between 23-30. Your Body mass index is 18.91 kg/m. If this is out of the aforementioned range listed, please consider follow up with your Primary Care Provider.  If you are age 67 or younger, your body mass index should be between 19-25. Your Body mass index is 18.91 kg/m. If this is out of the aformentioned range listed, please consider follow up with your Primary Care Provider.   We have sent the following medications to your pharmacy for you to pick up at your convenience: Start Pepcid 40 mg twice daily, Zofran 4 mg every 6 hours as needed.  Wean off of Lansoprazole .Take every other day for two weeks then stop.    The [facility] providers would like to encourage you to use [facility] to communicate with providers for non-urgent requests or questions.  Due to long hold times on the telephone, sending your provider a message by [facility] may be a faster and more efficient way to get a response.  Please allow 48 business hours for a response.  Please remember that this is for non-urgent requests.   It was a pleasure to see you today!  Thank you for trusting me with your gastrointestinal care!    Scott E.[name], MD

## 2022-05-01 NOTE — Progress Notes (Unsigned)
HPI : Yolanda Carroll is a very pleasant 66 year old female with a history of anxiety, depression, chronic pain with opioid dependence and IBS who is referred to [place] by Dr. Alessandra Grout for ongoing management of chronic GI symptoms.  Patient was previously followed in [facility] by Dr. Cristina Gong and then [facility].  Her last visit with Dr. Watt Climes was October 4, with chief complaints of constipation, diarrhea and nausea.  At that visit, she reported taking MiraLAX and stool softeners for constipation and Pepto for her diarrhea.  She was recommended to increase MiraLAX up to 3 times a day and use Pepto only when going out of the house.  Previous evaluation thus far includes an upper endoscopy in November 2021 which was normal, with biopsies negative for celiac and H. pylori. Her last colonoscopy was in March 2015 with diverticulosis.  Recommended to repeat in 10 years.  She had a CT scan in June of this year which was unremarkable.  She had a gastric emptying study in 2022 which was normal.  She underwent cholecystectomy in April of this year due to the presence of sludge in her gallbladder.  However, her symptoms did not improve following the cholecystectomy.  Taking hydrocodone for 1-2 years Nausea improving Now having  Takes Miralax most days; takes it most days;  Bms most days or every other day. Weight is improving Takes lansoprazole for GERD for a few years.  Takes it once day.  Taking PPI for year  Wakes up with acid regurgitation.  Hoarseness. Eating 2200 calories per day.  Gaining some weight Lactose intolerant.  Abdominal tenderness since cholecystectomy No pain, no vomiting. No desire to eat.  Denies early satiety or postprandial discomfort  Had back surgery (scoliosis correction) in July 2022; 6 hr surgery,  Hospitalized for a week. Ultram before surgery Stays at a 8/10 for pain despite hydrocodone   Started mirtazepine last year; has helped some Was taking Depakote for migraines,  switched to [place], but her reflux worsened as soon as she stopped taking; still taking depakote more for reflux symptoms.  Takes famotidine sometimes. Not taking Zyprexa or Cymbalta Is taking Gabapentin COVID 2021: primarily GI symptoms      Flexible sigmoidoscopy (Dr. Cristina Gong) March 25, 2020 Indication: Rectal hemorrhage Normal colon, nonbleeding internal hemorrhoids, presumed to be source of patient's recent bleeding  EGD Sep 16, 2019 (Dr. Cristina Gong) Indication: Esophageal reflux, weight loss Normal esophagus, normal stomach, normal duodenum Biopsies: Chronic inactive gastritis, normal duodenal biopsies, normal esophageal biopsies  EGD March 25, 2020 (Dr. Cristina Gong) Indication: Nausea Normal esophagus, normal stomach, normal duodenum Normal biopsies  Colonoscopy July 22, 2013 (Dr. Cristina Gong) Sigmoid diverticulosis, 4 small polyps in the ascending colon, removed with forceps Pathology consistent with lymphoid aggregates Recommended repeat colonoscopy in 10 years Past Medical History:  Diagnosis Date   Allergy    Anxiety    Arthritis    Cervical dysplasia    Endometriosis    Fatigue    GERD (gastroesophageal reflux disease)    History of hiatal hernia    pt states she currently has a hiatal hernia   IBS (irritable bowel syndrome)    Migraine    Osteopenia    Osteoporosis 01/2019   T score -3.1   Osteoporosis    Plantar fasciitis    PONV (postoperative nausea and vomiting)    also difficult to wake up   Recurrent vaginitis    Reflux    Scoliosis    Wears glasses  Past Surgical History:  Procedure Laterality Date   ANTERIOR CERVICAL DECOMP/DISCECTOMY FUSION  04/17/2011   Procedure: ANTERIOR CERVICAL DECOMPRESSION/DISCECTOMY FUSION 2 LEVELS;  Surgeon: Hosie Spangle;  Location: [facility];  Service: Neurosurgery;  Laterality: N/A;  Cervical five-six, six-seven anterior cervical decompression with fusion,  plating,  and bonegraft     APPENDECTOMY  1978   BACK SURGERY N/A    BREAST BIOPSY  07/13/2011   Procedure: BREAST BIOPSY WITH NEEDLE LOCALIZATION;  Surgeon: Rolm Bookbinder, MD;  Location: [facility];  Service: General;  Laterality: Right;  Right breast wire localization biopsy   BREAST EXCISIONAL BIOPSY Right 2013   BREAST SURGERY     Breast Bx-Benign   CHOLECYSTECTOMY N/A 09/08/2021   Procedure: LAPAROSCOPIC CHOLECYSTECTOMY;  Surgeon: Erroll Luna, MD;  Location: [facility];  Service: General;  Laterality: N/A;   [place]  08/02/1995   RIH   LAPAROSCOPIC ENDOMETRIOSIS FULGURATION  1997   PELVIC LAPAROSCOPY     ROTATOR CUFF REPAIR     right 2002 left 2000   Family History  Problem Relation Age of Onset   Cancer Father        lymphoma   Other Mother        bipolar,reflux   Bipolar disorder Mother    Other Brother        sinus problems   Cancer Maternal Aunt        uterine cancer   Breast cancer Maternal Aunt        40's   Diabetes Maternal Aunt    Cancer Paternal Aunt        Colon cancer   Breast cancer Cousin 75       Mat. 1st cousin   Social History   Tobacco Use   Smoking status: Former    Types: Cigarettes    Quit date: 05/17/1983    Years since quitting: 38.9   Smokeless tobacco: Never  Vaping Use   Vaping Use: Never used  Substance Use Topics   Alcohol use: Not Currently   Drug use: No   Current Outpatient Medications  Medication Sig Dispense Refill   AIMOVIG 140 MG/ML SOAJ INJECT 140 MG INTO SKIN EVERY 30 DAYS 3 mL 3   ALPRAZolam (XANAX) 0.25 MG tablet TAKE 1 TABLET BY MOUTH 3 TIMES  DAILY AS NEEDED FOR ANXIETY (Patient taking differently: Take 0.25 mg by mouth 3 (three) times daily as needed for anxiety. BID PRN) 270 tablet 1   Ascorbic Acid (VITAMIN C PO) Take 500 mg by mouth daily.     Calcium Carbonate (CALCIUM 600 PO) Take 600-1,200 mg by mouth daily.     cholecalciferol (VITAMIN D3) 25 MCG (1000 UNIT) tablet  Take 1,000 Units by mouth daily.     conjugated estrogens (PREMARIN) vaginal cream Place 1 Applicatorful vaginally 2 (two) times a week. 42.5 g 6   DEPAKOTE ER 250 MG 24 hr tablet TAKE 1 TABLET BY MOUTH DAILY 90 tablet 3   gabapentin (NEURONTIN) 300 MG capsule Take 300 mg by mouth every evening. BID     HYDROcodone-acetaminophen (NORCO) 7.5-325 MG tablet Take 1.5 tablets by mouth every 6 (six) hours as needed for moderate pain.     Lactobacillus (ACIDOPHILUS) 100 MG CAPS Take 100 mg by mouth daily.     lansoprazole (PREVACID) 30 MG capsule Take 30 mg by mouth 2 (two) times daily before a meal.  methocarbamol (ROBAXIN) 750 MG tablet Take 750 mg by mouth 3 (three) times daily.     mirtazapine (REMERON) 15 MG tablet Take 15 mg by mouth at bedtime. 1 tab HS     Multiple Vitamin (MULTIVITAMIN) tablet Take 1 tablet by mouth daily.     ondansetron (ZOFRAN) 4 MG tablet Take 1 tablet (4 mg total) by mouth daily as needed for nausea or vomiting. 30 tablet 1   polyethylene glycol (MIRALAX / GLYCOLAX) 17 g packet Take 17 g by mouth daily.     Probiotic Product (ALIGN) 4 MG CAPS Take 4 mg by mouth daily.     DULoxetine (CYMBALTA) 20 MG capsule 1 daily for 1 week then 2 capsules daily (Patient not taking: Reported on 04/13/2022) 60 capsule 1   OLANZapine (ZYPREXA) 2.5 MG tablet Take 1 tablet (2.5 mg total) by mouth at bedtime. (Patient not taking: Reported on 04/13/2022) 30 tablet 1   vitamin E 180 MG (400 UNITS) capsule Take 400 Units by mouth daily.     No current facility-administered medications for this visit.   Allergies  Allergen Reactions   Penicillins Other (See Comments)    Seizures as a child   Atarax [Hydroxyzine] Other (See Comments)    Headache, depression   Bactrim [Sulfamethoxazole-Trimethoprim] Nausea Only   Celexa [Citalopram Hydrobromide] Other (See Comments)    Chest pain   Erythromycin      Upset stomach   Flagyl [Metronidazole]     Dizzy and increased heart rate    Nortriptyline Itching   Orphenadrine     Hand tremors   Prilosec [Omeprazole]     Abdominal pain   Requip [Ropinirole]     Made sx worse   Darvocet [Propoxyphene N-Acetaminophen] Itching   Percocet [Oxycodone-Acetaminophen] Itching     Review of Systems: All systems reviewed and negative except where noted in HPI.    No results found.  Physical Exam: Ht '5\' 8"'$  (1.727 m)   Wt 124 lb 6 oz (56.4 kg)   BMI 18.91 kg/m  Constitutional: Pleasant,well-developed, ***female in no acute distress. HEENT: Normocephalic and atraumatic. Conjunctivae are normal. No scleral icterus. Neck supple.  Cardiovascular: Normal rate, regular rhythm.  Pulmonary/chest: Effort normal and breath sounds normal. No wheezing, rales or rhonchi. Abdominal: Soft, nondistended, nontender. Bowel sounds active throughout. There are no masses palpable. No hepatomegaly. Extremities: no edema Lymphadenopathy: No cervical adenopathy noted. Neurological: Alert and oriented to person place and time. Skin: Skin is warm and dry. No rashes noted. Psychiatric: Normal mood and affect. Behavior is normal.  CBC    Component Value Date/Time   WBC 4.2 08/31/2021 1100   RBC 4.18 08/31/2021 1100   HGB 12.8 08/31/2021 1100   HCT 40.1 08/31/2021 1100   PLT 205 08/31/2021 1100   MCV 95.9 08/31/2021 1100   MCH 30.6 08/31/2021 1100   MCHC 31.9 08/31/2021 1100   RDW 12.4 08/31/2021 1100   LYMPHSABS 0.7 12/21/2019 0949   MONOABS 0.6 12/21/2019 0949   EOSABS 0.0 12/21/2019 0949   BASOSABS 0.0 12/21/2019 0949    CMP     Component Value Date/Time   NA 140 08/31/2021 1100   NA 134 03/26/2020 1056   K 4.4 08/31/2021 1100   CL 103 08/31/2021 1100   CO2 31 08/31/2021 1100   GLUCOSE 98 08/31/2021 1100   BUN 7 (L) 08/31/2021 1100   BUN 8 03/26/2020 1056   CREATININE 0.73 08/31/2021 1100   CALCIUM 9.4 08/31/2021 1100   PROT 6.9 08/31/2021  1100   ALBUMIN 4.0 08/31/2021 1100   AST 18 08/31/2021 1100   ALT 13 08/31/2021  1100   ALKPHOS 36 (L) 08/31/2021 1100   BILITOT 0.6 08/31/2021 1100   GFRNONAA >60 08/31/2021 1100   GFRAA 91 03/26/2020 1056     ASSESSMENT AND PLAN:   Wean off Lansoprazole  Start Pepcid 40 mg PO BID Zofran refill Continue Miralax Ok stop probiotics  Jonathon Jordan, MD

## 2022-05-02 ENCOUNTER — Ambulatory Visit
Admission: RE | Admit: 2022-05-02 | Discharge: 2022-05-02 | Disposition: A | Discharge status: Home / Self Care | Payer: Medicare Other | Source: Ambulatory Visit | Attending: Family Medicine | Admitting: Family Medicine

## 2022-05-02 DIAGNOSIS — Z1231 Encounter for screening mammogram for malignant neoplasm of breast: Secondary | ICD-10-CM

## 2022-05-03 ENCOUNTER — Ambulatory Visit (INDEPENDENT_AMBULATORY_CARE_PROVIDER_SITE_OTHER): Payer: Medicare Other | Admitting: Psychiatry

## 2022-05-03 DIAGNOSIS — F411 Generalized anxiety disorder: Secondary | ICD-10-CM

## 2022-05-03 NOTE — Progress Notes (Signed)
[facility] Counselor/Therapist Progress Note  Patient ID: Yolanda Carroll, MRN: [id number],    Date: 05/03/2022  Time Spent: 55 minutes  Treatment Type: Individual Therapy  Reported Symptoms:  anxiety, depression mostly related to health issues  Mental Status Exam:  Appearance:   Casual     Behavior:  Appropriate, Sharing, and Motivated  Motor:  Normal  Speech/Language:   Clear and Coherent  Affect:  Depressed and anxious  Mood:  anxious and depressed  Thought process:  goal directed  Thought content:    Rumination  Sensory/Perceptual disturbances:    WNL  Orientation:  oriented to person, place, time/date, situation, day of week, month of year, year, and stated date of Dec. 20, 2023  Attention:  Good  Concentration:  Good  Memory:  WNL  Fund of knowledge:   Good  Insight:    Good  Judgment:   Good  Impulse Control:  Good   Risk Assessment: Danger to Self:  No Self-injurious Behavior: No Danger to Others: No Duty to Warn:no Physical Aggression / Violence:No  Access to Firearms a concern: No  Gang Involvement:No   Subjective: Patient today reporting anxiety and depression that are related mostly to her physical issues and dealing with ongoing pain. Has been able to attend part of 3 Christmas parties as was able to be more mobile than she thought. Continues to "keep my brain busy" that helps her deal with her pain and frustrations. Saw new GI doctor [name]) and that went well. Finds meditation prayers website helpful. Saw pain management Dr recently and to consider a treatment involving small implanted device to help control pain. Also planning to  have her genetic testing done to help determine and target meds that may be more helpful to patient. Some challenges with holiday time and difficult family losses during holiday time. Husband having meniscus tear issue but postponing any surgery. Trying to improve her balance with a little more walking and moving  about.  Interventions: Cognitive Behavioral Therapy, Solution-Oriented/Positive Psychology, and Ego-Supportive  Long-term goal:  Elevate mood and show evidence of usual energy, activities, and socialization level. Short-term goal: Verbally identify the source of depressed mood and work to decrease its impact. Strategies: Identify cognitive self-talk that tends to support depression.  Verbalize hopeful and positive statements regarding her has not and future  Diagnosis:   ICD-10-CM   1. Generalized anxiety disorder  F41.1      Plan: Patient today noticing some small bits of improvement and trying to focus more on these improvements versus the obstacles she has been facing.  To see her surgeon again soon as well as pain management doctor as there is a newer procedure that she might try involving a type of implanted device that can help with pain for her back.  Seems to be feeling more optimistic about this possibility and continuing the various activities that she finds helpful including meditative prayers, contact with supportive friends, getting outside as she is able, walking, and actively participating in therapy to talk through her concerns as well as some hopefulness most recently.  Some decrease in frustration.  Impression is not quite as strong today.  Anxiety a little bit less and is trying to be more hopeful heading into the holidays where she does expect to be around people more but also being careful to avoid crowds as she has concerns about the flu and COVID. Encouraged patient and practicing more positive behaviors as noted in session including: Remaining in  contact with friends and neighbors that are supportive and helpful, maintain healthy boundaries with others, stay in the present focusing on what she can control or change, get outside some each day, stay on her prescribed medication, healthy nutrition and exercise as she is able, trying not to assume the negatives and worst-case  scenarios, consider using deep breathing exercises as noted in session, stay in contact with her church which is very supportive of her, refrain from [place], allow her faith to be an emotional support as well as spiritual, and realize the strength she shows working with goal-directed behaviors to move in a direction that supports her improved emotional health and outlook.  Goal review and progress/challenges noted with patient.  Next appointment within 2 to 3 weeks.  This record has been created using [employer].  Chart creation errors have been sought, but may not always have been located and corrected.  Such creation errors do not reflect on the standard of medical care provided.   Shanon Ace, LCSW

## 2022-05-03 NOTE — Telephone Encounter (Addendum)
Faxed completed application to [employer] at [phone]. Received fax confirmation.  Submitted PA [employer] on covermymeds. Key: [id number]. Waiting on determination from [employer] Medicare Part D. PA approved: "Request Reference Number: [id number]. AIMOVIG INJ '140MG'$ /ML is approved through 05/15/2023. Your patient may now fill this prescription and it will be covered."

## 2022-05-04 ENCOUNTER — Telehealth: Payer: Self-pay | Admitting: Psychiatry

## 2022-05-04 NOTE — Telephone Encounter (Signed)
Next visit is 06/20/22. Yolanda Carroll called requesting a refill on her Alprazolam .25 mg called to:  [place], [place]   Phone: [phone]  Fax: [phone]    Requesting a new prescription for 90 days.

## 2022-05-04 NOTE — Telephone Encounter (Signed)
Filled 10/11 for 90 days due 05/23/22

## 2022-05-09 NOTE — Telephone Encounter (Signed)
Prolia VOB initiated via [email or web address]  Last Prolia inj 12/06/21 Next Prolia inj due 06/09/22   Scheduled 06/09/22

## 2022-05-10 NOTE — Telephone Encounter (Signed)
Received fax from [employer] that pt approved until 05/15/23. Case ID: [id number]

## 2022-05-16 ENCOUNTER — Other Ambulatory Visit: Payer: Self-pay

## 2022-05-16 DIAGNOSIS — F410 Panic disorder [episodic paroxysmal anxiety] without agoraphobia: Secondary | ICD-10-CM

## 2022-05-16 MED ORDER — ALPRAZOLAM 0.25 MG PO TABS: 0.2500 mg | ORAL_TABLET | Freq: Three times a day (TID) | ORAL | 1 refills | Status: DC | PRN

## 2022-05-16 NOTE — Telephone Encounter (Signed)
Pended.

## 2022-05-17 ENCOUNTER — Ambulatory Visit: Payer: Medicare Other | Admitting: Psychiatry

## 2022-05-23 NOTE — Telephone Encounter (Signed)
Pt ready for scheduling on or after 06/09/22 (scheduled)  Out-of-pocket cost due at time of visit: $240 (Medicare deductible)  Primary: Medicare Prolia co-insurance: 20% (approximately $276) Admin fee co-insurance: 20% (approximately $25)  Deductible: $0 of $240 met  Secondary: [employer] Med [employer] Plan G Prolia co-insurance: Covers Medicare Part B co-insurance Admin fee co-insurance: Covers Medicare Part B co-insurance  Deductible: NOT covered by secondary  Prior Auth: NOT required PA# Valid:   ** This summary of benefits is an estimation of the patient's out-of-pocket cost. Exact cost may vary based on individual plan coverage.

## 2022-05-29 ENCOUNTER — Ambulatory Visit (INDEPENDENT_AMBULATORY_CARE_PROVIDER_SITE_OTHER): Payer: Medicare Other | Admitting: Psychiatry

## 2022-05-29 ENCOUNTER — Telehealth: Payer: Self-pay | Admitting: Psychiatry

## 2022-05-29 DIAGNOSIS — F411 Generalized anxiety disorder: Secondary | ICD-10-CM

## 2022-05-29 NOTE — Progress Notes (Signed)
[facility] Counselor/Therapist Progress Note  Patient ID: Yolanda Carroll, MRN: [id number],    Date: 05/29/2022  Time Spent: 55 minutes  Treatment Type: Individual Therapy  Reported Symptoms: anxiety, some depression  Mental Status Exam:  Appearance:   Casual and Neat     Behavior:  Appropriate, Sharing, and Motivated  Motor:  Normal  Speech/Language:   Clear and Coherent  Affect:  anxious  Mood:  anxious and some depression (but better)  Thought process:  goal directed  Thought content:    WNL  Sensory/Perceptual disturbances:    WNL  Orientation:  oriented to person, place, time/date, situation, day of week, month of year, year, and stated date of Jan. 15, 2024  Attention:  Good  Concentration:  Good and Fair  Memory:  WNL  Fund of knowledge:   Good  Insight:    Good  Judgment:   Good  Impulse Control:  Good   Risk Assessment: Danger to Self:  No Self-injurious Behavior: No Danger to Others: No Duty to Warn:no Physical Aggression / Violence:No  Access to Firearms a concern: No  Gang Involvement:No   Subjective: Patient in today reporting anxiety as main symptom with some depression (although better).  Has just recently seen her surgeon, med provider here at [facility], and pain management Dr. Re: her ongoing back pain. They are planning to try peripheral nerve stimulator and see if that will benefit her and is to have that done. Husband having some eye and arm issues and having them checked out. "The holidays were hard" especially with her physical pain". Good support from others within church, neighborhood, and beyond. Enjoys being in a "circle group" at her church monthly. Thinking of what I can do to get through the winter as I hate winter. Staying warmly clothed especially when she goes outside. The coldness usually "makes my pain worse." Trying to be more patient with herself. Worked further on her anxiety as she continues trying to manage her physical pain and  frustrations. Trying more positive self-talk. Uses meditative prayers that do provide some comfort for her. Faith is very important. Friday of this week she is to have a device implanted that may help curb her pain. Is hopeful this will help. More encouraging of herself. Asking for help as needed.    Interventions: Cognitive Behavioral Therapy and Ego-Supportive  Long-term goal:  Elevate mood and show evidence of usual energy, activities, and socialization level. Short-term goal: Verbally identify the source of depressed mood and work to decrease its impact. Strategies: Identify cognitive self-talk that tends to support depression.  Verbalize hopeful and positive statements regarding her has not and future  Diagnosis:   ICD-10-CM   1. Generalized anxiety disorder  F41.1      Plan: Patient today reporting some improvement in her gaining of weight (for muscle mass that can help her pain per surgeon). No real improvement in her back pain. Is trying to be optimistic about upcoming procedure on Friday.  Encouraged mindfulness exercised, deep breathing, and contacts with supportive friends. Has been depressed about her physical pain and "just the winter which is darker and cold". Discussed with patient ways of having more light especially inside the home even if it's dark outside as may help mood also. Has a book she is reading that is helpful. Husband helpful however he as noted above has been having some health challenges recently himself. To remain on her meds as prescribed. Is trying to emphasize positive/open thinking as  she heads into procedure end of this week. Encouraged patient in her practice of more positive behaviors as discussed in session including: Remaining in contact with her support network including friends and neighbors, maintain healthy boundaries with others, remain in the present focusing what she can change her control, getting outside some each day as she is able, stay on her  prescribed medications, trying not to assume the negatives and worst-case scenarios, healthy nutrition and exercise as she is able, consider using deep breathing exercises as noted in session, remain in contact with her church which is very supportive, allow her faith to be an emotional support as well as spiritual, interrupt.'s of overthinking, and recognize the strengths she shows working with goal-directed behaviors to move in a direction that supports her improved emotional health and overall wellbeing.   Goal review and progress/challenges noted with patient.  Next appointment within 2 to 3 weeks.  This record has been created using [employer].  Chart creation errors have been sought, but may not always have been located and corrected.  Such creation errors do not reflect on the standard of medical care provided.   Shanon Ace, LCSW

## 2022-05-29 NOTE — Telephone Encounter (Signed)
Please advise 

## 2022-05-29 NOTE — Telephone Encounter (Signed)
Patient lvm at 2:28 stating that she has an appt with [facility] in February and that her gene site results will not be back until March. She would like to know if she should still come in for appt or push back until results come in. Ph: [phone]

## 2022-05-30 NOTE — Telephone Encounter (Signed)
They told her 5-8 weeks for results,she will reschedule

## 2022-05-30 NOTE — Telephone Encounter (Signed)
We used a different genetic test than usual.  I did not realize it was going to take that long to get results.  She must have been notified by the lab that the test would be delayed until March.  Please verify that this is the case.  If so then yes delay her appointment until the results are available in March.

## 2022-06-09 ENCOUNTER — Ambulatory Visit: Payer: Medicare Other

## 2022-06-09 VITALS — BP 118/66 | HR 78

## 2022-06-09 DIAGNOSIS — M810 Age-related osteoporosis without current pathological fracture: Secondary | ICD-10-CM | POA: Diagnosis not present

## 2022-06-09 MED ORDER — DENOSUMAB 60 MG/ML ~~LOC~~ SOSY
60.0000 mg | PREFILLED_SYRINGE | Freq: Once | SUBCUTANEOUS | Status: AC
  Administered 2022-06-09: 60 mg via SUBCUTANEOUS

## 2022-06-09 NOTE — Progress Notes (Signed)
Patient seen today for Prolia injection.  '60mg'$ /ml given in right arm subQ.  Patient tolerated well.  Follow up 6 months or as advised by provider.

## 2022-06-12 ENCOUNTER — Ambulatory Visit (INDEPENDENT_AMBULATORY_CARE_PROVIDER_SITE_OTHER): Payer: Medicare Other | Admitting: Psychiatry

## 2022-06-12 DIAGNOSIS — F411 Generalized anxiety disorder: Secondary | ICD-10-CM

## 2022-06-12 NOTE — Telephone Encounter (Signed)
Last Prolia inj 06/09/22 Next Prolia inj due 12/09/22

## 2022-06-12 NOTE — Progress Notes (Signed)
[facility] Counselor/Therapist Progress Note  Patient ID: Yolanda Carroll, MRN: [id number],    Date: 06/12/2022  Time Spent: 55 minutes  Treatment Type: Individual Therapy  Reported Symptoms: anxiety, depression  Mental Status Exam:  Appearance:   Casual     Behavior:  Appropriate, Sharing, and Motivated  Motor:  Normal  Speech/Language:   Clear and Coherent  Affect:  Anxious  Mood:  anxious and depressed  Thought process:  goal directed  Thought content:    WNL  Sensory/Perceptual disturbances:    WNL  Orientation:  oriented to person, place, time/date, situation, day of week, month of year, year, and stated date of Jan. 29, 2024  Attention:  Good  Concentration:  Good  Memory:  WNL  Fund of knowledge:   Good  Insight:    Good  Judgment:   Good  Impulse Control:  Good   Risk Assessment: Danger to Self:  No Self-injurious Behavior: No Danger to Others: No Duty to Warn:no Physical Aggression / Violence:No  Access to Firearms a concern: No  Gang Involvement:No   Subjective:   Patient in today reporting anxiety and depression with anxiety being her stronger symptom. Recently had peripheral nerve stimulator process done where a wire is implanted and wears another small device, all a part of a new procedure to help control her back pain. Not realizing much benefit yet (which can be normal) and expectation is to feel pain relief within a few weeks, adding she wasn't told a specific time frame. "Trying to be optimistic". Frustrated "that I can't give my prayer warriors at church some positive news and feel better." "Haven't given up but want to have less pain." Does have lots of support at her church and neighborhood. Has a good friend coming to stay a few days with her while her husband visits his mom in her 47's in [place]. More focus on "what helps" and states laying down is only time when she's  not in pain." "Need go back to dietician and find out what's better for my  bones". Paying more attention to what helps my pain versus what does not. Using devotional book that is helpful, "[employer]". Wears warm clothing as coldness does intensify her pain. Some days hard to be patient with herself. Continues trying to better manage her anxiety and frustrations in more helpful ways. Meditative prayers and nurturing self-talk are helpful daily, in providing comfort for patient. Is better about asking for help when needed.   Interventions: Cognitive Behavioral Therapy and Ego-Supportive  Long-term goal:  Elevate mood and show evidence of usual energy, activities, and socialization level. Short-term goal: Verbally identify the source of depressed mood and work to decrease its impact. Strategies: Identify cognitive self-talk that tends to support depression.  Verbalize hopeful and positive statements regarding her has not and future   Diagnosis:   ICD-10-CM   1. Generalized anxiety disorder  F41.1      Plan: Patient today motivated and actively participating in session today focusing on her anxiety primarily but also some depression. Feels she has held onto some benefits from when she went to [employer] due to some earlier issues within family, and how to recognize more "things within myself and how to manage in relationships better."  Continues to work on these issues along with the emotional stressors of chronic pain issues right now.  Continues to use deep breathing exercises, mindfulness, reading of particular books that are helpful, and close supportive friends to help  her in managing her current struggles with chronic pain, anxiety, and some depression.  Patient reports that she is thankful there are some positives on her journey and is trying to stay more emotionally balanced. Is making progress and needs to continue working with goal-directed behaviors in sessions and outside of sessions to continue moving in a forward direction emotionally and physically. Encouraged  patient and practicing positive behaviors as noted in session including: Staying in contact with her support network including friends and neighbors, maintain healthy boundaries with others, stay in the present focusing on what she can change or control, getting outside some each day as she is able, remain on her prescribed medications, trying not to assume negatives and worst-case scenarios, healthy nutrition and exercise as she is able, deep breathing exercises as noted in session, staying in contact with her church which is very supportive, allow her faith to be an emotional support as well as spiritual, interrupt overthinking, and recognize the strength she shows working with goal-directed behaviors to move in a direction that supports her improved emotional health and outlook.  Goal review and progress/challenges noted with patient.  Next appointment within 2 to 3 weeks.  This record has been created using [employer].  Chart creation errors have been sought, but may not always have been located and corrected.  Such creation errors do not reflect on the standard of medical care provided.   Shanon Ace, LCSW

## 2022-06-20 ENCOUNTER — Ambulatory Visit (INDEPENDENT_AMBULATORY_CARE_PROVIDER_SITE_OTHER): Payer: Medicare Other | Admitting: Psychiatry

## 2022-06-20 ENCOUNTER — Ambulatory Visit: Payer: Medicare Other | Admitting: Psychiatry

## 2022-06-20 ENCOUNTER — Encounter: Payer: Self-pay | Admitting: Psychiatry

## 2022-06-20 DIAGNOSIS — F3342 Major depressive disorder, recurrent, in full remission: Secondary | ICD-10-CM

## 2022-06-20 DIAGNOSIS — R110 Nausea: Secondary | ICD-10-CM

## 2022-06-20 DIAGNOSIS — F411 Generalized anxiety disorder: Secondary | ICD-10-CM | POA: Diagnosis not present

## 2022-06-20 DIAGNOSIS — F410 Panic disorder [episodic paroxysmal anxiety] without agoraphobia: Secondary | ICD-10-CM

## 2022-06-20 DIAGNOSIS — G43009 Migraine without aura, not intractable, without status migrainosus: Secondary | ICD-10-CM | POA: Diagnosis not present

## 2022-06-20 DIAGNOSIS — K219 Gastro-esophageal reflux disease without esophagitis: Secondary | ICD-10-CM

## 2022-06-20 DIAGNOSIS — F5105 Insomnia due to other mental disorder: Secondary | ICD-10-CM

## 2022-06-20 NOTE — Progress Notes (Signed)
Yolanda Carroll [id number] Mar 26, 1956 67 y.o.   Subjective:   Patient ID:  Yolanda Carroll is a 67 y.o. (DOB 08/11/1955) female.  Chief Complaint:  Chief Complaint  Patient presents with   Follow-up   Depression   Anxiety    Anxiety Symptoms include nausea and nervous/anxious behavior. Patient reports no confusion, decreased concentration or suicidal ideas.    Depression        Associated symptoms include appetite change.  Associated symptoms include no decreased concentration, no headaches and no suicidal ideas.  Past medical history includes anxiety.    Yolanda Carroll presents to the office today for follow-up of anxiety and migraine.  seen in July 2020.  No meds were changed.  She requires name brand Depakote because the generic caused worsening headaches.  Retired November 14, 2018.  Was so burned out at work.  Helping with church projects and helping friends.  Focusing on her health and doing PT for back and hip.  No kids or gkids.  Helps care for 43 yo M-in-law.  H is also busy which helps.  Given a surprise retirement party.     seen January 2021.  Nefazodone is no longer manufactured and she had to wean off.  We also discussed the potential of weaning off Depakote because Aimovig had been very effective at managing her migraine headache.  She called August 11, 2019 stating that she did taper off of Depakote because she did not feel like she needed it any more.  Her last dose was July 23, 2019 but afterwards noticed heartburn and swallowing issues.  Her GI doctor added Pepcid 40 mg daily.  Her neurologist had indicated that Depakote can sometimes help with esophageal issues and she wondered if that was connected.  She was given the option to restart a lower dose and perhaps taper more slowly.  Off Depakote for a month.  She elected not to restart it.  08/19/2019 appointment the following is noted: Tinnitus and mild anxiety worse off the Depakote and with some mind racing esp  in the AM.   Most is better. Reflux is worse off Depakote ER 250 and wondering if she should restart it.   Disc article on Depakote helping GERD by increasing lower esophageal sphincter tone. Has travelled some with family events.  M-in-law 48 soon.   HA good with Aimovig. Don't do season changes well and more HA in Spring often. Sleep not great lately with leg cramps.  Started more exercise.  Started snoring and H is a light sleeper.  Meds help. Plan no med changes.  10/03/2019 phone call with patient asking to restart residual nefazodone that she has on hand.  She felt that it helped back pain and she is experiencing more back pain recently.  She is aware that it is no longer manufactured to our knowledge and once she runs out it will not be available.  She indicated she had an off to take half of 150 mg nefazodone tablet for about 2 to 3 months and would like to do so.  11/05/2019 appointment with the following noted:  Appt moved earlier DT the following. Had stopped Depakote and nefazadone and reflux got worse.  Restarting Depakote didn't help much.  Restarted nefazodaone and reflux better right away.  May not need Depakote but doesn't want to change.  Can't exercise DT back pain since Jan ans worse since March.  Getting new doctor.  More depressed and anxious and sleep problems DT back pain.  Usually only 0.25 mg alprazolam at night. Plan: has to stop nefazodone when it runs out bc no longer manufactured.  Therefore will try trazodone in it's place.  01/07/20 appt with the following noted: She and H both got Covid.  Was pretty sick for 10 days.  Lost taste.  Had little resp stuff but severe diarrhea and nausea and couldn't keep fluids down.  No residual sx. H had resp sx with pneumonia and had to get O2.  Hosp for 3 days. Still have some nefazodone and nursing it along. Tried trazodone 50 HS for 2 nights and didn't sleep well and had a HA  Nefazodone and Depakote both helped the GERD.  She  has enough nefazadone for about 6 weeks. Plan: Nefazodone helped the reflux and she wants to take it as long as possible.  She didn't have withdrawal off nefazodone. When she runs out then start trazodone.  If that fails then use Viibryd.  Disc SE and alternatives.   Rec try trazodone again but use 100 mg and see if sleeping better prevents the AM HA.  If HA recurs then viibryd. Starter kit.  Depakote didn't help as much as expected and she might stop it.   02/18/2020 phone call: Yolanda Carroll called to report that she is experiencing high anxiety.  It is causing chest discomfort and breathing problems. Has appt 11/18 and is on the wait list but needs to discuss how to relief this anxiety.  Please call. Response: Note Rtc to patient, she does have the Viibryd and does remember that discussion. She has not been able to start that because she received a steroid injection a couple weeks ago and caused her stomach to be upset. She didn't want to start it knowing that's a side effect. She feels like the steroid injection may have worsened all her symptoms. She did try the trazodone but it was too much for her, even taking a 1/2 tablet. She does have some serzone left and has been taking that until this improves. Also taking Xanax as needed during day if needed. Hoping all of this will improve soon. Informed her I would update Dr. Clovis Pu with her information.        04/01/2020 appointment with the following noted: Out of nefazodone a few weeks ago.  More reflux off it.  Tried trazodone a couple of times.  Tried 100 mg trazodone and heart skipped.  50 mg awoke with chest tightness.  Long haul Covid with GI sx.  Nausea and anxiety problems daily since mid September.  Covid early August and got monoclonal infusion which helped.  Headed to long haul Covid clinic in [place] Monday.   Needed to increase alprazolam to BID.  And 80% better with that.  Panic some out of bed.  Sx started after steroid shot for her back 9/21.    Hasn't tried Viibryd DT fear of GI worsening.  Poor appetite and lost 15#. Not taken tramadol yet. Migraine under control. Plan: no med changes  06/01/20 appt with following noted: Starting generic Depakote ER generic today. Started Mirtazapine 3.75 mg and it worked right away for nausea for a week.  Then increased to 7.5 mg HS.  Most days is feeling really well.  No SE except mild drowsiness. Still has post Covid problems with stomach but much better.  Regained 7# of needed weight.  Need to try to gain 10# more. Zofran failed. Had a lot anxiety with N but it's better now.  Was waking with  panic and no longer N and anxiety better too. Severe scoliosis facing big surgery in summer with long recovery.  Can't ride in car for long. Plan: No med changes  08/18/2020 appointment with the following noted: Gained 12# and thankful for mirtazapine. Long Covid GI sx.  Better not gone with mirtazapine. Rare AM panic.  Sleep good. Scoliosis and may require surgery. Can't sit or stand for long Depakote brand ER 250 helps heartburn. Generic failed. Patient denies difficulty with sleep initiation with Xanax.. Denies appetite disturbance.  Patient reports that energy and motivation have been good.  Patient denies any difficulty with concentration.  Patient denies any suicidal ideation. More depressed and tearful with pain but varies with pain levels.  Good and bad days.  Not too depressed with anxiety worse. Plan: Continue mirtazapine 7.5 mg with Xanax for GI problems including nausea and appetite.  It has worked some.  Try higher dose. Insomnia managed with  With alprazolam.    11/18/2020 appointment with the following noted: Increased mirtazapine to 7.5 mg 1 and 1/2 tablet bc  less anxious with it. Facing big back surgery for severe scoliosis 11/30/20.  Can't drive in car for distance or travel. Now on Medicare. Depakote ER BRAND helped GI problems which were worse on generic. Not markedly depressed.   Patient reports stable mood and denies depressed or irritable moods.   Patient denies difficulty with sleep initiation or maintenance. Denies appetite disturbance.  Patient reports that energy and motivation have been good.  Patient denies any difficulty with concentration.  Patient denies any suicidal ideation. Plan: No med changes Continue Depakote brand ER 250 mg daily, continue alprazolam 0.25 mg nightly, continue mirtazapine 7.5 mg nightly  04/26/2021 appointment with the following noted: Still on hydrocodone from surgery on back in July.  Driving.  Healing slowly. Pain before surgery is gone now.  Now pain related to adjusting to rod. But surgery was successful. Fritz Pickerel has been helpful. Has reduced mirtazapine 3.75 mg HS to try to come off it.  Sleep a little worse if doesn't take it.  Stomach problems are better.   No HA in a year but started having migraines in a year.  Migraines started after change in logo of brand Depakote so restarted Aimovig.  Depakote also helped acid reflux. Mirtazapine helped sleep and appetite.  Weight is good.  Sleep is good with it still. Mood is OK. Active at church and people are supportive. Continue Depakote brand ER 250 mg daily, continue alprazolam 0.25 mg nightly, continue mirtazapine 7.5 mg nightly  08/03/2021 appointment the following noted: Not doing so good.  Was doing great after surgery. A few weeks ago the nausea and anxiety came back. Had reduced mirtazapine to 3.75 mg HS, back up to 7.5 mg HS about 2 weeks.  No effects so far. Mad and depressed and not me.  Nausea coming back has been upsetting. Pursuing GI work up. Awakens with anxiety.   No sleep trouble. Conc worse. Plan:Was Much better with with anxiety, appetite and sleep on mirtazapine 7.5 mg tablet 1 nightly and relapsed when decreased to 1/tablet nightly,  so increase to 1 &  1/2 of the 7.5 mg tablets HS .   09/27/2021 appointment with the following noted: Last few weeks  tough. Cholecystectomy 09/08/21 DT chronic nausea but not better.  Yesterday no nausea for first time in months. Losing weight on bland diet. But it stopped. Chronic back pain after surgery. SE mirtazapine dry mouth. Usually feels ok in AM  but afternoons gets nausea.   Xanax does help afternoon nausea so maybe it's anxiety. Exhausted from all of this.  Still blames some of it on covid. Not sleepy with Xanax.   Sad bc can't function normally.  Usually not depressed. Sleep is fine. Plan:Was Much better with with anxiety, appetite and sleep on mirtazapine 7.5 mg tablet 1 nightly initially.  Nausea, depression and anxiety Are back again. Option increase to 15 mg .  Yes.  10/26/2021 appointment with the following noted: It helped some.  Still some nausea.  CT scan next week.  Off and on nausea without pattern.  Not clearly anxiety but Xanax prn does help.  Frustrating that it is not predictable. Tolerated in crease in mirtazapine 15 with mild hangover.  Still some anxiety first thing in AM but not as bad as it was and is worse without Xanax.  No reason for the anxiety.   Not depressed but discouraged.  Tries to stay active and involved. Sleep is great always with Xanax. Chronic opiates DT back surgery for scoliosis Plan: Continue Xanax 0.25 mg BID for panic and GAD , but option increase TID bc helps nausea.  11/10/21 TC: Pt called reporting CT scan results fine. Requesting as discussed to change Mirtazepine to Olanzapine. MD: As discussed at the last office visit, if her GI work-up did not give a solution to her abdominal problems then it would be reasonable to try olanzapine at low dose.  I have sent in a prescription for 2.5 mg 1 nightly.  That is the lowest dose made.  If she uses a pill splitter she may try cutting it in half.  1/2 tablet may be sufficient for her.  She could experience benefit within the first day or 2 but give it a week or 2 as long as she is tolerating it to be sure. In order  to minimize the risk of daytime sleepiness take the olanzapine 2 to 3 hours before bedtime.  12/15/21 TC: Patient said she had increased the olanzapine and is still c/o headache and has had some dizziness today. She said she did "okay" on the mirtazapine. She said you had her on 1-1/2 tabs of 7.5. She said it probably isn't 100%, but felt she did okay otherwise.   01/11/22 appt noted: No solution to GI px.  Was told to get off hydrocodone but can't DT pain. Olanzapine tried with SE dizziness and HA after a month.  Without much benefit over mirtazapine. Overall ok.  Still some anxiety which doesn't want to eat but doing ok. Mirtazapine 7.5 mg HS helps. Sleep good.   Started Finger, good. About the same with mood  with some depression over heallth problems. Sees surgeon in October about pain problems. No panic but awakens with anxiety and general worry.   Less nausea.  02/27/22 TC: Does not appear that she is receiving any benefit from mirtazapine.  Mentioned in my notes that the obvious antidepressant choice would be duloxetine because it will sometimes help with pain as well as depression and anxiety. Stop mirtazapine and start duloxetine 20 mg capsule 1 daily for 1 week then 2 capsules daily. It will take 2 to 4 weeks to get any benefit from the duloxetine.  I would suggest in the interim that she try something that is stronger to help with nausea and anxiety but is likely to be insufficiently helpful for depression, therefore the need for duloxetine. So in addition to the duloxetine start olanzapine at  the lowest dose 2.5 mg tablet 1 nightly which could help with nausea appetite and anxiety almost right away.  If it causes too much sleepiness have her take it 2 to 3 hours before bedtime and she can even cut it in half if she has to. I will send in both prescriptions.  03/06/22 TC:  On 10/16 you wanted pt to take olanzapine and Cymbalta to replace mirtazapine.She did not start the  Cymbalta but did take olanzapine.She had side effects including insomnia,hand tremors,blurred vision and increased back pain.She decided to stop the med.She still wants to try Cymbalta but wants to know if it's ok to take with mirtazapine because she decided to start back taking it.     04/13/22 appointment noted:  Tried olanzapine twice with hand tremor and blurred vision. Back to mirtazapine 15 mg HS.   Less nausea.  Able to gain 6 # but no appetite. Bottomed out in October. Never tried Cymbalta.  Some concern over SE with it. Increased gabapentin to help pain with a little benefit. 300 BID and tolerating. In pain all day.  Tried Nucynta without help.   Mood is better than it was.  Working to accept chronic pain and limited activithy.  04/20/23 TC  Pt said that the cymbalta is making her jittery and more anxious. She has cut the mirtazapine to 7.'5mg'$  not sure if working . Wants something different     MD response:   She is taking the lowest dose of dulxetine and not tolerating it so stop it. I don't have antoher option that will help pain.   If she thinks she is getting some benefit with mirtazapine then continue it.  If not stop it too I would also suggest she come get the [place] genetic test I discussed with her.      06/20/22 appt noted: Mirtazapine helped regain some wt and nausea is better for 6-8 weeks. Currently on 7.5 mg mirtazapine and was up to 15 mg daily. Still some anxiety issues and needs Xanax. Heart burn without Depakote.  It's ok right now.   Peripheral nn stimulator in her back temporary for 2 mos.  No benefit so far.  Chronic pain.   Still seeing Debbie. Not markedly depressed.    Saw neurologist for Migraine and had problems with the meds RX.  Aimovig has helped..    Past Psychiatric Medication Trials: Failed multiple other antidepressants,  Celexa chest pain Duloxetine SE Nortriptyline hyper Hydroxyzine SE  nefazodone & Depakote for migraine,  Depakote  brand ER 250 helps heartburn. Generic failed. Mirtazapine 7.5 mg benefit Olanzapine 2.5 mg HS with SE dizziness and HA citalopram palpitations Trazodone Clonazepam did not help anxiety, alprazolam helps anxiety and sleep   Review of Systems:  Review of Systems  Constitutional:  Positive for appetite change.  Gastrointestinal:  Positive for abdominal pain and nausea.  Musculoskeletal:  Positive for arthralgias and back pain.  Neurological:  Negative for tremors and headaches.  Psychiatric/Behavioral:  Positive for dysphoric mood. Negative for agitation, behavioral problems, confusion, decreased concentration, hallucinations, self-injury, sleep disturbance and suicidal ideas. The patient is nervous/anxious. The patient is not hyperactive.     Medications: I have reviewed the patient's current medications.  Current Outpatient Medications  Medication Sig Dispense Refill   AIMOVIG 140 MG/ML SOAJ INJECT 140 MG INTO SKIN EVERY 30 DAYS 3 mL 3   ALPRAZolam (XANAX) 0.25 MG tablet Take 1 tablet (0.25 mg total) by mouth 3 (three) times daily as needed for anxiety.  270 tablet 1   Ascorbic Acid (VITAMIN C PO) Take 500 mg by mouth daily.     Calcium Carbonate (CALCIUM 600 PO) Take 600-1,200 mg by mouth daily.     cholecalciferol (VITAMIN D3) 25 MCG (1000 UNIT) tablet Take 1,000 Units by mouth daily.     conjugated estrogens (PREMARIN) vaginal cream Place 1 Applicatorful vaginally 2 (two) times a week. 42.5 g 6   DEPAKOTE ER 250 MG 24 hr tablet TAKE 1 TABLET BY MOUTH DAILY 90 tablet 3   famotidine (PEPCID) 40 MG tablet Take 1 tablet (40 mg total) by mouth daily. 30 tablet 3   gabapentin (NEURONTIN) 300 MG capsule Take 300 mg by mouth every evening. 1 tab TID     HYDROcodone-acetaminophen (NORCO) 7.5-325 MG tablet Take 1.5 tablets by mouth every 6 (six) hours as needed for moderate pain.     Lactobacillus (ACIDOPHILUS) 100 MG CAPS Take 100 mg by mouth daily.     lansoprazole (PREVACID) 30 MG capsule  Take 30 mg by mouth 2 (two) times daily before a meal.     methocarbamol (ROBAXIN) 750 MG tablet Take 750 mg by mouth 3 (three) times daily.     mirtazapine (REMERON) 15 MG tablet Take 15 mg by mouth at bedtime. 1/2 TAB hs     Multiple Vitamin (MULTIVITAMIN) tablet Take 1 tablet by mouth daily.     ondansetron (ZOFRAN) 4 MG tablet Take 1 tablet (4 mg total) by mouth daily as needed for nausea or vomiting. 60 tablet 3   polyethylene glycol (MIRALAX / GLYCOLAX) 17 g packet Take 17 g by mouth daily.     Probiotic Product (ALIGN) 4 MG CAPS Take 4 mg by mouth daily.     No current facility-administered medications for this visit.    Medication Side Effects: None  Allergies:  Allergies  Allergen Reactions   Penicillins Other (See Comments)    Seizures as a child   Atarax [Hydroxyzine] Other (See Comments)    Headache, depression   Bactrim [Sulfamethoxazole-Trimethoprim] Nausea Only   Celexa [Citalopram Hydrobromide] Other (See Comments)    Chest pain   Erythromycin      Upset stomach   Flagyl [Metronidazole]     Dizzy and increased heart rate   Nortriptyline Itching   Orphenadrine     Hand tremors   Prilosec [Omeprazole]     Abdominal pain   Requip [Ropinirole]     Made sx worse   Darvocet [Propoxyphene N-Acetaminophen] Itching   Percocet [Oxycodone-Acetaminophen] Itching    Past Medical History:  Diagnosis Date   Allergy    Anxiety    Arthritis    Cervical dysplasia    Diverticulitis    Endometriosis    Fatigue    Gallstones    GERD (gastroesophageal reflux disease)    History of hiatal hernia    pt states she currently has a hiatal hernia   IBS (irritable bowel syndrome)    Migraine    Osteopenia    Osteoporosis 01/2019   T score -3.1   Osteoporosis    Plantar fasciitis    PONV (postoperative nausea and vomiting)    also difficult to wake up   Recurrent vaginitis    Reflux    Scoliosis    Wears glasses     Family History  Problem Relation Age of Onset    Cancer Father        lymphoma   Other Mother        bipolar,reflux  Bipolar disorder Mother    Other Brother        sinus problems   Cancer Maternal Aunt        uterine cancer   Breast cancer Maternal Aunt        40's   Diabetes Maternal Aunt    Cancer Paternal Aunt        Colon cancer   Breast cancer Cousin 61       Mat. 1st cousin    Social History   Socioeconomic History   Marital status: Married    Spouse name: Fritz Pickerel   Number of children: 0   Years of education: 16   Highest education level: Not on file  Occupational History    Comment: [employer]  Tobacco Use   Smoking status: Former    Types: Cigarettes    Quit date: 05/17/1983    Years since quitting: 39.1   Smokeless tobacco: Never  Vaping Use   Vaping Use: Never used  Substance and Sexual Activity   Alcohol use: Not Currently   Drug use: No   Sexual activity: Not Currently    Birth control/protection: Post-menopausal    Comment: intercourse age 49 , sexual partners more than 5  Other Topics Concern   Not on file  Social History Narrative   Pt is married, no children.  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    Caffeine- very little.   Right handed   Lives at home with her husband   Social Determinants of Health   Financial Resource Strain: Not on file  Food Insecurity: Not on file  Transportation Needs: Not on file  Physical Activity: Not on file  Stress: Not on file  Social Connections: Not on file  Intimate Partner Violence: Not on file    Past Medical History, Surgical history, Social history, and Family history were reviewed and updated as appropriate.   Please see review of systems for further details on the patient's review from today.   Objective:   Physical Exam:  There were no vitals taken for this visit.  Physical Exam Constitutional:      General: She is not in acute distress. Musculoskeletal:        General: No deformity.   Neurological:     Mental Status: She is alert and oriented to person, place, and time.     Cranial Nerves: No dysarthria.     Coordination: Coordination normal.  Psychiatric:        Attention and Perception: Attention and perception normal. She does not perceive auditory or visual hallucinations.        Mood and Affect: Mood is anxious and depressed. Affect is not labile, blunt, tearful or inappropriate.        Speech: Speech normal.        Behavior: Behavior normal. Behavior is cooperative.        Thought Content: Thought content normal. Thought content is not paranoid or delusional. Thought content does not include homicidal or suicidal ideation. Thought content does not include suicidal plan.        Cognition and Memory: Cognition and memory normal.        Judgment: Judgment normal.     Comments: Insight intact Some residual anxiety chronically      Lab Review:     Component Value Date/Time   NA 140 08/31/2021 1100   NA 134 03/26/2020 1056   K 4.4 08/31/2021 1100   CL 103 08/31/2021 1100  CO2 31 08/31/2021 1100   GLUCOSE 98 08/31/2021 1100   BUN 7 (L) 08/31/2021 1100   BUN 8 03/26/2020 1056   CREATININE 0.73 08/31/2021 1100   CALCIUM 9.4 08/31/2021 1100   PROT 6.9 08/31/2021 1100   ALBUMIN 4.0 08/31/2021 1100   AST 18 08/31/2021 1100   ALT 13 08/31/2021 1100   ALKPHOS 36 (L) 08/31/2021 1100   BILITOT 0.6 08/31/2021 1100   GFRNONAA >60 08/31/2021 1100   GFRAA 91 03/26/2020 1056       Component Value Date/Time   WBC 4.2 08/31/2021 1100   RBC 4.18 08/31/2021 1100   HGB 12.8 08/31/2021 1100   HCT 40.1 08/31/2021 1100   PLT 205 08/31/2021 1100   MCV 95.9 08/31/2021 1100   MCH 30.6 08/31/2021 1100   MCHC 31.9 08/31/2021 1100   RDW 12.4 08/31/2021 1100   LYMPHSABS 0.7 12/21/2019 0949   MONOABS 0.6 12/21/2019 0949   EOSABS 0.0 12/21/2019 0949   BASOSABS 0.0 12/21/2019 0949    Normal liver enzymes in September.  No results found for: "POCLITH", "LITHIUM"   No  results found for: "PHENYTOIN", "PHENOBARB", "VALPROATE", "CBMZ"   .res Assessment: Plan:    Kaley was seen today for follow-up, depression and anxiety.  Diagnoses and all orders for this visit:  Recurrent major depression in complete remission ([place])  Generalized anxiety disorder  Panic disorder without agoraphobia  Migraine without aura and without status migrainosus, not intractable  Nausea without vomiting  Insomnia due to mental condition  Gastroesophageal reflux disease without esophagitis   Greater than 50% of 40 min non face to face time with patient was spent on counseling and coordination of care. We discussed the following. She's worse again with more physical problems  this is affecting mood.  Was Much better with with anxiety, appetite and sleep on mirtazapine 7.5 mg tablet 1 nightly initially.  Nausea, depression and anxiety Are back again.  Continue mirtazapine 15 mg .  Yes or she can stop it if desired.  Cont Depakote for reflux.  Depakote ER BRAND helped GI problems which were worse on generic.  But she wants to retry stopping it.  Supportive therapy dealing with health problems.  Back and stomach pain and problems. Frustrated.    Answered questions about CBD  We discussed the short-term risks associated with benzodiazepines including sedation and increased fall risk among others.  Discussed long-term side effect risk including dependence, potential withdrawal symptoms, and the potential eventual d  ose-related risk of dementia. Disc newer studies that cast in doubt the relationship with dementia.  Not drowsy with alprazolam. Disc warnings about combo of Bz and opiates not relevant at this low dose. BC morning anxiety could try switching to Valium and she doesn't want to try it. Continue Xanax 0.25 mg BID for panic and GAD , but option increase TID bc helps nausea.  Consider Genesight testing bc med sensitivity and failures. ACTX partially reviewed but sight  is poorly designed and results difficult to see.  Disc this in detail available.  No med changes  FU 2 mos  Lynder Parents, MD, DFAPA  Please see After Visit Summary for patient specific instructions.  Future Appointments  Date Time Provider [place]  07/03/2022 11:00 AM Shanon Ace, LCSW [facility] None  07/31/2022 11:00 AM Shanon Ace, LCSW [facility] None  10/05/2022  2:00 PM Princess Bruins, MD [facility] None  10/24/2022 11:00 AM Ubaldo Glassing, Amy, NP [facility] None  12/12/2022 10:00 AM [facility] NURSE [facility] None  04/02/2023  10:20 AM Philemon Kingdom, MD [facility] None    No orders of the defined types were placed in this encounter.     -------------------------------

## 2022-07-03 ENCOUNTER — Ambulatory Visit (INDEPENDENT_AMBULATORY_CARE_PROVIDER_SITE_OTHER): Payer: Medicare Other | Admitting: Psychiatry

## 2022-07-03 DIAGNOSIS — F3342 Major depressive disorder, recurrent, in full remission: Secondary | ICD-10-CM

## 2022-07-03 NOTE — Progress Notes (Signed)
[facility] Counselor/Therapist Progress Note  Patient ID: Yolanda Carroll, MRN: [id number],    Date: 07/03/2022  Time Spent:  55 minutes  Treatment Type: Individual Therapy  Reported Symptoms:  some slight hopefulness, still struggling with back pain and continues her treatment; anxiety not quite as "crippling as it was", depression (improving), anxiety  Mental Status Exam:  Appearance:   Casual     Behavior:  Appropriate, Sharing, and Motivated  Motor:  Normal  Speech/Language:   Clear and Coherent  Affect:  Anxious, depression  Mood:  anxious and depressed  Thought process:  goal directed  Thought content:    Rumination  Sensory/Perceptual disturbances:    WNL  Orientation:  oriented to person, place, time/date, situation, day of week, month of year, year, and stated date of Feb. 19, 2024  Attention:  Good  Concentration:  Good  Memory:  WNL  Fund of knowledge:   Good  Insight:    Good  Judgment:   Good  Impulse Control:  Good   Risk Assessment: Danger to Self:  No Self-injurious Behavior: No Danger to Others: No Duty to Warn:no Physical Aggression / Violence:No  Access to Firearms a concern: No  Gang Involvement:No   Subjective:  Patient in today and after she got to talking, she began to realizes some progress which very different. States "just a little progress" but then is able elaborate more and name several indications of progress including being able to move a little better, can eat more without so many restrictions, perkier, less pain meds, looking forward to things and not as moody, laughing more, and overall mood some better. Depression has lightened up some more recently.Some bit of less pain in back and noticing she is able to do more than previously so starting to have some optimism and processed this at length today and how it has so impacted her mood at times. Smiling more today. Had good visit with a girlfriend past 3-4 days which went well. Getting  info from dietician about what foods are healthy for her bones. Used devotional book some which is helpful. ([employer]). States warm clothing helps as warmth definitely helps her pain. Hard to be patient at times but getting better with that per her report. Reports positive self-talk is helpful, meditative prayers are a comfort. Still asking for help when needed. Thankful to be "even a little better."    Interventions: Cognitive Behavioral Therapy and Ego-Supportive  Long-term goal:  Elevate mood and show evidence of usual energy, activities, and socialization level. Short-term goal: Verbally identify the source of depressed mood and work to decrease its impact. Strategies: Identify cognitive self-talk that tends to support depression.  Verbalize hopeful and positive statements regarding her has not and future   Diagnosis:   ICD-10-CM   1. Recurrent major depression in complete remission ([place])  F33.42      Plan: Patient today actively participating and showing good motivation in session as she shares some bits of progress and trying to hope that she will continue to get better over time. Continues to uses deep breathing exercises, reading helpful books, mindfulness, PT, and supportive friendships, all of which are helping. Continues to work on these along with the emotional stressors of chronic pain issues right now.  Thankful for her church, friendships and her husband as she tries to stay emotionally balanced. Making progress as she works with goal directed behaviors to continue moving in a positive, forward direction for her physical and  emotional health. Encouraged patient in her practice of positive and self affirming behaviors as noted in session including: Remaining in contact with supportive network of friends and neighbors, maintain healthy boundaries with others, stay in the present focusing what she can change or control, getting outside some each day as she is able, trying not to  assume negatives and worst-case scenarios, staying on her prescribed medication, healthy nutrition and exercise, deep breathing exercises as noted in session, staying in contact with her church which is very supportive, allow her faith to be an emotional support as well as spiritual, interrupt overthinking, and realize the strength she shows working with goal-directed behaviors to move in a direction that supports her improved emotional health and overall wellbeing.  Goal review and progress/challenges noted with patient.  Next appointment within 2 to 3 weeks.  This record has been created using [employer].  Chart creation errors have been sought, but may not always have been located and corrected.  Such creation errors do not reflect on the standard of medical care provided.   Shanon Ace, LCSW

## 2022-07-25 ENCOUNTER — Ambulatory Visit: Payer: Medicare Other | Admitting: Psychiatry

## 2022-07-31 ENCOUNTER — Ambulatory Visit (INDEPENDENT_AMBULATORY_CARE_PROVIDER_SITE_OTHER): Payer: Medicare Other | Admitting: Psychiatry

## 2022-07-31 DIAGNOSIS — F411 Generalized anxiety disorder: Secondary | ICD-10-CM | POA: Diagnosis not present
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
## 2022-08-08 ENCOUNTER — Other Ambulatory Visit: Payer: Self-pay | Admitting: Gastroenterology

## 2022-08-21 ENCOUNTER — Other Ambulatory Visit: Payer: Self-pay

## 2022-08-21 DIAGNOSIS — M810 Age-related osteoporosis without current pathological fracture: Secondary | ICD-10-CM

## 2022-08-23 ENCOUNTER — Encounter: Payer: Self-pay | Admitting: Family Medicine

## 2022-09-05 ENCOUNTER — Ambulatory Visit (INDEPENDENT_AMBULATORY_CARE_PROVIDER_SITE_OTHER): Payer: Medicare Other | Admitting: Psychiatry

## 2022-09-05 DIAGNOSIS — F411 Generalized anxiety disorder: Secondary | ICD-10-CM | POA: Diagnosis not present

## 2022-09-05 NOTE — Progress Notes (Signed)
[facility] Counselor/Therapist Progress Note  Patient ID: Yolanda Carroll, MRN: [id number],    Date: 09/05/2022  Time Spent: 50 minutes   Treatment Type: Individual Therapy  Reported Symptoms: anxiety(much improved)  Mental Status Exam:  Appearance:   Neat     Behavior:  Appropriate, Sharing, and Motivated  Motor:  Normal  Speech/Language:   Clear and Coherent  Affect:  Appropriate  Mood:  Anxiety--much  improved  Thought process:  goal directed  Thought content:    WNL  Sensory/Perceptual disturbances:    WNL  Orientation:  oriented to person, place, time/date, situation, day of week, month of year, year, and stated date of September 05, 2022  Attention:  Good  Concentration:  Good  Memory:  WNL  Fund of knowledge:   Good  Insight:    Good  Judgment:   Good  Impulse Control:  Good   Risk Assessment: Danger to Self:  No Self-injurious Behavior: No Danger to Others: No Duty to Warn:no Physical Aggression / Violence:No  Access to Firearms a concern: No  Gang Involvement:No   Subjective: Patient in today and anxiety is much improved. Realizing the gains she has made emotionally and mentally. Is coping much better with the changes that she has encountered with her back problems,  surgery, and recuperation which has had some challenges and is still a work in progress as far as continuing to heal. Has made progress and is practicing strategies that are helpful to her. Agreed that for now, she will continue strategies on her own and can call back if needed for further assistance. Eating better and not losing weight now. Better at asking for help as needed. Devotions helps. Has good support and good friends. Still on meds with her pain management Dr and that helps significantly. Smiling more today. Dressed very neat. Positive self-talk and thankful for good friends that are encouraging her.   Interventions: Cognitive Behavioral Therapy and Ego-Supportive  Long-term goal:   Elevate mood and show evidence of usual energy, activities, and socialization level. Short-term goal: Verbally identify the source of depressed mood and work to decrease its impact. Strategies: Identify cognitive self-talk that tends to support depression.  Verbalize hopeful and positive statements regarding her has not and future   Diagnosis:   ICD-10-CM   1. Generalized anxiety disorder  F41.1      Plan: Patient in today and doing better with her pain management for her back and is getting back into some former activities including playing handbells at her church, and go to lunch with friends more often, and staying more engaged in activities and connected with friends. States she's gotten some good strategies for coping with her pain and limited mobility which has improved over time through therapy and is at a point where she is ready to try managing emotionally without therapy, but using the tools she has learned. Good motivation and participation today.  Feeling more optimism in spite of still working with some pain management.  Encouraged patient to continue using deep breathing exercises, PT exercises, reading helpful books, mindfulness, and staying involved with friends who are supportive and helpful to her, along with maintaining contact with her church family.   Goal review and progress/challenges noted with patient.  This record has been created using [employer].  Chart creation errors have been sought, but may not always have been located and corrected.  Such creation errors do not reflect on the standard of medical care provided.  Shanon Ace, LCSW

## 2022-09-18 ENCOUNTER — Ambulatory Visit (INDEPENDENT_AMBULATORY_CARE_PROVIDER_SITE_OTHER): Payer: Medicare Other | Admitting: Psychiatry

## 2022-09-18 ENCOUNTER — Encounter: Payer: Self-pay | Admitting: Psychiatry

## 2022-09-18 DIAGNOSIS — G43009 Migraine without aura, not intractable, without status migrainosus: Secondary | ICD-10-CM | POA: Diagnosis not present

## 2022-09-18 DIAGNOSIS — F411 Generalized anxiety disorder: Secondary | ICD-10-CM | POA: Diagnosis not present

## 2022-09-18 DIAGNOSIS — K219 Gastro-esophageal reflux disease without esophagitis: Secondary | ICD-10-CM

## 2022-09-18 DIAGNOSIS — F410 Panic disorder [episodic paroxysmal anxiety] without agoraphobia: Secondary | ICD-10-CM

## 2022-09-18 DIAGNOSIS — F3342 Major depressive disorder, recurrent, in full remission: Secondary | ICD-10-CM | POA: Diagnosis not present

## 2022-09-18 DIAGNOSIS — F5105 Insomnia due to other mental disorder: Secondary | ICD-10-CM

## 2022-09-18 MED ORDER — DEPAKOTE ER 250 MG PO TB24: 250.0000 mg | ORAL_TABLET | Freq: Every day | ORAL | 3 refills | Status: DC

## 2022-09-18 MED ORDER — ALPRAZOLAM 0.25 MG PO TABS: 0.2500 mg | ORAL_TABLET | Freq: Three times a day (TID) | ORAL | 1 refills | Status: DC | PRN

## 2022-09-18 NOTE — Progress Notes (Signed)
Yolanda Carroll [id number] 21-May-1955 67 y.o.   Subjective:   Patient ID:  Yolanda Carroll is a 67 y.o. (DOB 12-Jul-1955) female.  Chief Complaint:  Chief Complaint  Patient presents with   Follow-up   Anxiety   Depression    Anxiety Symptoms include nausea and nervous/anxious behavior. Patient reports no confusion, decreased concentration or suicidal ideas.    Depression        Associated symptoms include appetite change.  Associated symptoms include no decreased concentration and no suicidal ideas.  Past medical history includes anxiety.    Yolanda Carroll presents to the office today for follow-up of anxiety and migraine.  seen in July 2020.  No meds were changed.  She requires name brand Depakote because the generic caused worsening headaches.  Retired November 14, 2018.  Was so burned out at work.  Helping with church projects and helping friends.  Focusing on her health and doing PT for back and hip.  No kids or gkids.  Helps care for 75 yo M-in-law.  H is also busy which helps.  Given a surprise retirement party.     seen January 2021.  Nefazodone is no longer manufactured and she had to wean off.  We also discussed the potential of weaning off Depakote because Aimovig had been very effective at managing her migraine headache.  She called August 11, 2019 stating that she did taper off of Depakote because she did not feel like she needed it any more.  Her last dose was July 23, 2019 but afterwards noticed heartburn and swallowing issues.  Her GI doctor added Pepcid 40 mg daily.  Her neurologist had indicated that Depakote can sometimes help with esophageal issues and she wondered if that was connected.  She was given the option to restart a lower dose and perhaps taper more slowly.  Off Depakote for a month.  She elected not to restart it.  08/19/2019 appointment the following is noted: Tinnitus and mild anxiety worse off the Depakote and with some mind racing esp in the AM.    Most is better. Reflux is worse off Depakote ER 250 and wondering if she should restart it.   Disc article on Depakote helping GERD by increasing lower esophageal sphincter tone. Has travelled some with family events.  M-in-law 92 soon.   HA good with Aimovig. Don't do season changes well and more HA in Spring often. Sleep not great lately with leg cramps.  Started more exercise.  Started snoring and H is a light sleeper.  Meds help. Plan no med changes.  10/03/2019 phone call with patient asking to restart residual nefazodone that she has on hand.  She felt that it helped back pain and she is experiencing more back pain recently.  She is aware that it is no longer manufactured to our knowledge and once she runs out it will not be available.  She indicated she had an off to take half of 150 mg nefazodone tablet for about 2 to 3 months and would like to do so.  11/05/2019 appointment with the following noted:  Appt moved earlier DT the following. Had stopped Depakote and nefazadone and reflux got worse.  Restarting Depakote didn't help much.  Restarted nefazodaone and reflux better right away.  May not need Depakote but doesn't want to change.  Can't exercise DT back pain since Jan ans worse since March.  Getting new doctor.  More depressed and anxious and sleep problems DT back pain.  Usually only 0.25 mg alprazolam at night. Plan: has to stop nefazodone when it runs out bc no longer manufactured.  Therefore will try trazodone in it's place.  01/07/20 appt with the following noted: She and H both got Covid.  Was pretty sick for 10 days.  Lost taste.  Had little resp stuff but severe diarrhea and nausea and couldn't keep fluids down.  No residual sx. H had resp sx with pneumonia and had to get O2.  Hosp for 3 days. Still have some nefazodone and nursing it along. Tried trazodone 50 HS for 2 nights and didn't sleep well and had a HA  Nefazodone and Depakote both helped the GERD.  She has enough  nefazadone for about 6 weeks. Plan: Nefazodone helped the reflux and she wants to take it as long as possible.  She didn't have withdrawal off nefazodone. When she runs out then start trazodone.  If that fails then use Viibryd.  Disc SE and alternatives.   Rec try trazodone again but use 100 mg and see if sleeping better prevents the AM HA.  If HA recurs then viibryd. Starter kit.  Depakote didn't help as much as expected and she might stop it.   02/18/2020 phone call: [name] called to report that she is experiencing high anxiety.  It is causing chest discomfort and breathing problems. Has appt 11/18 and is on the wait list but needs to discuss how to relief this anxiety.  Please call. Response: Note Rtc to patient, she does have the Viibryd and does remember that discussion. She has not been able to start that because she received a steroid injection a couple weeks ago and caused her stomach to be upset. She didn't want to start it knowing that's a side effect. She feels like the steroid injection may have worsened all her symptoms. She did try the trazodone but it was too much for her, even taking a 1/2 tablet. She does have some serzone left and has been taking that until this improves. Also taking Xanax as needed during day if needed. Hoping all of this will improve soon. Informed her I would update Dr. Jennelle Human with her information.        04/01/2020 appointment with the following noted: Out of nefazodone a few weeks ago.  More reflux off it.  Tried trazodone a couple of times.  Tried 100 mg trazodone and heart skipped.  50 mg awoke with chest tightness.  Long haul Covid with GI sx.  Nausea and anxiety problems daily since mid September.  Covid early August and got monoclonal infusion which helped.  Headed to long haul Covid clinic in [place] Monday.   Needed to increase alprazolam to BID.  And 80% better with that.  Panic some out of bed.  Sx started after steroid shot for her back 9/21.   Hasn't tried  Viibryd DT fear of GI worsening.  Poor appetite and lost 15#. Not taken tramadol yet. Migraine under control. Plan: no med changes  06/01/20 appt with following noted: Starting generic Depakote ER generic today. Started Mirtazapine 3.75 mg and it worked right away for nausea for a week.  Then increased to 7.5 mg HS.  Most days is feeling really well.  No SE except mild drowsiness. Still has post Covid problems with stomach but much better.  Regained 7# of needed weight.  Need to try to gain 10# more. Zofran failed. Had a lot anxiety with N but it's better now.  Was waking with  panic and no longer N and anxiety better too. Severe scoliosis facing big surgery in summer with long recovery.  Can't ride in car for long. Plan: No med changes  08/18/2020 appointment with the following noted: Gained 12# and thankful for mirtazapine. Long Covid GI sx.  Better not gone with mirtazapine. Rare AM panic.  Sleep good. Scoliosis and may require surgery. Can't sit or stand for long Depakote brand ER 250 helps heartburn. Generic failed. Patient denies difficulty with sleep initiation with Xanax.. Denies appetite disturbance.  Patient reports that energy and motivation have been good.  Patient denies any difficulty with concentration.  Patient denies any suicidal ideation. More depressed and tearful with pain but varies with pain levels.  Good and bad days.  Not too depressed with anxiety worse. Plan: Continue mirtazapine 7.5 mg with Xanax for GI problems including nausea and appetite.  It has worked some.  Try higher dose. Insomnia managed with  With alprazolam.    11/18/2020 appointment with the following noted: Increased mirtazapine to 7.5 mg 1 and 1/2 tablet bc  less anxious with it. Facing big back surgery for severe scoliosis 11/30/20.  Can't drive in car for distance or travel. Now on Medicare. Depakote ER BRAND helped GI problems which were worse on generic. Not markedly depressed.  Patient reports  stable mood and denies depressed or irritable moods.   Patient denies difficulty with sleep initiation or maintenance. Denies appetite disturbance.  Patient reports that energy and motivation have been good.  Patient denies any difficulty with concentration.  Patient denies any suicidal ideation. Plan: No med changes Continue Depakote brand ER 250 mg daily, continue alprazolam 0.25 mg nightly, continue mirtazapine 7.5 mg nightly  04/26/2021 appointment with the following noted: Still on hydrocodone from surgery on back in July.  Driving.  Healing slowly. Pain before surgery is gone now.  Now pain related to adjusting to rod. But surgery was successful. Peyton Najjar has been helpful. Has reduced mirtazapine 3.75 mg HS to try to come off it.  Sleep a little worse if doesn't take it.  Stomach problems are better.   No HA in a year but started having migraines in a year.  Migraines started after change in logo of brand Depakote so restarted Aimovig.  Depakote also helped acid reflux. Mirtazapine helped sleep and appetite.  Weight is good.  Sleep is good with it still. Mood is OK. Active at church and people are supportive. Continue Depakote brand ER 250 mg daily, continue alprazolam 0.25 mg nightly, continue mirtazapine 7.5 mg nightly  08/03/2021 appointment the following noted: Not doing so good.  Was doing great after surgery. A few weeks ago the nausea and anxiety came back. Had reduced mirtazapine to 3.75 mg HS, back up to 7.5 mg HS about 2 weeks.  No effects so far. Mad and depressed and not me.  Nausea coming back has been upsetting. Pursuing GI work up. Awakens with anxiety.   No sleep trouble. Conc worse. Plan:Was Much better with with anxiety, appetite and sleep on mirtazapine 7.5 mg tablet 1 nightly and relapsed when decreased to 1/tablet nightly,  so increase to 1 &  1/2 of the 7.5 mg tablets HS .   09/27/2021 appointment with the following noted: Last few weeks tough. Cholecystectomy  09/08/21 DT chronic nausea but not better.  Yesterday no nausea for first time in months. Losing weight on bland diet. But it stopped. Chronic back pain after surgery. SE mirtazapine dry mouth. Usually feels ok in AM  but afternoons gets nausea.   Xanax does help afternoon nausea so maybe it's anxiety. Exhausted from all of this.  Still blames some of it on covid. Not sleepy with Xanax.   Sad bc can't function normally.  Usually not depressed. Sleep is fine. Plan:Was Much better with with anxiety, appetite and sleep on mirtazapine 7.5 mg tablet 1 nightly initially.  Nausea, depression and anxiety Are back again. Option increase to 15 mg .  Yes.  10/26/2021 appointment with the following noted: It helped some.  Still some nausea.  CT scan next week.  Off and on nausea without pattern.  Not clearly anxiety but Xanax prn does help.  Frustrating that it is not predictable. Tolerated in crease in mirtazapine 15 with mild hangover.  Still some anxiety first thing in AM but not as bad as it was and is worse without Xanax.  No reason for the anxiety.   Not depressed but discouraged.  Tries to stay active and involved. Sleep is great always with Xanax. Chronic opiates DT back surgery for scoliosis Plan: Continue Xanax 0.25 mg BID for panic and GAD , but option increase TID bc helps nausea.  11/10/21 TC: Pt called reporting CT scan results fine. Requesting as discussed to change Mirtazepine to Olanzapine. MD: As discussed at the last office visit, if her GI work-up did not give a solution to her abdominal problems then it would be reasonable to try olanzapine at low dose.  I have sent in a prescription for 2.5 mg 1 nightly.  That is the lowest dose made.  If she uses a pill splitter she may try cutting it in half.  1/2 tablet may be sufficient for her.  She could experience benefit within the first day or 2 but give it a week or 2 as long as she is tolerating it to be sure. In order to minimize the risk of  daytime sleepiness take the olanzapine 2 to 3 hours before bedtime.  12/15/21 TC: Patient said she had increased the olanzapine and is still c/o headache and has had some dizziness today. She said she did "okay" on the mirtazapine. She said you had her on 1-1/2 tabs of 7.5. She said it probably isn't 100%, but felt she did okay otherwise.   01/11/22 appt noted: No solution to GI px.  Was told to get off hydrocodone but can't DT pain. Olanzapine tried with SE dizziness and HA after a month.  Without much benefit over mirtazapine. Overall ok.  Still some anxiety which doesn't want to eat but doing ok. Mirtazapine 7.5 mg HS helps. Sleep good.   Started seing [employer], good. About the same with mood  with some depression over heallth problems. Sees surgeon in October about pain problems. No panic but awakens with anxiety and general worry.   Less nausea.  02/27/22 TC: Does not appear that she is receiving any benefit from mirtazapine.  Mentioned in my notes that the obvious antidepressant choice would be duloxetine because it will sometimes help with pain as well as depression and anxiety. Stop mirtazapine and start duloxetine 20 mg capsule 1 daily for 1 week then 2 capsules daily. It will take 2 to 4 weeks to get any benefit from the duloxetine.  I would suggest in the interim that she try something that is stronger to help with nausea and anxiety but is likely to be insufficiently helpful for depression, therefore the need for duloxetine. So in addition to the duloxetine start olanzapine at  the lowest dose 2.5 mg tablet 1 nightly which could help with nausea appetite and anxiety almost right away.  If it causes too much sleepiness have her take it 2 to 3 hours before bedtime and she can even cut it in half if she has to. I will send in both prescriptions.  03/06/22 TC:  On 10/16 you wanted pt to take olanzapine and Cymbalta to replace mirtazapine.She did not start the Cymbalta but did take  olanzapine.She had side effects including insomnia,hand tremors,blurred vision and increased back pain.She decided to stop the med.She still wants to try Cymbalta but wants to know if it's ok to take with mirtazapine because she decided to start back taking it.     04/13/22 appointment noted:  Tried olanzapine twice with hand tremor and blurred vision. Back to mirtazapine 15 mg HS.   Less nausea.  Able to gain 6 # but no appetite. Bottomed out in October. Never tried Cymbalta.  Some concern over SE with it. Increased gabapentin to help pain with a little benefit. 300 BID and tolerating. In pain all day.  Tried Nucynta without help.   Mood is better than it was.  Working to accept chronic pain and limited activithy.  04/20/23 TC  Pt said that the cymbalta is making her jittery and more anxious. She has cut the mirtazapine to 7.5mg  not sure if working . Wants something different     MD response:   She is taking the lowest dose of dulxetine and not tolerating it so stop it. I don't have antoher option that will help pain.   If she thinks she is getting some benefit with mirtazapine then continue it.  If not stop it too I would also suggest she come get the [place] genetic test I discussed with her.      06/20/22 appt noted: Mirtazapine helped regain some wt and nausea is better for 6-8 weeks. Currently on 7.5 mg mirtazapine and was up to 15 mg daily. Still some anxiety issues and needs Xanax. Heart burn without Depakote.  It's ok right now.   Peripheral nn stimulator in her back temporary for 2 mos.  No benefit so far.  Chronic pain.   Still seeing Debbie. Not markedly depressed.   Plan: Cont Depakote for reflux.  Depakote ER BRAND helped GI problems which were worse on generic.  But she wants to retry stopping it. Rec continue mirtazapine bc helpful  09/18/22 apt noted: Still taking Depakote ER 250 mg HS.  Reduced mirtazapine to 3.75 mg HS. No N and regained lost wt.  Still some  anxiety managed with mirtazapine.  GI is fine now.  Vocal problems.   Chronic back pain.  Accepting of this.  Still exercise.  Finished therapy with Onalee Hua.  Asks about CBD.  Saw neurologist for Migraine and had problems with the meds RX.  Aimovig has helped..    Past Psychiatric Medication Trials: Failed multiple other antidepressants,  Celexa chest pain Duloxetine SE Nortriptyline hyper Hydroxyzine SE  nefazodone & Depakote for migraine,  Depakote brand ER 250 helps heartburn. Generic failed. Mirtazapine 7.5 mg benefit Olanzapine 2.5 mg HS with SE dizziness and HA citalopram palpitations Trazodone Clonazepam did not help anxiety, alprazolam helps anxiety and sleep  Review of Systems:  Review of Systems  Constitutional:  Positive for appetite change.  Gastrointestinal:  Positive for abdominal pain and nausea.  Musculoskeletal:  Positive for arthralgias and back pain.  Neurological:  Negative for tremors.  Psychiatric/Behavioral:  Positive for  dysphoric mood. Negative for agitation, behavioral problems, confusion, decreased concentration, hallucinations, self-injury, sleep disturbance and suicidal ideas. The patient is nervous/anxious. The patient is not hyperactive.     Medications: I have reviewed the patient's current medications.  Current Outpatient Medications  Medication Sig Dispense Refill   AIMOVIG 140 MG/ML SOAJ INJECT 140 MG INTO SKIN EVERY 30 DAYS 3 mL 3   Ascorbic Acid (VITAMIN C PO) Take 500 mg by mouth daily.     Calcium Carbonate (CALCIUM 600 PO) Take 600-1,200 mg by mouth daily.     cholecalciferol (VITAMIN D3) 25 MCG (1000 UNIT) tablet Take 1,000 Units by mouth daily.     conjugated estrogens (PREMARIN) vaginal cream Place 1 Applicatorful vaginally 2 (two) times a week. 42.5 g 6   famotidine (PEPCID) 40 MG tablet TAKE 1 TABLET BY MOUTH DAILY 30 tablet 3   gabapentin (NEURONTIN) 300 MG capsule Take 300 mg by mouth every evening. 1 tab TID      HYDROcodone-acetaminophen (NORCO) 7.5-325 MG tablet Take 1.5 tablets by mouth every 6 (six) hours as needed for moderate pain.     Lactobacillus (ACIDOPHILUS) 100 MG CAPS Take 100 mg by mouth daily.     lansoprazole (PREVACID) 30 MG capsule Take 30 mg by mouth 2 (two) times daily before a meal.     methocarbamol (ROBAXIN) 750 MG tablet Take 750 mg by mouth 3 (three) times daily.     mirtazapine (REMERON) 7.5 MG tablet Take 3.75 mg by mouth at bedtime.     Multiple Vitamin (MULTIVITAMIN) tablet Take 1 tablet by mouth daily.     ondansetron (ZOFRAN) 4 MG tablet Take 1 tablet (4 mg total) by mouth daily as needed for nausea or vomiting. 60 tablet 3   polyethylene glycol (MIRALAX / GLYCOLAX) 17 g packet Take 17 g by mouth daily.     Probiotic Product (ALIGN) 4 MG CAPS Take 4 mg by mouth daily.     ALPRAZolam (XANAX) 0.25 MG tablet Take 1 tablet (0.25 mg total) by mouth 3 (three) times daily as needed for anxiety. 270 tablet 1   DEPAKOTE ER 250 MG 24 hr tablet Take 1 tablet (250 mg total) by mouth daily. 90 tablet 3   No current facility-administered medications for this visit.    Medication Side Effects: None  Allergies:  Allergies  Allergen Reactions   Penicillins Other (See Comments)    Seizures as a child   Atarax [Hydroxyzine] Other (See Comments)    Headache, depression   Bactrim [Sulfamethoxazole-Trimethoprim] Nausea Only   Celexa [Citalopram Hydrobromide] Other (See Comments)    Chest pain   Erythromycin      Upset stomach   Flagyl [Metronidazole]     Dizzy and increased heart rate   Nortriptyline Itching   Orphenadrine     Hand tremors   Prilosec [Omeprazole]     Abdominal pain   Requip [Ropinirole]     Made sx worse   Darvocet [Propoxyphene N-Acetaminophen] Itching   Percocet [Oxycodone-Acetaminophen] Itching    Past Medical History:  Diagnosis Date   Allergy    Anxiety    Arthritis    Cervical dysplasia    Diverticulitis    Endometriosis    Fatigue     Gallstones    GERD (gastroesophageal reflux disease)    History of hiatal hernia    pt states she currently has a hiatal hernia   IBS (irritable bowel syndrome)    Migraine    Osteopenia    Osteoporosis  01/2019   T score -3.1   Osteoporosis    Plantar fasciitis    PONV (postoperative nausea and vomiting)    also difficult to wake up   Recurrent vaginitis    Reflux    Scoliosis    Wears glasses     Family History  Problem Relation Age of Onset   Cancer Father        lymphoma   Other Mother        bipolar,reflux   Bipolar disorder Mother    Other Brother        sinus problems   Cancer Maternal Aunt        uterine cancer   Breast cancer Maternal Aunt        40's   Diabetes Maternal Aunt    Cancer Paternal Aunt        Colon cancer   Breast cancer Cousin 50       Mat. 1st cousin    Social History   Socioeconomic History   Marital status: Married    Spouse name: Peyton Najjar   Number of children: 0   Years of education: 16   Highest education level: Not on file  Occupational History    Comment: [employer]  Tobacco Use   Smoking status: Former    Types: Cigarettes    Quit date: 05/17/1983    Years since quitting: 39.3   Smokeless tobacco: Never  Vaping Use   Vaping Use: Never used  Substance and Sexual Activity   Alcohol use: Not Currently   Drug use: No   Sexual activity: Not Currently    Birth control/protection: Post-menopausal    Comment: intercourse age 69 , sexual partners more than 5  Other Topics Concern   Not on file  Social History Narrative   Pt is married, no children.  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    Caffeine- very little.   Right handed   Lives at home with her husband   Social Determinants of Health   Financial Resource Strain: Not on file  Food Insecurity: Not on file  Transportation Needs: Not on file  Physical Activity: Not on file  Stress: Not on file  Social Connections: Not on file   Intimate Partner Violence: Not on file    Past Medical History, Surgical history, Social history, and Family history were reviewed and updated as appropriate.   Please see review of systems for further details on the patient's review from today.   Objective:   Physical Exam:  There were no vitals taken for this visit.  Physical Exam Constitutional:      General: She is not in acute distress. Musculoskeletal:        General: No deformity.  Neurological:     Mental Status: She is alert and oriented to person, place, and time.     Cranial Nerves: No dysarthria.     Coordination: Coordination normal.  Psychiatric:        Attention and Perception: Attention and perception normal. She does not perceive auditory or visual hallucinations.        Mood and Affect: Mood is anxious and depressed. Affect is not labile, blunt, tearful or inappropriate.        Speech: Speech normal.        Behavior: Behavior normal. Behavior is cooperative.        Thought Content: Thought content normal. Thought content is not paranoid or delusional. Thought content does not include homicidal  or suicidal ideation. Thought content does not include suicidal plan.        Cognition and Memory: Cognition and memory normal.        Judgment: Judgment normal.     Comments: Insight intact Some residual anxiety chronically       Lab Review:     Component Value Date/Time   NA 140 08/31/2021 1100   NA 134 03/26/2020 1056   K 4.4 08/31/2021 1100   CL 103 08/31/2021 1100   CO2 31 08/31/2021 1100   GLUCOSE 98 08/31/2021 1100   BUN 7 (L) 08/31/2021 1100   BUN 8 03/26/2020 1056   CREATININE 0.73 08/31/2021 1100   CALCIUM 9.4 08/31/2021 1100   PROT 6.9 08/31/2021 1100   ALBUMIN 4.0 08/31/2021 1100   AST 18 08/31/2021 1100   ALT 13 08/31/2021 1100   ALKPHOS 36 (L) 08/31/2021 1100   BILITOT 0.6 08/31/2021 1100   GFRNONAA >60 08/31/2021 1100   GFRAA 91 03/26/2020 1056       Component Value Date/Time   WBC  4.2 08/31/2021 1100   RBC 4.18 08/31/2021 1100   HGB 12.8 08/31/2021 1100   HCT 40.1 08/31/2021 1100   PLT 205 08/31/2021 1100   MCV 95.9 08/31/2021 1100   MCH 30.6 08/31/2021 1100   MCHC 31.9 08/31/2021 1100   RDW 12.4 08/31/2021 1100   LYMPHSABS 0.7 12/21/2019 0949   MONOABS 0.6 12/21/2019 0949   EOSABS 0.0 12/21/2019 0949   BASOSABS 0.0 12/21/2019 0949    Normal liver enzymes in September.  No results found for: "POCLITH", "LITHIUM"   No results found for: "PHENYTOIN", "PHENOBARB", "VALPROATE", "CBMZ"   .res Assessment: Plan:    Anaaya was seen today for follow-up, anxiety and depression.  Diagnoses and all orders for this visit:  Generalized anxiety disorder  Panic disorder without agoraphobia -     ALPRAZolam (XANAX) 0.25 MG tablet; Take 1 tablet (0.25 mg total) by mouth 3 (three) times daily as needed for anxiety.  Recurrent major depression in complete remission (HCC)  Migraine without aura and without status migrainosus, not intractable  Insomnia due to mental condition  Gastroesophageal reflux disease without esophagitis -     DEPAKOTE ER 250 MG 24 hr tablet; Take 1 tablet (250 mg total) by mouth daily.   Greater than 50% of 40 min non face to face time with patient was spent on counseling and coordination of care. We discussed the following. She's worse again with more physical problems  this is affecting mood.  Was Much better with with anxiety, appetite and sleep on mirtazapine 3.75 mg tablet 1 nightly initially.  Nausea, depression and anxiety Are back again.  Yes or she can stop it if desired.  Cont vs consider stopping it.  On 250 mg HS.  Depakote ER BRAND helped GI problems which were worse on generic.  But she wants to retry stopping it.  Supportive therapy dealing with health problems.  Back and stomach pain and problems. Frustrated.    Answered questions about CBD. Option low dose Fetzima 20 for pain.  We discussed the short-term risks  associated with benzodiazepines including sedation and increased fall risk among others.  Discussed long-term side effect risk including dependence, potential withdrawal symptoms, and the potential eventual d  ose-related risk of dementia. Disc newer studies that cast in doubt the relationship with dementia.  Not drowsy with alprazolam. Disc warnings about combo of Bz and opiates not relevant at this low dose. BC morning anxiety  could try switching to Valium and she doesn't want to try it. Continue Xanax 0.25 mg BID for panic and GAD , but option increase TID bc helps nausea.  Consider Genesight testing bc med sensitivity and failures. ACTX partially reviewed but sight is poorly designed and results difficult to see.  Disc this in detail available.  But increase risk SJS with CBZ and oxcarb  FU 3-4 mos  Meredith Staggers, MD, DFAPA  Please see After Visit Summary for patient specific instructions.  Future Appointments  Date Time Provider Department Center  10/12/2022 11:00 AM Genia Del, MD [facility] None  10/24/2022 11:00 AM Lomax, Amy, NP [facility] None  10/31/2022  9:00 AM [facility] DEXA RM 1 [facility] None  12/12/2022 10:00 AM [facility] NURSE [facility] None  04/02/2023 10:20 AM Carlus Pavlov, MD [facility] None    No orders of the defined types were placed in this encounter.     -------------------------------

## 2022-10-02 ENCOUNTER — Ambulatory Visit: Payer: Medicare Other | Admitting: Psychiatry

## 2022-10-05 ENCOUNTER — Ambulatory Visit: Payer: Medicare Other | Admitting: Obstetrics & Gynecology

## 2022-10-12 ENCOUNTER — Encounter: Payer: Self-pay | Admitting: Obstetrics & Gynecology

## 2022-10-12 ENCOUNTER — Ambulatory Visit (INDEPENDENT_AMBULATORY_CARE_PROVIDER_SITE_OTHER): Payer: Medicare Other | Admitting: Obstetrics & Gynecology

## 2022-10-12 VITALS — BP 104/62 | HR 79 | Wt 131.0 lb

## 2022-10-12 DIAGNOSIS — Z9189 Other specified personal risk factors, not elsewhere classified: Secondary | ICD-10-CM | POA: Diagnosis not present

## 2022-10-12 DIAGNOSIS — B009 Herpesviral infection, unspecified: Secondary | ICD-10-CM

## 2022-10-12 DIAGNOSIS — Z01419 Encounter for gynecological examination (general) (routine) without abnormal findings: Secondary | ICD-10-CM

## 2022-10-12 DIAGNOSIS — N958 Other specified menopausal and perimenopausal disorders: Secondary | ICD-10-CM | POA: Diagnosis not present

## 2022-10-12 DIAGNOSIS — Z780 Asymptomatic menopausal state: Secondary | ICD-10-CM | POA: Diagnosis not present

## 2022-10-12 DIAGNOSIS — Z9289 Personal history of other medical treatment: Secondary | ICD-10-CM

## 2022-10-12 DIAGNOSIS — M810 Age-related osteoporosis without current pathological fracture: Secondary | ICD-10-CM

## 2022-10-12 MED ORDER — PREMARIN 0.625 MG/GM VA CREA: 1.0000 | TOPICAL_CREAM | VAGINAL | 6 refills | Status: DC

## 2022-10-12 NOTE — Progress Notes (Signed)
Yolanda Carroll 10-23-1955 [id number]   History:    67 y.o. G0 Married   RP:  Established patient presenting for annual gyn exam    HPI: Postmenopausal, well on no systemic hormone replacement therapy.  No postmenopausal bleeding.  No pelvic pain.  Pap Neg 08/2020.  No h/o abnormal Pap. No indication to repeat a Pap test at this time.  Premarin cream vaginally twice a week.  Vaginal dryness causing pain with IC. Breasts normal.  Mammo 04/2022 Neg.  Body mass index 19.92. Physically active. Bone Density 10/2020 Osteoporosis T-Score -2.8 at the Rt Femoral Neck.  Stable at bilateral hips and significantly improved at the Forearm on Prolia.  BD scheduled for 10/2022. Health labs with family physician. Colonoscopy 2016.   Past medical history,surgical history, family history and social history were all reviewed and documented in the EPIC chart.  Gynecologic History No LMP recorded. Patient is postmenopausal.  Obstetric History OB History  Gravida Para Term Preterm AB Living  0 0 0 0 0 0  SAB IAB Ectopic Multiple Live Births  0 0 0 0 0     ROS: A ROS was performed and pertinent positives and negatives are included in the history. GENERAL: No fevers or chills. HEENT: No change in vision, no earache, sore throat or sinus congestion. NECK: No pain or stiffness. CARDIOVASCULAR: No chest pain or pressure. No palpitations. PULMONARY: No shortness of breath, cough or wheeze. GASTROINTESTINAL: No abdominal pain, nausea, vomiting or diarrhea, melena or bright red blood per rectum. GENITOURINARY: No urinary frequency, urgency, hesitancy or dysuria. MUSCULOSKELETAL: No joint or muscle pain, no back pain, no recent trauma. DERMATOLOGIC: No rash, no itching, no lesions. ENDOCRINE: No polyuria, polydipsia, no heat or cold intolerance. No recent change in weight. HEMATOLOGICAL: No anemia or easy bruising or bleeding. NEUROLOGIC: No headache, seizures, numbness, tingling or weakness. PSYCHIATRIC: No depression,  no loss of interest in normal activity or change in sleep pattern.     Exam:   BP 104/62   Pulse 79   Wt 131 lb (59.4 kg)   SpO2 99%   BMI 19.92 kg/m   Body mass index is 19.92 kg/m.  General appearance : Well developed well nourished female. No acute distress HEENT: Eyes: no retinal hemorrhage or exudates,  Neck supple, trachea midline, no carotid bruits, no thyroidmegaly Lungs: Clear to auscultation, no rhonchi or wheezes, or rib retractions  Heart: Regular rate and rhythm, no murmurs or gallops Breast:Examined in sitting and supine position were symmetrical in appearance, no palpable masses or tenderness,  no skin retraction, no nipple inversion, no nipple discharge, no skin discoloration, no axillary or supraclavicular lymphadenopathy Abdomen: no palpable masses or tenderness, no rebound or guarding Extremities: no edema or skin discoloration or tenderness  Pelvic: Vulva: Normal             Vagina: No gross lesions or discharge  Cervix: No gross lesions or discharge  Uterus  AV, normal size, shape and consistency, non-tender and mobile  Adnexa  Without masses or tenderness  Anus: Normal   Assessment/Plan:  67 y.o. female for annual exam   1. Well female exam with routine gynecological exam Postmenopausal, well on no systemic hormone replacement therapy. No postmenopausal bleeding.  No pelvic pain.  Pap Neg 08/2020.  No h/o abnormal Pap. No indication to repeat a Pap test at this time.  Premarin cream vaginally twice a week.  Vaginal dryness causing pain with IC. Breasts normal.  Mammo 04/2022 Neg.  Body  mass index 19.92. Physically active. Bone Density 10/2020 Osteoporosis T-Score -2.8 at the Rt Femoral Neck.  Stable at bilateral hips and significantly improved at the Forearm on Prolia.  BD scheduled for 10/2022. Health labs with family physician. Colonoscopy 2016.  2. Postmenopause Postmenopausal, well on no systemic hormone replacement therapy. No postmenopausal bleeding.  No  pelvic pain.    3. Genitourinary syndrome of menopause Premarin cream vaginally twice a week.  Vaginal dryness causing pain with IC. Intra-Rosa discussed, decision to continue on the Premarin Cream at this time and add Replens.  4. Osteoporosis without current pathological fracture, unspecified osteoporosis type Bone Density 10/2020 Osteoporosis T-Score -2.8 at the Rt Femoral Neck.  Stable at bilateral hips and significantly improved at the Forearm on Prolia.  BD scheduled for 10/2022.  5. Personal history of other medical treatment  6. Other specified personal risk factors, not elsewhere classified  Other orders - denosumab (PROLIA) 60 MG/ML SOSY injection - PREVIDENT 5000 SENSITIVE 1.1-5 % GEL; Place onto teeth. - conjugated estrogens (PREMARIN) vaginal cream; Place 1 Applicatorful vaginally 2 (two) times a week.   Genia Del MD, 11:19 AM

## 2022-10-15 NOTE — Telephone Encounter (Signed)
Prolia VOB initiated via [email or web address]  Last OV: 03/31/22 Next OV: 04/02/23 Last Prolia inj: 06/09/22 Next Prolia inj DUE: 12/09/22

## 2022-10-19 ENCOUNTER — Encounter: Payer: Self-pay | Admitting: Obstetrics & Gynecology

## 2022-10-24 ENCOUNTER — Encounter: Payer: Self-pay | Admitting: Family Medicine

## 2022-10-24 ENCOUNTER — Ambulatory Visit (INDEPENDENT_AMBULATORY_CARE_PROVIDER_SITE_OTHER): Payer: Medicare Other | Admitting: Family Medicine

## 2022-10-24 VITALS — BP 109/50 | HR 61 | Ht 67.0 in | Wt 134.5 lb

## 2022-10-24 DIAGNOSIS — G43001 Migraine without aura, not intractable, with status migrainosus: Secondary | ICD-10-CM | POA: Diagnosis not present

## 2022-10-24 NOTE — Progress Notes (Signed)
Chief Complaint  Patient presents with   Follow-up    Pt in room 2. Here for migraine follow up. Pt reports doing well, last migraine was in July 2023. Pt recently had eye surgery.    HISTORY OF PRESENT ILLNESS:  10/24/22 ALL:  Yolanda Carroll returns for follow up for migraines. She continues Amovig and Nurtec. She reports doing very well. She has not had any headaches since 11/2021. She has not needed Nurtec. She stays well hydrated. BP normally on lower side. She is asymptomatic. She recently had cataract surgery and is recovering well. She is feeling well and without concerns.   10/20/2021 ALL: Yolanda Carroll returns for follow up for migraines. She continues Amovig (PAP) and Nurtec. She reports doing very well. She has not had a migraine since 03/2021. She has not yet taken Nurtec but does have some just in case. She is having more nausea following cholecystectomy. She continues to recover from back surgery last July. She is exercising regularly. She feels mood is ok. She is disappointed that it taking her longer to heal. She is able to drive. She is followed closely by care team.   12/21/20 ALL (Mychart): Yolanda Carroll is a 67 y.o. female here today for follow up. She continues Amovig but reports that copay is too expensive. She has failed Ajovy. Discussed trial of Emgality at last visit, however, unsure if this will be affordable. She is doing very well on Amovig. She is not having any headache days. She has Nurtec but has not needed to take it.   She had posterior T4-ilium fusion on 7/19. She was discharged from rehab 12/17/2020. She is doing very well. She is doing home exercises and walking daily.   Meds tried: Amovig (on now), Depakote, Imitrex, baclofen, Nortriptyline, Gabapentin, Nefazodone, Skelaxin, ketaprofen  10/20/2020 ALL:  Yolanda Carroll is a 67 y.o. female here today for follow up for migraines. She continues Amovig and Nurtec. She reports that headaches have resolved. She has not had  to take Nurtec. She can't remember when she last had a migraine. She tried to wean divalproex but had severe acid reflux. Psychiatry resumed divalproex. She is also taking Carafate. Since being diagnosed with Covid in 12/2019, she has had significant anxiety. She is being treated with alprazolam (dose was increased) and started on mirtazapine.   HISTORY (copied from Dr Trevor Mace previous note)  Interval history 08/05/2019:  She is still doing well on Aimovig. She came off of the nefazodone. She came off the depakote and her migraines are stable but she was having other symptoms such as swallowing problems and maybe withdrawal symptoms, she is on pepcid now, she doesn't want to go back on them. She continues the xanax at bedtime and wants to stop that as well. Bernita Raisin did not work. She has not had many headaches, she still has meclizine and she has taken that that aspirin and ibuprofen for mild headaches. She has not had any severe headaches this year, mild and responded to the meclizine combo above. Gave her samples of Nurtec.   Interval history 08/01/2018: She went back to Aimovig because it worked better. Ajovy didn't work, it wore off.  The aimovig gave her constipation but she is working with GI and feels better. Discussed other acute medications, Bernita Raisin and others. Has had a difficult time with acute management. She feels tremendously improved.  In the past we have tried her on a combination of meds for acute management, she is tried multiple of  them Cambia, Zofran, Relpax and other triptans, baclofen, tramadol, Fioricet, zembrace and Zomig and other triptan's.  Today we discussed some of the new medications such as Ubrogrepant and Lasmitidan.  I gave her samples of Ubrelvy today and she will get back to [place].   Interval History 01/29/2018; Tried her on a combination of meds for acute management (cambia, zofran, relpax) was also taking imitrex and baclofen. Tramadol in hte past did not help. Tried Fioricet at  last appointment, made her dizzy and made her itch. Discussed trying other medications. Discussed oral medications. She has them worse in the winter, they can last up to 7 days. Discussed trying Zembrace and Zomig nasal acutely and also starting Aimovig for the next 3 months as she has worsening migraines in winter will provide samples and see how she does.   Interval history 03/12/2017: Tried a combination of medications, cambia, relpax and zofran. Cambia alone did not help. Zofran and relpax did not help when she got home. Took all three together later in the mirgaine didn;t help. But also tried it at onset of headache and did not stop the migraine. She had her last migraine at the end of August until the 17th of September. Then October 24th had another headache and the tylenol worked. Lasted a few days, baclofen for neck pain but did not stop it until the 26th-28th. 63 days has had 41 headache free days. In 2 months had 22 migraine days. Season may also trigger. She has tried imitrex PO. Tramadol shot in the past had not helped, migraine started on 6/27 and imitrex did not help but she had the headache for several days so this was not at onset. Tried cambia.    HPI:  Yolanda Carroll is a 67 y.o. female here as a referral from Dr. Paulino Rily for migraines. She is currently on Depakote, meloxicam, Imitrex, baclofen. She has a past medical history of osteoporosis, plantar fasciitis, migraine, irritable bowel syndrome, fatigue, neck and back pain with degenerative disc disease and radiculopathy, arthritis. She has had migraines since 2000. Started worsening in October. They can last up to 2 weeks straight. She has associated vertigo. She has done well on nefazodone and depakote. The next headache was in June and lasted 2 weeks. It hurts behind the eyes, she can still function but is moderate in pain, weather triggers, she has light sensitivity, no significant nausea or vomiting. She has had nausea and vomiting in  the past. Depakote is working for her. Slowly progressive when they start. No medication overuse. No other focal neurologic deficits, associated symptoms, inciting events or modifiable factors.   Meds tried: Depakote, Imitrex, baclofen, Nortriptyline, Gabapentin, Nefazodone, Skelaxin, ketaprofen,    Reviewed notes, labs and imaging from outside physicians, which showe:    Personally reviewed imaging and agree with following   MRI cervical spine 07/2013: 1. Mild worsening of the foraminal impingement at C7-T1 due to progressive spondylosis and degenerative disc disease. 2. Mild left foraminal impingement at C3-4, C5-6, and C6-7 primarily due to spurring. Although there is some residual left foraminal impingement at the postoperative levels, the degree of impingement at these levels is much less than on the preoperative exam.    REVIEW OF SYSTEMS: Out of a complete 14 system review of symptoms, the patient complains only of the following symptoms, acid reflux, anxiety, back pain, and all other reviewed systems are negative.   ALLERGIES: Allergies  Allergen Reactions   Penicillins Other (See Comments)    Seizures as  a child   Atarax [Hydroxyzine] Other (See Comments)    Headache, depression   Bactrim [Sulfamethoxazole-Trimethoprim] Nausea Only   Celexa [Citalopram Hydrobromide] Other (See Comments)    Chest pain   Erythromycin      Upset stomach   Flagyl [Metronidazole]     Dizzy and increased heart rate   Nortriptyline Itching   Orphenadrine     Hand tremors   Prilosec [Omeprazole]     Abdominal pain   Requip [Ropinirole]     Made sx worse   Darvocet [Propoxyphene N-Acetaminophen] Itching   Percocet [Oxycodone-Acetaminophen] Itching     HOME MEDICATIONS: Outpatient Medications Prior to Visit  Medication Sig Dispense Refill   AIMOVIG 140 MG/ML SOAJ INJECT 140 MG INTO SKIN EVERY 30 DAYS 3 mL 3   ALPRAZolam (XANAX) 0.25 MG tablet Take 1 tablet (0.25 mg total) by mouth 3  (three) times daily as needed for anxiety. 270 tablet 1   Ascorbic Acid (VITAMIN C PO) Take 500 mg by mouth daily.     Calcium Carbonate (CALCIUM 600 PO) Take 600-1,200 mg by mouth daily.     cholecalciferol (VITAMIN D3) 25 MCG (1000 UNIT) tablet Take 1,000 Units by mouth daily.     conjugated estrogens (PREMARIN) vaginal cream Place 1 Applicatorful vaginally 2 (two) times a week. 42.5 g 6   denosumab (PROLIA) 60 MG/ML SOSY injection      DEPAKOTE ER 250 MG 24 hr tablet Take 1 tablet (250 mg total) by mouth daily. 90 tablet 3   famotidine (PEPCID) 40 MG tablet TAKE 1 TABLET BY MOUTH DAILY 30 tablet 3   gabapentin (NEURONTIN) 300 MG capsule Take 300 mg by mouth every evening. 1 tab TID     HYDROcodone-acetaminophen (NORCO) 7.5-325 MG tablet Take 1.5 tablets by mouth every 6 (six) hours as needed for moderate pain.     Lactobacillus (ACIDOPHILUS) 100 MG CAPS Take 100 mg by mouth daily.     methocarbamol (ROBAXIN) 750 MG tablet Take 750 mg by mouth 3 (three) times daily.     mirtazapine (REMERON) 7.5 MG tablet Take 3.75 mg by mouth at bedtime.     Multiple Vitamin (MULTIVITAMIN) tablet Take 1 tablet by mouth daily.     ondansetron (ZOFRAN) 4 MG tablet Take 1 tablet (4 mg total) by mouth daily as needed for nausea or vomiting. 60 tablet 3   polyethylene glycol (MIRALAX / GLYCOLAX) 17 g packet Take 17 g by mouth daily.     PREVIDENT 5000 SENSITIVE 1.1-5 % GEL Place onto teeth.     No facility-administered medications prior to visit.     PAST MEDICAL HISTORY: Past Medical History:  Diagnosis Date   Allergy    Anxiety    Arthritis    Cervical dysplasia    Diverticulitis    Endometriosis    Fatigue    Gallstones    GERD (gastroesophageal reflux disease)    History of hiatal hernia    pt states she currently has a hiatal hernia   HSV infection    oral   IBS (irritable bowel syndrome)    Migraine    Osteopenia    Osteoporosis 01/2019   T score -3.1   Osteoporosis    Plantar  fasciitis    PONV (postoperative nausea and vomiting)    also difficult to wake up   Recurrent vaginitis    Reflux    Scoliosis    Wears glasses      PAST SURGICAL HISTORY: Past Surgical History:  Procedure Laterality Date   ANTERIOR CERVICAL DECOMP/DISCECTOMY FUSION  04/17/2011   Procedure: ANTERIOR CERVICAL DECOMPRESSION/DISCECTOMY FUSION 2 LEVELS;  Surgeon: Hewitt Shorts;  Location: [facility];  Service: Neurosurgery;  Laterality: N/A;  Cervical five-six, six-seven anterior cervical decompression with fusion,  plating,  and bonegraft    APPENDECTOMY  1978   BACK SURGERY N/A    2022   BREAST BIOPSY  07/13/2011   Procedure: BREAST BIOPSY WITH NEEDLE LOCALIZATION;  Surgeon: Emelia Loron, MD;  Location: [facility];  Service: General;  Laterality: Right;  Right breast wire localization biopsy   BREAST EXCISIONAL BIOPSY Right 2013   BREAST SURGERY     Breast Bx-Benign   CHOLECYSTECTOMY N/A 09/08/2021   Procedure: LAPAROSCOPIC CHOLECYSTECTOMY;  Surgeon: Harriette Bouillon, MD;  Location: [facility];  Service: General;  Laterality: N/A;   CYSTOSCOPY     GYNECOLOGIC CRYOSURGERY     HERNIA REPAIR  08/02/1995   RIH   LAPAROSCOPIC ENDOMETRIOSIS FULGURATION  1997   PELVIC LAPAROSCOPY     ROTATOR CUFF REPAIR     right 2002 left 2000     FAMILY HISTORY: Family History  Problem Relation Age of Onset   Cancer Father        lymphoma   Other Mother        bipolar,reflux   Bipolar disorder Mother    Other Brother        sinus problems   Cancer Maternal Aunt        uterine cancer   Breast cancer Maternal Aunt        40's   Diabetes Maternal Aunt    Cancer Paternal Aunt        Colon cancer   Breast cancer Cousin 50       Mat. 1st cousin     SOCIAL HISTORY: Social History   Socioeconomic History   Marital status: Married    Spouse name: Peyton Najjar   Number of children: 0   Years of education: 16   Highest education level: Not on file  Occupational History     Comment: [employer]  Tobacco Use   Smoking status: Former    Types: Cigarettes    Quit date: 05/17/1983    Years since quitting: 39.4   Smokeless tobacco: Never  Vaping Use   Vaping Use: Never used  Substance and Sexual Activity   Alcohol use: Not Currently   Drug use: No   Sexual activity: Not Currently    Birth control/protection: Post-menopausal    Comment: intercourse age 71 , sexual partners more than 5  Other Topics Concern   Not on file  Social History Narrative   Pt is married, no children.  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    Caffeine- very little.   Right handed   Lives at home with her husband   Social Determinants of Health   Financial Resource Strain: Not on file  Food Insecurity: Not on file  Transportation Needs: Not on file  Physical Activity: Not on file  Stress: Not on file  Social Connections: Not on file  Intimate Partner Violence: Not on file      PHYSICAL EXAM  Vitals:   10/24/22 1050  BP: (!) 109/50  Pulse: 61  Weight: 134 lb 8 oz (61 kg)  Height: 5\' 7"  (1.702 m)     Body mass index is 21.07 kg/m.   Generalized: Well developed, in no acute distress  Cardiology: normal rate and  rhythm, no murmur auscultated  Respiratory: clear to auscultation bilaterally    Neurological examination  Mentation: Alert oriented to time, place, history taking. Follows all commands speech and language fluent Cranial nerve II-XII: Facial sensation and strength were normal. Head turning and shoulder shrug  were normal and symmetric. Motor: The motor testing reveals 5 over 5 strength of all 4 extremities. Good symmetric motor tone is noted throughout.  Gait and station: Gait is normal.     DIAGNOSTIC DATA (LABS, IMAGING, TESTING) - I reviewed patient records, labs, notes, testing and imaging myself where available.  Lab Results  Component Value Date   WBC 4.2 08/31/2021   HGB 12.8 08/31/2021   HCT 40.1  08/31/2021   MCV 95.9 08/31/2021   PLT 205 08/31/2021      Component Value Date/Time   NA 140 08/31/2021 1100   NA 134 03/26/2020 1056   K 4.4 08/31/2021 1100   CL 103 08/31/2021 1100   CO2 31 08/31/2021 1100   GLUCOSE 98 08/31/2021 1100   BUN 7 (L) 08/31/2021 1100   BUN 8 03/26/2020 1056   CREATININE 0.73 08/31/2021 1100   CALCIUM 9.4 08/31/2021 1100   PROT 6.9 08/31/2021 1100   ALBUMIN 4.0 08/31/2021 1100   AST 18 08/31/2021 1100   ALT 13 08/31/2021 1100   ALKPHOS 36 (L) 08/31/2021 1100   BILITOT 0.6 08/31/2021 1100   GFRNONAA >60 08/31/2021 1100   GFRAA 91 03/26/2020 1056   No results found for: "CHOL", "HDL", "LDLCALC", "LDLDIRECT", "TRIG", "CHOLHDL" No results found for: "HGBA1C" No results found for: "VITAMINB12" No results found for: "TSH"      No data to display               No data to display           ASSESSMENT AND PLAN  67 y.o. year old female  has a past medical history of Allergy, Anxiety, Arthritis, Cervical dysplasia, Diverticulitis, Endometriosis, Fatigue, Gallstones, GERD (gastroesophageal reflux disease), History of hiatal hernia, HSV infection, IBS (irritable bowel syndrome), Migraine, Osteopenia, Osteoporosis (01/2019), Osteoporosis, Plantar fasciitis, PONV (postoperative nausea and vomiting), Recurrent vaginitis, Reflux, Scoliosis, and Wears glasses. here with    Migraine without aura and with status migrainosus, not intractable  Yolanda Carroll is doing very well from a headache standpoint. We will continue Amovig and Nurtec. She is receiving Amovig through PAP and will call when updated rx is needed. She will continue to work with care team for back pain, anxiety and GERD. Healthy lifestyle habits encouraged. She will follow up with me in 1 year, sooner if needed.   No orders of the defined types were placed in this encounter.    No orders of the defined types were placed in this encounter.     Shawnie Dapper, MSN, FNP-C 10/24/2022, 11:14  AM  [facility] [address] [place], [place] [address] [phone]

## 2022-10-24 NOTE — Patient Instructions (Signed)
Below is our plan:  We will continue Amovig and Nurtec   Please make sure you are staying well hydrated. I recommend 50-60 ounces daily. Well balanced diet and regular exercise encouraged. Consistent sleep schedule with 6-8 hours recommended.   Please continue follow up with care team as directed.   Follow up with me in 1 year  You may receive a survey regarding today's visit. I encourage you to leave honest feed back as I do use this information to improve patient care. Thank you for seeing me today!   To prevent or relieve headaches, try the following:  Cool Compress. Lie down and place a cool compress on your head.  Avoid headache triggers. If certain foods or odors seem to have triggered your migraines in the past, avoid them. A headache diary might help you identify triggers.  Include physical activity in your daily routine. Try a daily walk or other moderate aerobic exercise.  Manage stress. Find healthy ways to cope with the stressors, such as delegating tasks on your to-do list.  Practice relaxation techniques. Try deep breathing, yoga, massage and visualization.  Eat regularly. Eating regularly scheduled meals and maintaining a healthy diet might help prevent headaches. Also, drink plenty of fluids.  Follow a regular sleep schedule. Sleep deprivation might contribute to headaches Consider biofeedback. With this mind-body technique, you learn to control certain bodily functions -- such as muscle tension, heart rate and blood pressure -- to prevent headaches or reduce headache pain.      Proceed to emergency room if you experience new or worsening symptoms or symptoms do not resolve, if you have new neurologic symptoms or if headache is severe, or for any concerning symptom.

## 2022-10-30 ENCOUNTER — Ambulatory Visit: Payer: PRIVATE HEALTH INSURANCE | Admitting: Internal Medicine

## 2022-10-31 ENCOUNTER — Other Ambulatory Visit: Payer: Self-pay | Admitting: Obstetrics & Gynecology

## 2022-10-31 ENCOUNTER — Ambulatory Visit (INDEPENDENT_AMBULATORY_CARE_PROVIDER_SITE_OTHER): Payer: Medicare Other

## 2022-10-31 DIAGNOSIS — M810 Age-related osteoporosis without current pathological fracture: Secondary | ICD-10-CM

## 2022-10-31 DIAGNOSIS — Z780 Asymptomatic menopausal state: Secondary | ICD-10-CM

## 2022-10-31 DIAGNOSIS — Z13820 Encounter for screening for osteoporosis: Secondary | ICD-10-CM | POA: Diagnosis not present

## 2022-11-01 ENCOUNTER — Encounter: Payer: Self-pay | Admitting: Family Medicine

## 2022-11-01 NOTE — Telephone Encounter (Addendum)
Pt ready for scheduling on or after 12/09/22  Out-of-pocket cost due at time of visit: $0  Primary: Medicare Prolia co-insurance: 20% (approximately $302) Admin fee co-insurance: 20% (approximately $25)  Deductible: $240 of $240 met  Secondary: [employer] Medicare Supp Prolia co-insurance: Covers Medicare Part B co-insurance Admin fee co-insurance: Covers Medicare Part B co-insurance  Deductible: does NOT cover Medicare deductible, which has been met  Prior Auth: NOT required PA# Valid:   ** This summary of benefits is an estimation of the patient's out-of-pocket cost. Exact cost may vary based on individual plan coverage.

## 2022-11-06 HISTORY — PX: CATARACT EXTRACTION: SUR2

## 2022-12-07 ENCOUNTER — Other Ambulatory Visit: Payer: Self-pay | Admitting: Gastroenterology

## 2022-12-12 ENCOUNTER — Ambulatory Visit (INDEPENDENT_AMBULATORY_CARE_PROVIDER_SITE_OTHER): Payer: Medicare Other

## 2022-12-12 VITALS — BP 118/70 | HR 56 | Wt 134.8 lb

## 2022-12-12 DIAGNOSIS — M810 Age-related osteoporosis without current pathological fracture: Secondary | ICD-10-CM

## 2022-12-12 MED ORDER — DENOSUMAB 60 MG/ML ~~LOC~~ SOSY
60.0000 mg | PREFILLED_SYRINGE | Freq: Once | SUBCUTANEOUS | Status: AC
Start: 2022-12-12 — End: 2022-12-12
  Administered 2022-12-12: 60 mg via SUBCUTANEOUS

## 2022-12-12 NOTE — Progress Notes (Signed)
After obtaining consent, and per orders of Dr. Elvera Lennox, injection of Prolia 60 mg given by Pollie Meyer. Patient instructed to remain in clinic for 20 minutes afterwards, and to report any adverse reaction to me immediately.

## 2022-12-14 NOTE — Telephone Encounter (Signed)
Last Prolia inj 12/12/22 Next Prolia inj due 06/15/23

## 2023-01-08 ENCOUNTER — Ambulatory Visit: Payer: Medicare Other | Admitting: Psychiatry

## 2023-02-14 ENCOUNTER — Ambulatory Visit (INDEPENDENT_AMBULATORY_CARE_PROVIDER_SITE_OTHER): Payer: Medicare Other | Admitting: Psychiatry

## 2023-02-14 ENCOUNTER — Encounter: Payer: Self-pay | Admitting: Psychiatry

## 2023-02-14 DIAGNOSIS — F410 Panic disorder [episodic paroxysmal anxiety] without agoraphobia: Secondary | ICD-10-CM

## 2023-02-14 DIAGNOSIS — G43009 Migraine without aura, not intractable, without status migrainosus: Secondary | ICD-10-CM

## 2023-02-14 DIAGNOSIS — K219 Gastro-esophageal reflux disease without esophagitis: Secondary | ICD-10-CM

## 2023-02-14 DIAGNOSIS — F411 Generalized anxiety disorder: Secondary | ICD-10-CM | POA: Diagnosis not present

## 2023-02-14 DIAGNOSIS — F3342 Major depressive disorder, recurrent, in full remission: Secondary | ICD-10-CM

## 2023-02-14 DIAGNOSIS — F5105 Insomnia due to other mental disorder: Secondary | ICD-10-CM

## 2023-02-14 MED ORDER — MIRTAZAPINE 7.5 MG PO TABS
3.7500 mg | ORAL_TABLET | Freq: Every day | ORAL | 1 refills | Status: DC
Start: 2023-02-14 — End: 2023-08-20

## 2023-02-14 MED ORDER — ALPRAZOLAM 0.25 MG PO TABS
0.2500 mg | ORAL_TABLET | Freq: Three times a day (TID) | ORAL | 1 refills | Status: DC | PRN
Start: 2023-02-14 — End: 2023-12-17

## 2023-02-14 NOTE — Progress Notes (Signed)
Yolanda Carroll [id number] 07-11-1955 67 y.o.   Subjective:   Patient ID:  Yolanda Carroll is a 67 y.o. (DOB 12-10-1955) female.  Chief Complaint:  Chief Complaint  Patient presents with   Follow-up    Mood, meds, sleep, anxiety    Anxiety Symptoms include nervous/anxious behavior. Patient reports no confusion, decreased concentration, nausea or suicidal ideas.    Depression        Associated symptoms include appetite change.  Associated symptoms include no decreased concentration and no suicidal ideas.  Past medical history includes anxiety.    ANAHIT Carroll presents to the office today for follow-up of anxiety and migraine.  seen in July 2020.  No meds were changed.  She requires name brand Depakote because the generic caused worsening headaches.  Retired November 14, 2018.  Was so burned out at work.  Helping with church projects and helping friends.  Focusing on her health and doing PT for back and hip.  No kids or gkids.  Helps care for 50 yo M-in-law.  H is also busy which helps.  Given a surprise retirement party.     seen January 2021.  Nefazodone is no longer manufactured and she had to wean off.  We also discussed the potential of weaning off Depakote because Aimovig had been very effective at managing her migraine headache.  She called August 11, 2019 stating that she did taper off of Depakote because she did not feel like she needed it any more.  Her last dose was July 23, 2019 but afterwards noticed heartburn and swallowing issues.  Her GI doctor added Pepcid 40 mg daily.  Her neurologist had indicated that Depakote can sometimes help with esophageal issues and she wondered if that was connected.  She was given the option to restart a lower dose and perhaps taper more slowly.  Off Depakote for a month.  She elected not to restart it.  08/19/2019 appointment the following is noted: Tinnitus and mild anxiety worse off the Depakote and with some mind racing esp in the AM.    Most is better. Reflux is worse off Depakote ER 250 and wondering if she should restart it.   Disc article on Depakote helping GERD by increasing lower esophageal sphincter tone. Has travelled some with family events.  M-in-law 92 soon.   HA good with Aimovig. Don't do season changes well and more HA in Spring often. Sleep not great lately with leg cramps.  Started more exercise.  Started snoring and H is a light sleeper.  Meds help. Plan no med changes.  10/03/2019 phone call with patient asking to restart residual nefazodone that she has on hand.  She felt that it helped back pain and she is experiencing more back pain recently.  She is aware that it is no longer manufactured to our knowledge and once she runs out it will not be available.  She indicated she had an off to take half of 150 mg nefazodone tablet for about 2 to 3 months and would like to do so.  11/05/2019 appointment with the following noted:  Appt moved earlier DT the following. Had stopped Depakote and nefazadone and reflux got worse.  Restarting Depakote didn't help much.  Restarted nefazodaone and reflux better right away.  May not need Depakote but doesn't want to change.  Can't exercise DT back pain since Jan ans worse since March.  Getting new doctor.  More depressed and anxious and sleep problems DT back pain.  Usually only 0.25 mg alprazolam at night. Plan: has to stop nefazodone when it runs out bc no longer manufactured.  Therefore will try trazodone in it's place.  01/07/20 appt with the following noted: She and H both got Covid.  Was pretty sick for 10 days.  Lost taste.  Had little resp stuff but severe diarrhea and nausea and couldn't keep fluids down.  No residual sx. H had resp sx with pneumonia and had to get O2.  Hosp for 3 days. Still have some nefazodone and nursing it along. Tried trazodone 50 HS for 2 nights and didn't sleep well and had a HA  Nefazodone and Depakote both helped the GERD.  She has enough  nefazadone for about 6 weeks. Plan: Nefazodone helped the reflux and she wants to take it as long as possible.  She didn't have withdrawal off nefazodone. When she runs out then start trazodone.  If that fails then use Viibryd.  Disc SE and alternatives.   Rec try trazodone again but use 100 mg and see if sleeping better prevents the AM HA.  If HA recurs then viibryd. Starter kit.  Depakote didn't help as much as expected and she might stop it.   02/18/2020 phone call: Najma called to report that she is experiencing high anxiety.  It is causing chest discomfort and breathing problems. Has appt 11/18 and is on the wait list but needs to discuss how to relief this anxiety.  Please call. Response: Note Rtc to patient, she does have the Viibryd and does remember that discussion. She has not been able to start that because she received a steroid injection a couple weeks ago and caused her stomach to be upset. She didn't want to start it knowing that's a side effect. She feels like the steroid injection may have worsened all her symptoms. She did try the trazodone but it was too much for her, even taking a 1/2 tablet. She does have some serzone left and has been taking that until this improves. Also taking Xanax as needed during day if needed. Hoping all of this will improve soon. Informed her I would update Dr. Jennelle Human with her information.        04/01/2020 appointment with the following noted: Out of nefazodone a few weeks ago.  More reflux off it.  Tried trazodone a couple of times.  Tried 100 mg trazodone and heart skipped.  50 mg awoke with chest tightness.  Long haul Covid with GI sx.  Nausea and anxiety problems daily since mid September.  Covid early August and got monoclonal infusion which helped.  Headed to long haul Covid clinic in [place] Monday.   Needed to increase alprazolam to BID.  And 80% better with that.  Panic some out of bed.  Sx started after steroid shot for her back 9/21.   Hasn't tried  Viibryd DT fear of GI worsening.  Poor appetite and lost 15#. Not taken tramadol yet. Migraine under control. Plan: no med changes  06/01/20 appt with following noted: Starting generic Depakote ER generic today. Started Mirtazapine 3.75 mg and it worked right away for nausea for a week.  Then increased to 7.5 mg HS.  Most days is feeling really well.  No SE except mild drowsiness. Still has post Covid problems with stomach but much better.  Regained 7# of needed weight.  Need to try to gain 10# more. Zofran failed. Had a lot anxiety with N but it's better now.  Was waking with  panic and no longer N and anxiety better too. Severe scoliosis facing big surgery in summer with long recovery.  Can't ride in car for long. Plan: No med changes  08/18/2020 appointment with the following noted: Gained 12# and thankful for mirtazapine. Long Covid GI sx.  Better not gone with mirtazapine. Rare AM panic.  Sleep good. Scoliosis and may require surgery. Can't sit or stand for long Depakote brand ER 250 helps heartburn. Generic failed. Patient denies difficulty with sleep initiation with Xanax.. Denies appetite disturbance.  Patient reports that energy and motivation have been good.  Patient denies any difficulty with concentration.  Patient denies any suicidal ideation. More depressed and tearful with pain but varies with pain levels.  Good and bad days.  Not too depressed with anxiety worse. Plan: Continue mirtazapine 7.5 mg with Xanax for GI problems including nausea and appetite.  It has worked some.  Try higher dose. Insomnia managed with  With alprazolam.    11/18/2020 appointment with the following noted: Increased mirtazapine to 7.5 mg 1 and 1/2 tablet bc  less anxious with it. Facing big back surgery for severe scoliosis 11/30/20.  Can't drive in car for distance or travel. Now on Medicare. Depakote ER BRAND helped GI problems which were worse on generic. Not markedly depressed.  Patient reports  stable mood and denies depressed or irritable moods.   Patient denies difficulty with sleep initiation or maintenance. Denies appetite disturbance.  Patient reports that energy and motivation have been good.  Patient denies any difficulty with concentration.  Patient denies any suicidal ideation. Plan: No med changes Continue Depakote brand ER 250 mg daily, continue alprazolam 0.25 mg nightly, continue mirtazapine 7.5 mg nightly  04/26/2021 appointment with the following noted: Still on hydrocodone from surgery on back in July.  Driving.  Healing slowly. Pain before surgery is gone now.  Now pain related to adjusting to rod. But surgery was successful. Peyton Najjar has been helpful. Has reduced mirtazapine 3.75 mg HS to try to come off it.  Sleep a little worse if doesn't take it.  Stomach problems are better.   No HA in a year but started having migraines in a year.  Migraines started after change in logo of brand Depakote so restarted Aimovig.  Depakote also helped acid reflux. Mirtazapine helped sleep and appetite.  Weight is good.  Sleep is good with it still. Mood is OK. Active at church and people are supportive. Continue Depakote brand ER 250 mg daily, continue alprazolam 0.25 mg nightly, continue mirtazapine 7.5 mg nightly  08/03/2021 appointment the following noted: Not doing so good.  Was doing great after surgery. A few weeks ago the nausea and anxiety came back. Had reduced mirtazapine to 3.75 mg HS, back up to 7.5 mg HS about 2 weeks.  No effects so far. Mad and depressed and not me.  Nausea coming back has been upsetting. Pursuing GI work up. Awakens with anxiety.   No sleep trouble. Conc worse. Plan:Was Much better with with anxiety, appetite and sleep on mirtazapine 7.5 mg tablet 1 nightly and relapsed when decreased to 1/tablet nightly,  so increase to 1 &  1/2 of the 7.5 mg tablets HS .   09/27/2021 appointment with the following noted: Last few weeks tough. Cholecystectomy  09/08/21 DT chronic nausea but not better.  Yesterday no nausea for first time in months. Losing weight on bland diet. But it stopped. Chronic back pain after surgery. SE mirtazapine dry mouth. Usually feels ok in AM  but afternoons gets nausea.   Xanax does help afternoon nausea so maybe it's anxiety. Exhausted from all of this.  Still blames some of it on covid. Not sleepy with Xanax.   Sad bc can't function normally.  Usually not depressed. Sleep is fine. Plan:Was Much better with with anxiety, appetite and sleep on mirtazapine 7.5 mg tablet 1 nightly initially.  Nausea, depression and anxiety Are back again. Option increase to 15 mg .  Yes.  10/26/2021 appointment with the following noted: It helped some.  Still some nausea.  CT scan next week.  Off and on nausea without pattern.  Not clearly anxiety but Xanax prn does help.  Frustrating that it is not predictable. Tolerated in crease in mirtazapine 15 with mild hangover.  Still some anxiety first thing in AM but not as bad as it was and is worse without Xanax.  No reason for the anxiety.   Not depressed but discouraged.  Tries to stay active and involved. Sleep is great always with Xanax. Chronic opiates DT back surgery for scoliosis Plan: Continue Xanax 0.25 mg BID for panic and GAD , but option increase TID bc helps nausea.  11/10/21 TC: Pt called reporting CT scan results fine. Requesting as discussed to change Mirtazepine to Olanzapine. MD: As discussed at the last office visit, if her GI work-up did not give a solution to her abdominal problems then it would be reasonable to try olanzapine at low dose.  I have sent in a prescription for 2.5 mg 1 nightly.  That is the lowest dose made.  If she uses a pill splitter she may try cutting it in half.  1/2 tablet may be sufficient for her.  She could experience benefit within the first day or 2 but give it a week or 2 as long as she is tolerating it to be sure. In order to minimize the risk of  daytime sleepiness take the olanzapine 2 to 3 hours before bedtime.  12/15/21 TC: Patient said she had increased the olanzapine and is still c/o headache and has had some dizziness today. She said she did "okay" on the mirtazapine. She said you had her on 1-1/2 tabs of 7.5. She said it probably isn't 100%, but felt she did okay otherwise.   01/11/22 appt noted: No solution to GI px.  Was told to get off hydrocodone but can't DT pain. Olanzapine tried with SE dizziness and HA after a month.  Without much benefit over mirtazapine. Overall ok.  Still some anxiety which doesn't want to eat but doing ok. Mirtazapine 7.5 mg HS helps. Sleep good.   Started seing [employer], good. About the same with mood  with some depression over heallth problems. Sees surgeon in October about pain problems. No panic but awakens with anxiety and general worry.   Less nausea.  02/27/22 TC: Does not appear that she is receiving any benefit from mirtazapine.  Mentioned in my notes that the obvious antidepressant choice would be duloxetine because it will sometimes help with pain as well as depression and anxiety. Stop mirtazapine and start duloxetine 20 mg capsule 1 daily for 1 week then 2 capsules daily. It will take 2 to 4 weeks to get any benefit from the duloxetine.  I would suggest in the interim that she try something that is stronger to help with nausea and anxiety but is likely to be insufficiently helpful for depression, therefore the need for duloxetine. So in addition to the duloxetine start olanzapine at  the lowest dose 2.5 mg tablet 1 nightly which could help with nausea appetite and anxiety almost right away.  If it causes too much sleepiness have her take it 2 to 3 hours before bedtime and she can even cut it in half if she has to. I will send in both prescriptions.  03/06/22 TC:  On 10/16 you wanted pt to take olanzapine and Cymbalta to replace mirtazapine.She did not start the Cymbalta but did take  olanzapine.She had side effects including insomnia,hand tremors,blurred vision and increased back pain.She decided to stop the med.She still wants to try Cymbalta but wants to know if it's ok to take with mirtazapine because she decided to start back taking it.     04/13/22 appointment noted:  Tried olanzapine twice with hand tremor and blurred vision. Back to mirtazapine 15 mg HS.   Less nausea.  Able to gain 6 # but no appetite. Bottomed out in October. Never tried Cymbalta.  Some concern over SE with it. Increased gabapentin to help pain with a little benefit. 300 BID and tolerating. In pain all day.  Tried Nucynta without help.   Mood is better than it was.  Working to accept chronic pain and limited activithy.  04/20/23 TC  Pt said that the cymbalta is making her jittery and more anxious. She has cut the mirtazapine to 7.5mg  not sure if working . Wants something different     MD response:   She is taking the lowest dose of dulxetine and not tolerating it so stop it. I don't have antoher option that will help pain.   If she thinks she is getting some benefit with mirtazapine then continue it.  If not stop it too I would also suggest she come get the [facility] genetic test I discussed with her.      06/20/22 appt noted: Mirtazapine helped regain some wt and nausea is better for 6-8 weeks. Currently on 7.5 mg mirtazapine and was up to 15 mg daily. Still some anxiety issues and needs Xanax. Heart burn without Depakote.  It's ok right now.   Peripheral nn stimulator in her back temporary for 2 mos.  No benefit so far.  Chronic pain.   Still seeing Debbie. Not markedly depressed.   Plan: Cont Depakote for reflux.  Depakote ER BRAND helped GI problems which were worse on generic.  But she wants to retry stopping it. Rec continue mirtazapine bc helpful  09/18/22 apt noted: Still taking Depakote ER 250 mg HS.  Reduced mirtazapine to 3.75 mg HS. No N and regained lost wt.  Still some  anxiety managed with mirtazapine.  GI is fine now.  Vocal problems.   Chronic back pain.  Accepting of this.  Still exercise.  Finished therapy with Onalee Hua.  Asks about CBD. Plan: Continue Xanax 0.25 mg BID for panic and GAD , but option increase TID bc helps nausea. Depakote ER BRAND helped GI problems which were worse on generic.  But she wants to retry stopping it. Option continue or stop mirtazapine  02/14/2023 appointment noted: Meds: Alprazolam 0.25 mg 3 times daily as needed anxiety usu just 0.125 AM, Depakote ER 250 daily, gabapentin 300 mg 3 times daily, mirtazapine 3.75 mg nightly.  Takig hydrocodone and muscle relaxer. SE no cog px. Decided to continue meds for now. When tried to stop Depakote had more acid reflux. No CBD bc got sleepy.  Still CBP.  GI ok.   Sleep great. Anxiety is controlled without panic except rarely. 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   Saw neurologist for Migraine and had problems with the meds RX.  Aimovig has helped..    Past Psychiatric Medication Trials: Failed multiple other antidepressants,  Celexa chest pain Duloxetine SE Nortriptyline hyper Hydroxyzine SE  nefazodone & Depakote for migraine,  Depakote brand ER 250 helps heartburn. Generic failed. Mirtazapine 7.5 mg benefit Olanzapine 2.5 mg HS with SE dizziness and HA citalopram palpitations Trazodone Clonazepam did not help anxiety, alprazolam helps anxiety and sleep  Review of Systems:  Review of Systems  Constitutional:  Positive for appetite change.  Gastrointestinal:  Negative for abdominal pain and nausea.  Musculoskeletal:  Positive for arthralgias and back pain.  Neurological:  Negative for tremors.  Psychiatric/Behavioral:  Negative for agitation, behavioral problems, confusion, decreased concentration, dysphoric mood, hallucinations, self-injury, sleep disturbance and suicidal ideas. The patient is nervous/anxious. The patient is not hyperactive.      Medications: I have reviewed the patient's current medications.  Current Outpatient Medications  Medication Sig Dispense Refill   AIMOVIG 140 MG/ML SOAJ INJECT 140 MG INTO SKIN EVERY 30 DAYS 3 mL 3   Ascorbic Acid (VITAMIN C PO) Take 500 mg by mouth daily.     Calcium Carbonate (CALCIUM 600 PO) Take 600-1,200 mg by mouth daily.     cholecalciferol (VITAMIN D3) 25 MCG (1000 UNIT) tablet Take 1,000 Units by mouth daily.     conjugated estrogens (PREMARIN) vaginal cream Place 1 Applicatorful vaginally 2 (two) times a week. 42.5 g 6   denosumab (PROLIA) 60 MG/ML SOSY injection      DEPAKOTE ER 250 MG 24 hr tablet Take 1 tablet (250 mg total) by mouth daily. 90 tablet 3   famotidine (PEPCID) 40 MG tablet TAKE 1 TABLET BY MOUTH DAILY 30 tablet 3   gabapentin (NEURONTIN) 300 MG capsule Take 300 mg by mouth every evening. 1 tab TID     HYDROcodone-acetaminophen (NORCO) 7.5-325 MG tablet Take 1.5 tablets by mouth every 6 (six) hours as needed for moderate pain.     Lactobacillus (ACIDOPHILUS) 100 MG CAPS Take 100 mg by mouth daily.     methocarbamol (ROBAXIN) 750 MG tablet Take 750 mg by mouth 3 (three) times daily.     Multiple Vitamin (MULTIVITAMIN) tablet Take 1 tablet by mouth daily.     ondansetron (ZOFRAN) 4 MG tablet Take 1 tablet (4 mg total) by mouth daily as needed for nausea or vomiting. 60 tablet 3   polyethylene glycol (MIRALAX / GLYCOLAX) 17 g packet Take 17 g by mouth daily.     PREVIDENT 5000 SENSITIVE 1.1-5 % GEL Place onto teeth.     ALPRAZolam (XANAX) 0.25 MG tablet Take 1 tablet (0.25 mg total) by mouth 3 (three) times daily as needed for anxiety. 270 tablet 1   mirtazapine (REMERON) 7.5 MG tablet Take 0.5 tablets (3.75 mg total) by mouth at bedtime. 90 tablet 1   No current facility-administered medications for this visit.    Medication Side Effects: None  Allergies:  Allergies  Allergen Reactions   Penicillins Other (See Comments)    Seizures as a child   Atarax  [Hydroxyzine] Other (See Comments)    Headache, depression   Bactrim [Sulfamethoxazole-Trimethoprim] Nausea Only   Celexa [Citalopram Hydrobromide] Other (See Comments)    Chest pain   Erythromycin      Upset stomach   Flagyl [Metronidazole]     Dizzy and increased heart rate   Nortriptyline Itching   Orphenadrine  Hand tremors   Prilosec [Omeprazole]     Abdominal pain   Requip [Ropinirole]     Made sx worse   Darvocet [Propoxyphene N-Acetaminophen] Itching   Percocet [Oxycodone-Acetaminophen] Itching    Past Medical History:  Diagnosis Date   Allergy    Anxiety    Arthritis    Cervical dysplasia    Diverticulitis    Endometriosis    Fatigue    Gallstones    GERD (gastroesophageal reflux disease)    History of hiatal hernia    pt states she currently has a hiatal hernia   HSV infection    oral   IBS (irritable bowel syndrome)    Migraine    Osteopenia    Osteoporosis 01/2019   T score -3.1   Osteoporosis    Plantar fasciitis    PONV (postoperative nausea and vomiting)    also difficult to wake up   Recurrent vaginitis    Reflux    Scoliosis    Wears glasses     Family History  Problem Relation Age of Onset   Cancer Father        lymphoma   Other Mother        bipolar,reflux   Bipolar disorder Mother    Other Brother        sinus problems   Cancer Maternal Aunt        uterine cancer   Breast cancer Maternal Aunt        40's   Diabetes Maternal Aunt    Cancer Paternal Aunt        Colon cancer   Breast cancer Cousin 50       Mat. 1st cousin    Social History   Socioeconomic History   Marital status: Married    Spouse name: Peyton Najjar   Number of children: 0   Years of education: 16   Highest education level: Not on file  Occupational History    Comment: [employer]  Tobacco Use   Smoking status: Former    Current packs/day: 0.00    Types: Cigarettes    Quit date: 05/17/1983    Years since quitting: 39.7   Smokeless  tobacco: Never  Vaping Use   Vaping status: Never Used  Substance and Sexual Activity   Alcohol use: Not Currently   Drug use: No   Sexual activity: Not Currently    Birth control/protection: Post-menopausal    Comment: intercourse age 37 , sexual partners more than 5  Other Topics Concern   Not on file  Social History Narrative   Pt is married, no children.  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    Caffeine- very little.   Right handed   Lives at home with her husband   Social Determinants of Health   Financial Resource Strain: Not on file  Food Insecurity: Not on file  Transportation Needs: Not on file  Physical Activity: Not on file  Stress: Not on file  Social Connections: Not on file  Intimate Partner Violence: Not on file    Past Medical History, Surgical history, Social history, and Family history were reviewed and updated as appropriate.   Please see review of systems for further details on the patient's review from today.   Objective:   Physical Exam:  There were no vitals taken for this visit.  Physical Exam Constitutional:      General: She is not in acute distress. Musculoskeletal:  General: No deformity.  Neurological:     Mental Status: She is alert and oriented to person, place, and time.     Cranial Nerves: No dysarthria.     Coordination: Coordination normal.  Psychiatric:        Attention and Perception: Attention and perception normal. She does not perceive auditory or visual hallucinations.        Mood and Affect: Mood is anxious. Mood is not depressed. Affect is not labile, blunt or tearful.        Speech: Speech normal.        Behavior: Behavior normal. Behavior is cooperative.        Thought Content: Thought content normal. Thought content is not paranoid or delusional. Thought content does not include homicidal or suicidal ideation. Thought content does not include suicidal plan.        Cognition and Memory:  Cognition and memory normal.        Judgment: Judgment normal.     Comments: Insight intact Some residual anxiety chronically       Lab Review:     Component Value Date/Time   NA 140 08/31/2021 1100   NA 134 03/26/2020 1056   K 4.4 08/31/2021 1100   CL 103 08/31/2021 1100   CO2 31 08/31/2021 1100   GLUCOSE 98 08/31/2021 1100   BUN 7 (L) 08/31/2021 1100   BUN 8 03/26/2020 1056   CREATININE 0.73 08/31/2021 1100   CALCIUM 9.4 08/31/2021 1100   PROT 6.9 08/31/2021 1100   ALBUMIN 4.0 08/31/2021 1100   AST 18 08/31/2021 1100   ALT 13 08/31/2021 1100   ALKPHOS 36 (L) 08/31/2021 1100   BILITOT 0.6 08/31/2021 1100   GFRNONAA >60 08/31/2021 1100   GFRAA 91 03/26/2020 1056       Component Value Date/Time   WBC 4.2 08/31/2021 1100   RBC 4.18 08/31/2021 1100   HGB 12.8 08/31/2021 1100   HCT 40.1 08/31/2021 1100   PLT 205 08/31/2021 1100   MCV 95.9 08/31/2021 1100   MCH 30.6 08/31/2021 1100   MCHC 31.9 08/31/2021 1100   RDW 12.4 08/31/2021 1100   LYMPHSABS 0.7 12/21/2019 0949   MONOABS 0.6 12/21/2019 0949   EOSABS 0.0 12/21/2019 0949   BASOSABS 0.0 12/21/2019 0949    Normal liver enzymes in September.  No results found for: "POCLITH", "LITHIUM"   No results found for: "PHENYTOIN", "PHENOBARB", "VALPROATE", "CBMZ"   .res Assessment: Plan:    Latoyna was seen today for follow-up.  Diagnoses and all orders for this visit:  Generalized anxiety disorder  Panic disorder without agoraphobia -     ALPRAZolam (XANAX) 0.25 MG tablet; Take 1 tablet (0.25 mg total) by mouth 3 (three) times daily as needed for anxiety. -     mirtazapine (REMERON) 7.5 MG tablet; Take 0.5 tablets (3.75 mg total) by mouth at bedtime.  Recurrent major depression in complete remission (HCC) -     mirtazapine (REMERON) 7.5 MG tablet; Take 0.5 tablets (3.75 mg total) by mouth at bedtime.  Migraine without aura and without status migrainosus, not intractable  Insomnia due to mental condition -      mirtazapine (REMERON) 7.5 MG tablet; Take 0.5 tablets (3.75 mg total) by mouth at bedtime.  Gastroesophageal reflux disease without esophagitis    30 min non face to face time with patient was spent on counseling and coordination of care. We discussed the following. Mood is better overall in the last months.   Was Much better  with with anxiety, appetite and sleep on mirtazapine 3.75 mg tablet 1 nightly initially.  Nausea, depression and anxiety Are back again.  she can stop it if desired.  Cont vs consider stopping it.  On 250 mg HS.  Depakote ER BRAND helped GI problems which were worse on generic.  But she wants to retry stopping it.  Supportive therapy dealing with health problems.  Back pain ongoing.    Answered questions about CBD. Option low dose Fetzima 20 for pain.  We discussed the short-term risks associated with benzodiazepines including sedation and increased fall risk among others.  Discussed long-term side effect risk including dependence, potential withdrawal symptoms, and the potential eventual d  ose-related risk of dementia. Disc newer studies that cast in doubt the relationship with dementia.  Not drowsy with alprazolam. Disc warnings about combo of Bz and opiates not relevant at this low dose. BC morning anxiety could try switching to Valium and she doesn't want to try it. Continue Xanax 0.25 mg BID for panic and GAD , but option increase TID bc helps nausea.  Consider Genesight testing bc med sensitivity and failures. ACTX partially reviewed but site is poorly designed and results difficult to see.  Disc this in detail available.  But increase risk SJS with CBZ and oxcarb  FU 6 mos  Meredith Staggers, MD, DFAPA  Please see After Visit Summary for patient specific instructions.  Future Appointments  Date Time Provider Department Center  04/02/2023 10:20 AM Carlus Pavlov, MD [facility] None  06/15/2023 10:00 AM [facility] NURSE [facility] None  10/24/2023 11:00 AM  Lomax, Amy, NP [facility] None    No orders of the defined types were placed in this encounter.     -------------------------------

## 2023-02-27 ENCOUNTER — Telehealth: Payer: Self-pay

## 2023-02-27 NOTE — Telephone Encounter (Signed)
We recevied an faxes from [employer] stating the patient enrollment in [employer] will expire on May 15, 2023. Patient is needing to submit a new enrollment application to [employer] for the year 2025.

## 2023-03-23 ENCOUNTER — Other Ambulatory Visit: Payer: Self-pay | Admitting: Family Medicine

## 2023-03-23 DIAGNOSIS — Z1231 Encounter for screening mammogram for malignant neoplasm of breast: Secondary | ICD-10-CM

## 2023-04-02 ENCOUNTER — Ambulatory Visit (INDEPENDENT_AMBULATORY_CARE_PROVIDER_SITE_OTHER): Payer: Medicare Other | Admitting: Internal Medicine

## 2023-04-02 ENCOUNTER — Encounter: Payer: Self-pay | Admitting: Internal Medicine

## 2023-04-02 ENCOUNTER — Encounter: Payer: Self-pay | Admitting: Family Medicine

## 2023-04-02 VITALS — BP 120/64 | HR 71 | Ht 67.0 in | Wt 137.0 lb

## 2023-04-02 DIAGNOSIS — M810 Age-related osteoporosis without current pathological fracture: Secondary | ICD-10-CM | POA: Diagnosis not present

## 2023-04-02 NOTE — Progress Notes (Signed)
Patient ID: Yolanda Carroll, female   DOB: 01-Jun-1955, 67 y.o.   MRN: [id number]   HPI  Yolanda Carroll is a 67 y.o.-year-old female, initially referred by Dr. Audie Box, presenting for follow-up for osteoporosis.  Last visit a year ago.  Interim history: She has a history of severe scoliosis for which she had steroid injections in back due to significant back pain.  She had  T4-ilium fusion surgery in 12/01/2020 by Dr. Tye Maryland in [place]. She has muscle pain along her spine but also burning at the site of her surgery.  The pain has plateaued. She initially lost weight due to the pain.  She is working with nutrition >> gained weight since last visit. She denies dizziness/orthostasis/poor vision.   She had a  fall 03/19/2023 but no fractures  - lost balance. X-rays normal. No concussion.  Reviewed history: She was diagnosed with osteoporosis in early 2000.  Reviewed patient's DXA scan reports: Date L1-L4 T score FN T score 33% distal Radius (left)  Ultra distal radius (left)  10/31/2022 ([facility]) N/a RFN: -2.4 (+8.2%*) LFN: -2.2 (+6.1%*) +0.7 (+3.1%) -0.1  10/19/2020 [facility]) N/a RFN: -2.8 (+5%) LFN: -2.5 (-1.4%) +0.4 (+4%) N/a  02/11/2019 ([facility]) N/a due to scoliosis RFN: -3.1 (-8.2%*) LFN: -2.4 (-4.7%)  -0.1 (-9.6%*)  0.0  02/06/2017 ([facility]) -0.3 (moderate scoliosis) RFN: -2.6 LFN: -2.2  +1.1 -0.1  12/29/2014 -0.5 (moderate scoliosis) RFN: -2.6 LFN: -1.7 n/a n/a  12/12/2012 -0.5 (moderate scoliosis) RFN: -2.4 LFN: -1.9 n/a n/a  10/04/2010 n/a RFN: -2.3 LFN: -1.8 n/a n/a   She has a history of steroid use: P.o. and IM for headaches, intraspinal for OA.  She had one episode of vertigo for 2 weeks in 2018.   Reviewed previous osteoporosis treatments: - Actonel in 2006-2007 - Forteo for 2 years in 2008 - Boniva in 2010 - HRT: Estradiol 0.5, Provera 2.5 - now off - Prolia -05/02/2019, 11/11/2019, 05/13/2020, 11/16/2020,  05/31/2021, 12/06/2021, 06/09/2022, 12/12/2022  No history of vitamin D deficiency: 03/02/2023: Vitamin D 58 02/15/2022: Vitamin D 55.8 02/10/2021: Vitamin D 65.2 Lab Results  Component Value Date   VD25OH 75.3 11/11/2020   VD25OH 49.3 12/04/2019   VD25OH 70.27 03/04/2019   VD25OH 55 03/13/2017  02/02/2020: Vitamin D 68.8 02/11/2018: Vit D 61.9 04/30/2017: vit D 53.4  She is on: - calcium citrate >> 250 mg 1-2 a day >> 500-600 mg daily >> 1 tab not quite every day - Vitamin D >> approximately 2000 units daily (15,000 units a week) >> off >> ~1000 units  No weightbearing exercises. She walks and does water exercises. Also, weightbearing exercises, but not in the last 2 weeks after her fall.  She is not taking high vitamin A doses.  Menopause was at 61s y/o.   Pt does have a FH of osteoporosis in mother, who had a hip fracture.  No history of kidney stones or hyperparathyroidism: 03/02/2023: Corrected calcium 8.94 02/15/2022: corrected calcium 8.69 02/02/2020: corrected calcium 8.74 (8.6-10.3) 02/11/2018: Ca normal  No history of thyrotoxicosis: 03/02/2023: TSH 3.82 02/15/2022: TSH 2.15 02/10/2021: TSH 3.9 02/02/2020: TSH 2.53 02/11/2018: TSH 2.79 05/01/2017: TSH 1.71 04/28/2013: TSH 2.063 No results found for: "TSH"   No CKD. Last BUN/Cr: 03/02/2023: 13/0.80, GFR 81 02/15/2022: 7/0.83, GFR 78 Lab Results  Component Value Date   BUN 7 (L) 08/31/2021   CREATININE 0.73 08/31/2021   On Depakote for migraines. On opioids and Neurontin for back pain. She sees  pain management.  She has anxiety and sees Dr. Jennelle Human.  ROS: + See HPI  I reviewed pt's medications, allergies, PMH, social hx, family hx, and changes were documented in the history of present illness. Otherwise, unchanged from my initial visit note.  Past Medical History:  Diagnosis Date   Allergy    Anxiety    Arthritis    Cervical dysplasia    Diverticulitis    Endometriosis    Fatigue    Gallstones    GERD  (gastroesophageal reflux disease)    History of hiatal hernia    pt states she currently has a hiatal hernia   HSV infection    oral   IBS (irritable bowel syndrome)    Migraine    Osteopenia    Osteoporosis 01/2019   T score -3.1   Osteoporosis    Plantar fasciitis    PONV (postoperative nausea and vomiting)    also difficult to wake up   Recurrent vaginitis    Reflux    Scoliosis    Wears glasses    Past Surgical History:  Procedure Laterality Date   ANTERIOR CERVICAL DECOMP/DISCECTOMY FUSION  04/17/2011   Procedure: ANTERIOR CERVICAL DECOMPRESSION/DISCECTOMY FUSION 2 LEVELS;  Surgeon: Hewitt Shorts;  Location: [facility];  Service: Neurosurgery;  Laterality: N/A;  Cervical five-six, six-seven anterior cervical decompression with fusion,  plating,  and bonegraft    APPENDECTOMY  1978   BACK SURGERY N/A    2022   BREAST BIOPSY  07/13/2011   Procedure: BREAST BIOPSY WITH NEEDLE LOCALIZATION;  Surgeon: Emelia Loron, MD;  Location: [facility];  Service: General;  Laterality: Right;  Right breast wire localization biopsy   BREAST EXCISIONAL BIOPSY Right 2013   BREAST SURGERY     Breast Bx-Benign   CHOLECYSTECTOMY N/A 09/08/2021   Procedure: LAPAROSCOPIC CHOLECYSTECTOMY;  Surgeon: Harriette Bouillon, MD;  Location: [facility];  Service: General;  Laterality: N/A;   CYSTOSCOPY     GYNECOLOGIC CRYOSURGERY     HERNIA REPAIR  08/02/1995   RIH   LAPAROSCOPIC ENDOMETRIOSIS FULGURATION  1997   PELVIC LAPAROSCOPY     ROTATOR CUFF REPAIR     right 2002 left 2000   Social History   Socioeconomic History   Marital status: Married    Spouse name: Peyton Najjar   Number of children: 0   Years of education: 16  Social Needs  Occupational History    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  Occupation: employed at [employer].    Caffeine- very little.   Current Outpatient Medications on File Prior to Visit  Medication Sig Dispense Refill   AIMOVIG 140 MG/ML SOAJ INJECT 140 MG INTO SKIN EVERY 30 DAYS 3 mL 3   ALPRAZolam (XANAX) 0.25 MG tablet Take 1 tablet (0.25 mg total) by  mouth 3 (three) times daily as needed for anxiety. 270 tablet 1   Ascorbic Acid (VITAMIN C PO) Take 500 mg by mouth daily.     Calcium Carbonate (CALCIUM 600 PO) Take 600-1,200 mg by mouth daily.     cholecalciferol (VITAMIN D3) 25 MCG (1000 UNIT) tablet Take 1,000 Units by mouth daily.     conjugated estrogens (PREMARIN) vaginal cream Place 1 Applicatorful vaginally 2 (two) times a week. 42.5 g 6   denosumab (PROLIA) 60 MG/ML SOSY injection      DEPAKOTE ER 250 MG 24 hr tablet Take 1 tablet (250 mg total) by mouth daily. 90 tablet 3   famotidine (PEPCID) 40 MG tablet TAKE 1 TABLET BY MOUTH DAILY 30 tablet 3   gabapentin (NEURONTIN) 300 MG capsule Take 300 mg by mouth every evening. 1 tab TID     HYDROcodone-acetaminophen (NORCO) 7.5-325 MG tablet Take 1.5 tablets by mouth every 6 (six) hours as needed for moderate pain.     Lactobacillus (ACIDOPHILUS) 100 MG CAPS Take 100 mg by mouth daily.     methocarbamol (ROBAXIN) 750 MG tablet Take 750 mg by mouth 3 (three) times daily.     mirtazapine (REMERON) 7.5 MG tablet Take 0.5 tablets (3.75 mg total) by mouth at bedtime. 90 tablet 1   Multiple Vitamin (MULTIVITAMIN) tablet Take 1 tablet by mouth daily.     ondansetron (ZOFRAN) 4 MG tablet Take 1 tablet (4 mg  total) by mouth daily as needed for nausea or vomiting. 60 tablet 3   polyethylene glycol (MIRALAX / GLYCOLAX) 17 g packet Take 17 g by mouth daily.     PREVIDENT 5000 SENSITIVE 1.1-5 % GEL Place onto teeth.     No current facility-administered medications on file prior to visit.   Allergies  Allergen Reactions   Penicillins Other (See Comments)    Seizures as a child   Atarax [Hydroxyzine] Other (See Comments)    Headache, depression   Bactrim [Sulfamethoxazole-Trimethoprim] Nausea Only   Celexa [Citalopram Hydrobromide] Other (See Comments)    Chest pain   Erythromycin      Upset stomach   Flagyl [Metronidazole]     Dizzy and increased heart rate   Nortriptyline Itching   Orphenadrine     Hand tremors   Prilosec [Omeprazole]     Abdominal pain   Requip [Ropinirole]     Made sx worse   Darvocet [Propoxyphene N-Acetaminophen] Itching   Percocet [Oxycodone-Acetaminophen] Itching   Family History  Problem Relation Age of Onset   Cancer Father        lymphoma   Other Mother        bipolar,reflux   Bipolar disorder Mother    Other Brother        sinus problems   Cancer Maternal Aunt        uterine cancer   Breast cancer Maternal Aunt        40's   Diabetes Maternal Aunt    Cancer Paternal Aunt        Colon cancer   Breast cancer Cousin 50       Mat. 1st cousin   PE: BP 120/64   Pulse 71   Ht 5\' 7"  (1.702 m)   Wt 137 lb (62.1 kg)   SpO2 94%   BMI 21.46 kg/m  Wt Readings from Last 10 Encounters:  04/02/23 137 lb (62.1 kg)  12/12/22 134 lb 12.8 oz (61.1 kg)  10/24/22 134 lb 8 oz (61 kg)  10/12/22 131 lb (59.4 kg)  05/01/22 124 lb 6 oz (56.4 kg)  03/31/22 119 lb (54 kg)  10/20/21 116 lb 8 oz (52.8 kg)  10/19/21 115 lb 12.8 oz (52.5 kg)  10/03/21 118 lb (53.5 kg)  09/08/21 116 lb (52.6 kg)   Constitutional:  thin, in NAD Eyes: no exophthalmos ENT: no thyromegaly, no cervical lymphadenopathy Cardiovascular: RRR, No MRG Respiratory: CTA  B Musculoskeletal: no deformities - no scoliosis Skin: no rashes Neurological: + tremor with outstretched hands  Assessment: 1. Osteoporosis  Plan: 1. Osteoporosis -Likely age-related + postmenopausal + she also has family history of osteoporosis -She had a fall since last visit, but no fractures. -I reviewed her bone density reports from 01/2018 in 01/2019 and both femoral neck T-scores decreased while only the right femoral neck decrease was significant.  She also had a decreased bone density at the level of her radius.  Spine could not be analyzed in 2020 due to scoliosis.  After these results returned, she agreed to start Prolia (started 04/2019).  She initially had back pain but this was chronic for her and not necessarily related to Prolia. Her bone density scan in 11/2020 showed stability of her T-scores. -Since last visit, she had another bone density scan performed on 10/31/2022 (on another machine) and this showed increases in all of her T-scores, an excellent result.  -She tolerates Prolia well, without jaw/hip/thigh pain -We discussed about continuing Prolia for 10 years or more, if needed -She was able to undergo scoliosis surgery in 11/2020, however, she still had significant pain afterwards.  Her pain actually increased despite physical therapy, water exercises.  She did lose weight in the past and we discussed about trying to gain weight and build the muscles around her spine to maintain her spine architecture.  Since last visit, she was able to gain 18 pounds, which is excellent.  I also recommended to increase weightbearing exercises at last visit after checking with neurosurgery.  She is doing some weightbearing exercises, but stopped 2 weeks ago, after her fall. -Previously contemplating stopping osteoporosis therapy but I strongly advised her not to do so. -Latest vitamin D level was normal in 02/2022: 55 (checked by PCP).  I advised her to add 1000 units vitamin D daily at that  time as her vitamin D level was decreasing.  She is taking a vitamin D presumably 1000 units but not quite daily. -I also recommended to get at least 1200 mg of calcium from the diet and supplement with calcium citrate if she felt that she was not getting this from the diet (she is on an H2 blocker which can decrease the absorption of calcium carbonate).  At today's visit, she tells me that she is taking a calcium citrate tablet but she is not sure about the amount.  Also, she is not taking it daily. -Calcium and kidney function were normal in 02/2023 -Plan is to repeat a bone density scan next year -she tells me she will need to obtain it on a different machine as her OB/GYN provider retired and she will need to change doctors. -I will see her back in 1 year  - Total time spent for the visit: 30 min, in precharting, postcharting, reviewing pertinent records, obtaining medical information from the chart and from the pt, reviewing her  previous labs, imaging evaluations, and treatments, reviewing her symptoms, counseling her about her osteoporosis (please see the discussed topics above), and developing a plan to further investigate and treat it;  she had a number of questions which I addressed  Carlus Pavlov, MD PhD [facility]

## 2023-04-02 NOTE — Patient Instructions (Signed)
Please continue with Prolia as discussed.  Continue vitamin D 1000 units daily.   If you are not getting at least 1200 mg calcium from the diet, add calcium citrate 500 mg daily.  Try to build up weight bearing exercises.  Please come back for a follow-up appointment in 1 year.

## 2023-04-04 ENCOUNTER — Telehealth: Payer: Self-pay | Admitting: *Deleted

## 2023-04-04 ENCOUNTER — Telehealth: Payer: Self-pay

## 2023-04-04 NOTE — Telephone Encounter (Signed)
Pt Aimovig form faxed on 04/04/2023

## 2023-04-04 NOTE — Telephone Encounter (Signed)
Gve Aimovig Enrollment form to Medical Records on 04/04/2023

## 2023-04-23 ENCOUNTER — Telehealth: Payer: Self-pay | Admitting: *Deleted

## 2023-04-23 ENCOUNTER — Other Ambulatory Visit (HOSPITAL_COMMUNITY): Payer: Self-pay

## 2023-04-23 NOTE — Telephone Encounter (Signed)
Per test claim PA is not needed. Patient is in the initial benefit phase of her insurance plan making the co-pay for 1 month $340.71 at this time.

## 2023-04-23 NOTE — Telephone Encounter (Signed)
Noted, sent info to pt.

## 2023-04-23 NOTE — Telephone Encounter (Signed)
Pt states PA Aimovig needed

## 2023-04-25 ENCOUNTER — Other Ambulatory Visit: Payer: Self-pay | Admitting: Gastroenterology

## 2023-05-01 NOTE — Telephone Encounter (Signed)
 Prolia VOB initiated via [email or web address]  Next Prolia inj DUE:  06/15/23

## 2023-05-04 ENCOUNTER — Ambulatory Visit
Admission: RE | Admit: 2023-05-04 | Discharge: 2023-05-04 | Disposition: A | Discharge status: Home / Self Care | Payer: Medicare Other | Source: Ambulatory Visit | Attending: Family Medicine | Admitting: Family Medicine

## 2023-05-04 DIAGNOSIS — Z1231 Encounter for screening mammogram for malignant neoplasm of breast: Secondary | ICD-10-CM

## 2023-05-21 ENCOUNTER — Ambulatory Visit: Payer: Medicare Other

## 2023-05-22 NOTE — Telephone Encounter (Signed)
 Medical Buy and Annette Stable - Prior Authorization NOT required

## 2023-05-22 NOTE — Telephone Encounter (Addendum)
 Patient is ready for scheduling on or after 06/15/23 BUY AND BILL  Out-of-pocket cost due at time of visit: $257 (Medicare deductible)  Primary: Medicare Prolia  co-insurance: 20% (approximately $323.52) Admin fee co-insurance: 20% (approximately $25)  Deductible: $0 of $257 met  Prior Auth: NOT required   Secondary: [employer] Medicare Supplement Plan G Prolia  co-insurance:  Admin fee co-insurance:   Deductible: does NOT cover Medicare deductible  Prior Auth: NOT required PA# Valid:   ** This summary of benefits is an estimation of the patient's out-of-pocket cost. Exact cost may vary based on individual plan coverage.

## 2023-05-24 ENCOUNTER — Encounter: Payer: Self-pay | Admitting: Family Medicine

## 2023-05-30 ENCOUNTER — Telehealth: Payer: Self-pay

## 2023-05-30 ENCOUNTER — Other Ambulatory Visit (HOSPITAL_COMMUNITY): Payer: Self-pay

## 2023-05-30 NOTE — Telephone Encounter (Signed)
 Sent mychart to pt about approval.

## 2023-05-30 NOTE — Telephone Encounter (Signed)
     I was sent a request to do a PA-however active PA already on file good thru 05/14/2024

## 2023-06-11 ENCOUNTER — Other Ambulatory Visit: Payer: Self-pay | Admitting: Psychiatry

## 2023-06-11 DIAGNOSIS — K219 Gastro-esophageal reflux disease without esophagitis: Secondary | ICD-10-CM

## 2023-06-11 NOTE — Telephone Encounter (Signed)
Called [employer] at [phone] and spoke w/ Cordelia Pen. She will fax copy of approval letter to [phone]. Can take 30-60 min to receive via fax.

## 2023-06-11 NOTE — Telephone Encounter (Signed)
LVM to rc regarding if she is still taking Depakote 250 mg  Lv 10/2  Cont vs consider stopping it.  On 250 mg HS.  Depakote ER BRAND helped GI problems which were worse on generic.  But she wants to retry stopping it.

## 2023-06-15 ENCOUNTER — Ambulatory Visit: Payer: Medicare Other

## 2023-06-20 ENCOUNTER — Ambulatory Visit: Payer: Medicare Other

## 2023-06-26 ENCOUNTER — Ambulatory Visit: Payer: Medicare Other

## 2023-07-03 ENCOUNTER — Ambulatory Visit: Payer: Medicare Other

## 2023-07-03 DIAGNOSIS — M810 Age-related osteoporosis without current pathological fracture: Secondary | ICD-10-CM | POA: Diagnosis not present

## 2023-07-03 MED ORDER — DENOSUMAB 60 MG/ML ~~LOC~~ SOSY
60.0000 mg | PREFILLED_SYRINGE | Freq: Once | SUBCUTANEOUS | Status: AC
  Administered 2023-07-03: 60 mg via SUBCUTANEOUS

## 2023-07-03 MED ORDER — DENOSUMAB 60 MG/ML ~~LOC~~ SOSY
60.0000 mg | PREFILLED_SYRINGE | Freq: Once | SUBCUTANEOUS | Status: AC
  Administered 2024-01-04: 60 mg via SUBCUTANEOUS

## 2023-07-03 NOTE — Progress Notes (Signed)
 After obtaining consent, and per orders of Dr. Elvera Lennox, injection of Prolia 60 mg given by Pollie Meyer. Patient instructed to remain in clinic for 20 minutes afterwards, and to report any adverse reaction to me immediately.

## 2023-07-08 NOTE — Telephone Encounter (Signed)
 Last Prolia inj 07/03/23 Next Prolia inj due 01/01/24

## 2023-07-09 ENCOUNTER — Encounter: Payer: Self-pay | Admitting: Family Medicine

## 2023-07-09 ENCOUNTER — Ambulatory Visit
Admission: RE | Admit: 2023-07-09 | Discharge: 2023-07-09 | Disposition: A | Discharge status: Home / Self Care | Payer: Medicare Other | Source: Ambulatory Visit | Attending: Family Medicine | Admitting: Family Medicine

## 2023-07-09 ENCOUNTER — Other Ambulatory Visit: Payer: Self-pay

## 2023-07-09 ENCOUNTER — Ambulatory Visit (INDEPENDENT_AMBULATORY_CARE_PROVIDER_SITE_OTHER): Payer: Medicare Other | Admitting: Family Medicine

## 2023-07-09 VITALS — BP 120/72 | Ht 68.0 in | Wt 125.0 lb

## 2023-07-09 DIAGNOSIS — M79644 Pain in right finger(s): Secondary | ICD-10-CM

## 2023-07-09 NOTE — Progress Notes (Signed)
 DATE OF VISIT: 07/09/2023        Yolanda Carroll DOB: 05-12-1956 MRN: [id number]  CC:  Rt 4th finger pain  History of present Illness: Yolanda Carroll is a 68 y.o. female who presents for relation of right fourth finger pain. RHD Notes pain and catching at the 4th PIP of her right hand Think she might have some swelling at the knuckle Denies any specific injury or trauma Often worse in the morning when waking and will sometimes have to pop open the finger Does not bother her much during the day No previous issues with her hand Not taking anything for it at this time No prior imaging of the hand  Medications:  Outpatient Encounter Medications as of 07/09/2023  Medication Sig   AIMOVIG 140 MG/ML SOAJ INJECT 140 MG INTO SKIN EVERY 30 DAYS   ALPRAZolam (XANAX) 0.25 MG tablet Take 1 tablet (0.25 mg total) by mouth 3 (three) times daily as needed for anxiety.   Ascorbic Acid (VITAMIN C PO) Take 500 mg by mouth daily.   Calcium Carbonate (CALCIUM 600 PO) Take 600-1,200 mg by mouth daily.   cholecalciferol (VITAMIN D3) 25 MCG (1000 UNIT) tablet Take 1,000 Units by mouth daily.   conjugated estrogens (PREMARIN) vaginal cream Place 1 Applicatorful vaginally 2 (two) times a week.   denosumab (PROLIA) 60 MG/ML SOSY injection    DEPAKOTE ER 250 MG 24 hr tablet TAKE 1 TABLET BY MOUTH DAILY   famotidine (PEPCID) 40 MG tablet TAKE 1 TABLET BY MOUTH DAILY   gabapentin (NEURONTIN) 300 MG capsule Take 300 mg by mouth every evening. 1 tab TID   HYDROcodone-acetaminophen (NORCO) 7.5-325 MG tablet Take 1.5 tablets by mouth every 6 (six) hours as needed for moderate pain.   Lactobacillus (ACIDOPHILUS) 100 MG CAPS Take 100 mg by mouth daily.   methocarbamol (ROBAXIN) 750 MG tablet Take 750 mg by mouth 3 (three) times daily.   mirtazapine (REMERON) 7.5 MG tablet Take 0.5 tablets (3.75 mg total) by mouth at bedtime.   Multiple Vitamin (MULTIVITAMIN) tablet Take 1 tablet by mouth daily.   polyethylene  glycol (MIRALAX / GLYCOLAX) 17 g packet Take 17 g by mouth daily.   PREVIDENT 5000 SENSITIVE 1.1-5 % GEL Place onto teeth.   Facility-Administered Encounter Medications as of 07/09/2023  Medication   [START ON 12/31/2023] denosumab (PROLIA) injection 60 mg    Allergies: is allergic to penicillins, atarax [hydroxyzine], bactrim [sulfamethoxazole-trimethoprim], celexa [citalopram hydrobromide], erythromycin, flagyl [metronidazole], nortriptyline, orphenadrine, prilosec [omeprazole], requip [ropinirole], darvocet [propoxyphene n-acetaminophen], and percocet [oxycodone-acetaminophen].  Physical Examination: Vitals: BP 120/72   Ht 5\' 8"  (1.727 m)   Wt 125 lb (56.7 kg)   BMI 19.01 kg/m  GENERAL:  Yolanda Carroll is a 68 y.o. female appearing their stated age, alert and oriented x 3, in no apparent distress.  SKIN: no rashes or lesions, skin clean, dry, intact MSK: Hand: Right hand with slight swelling at the fourth PIP joint.  No increased redness or warmth.  Mild tenderness to palpation over the A3 pulley at the PIP joint.  Full range of motion of the MCP, PIP, DIP.  Slight triggering noted at the PIP joint.  No significant tenderness over the A1 pulley.  Remainder of hand exam was normal.  She has normal grip strength bilaterally NEURO: sensation intact to light touch extremity bilaterally VASC: pulses 2+ and symmetric radial artery bilaterally, no edema  Radiology: Limited MSK ultrasound right fourth finger Date: 07/09/2023 Indication: Right fourth finger pain  Findings: -Fourth MCP with slight degenerative changes.  Slight thickening of the A1 pulley.  No visible triggering or catching with dynamic testing at the A1 pulley -Fourth PIP with mild degenerative changes.  Thickening of the A3 pulley.  No visible triggering or catching with dynamic testing at the A3 pulley -Fourth DIP without any gross deformity -No increased Doppler flow or neovascularization  Impression: -Fourth finger MCP  and PIP degenerative changes consistent with osteoarthritis -Slight thickening of the A1 and A3 pulleys, but no dynamic or reproducible triggering Images and interpretation completed by Darene Lamer, DO today   Assessment & Plan Finger pain, right Right fourth finger pain with MSK ultrasound showing mild degenerative changes at the MCP and PIP joints.  Also thickening of the A1 and A3 pulleys and possible underlying trigger finger  Plan: -Imaging: Will obtain finger x-ray to assess underlying degree of osteoarthritis -Instructed on Band-Aid splint to wear at bedtime and throughout the day as needed to allow the finger to rest and to prevent any triggering -Can apply Voltaren gel every 6 hours as needed -Will reach out with x-ray results and discuss next steps.  Follow-up 2 to 4 weeks if worsening or not improving, could consider cortisone injection into the A1 or A3 pulley depending on her symptoms at that time versus referral to hand surgeon Patient & husband expressed understanding & agreement with above.  Encounter Diagnosis  Name Primary?   Finger pain, right Yes    Orders Placed This Encounter  Procedures   [place] LIMITED JOINT SPACE STRUCTURES UP RIGHT   DG Finger Ring Right

## 2023-07-10 ENCOUNTER — Encounter: Payer: Self-pay | Admitting: Internal Medicine

## 2023-07-23 ENCOUNTER — Other Ambulatory Visit: Payer: Self-pay | Admitting: Neurological Surgery

## 2023-07-23 ENCOUNTER — Ambulatory Visit: Payer: Medicare Other | Admitting: Gastroenterology

## 2023-07-23 DIAGNOSIS — M419 Scoliosis, unspecified: Secondary | ICD-10-CM

## 2023-07-26 ENCOUNTER — Ambulatory Visit
Admission: RE | Admit: 2023-07-26 | Discharge: 2023-07-26 | Discharge status: Home / Self Care | Source: Ambulatory Visit | Attending: Neurological Surgery | Admitting: Neurological Surgery

## 2023-07-26 ENCOUNTER — Ambulatory Visit
Admission: RE | Admit: 2023-07-26 | Discharge: 2023-07-26 | Disposition: A | Discharge status: Home / Self Care | Source: Ambulatory Visit | Attending: Neurological Surgery | Admitting: Neurological Surgery

## 2023-07-26 DIAGNOSIS — M419 Scoliosis, unspecified: Secondary | ICD-10-CM

## 2023-08-03 ENCOUNTER — Other Ambulatory Visit

## 2023-08-15 ENCOUNTER — Encounter: Payer: Self-pay | Admitting: Psychiatry

## 2023-08-15 ENCOUNTER — Ambulatory Visit: Payer: Medicare Other | Admitting: Psychiatry

## 2023-08-15 DIAGNOSIS — M549 Dorsalgia, unspecified: Secondary | ICD-10-CM

## 2023-08-15 DIAGNOSIS — K219 Gastro-esophageal reflux disease without esophagitis: Secondary | ICD-10-CM

## 2023-08-15 DIAGNOSIS — F411 Generalized anxiety disorder: Secondary | ICD-10-CM

## 2023-08-15 DIAGNOSIS — F410 Panic disorder [episodic paroxysmal anxiety] without agoraphobia: Secondary | ICD-10-CM

## 2023-08-15 DIAGNOSIS — R110 Nausea: Secondary | ICD-10-CM

## 2023-08-15 DIAGNOSIS — G8929 Other chronic pain: Secondary | ICD-10-CM

## 2023-08-15 DIAGNOSIS — F331 Major depressive disorder, recurrent, moderate: Secondary | ICD-10-CM | POA: Diagnosis not present

## 2023-08-15 DIAGNOSIS — G43009 Migraine without aura, not intractable, without status migrainosus: Secondary | ICD-10-CM | POA: Diagnosis not present

## 2023-08-15 DIAGNOSIS — F5105 Insomnia due to other mental disorder: Secondary | ICD-10-CM

## 2023-08-15 MED ORDER — AMITRIPTYLINE HCL 10 MG PO TABS
10.0000 mg | ORAL_TABLET | Freq: Every day | ORAL | 0 refills | Status: DC
Start: 2023-08-15 — End: 2023-08-20

## 2023-08-15 NOTE — Progress Notes (Signed)
 [name] [id number] [birth year] 68 y.o.   Subjective:   Patient ID:  Yolanda Carroll is a 68 y.o. (DOB October 29, 1955) female.  Chief Complaint:  Chief Complaint  Patient presents with   Follow-up   Anxiety   Sleeping Problem   Stress    Yolanda Carroll presents to the office today for follow-up of anxiety and migraine.  seen in July 2020.  No meds were changed.  She requires name brand Depakote because the generic caused worsening headaches.  Retired November 14, 2018.  Was so burned out at work.  Helping with church projects and helping friends.  Focusing on her health and doing PT for back and hip.  No kids or gkids.  Helps care for 68 yo M-in-law.  Yolanda Carroll is also busy which helps.  Given a surprise retirement party.     seen January 2021.  Nefazodone is no longer manufactured and she had to wean off.  We also discussed the potential of weaning off Depakote because Aimovig had been very effective at managing her migraine headache.  She called August 11, 2019 stating that she did taper off of Depakote because she did not feel like she needed it any more.  Her last dose was July 23, 2019 but afterwards noticed heartburn and swallowing issues.  Her GI doctor added Pepcid 40 mg daily.  Her neurologist had indicated that Depakote can sometimes help with esophageal issues and she wondered if that was connected.  She was given the option to restart a lower dose and perhaps taper more slowly.  Off Depakote for a month.  She elected not to restart it.  08/19/2019 appointment the following is noted: Tinnitus and mild anxiety worse off the Depakote and with some mind racing esp in the AM.   Most is better. Reflux is worse off Depakote ER 250 and wondering if she should restart it.   Disc article on Depakote helping GERD by increasing lower esophageal sphincter tone. Has travelled some with family events.  M-in-law 92 soon.   HA good with Aimovig. Don't do season changes well and more HA in Spring  often. Sleep not great lately with leg cramps.  Started more exercise.  Started snoring and Yolanda Carroll is a light sleeper.  Meds help. Plan no med changes.  10/03/2019 phone call with patient asking to restart residual nefazodone that she has on hand.  She felt that it helped back pain and she is experiencing more back pain recently.  She is aware that it is no longer manufactured to our knowledge and once she runs out it will not be available.  She indicated she had an off to take half of 150 mg nefazodone tablet for about 2 to 3 months and would like to do so.  11/05/2019 appointment with the following noted:  Appt moved earlier DT the following. Had stopped Depakote and nefazadone and reflux got worse.  Restarting Depakote didn't help much.  Restarted nefazodaone and reflux better right away.  May not need Depakote but doesn't want to change.  Can't exercise DT back pain since Jan ans worse since March.  Getting new doctor.  More depressed and anxious and sleep problems DT back pain.   Usually only 0.25 mg alprazolam at night. Plan: has to stop nefazodone when it runs out bc no longer manufactured.  Therefore will try trazodone in it's place.  01/07/20 appt with the following noted: She and Yolanda Carroll both got Covid.  Was pretty sick for 10 days.  Lost taste.  Had little resp stuff but severe diarrhea and nausea and couldn't keep fluids down.  No residual sx. Yolanda Carroll had resp sx with pneumonia and had to get O2.  Hosp for 3 days. Still have some nefazodone and nursing it along. Tried trazodone 50 HS for 2 nights and didn't sleep well and had a HA  Nefazodone and Depakote both helped the GERD.  She has enough nefazadone for about 6 weeks. Plan: Nefazodone helped the reflux and she wants to take it as long as possible.  She didn't have withdrawal off nefazodone. When she runs out then start trazodone.  If that fails then use Viibryd.  Disc SE and alternatives.   Rec try trazodone again but use 100 mg and see if sleeping  better prevents the AM HA.  If HA recurs then viibryd. Starter kit.  Depakote didn't help as much as expected and she might stop it.   02/18/2020 phone call: Yolanda Carroll called to report that she is experiencing high anxiety.  It is causing chest discomfort and breathing problems. Has appt 11/18 and is on the wait list but needs to discuss how to relief this anxiety.  Please call. Response: Note Rtc to patient, she does have the Viibryd and does remember that discussion. She has not been able to start that because she received a steroid injection a couple weeks ago and caused her stomach to be upset. She didn't want to start it knowing that's a side effect. She feels like the steroid injection may have worsened all her symptoms. She did try the trazodone but it was too much for her, even taking a 1/2 tablet. She does have some serzone left and has been taking that until this improves. Also taking Xanax as needed during day if needed. Hoping all of this will improve soon. Informed her I would update Dr. Jennelle Human with her information.        04/01/2020 appointment with the following noted: Out of nefazodone a few weeks ago.  More reflux off it.  Tried trazodone a couple of times.  Tried 100 mg trazodone and heart skipped.  50 mg awoke with chest tightness.  Long haul Covid with GI sx.  Nausea and anxiety problems daily since mid September.  Covid early August and got monoclonal infusion which helped.  Headed to long haul Covid clinic in [place] Monday.   Needed to increase alprazolam to BID.  And 80% better with that.  Panic some out of bed.  Sx started after steroid shot for her back 9/21.   Hasn't tried Viibryd DT fear of GI worsening.  Poor appetite and lost 15#. Not taken tramadol yet. Migraine under control. Plan: no med changes  06/01/20 appt with following noted: Starting generic Depakote ER generic today. Started Mirtazapine 3.75 mg and it worked right away for nausea for a week.  Then increased to 7.5  mg HS.  Most days is feeling really well.  No SE except mild drowsiness. Still has post Covid problems with stomach but much better.  Regained 7# of needed weight.  Need to try to gain 10# more. Zofran failed. Had a lot anxiety with N but it's better now.  Was waking with panic and no longer N and anxiety better too. Severe scoliosis facing big surgery in summer with long recovery.  Can't ride in car for long. Plan: No med changes  08/18/2020 appointment with the following noted: Gained 12# and thankful for mirtazapine. Long Covid GI sx.  Better  not gone with mirtazapine. Rare AM panic.  Sleep good. Scoliosis and may require surgery. Can't sit or stand for long Depakote brand ER 250 helps heartburn. Generic failed. Patient denies difficulty with sleep initiation with Xanax.. Denies appetite disturbance.  Patient reports that energy and motivation have been good.  Patient denies any difficulty with concentration.  Patient denies any suicidal ideation. More depressed and tearful with pain but varies with pain levels.  Good and bad days.  Not too depressed with anxiety worse. Plan: Continue mirtazapine 7.5 mg with Xanax for GI problems including nausea and appetite.  It has worked some.  Try higher dose. Insomnia managed with  With alprazolam.    11/18/2020 appointment with the following noted: Increased mirtazapine to 7.5 mg 1 and 1/2 tablet bc  less anxious with it. Facing big back surgery for severe scoliosis 11/30/20.  Can't drive in car for distance or travel. Now on Medicare. Depakote ER BRAND helped GI problems which were worse on generic. Not markedly depressed.  Patient reports stable mood and denies depressed or irritable moods.   Patient denies difficulty with sleep initiation or maintenance. Denies appetite disturbance.  Patient reports that energy and motivation have been good.  Patient denies any difficulty with concentration.  Patient denies any suicidal ideation. Plan: No med  changes Continue Depakote brand ER 250 mg daily, continue alprazolam 0.25 mg nightly, continue mirtazapine 7.5 mg nightly  04/26/2021 appointment with the following noted: Still on hydrocodone from surgery on back in July.  Driving.  Healing slowly. Pain before surgery is gone now.  Now pain related to adjusting to rod. But surgery was successful. Peyton Najjar has been helpful. Has reduced mirtazapine 3.75 mg HS to try to come off it.  Sleep a little worse if doesn't take it.  Stomach problems are better.   No HA in a year but started having migraines in a year.  Migraines started after change in logo of brand Depakote so restarted Aimovig.  Depakote also helped acid reflux. Mirtazapine helped sleep and appetite.  Weight is good.  Sleep is good with it still. Mood is OK. Active at church and people are supportive. Continue Depakote brand ER 250 mg daily, continue alprazolam 0.25 mg nightly, continue mirtazapine 7.5 mg nightly  08/03/2021 appointment the following noted: Not doing so good.  Was doing great after surgery. A few weeks ago the nausea and anxiety came back. Had reduced mirtazapine to 3.75 mg HS, back up to 7.5 mg HS about 2 weeks.  No effects so far. Mad and depressed and not me.  Nausea coming back has been upsetting. Pursuing GI work up. Awakens with anxiety.   No sleep trouble. Conc worse. Plan:Was Much better with with anxiety, appetite and sleep on mirtazapine 7.5 mg tablet 1 nightly and relapsed when decreased to 1/tablet nightly,  so increase to 1 &  1/2 of the 7.5 mg tablets HS .   09/27/2021 appointment with the following noted: Last few weeks tough. Cholecystectomy 09/08/21 DT chronic nausea but not better.  Yesterday no nausea for first time in months. Losing weight on bland diet. But it stopped. Chronic back pain after surgery. SE mirtazapine dry mouth. Usually feels ok in AM but afternoons gets nausea.   Xanax does help afternoon nausea so maybe it's  anxiety. Exhausted from all of this.  Still blames some of it on covid. Not sleepy with Xanax.   Sad bc can't function normally.  Usually not depressed. Sleep is fine. Plan:Was Much better  with with anxiety, appetite and sleep on mirtazapine 7.5 mg tablet 1 nightly initially.  Nausea, depression and anxiety Are back again. Option increase to 15 mg .  Yes.  10/26/2021 appointment with the following noted: It helped some.  Still some nausea.  CT scan next week.  Off and on nausea without pattern.  Not clearly anxiety but Xanax prn does help.  Frustrating that it is not predictable. Tolerated in crease in mirtazapine 15 with mild hangover.  Still some anxiety first thing in AM but not as bad as it was and is worse without Xanax.  No reason for the anxiety.   Not depressed but discouraged.  Tries to stay active and involved. Sleep is great always with Xanax. Chronic opiates DT back surgery for scoliosis Plan: Continue Xanax 0.25 mg BID for panic and GAD , but option increase TID bc helps nausea.  11/10/21 TC: Pt called reporting CT scan results fine. Requesting as discussed to change Mirtazepine to Olanzapine. MD: As discussed at the last office visit, if her GI work-up did not give a solution to her abdominal problems then it would be reasonable to try olanzapine at low dose.  I have sent in a prescription for 2.5 mg 1 nightly.  That is the lowest dose made.  If she uses a pill splitter she may try cutting it in half.  1/2 tablet may be sufficient for her.  She could experience benefit within the first day or 2 but give it a week or 2 as long as she is tolerating it to be sure. In order to minimize the risk of daytime sleepiness take the olanzapine 2 to 3 hours before bedtime.  12/15/21 TC: Patient said she had increased the olanzapine and is still c/o headache and has had some dizziness today. She said she did "okay" on the mirtazapine. She said you had her on 1-1/2 tabs of 7.5. She said it probably  isn't 100%, but felt she did okay otherwise.   01/11/22 appt noted: No solution to GI px.  Was told to get off hydrocodone but can't DT pain. Olanzapine tried with SE dizziness and HA after a month.  Without much benefit over mirtazapine. Overall ok.  Still some anxiety which doesn't want to eat but doing ok. Mirtazapine 7.5 mg HS helps. Sleep good.   Started seing [employer], good. About the same with mood  with some depression over heallth problems. Sees surgeon in October about pain problems. No panic but awakens with anxiety and general worry.   Less nausea.  02/27/22 TC: Does not appear that she is receiving any benefit from mirtazapine.  Mentioned in my notes that the obvious antidepressant choice would be duloxetine because it will sometimes help with pain as well as depression and anxiety. Stop mirtazapine and start duloxetine 20 mg capsule 1 daily for 1 week then 2 capsules daily. It will take 2 to 4 weeks to get any benefit from the duloxetine.  I would suggest in the interim that she try something that is stronger to help with nausea and anxiety but is likely to be insufficiently helpful for depression, therefore the need for duloxetine. So in addition to the duloxetine start olanzapine at the lowest dose 2.5 mg tablet 1 nightly which could help with nausea appetite and anxiety almost right away.  If it causes too much sleepiness have her take it 2 to 3 hours before bedtime and she can even cut it in half if she has to. I  will send in both prescriptions.  03/06/22 TC:  On 10/16 you wanted pt to take olanzapine and Cymbalta to replace mirtazapine.She did not start the Cymbalta but did take olanzapine.She had side effects including insomnia,hand tremors,blurred vision and increased back pain.She decided to stop the med.She still wants to try Cymbalta but wants to know if it's ok to take with mirtazapine because she decided to start back taking it.     04/13/22 appointment noted:   Tried olanzapine twice with hand tremor and blurred vision. Back to mirtazapine 15 mg HS.   Less nausea.  Able to gain 6 # but no appetite. Bottomed out in October. Never tried Cymbalta.  Some concern over SE with it. Increased gabapentin to help pain with a little benefit. 300 BID and tolerating. In pain all day.  Tried Nucynta without help.   Mood is better than it was.  Working to accept chronic pain and limited activithy.  04/20/23 TC  Pt said that the cymbalta is making her jittery and more anxious. She has cut the mirtazapine to 7.5mg  not sure if working . Wants something different     MD response:   She is taking the lowest dose of dulxetine and not tolerating it so stop it. I don't have antoher option that will help pain.   If she thinks she is getting some benefit with mirtazapine then continue it.  If not stop it too I would also suggest she come get the [place] genetic test I discussed with her.      06/20/22 appt noted: Mirtazapine helped regain some wt and nausea is better for 6-8 weeks. Currently on 7.5 mg mirtazapine and was up to 15 mg daily. Still some anxiety issues and needs Xanax. Heart burn without Depakote.  It's ok right now.   Peripheral nn stimulator in her back temporary for 2 mos.  No benefit so far.  Chronic pain.   Still seeing Debbie. Not markedly depressed.   Plan: Cont Depakote for reflux.  Depakote ER BRAND helped GI problems which were worse on generic.  But she wants to retry stopping it. Rec continue mirtazapine bc helpful  09/18/22 apt noted: Still taking Depakote ER 250 mg HS.  Reduced mirtazapine to 3.75 mg HS. No N and regained lost wt.  Still some anxiety managed with mirtazapine.  GI is fine now.  Vocal problems.   Chronic back pain.  Accepting of this.  Still exercise.  Finished therapy with Onalee Hua.  Asks about CBD. Plan: Continue Xanax 0.25 mg BID for panic and GAD , but option increase TID bc helps nausea. Depakote ER BRAND helped GI  problems which were worse on generic.  But she wants to retry stopping it. Option continue or stop mirtazapine  02/14/2023 appointment noted: Meds: Alprazolam 0.25 mg 3 times daily as needed anxiety usu just 0.125 AM, Depakote ER 250 daily, gabapentin 300 mg 3 times daily, mirtazapine 3.75 mg nightly.  Takig hydrocodone and muscle relaxer. SE no cog px. Decided to continue meds for now. When tried to stop Depakote had more acid reflux. No CBD bc got sleepy.  Still CBP.  GI ok.   Sleep great. Anxiety is controlled without panic except rarely. 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  08/15/23 appt noted: Med: mirtazapine 7.5 mg HS, Alprazolam 0.25 mg 3 times daily as needed anxiety usu just 0.125 AM, Depakote ER 250 daily, gabapentin 300 mg 3 times daily.   Takig hydrocodone and muscle relaxer.  Larey Seat in Nov with secondary EMA with anxiety and more pain. .   Norovirus feb messed up GI and caused 10# wt loss.  So increased mirtazapine 7.5 mg HS for a couple of weeks. With alprazolam only taking 0.125 or 0.25 prn anxiety. Doc friend rec rial amitriptyline for pain. More down bc chronic pain.  Done all the PT  and just hurts so bad.    Saw neurologist for Migraine and had problems with the meds RX.  Aimovig has helped..   Back surgeries Cervical 10 y ago.  Latest for scoliosis but didn't get pain relief.    Past Psychiatric Medication Trials: Failed multiple other antidepressants,  Celexa chest pain Duloxetine SE Nortriptyline hyper Hydroxyzine SE  nefazodone & Depakote for migraine,  Depakote brand ER 250 helps heartburn. Generic failed. Mirtazapine 7.5 mg benefit Olanzapine 2.5 mg HS with SE dizziness and HA citalopram palpitations Trazodone Clonazepam did not help anxiety, alprazolam helps anxiety and sleep  Review of Systems:  Review of Systems  Constitutional:  Positive for appetite change.  Gastrointestinal:  Negative for abdominal pain and nausea.   Musculoskeletal:  Positive for arthralgias and back pain.  Neurological:  Negative for tremors.  Psychiatric/Behavioral:  Negative for agitation, behavioral problems, confusion, decreased concentration, dysphoric mood, hallucinations, self-injury, sleep disturbance and suicidal ideas. The patient is nervous/anxious. The patient is not hyperactive.     Medications: I have reviewed the patient's current medications.  Current Outpatient Medications  Medication Sig Dispense Refill   AIMOVIG 140 MG/ML SOAJ INJECT 140 MG INTO SKIN EVERY 30 DAYS 3 mL 3   ALPRAZolam (XANAX) 0.25 MG tablet Take 1 tablet (0.25 mg total) by mouth 3 (three) times daily as needed for anxiety. (Patient taking differently: Take 0.25 mg by mouth 3 (three) times daily as needed for anxiety. Usually 0.25 mg prn) 270 tablet 1   amitriptyline (ELAVIL) 10 MG tablet Take 1 tablet (10 mg total) by mouth at bedtime. 90 tablet 0   Ascorbic Acid (VITAMIN C PO) Take 500 mg by mouth daily.     Calcium Carbonate (CALCIUM 600 PO) Take 600-1,200 mg by mouth daily.     cholecalciferol (VITAMIN D3) 25 MCG (1000 UNIT) tablet Take 1,000 Units by mouth daily.     denosumab (PROLIA) 60 MG/ML SOSY injection      DEPAKOTE ER 250 MG 24 hr tablet TAKE 1 TABLET BY MOUTH DAILY 30 tablet 11   famotidine (PEPCID) 40 MG tablet TAKE 1 TABLET BY MOUTH DAILY 30 tablet 3   gabapentin (NEURONTIN) 300 MG capsule Take 300 mg by mouth every evening. 1 tab TID     HYDROcodone-acetaminophen (NORCO) 7.5-325 MG tablet Take 1.5 tablets by mouth every 6 (six) hours as needed for moderate pain.     Lactobacillus (ACIDOPHILUS) 100 MG CAPS Take 100 mg by mouth daily.     methocarbamol (ROBAXIN) 750 MG tablet Take 750 mg by mouth 3 (three) times daily.     mirtazapine (REMERON) 7.5 MG tablet Take 0.5 tablets (3.75 mg total) by mouth at bedtime. (Patient taking differently: Take 7.5 mg by mouth at bedtime.) 90 tablet 1   Multiple Vitamin (MULTIVITAMIN) tablet Take 1  tablet by mouth daily.     polyethylene glycol (MIRALAX / GLYCOLAX) 17 g packet Take 17 g by mouth daily.     PREVIDENT 5000 SENSITIVE 1.1-5 % GEL Place onto teeth.     Current Facility-Administered Medications  Medication Dose Route Frequency Provider Last Rate Last Admin   [START  ON 12/31/2023] denosumab (PROLIA) injection 60 mg  60 mg Subcutaneous Once Carlus Pavlov, MD        Medication Side Effects: None  Allergies:  Allergies  Allergen Reactions   Penicillins Other (See Comments)    Seizures as a child   Atarax [Hydroxyzine] Other (See Comments)    Headache, depression   Bactrim [Sulfamethoxazole-Trimethoprim] Nausea Only   Celexa [Citalopram Hydrobromide] Other (See Comments)    Chest pain   Erythromycin      Upset stomach   Flagyl [Metronidazole]     Dizzy and increased heart rate   Nortriptyline Itching   Orphenadrine     Hand tremors   Prilosec [Omeprazole]     Abdominal pain   Requip [Ropinirole]     Made sx worse   Darvocet [Propoxyphene N-Acetaminophen] Itching   Percocet [Oxycodone-Acetaminophen] Itching    Past Medical History:  Diagnosis Date   Allergy    Anxiety    Arthritis    Cervical dysplasia    Diverticulitis    Endometriosis    Fatigue    Gallstones    GERD (gastroesophageal reflux disease)    History of hiatal hernia    pt states she currently has a hiatal hernia   HSV infection    oral   IBS (irritable bowel syndrome)    Migraine    Osteopenia    Osteoporosis 01/2019   T score -3.1   Osteoporosis    Plantar fasciitis    PONV (postoperative nausea and vomiting)    also difficult to wake up   Recurrent vaginitis    Reflux    Scoliosis    Wears glasses     Family History  Problem Relation Age of Onset   Cancer Father        lymphoma   Other Mother        bipolar,reflux   Bipolar disorder Mother    Other Brother        sinus problems   Cancer Maternal Aunt        uterine cancer   Breast cancer Maternal Aunt         40's   Diabetes Maternal Aunt    Cancer Paternal Aunt        Colon cancer   Breast cancer Cousin 50       Mat. 1st cousin    Social History   Socioeconomic History   Marital status: Married    Spouse name: Peyton Najjar   Number of children: 0   Years of education: 16   Highest education level: Not on file  Occupational History    Comment: [employer]  Tobacco Use   Smoking status: Former    Current packs/day: 0.00    Types: Cigarettes    Quit date: 05/17/1983    Years since quitting: 40.2   Smokeless tobacco: Never  Vaping Use   Vaping status: Never Used  Substance and Sexual Activity   Alcohol use: Not Currently   Drug use: No   Sexual activity: Not Currently    Birth control/protection: Post-menopausal    Comment: intercourse age 75 , sexual partners more than 5  Other Topics Concern   Not on file  Social History Narrative   Pt is married, no children.  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    Caffeine- very little.   Right handed   Lives at home with her husband   Social Drivers of Corporate investment banker Strain: Not on  file  Food Insecurity: Not on file  Transportation Needs: Not on file  Physical Activity: Not on file  Stress: Not on file  Social Connections: Not on file  Intimate Partner Violence: Not on file    Past Medical History, Surgical history, Social history, and Family history were reviewed and updated as appropriate.   Please see review of systems for further details on the patient's review from today.   Objective:   Physical Exam:  There were no vitals taken for this visit.  Physical Exam Constitutional:      General: She is not in acute distress. Musculoskeletal:        General: No deformity.  Neurological:     Mental Status: She is alert and oriented to person, place, and time.     Cranial Nerves: No dysarthria.     Coordination: Coordination normal.  Psychiatric:        Attention and  Perception: Attention and perception normal. She does not perceive auditory or visual hallucinations.        Mood and Affect: Mood is anxious. Mood is not depressed. Affect is not labile, blunt or tearful.        Speech: Speech normal.        Behavior: Behavior normal. Behavior is cooperative.        Thought Content: Thought content normal. Thought content is not paranoid or delusional. Thought content does not include homicidal or suicidal ideation. Thought content does not include suicidal plan.        Cognition and Memory: Cognition and memory normal.        Judgment: Judgment normal.     Comments: Insight intact Some residual anxiety chronically       Lab Review:     Component Value Date/Time   NA 140 08/31/2021 1100   NA 134 03/26/2020 1056   K 4.4 08/31/2021 1100   CL 103 08/31/2021 1100   CO2 31 08/31/2021 1100   GLUCOSE 98 08/31/2021 1100   BUN 7 (L) 08/31/2021 1100   BUN 8 03/26/2020 1056   CREATININE 0.73 08/31/2021 1100   CALCIUM 9.4 08/31/2021 1100   PROT 6.9 08/31/2021 1100   ALBUMIN 4.0 08/31/2021 1100   AST 18 08/31/2021 1100   ALT 13 08/31/2021 1100   ALKPHOS 36 (L) 08/31/2021 1100   BILITOT 0.6 08/31/2021 1100   GFRNONAA >60 08/31/2021 1100   GFRAA 91 03/26/2020 1056       Component Value Date/Time   WBC 4.2 08/31/2021 1100   RBC 4.18 08/31/2021 1100   HGB 12.8 08/31/2021 1100   HCT 40.1 08/31/2021 1100   PLT 205 08/31/2021 1100   MCV 95.9 08/31/2021 1100   MCH 30.6 08/31/2021 1100   MCHC 31.9 08/31/2021 1100   RDW 12.4 08/31/2021 1100   LYMPHSABS 0.7 12/21/2019 0949   MONOABS 0.6 12/21/2019 0949   EOSABS 0.0 12/21/2019 0949   BASOSABS 0.0 12/21/2019 0949    Normal liver enzymes in September.  No results found for: "POCLITH", "LITHIUM"   No results found for: "PHENYTOIN", "PHENOBARB", "VALPROATE", "CBMZ"   .res Assessment: Plan:    Clarene was seen today for follow-up, anxiety, sleeping problem and stress.  Diagnoses and all orders for  this visit:  Generalized anxiety disorder  Panic disorder without agoraphobia  Major depressive disorder, recurrent episode, moderate (HCC) -     amitriptyline (ELAVIL) 10 MG tablet; Take 1 tablet (10 mg total) by mouth at bedtime.  Migraine without aura and without status migrainosus,  not intractable  Insomnia due to mental condition  Gastroesophageal reflux disease without esophagitis  Nausea without vomiting  Chronic midline back pain, unspecified back location -     amitriptyline (ELAVIL) 10 MG tablet; Take 1 tablet (10 mg total) by mouth at bedtime.   30 min non face to face time with patient was spent on counseling and coordination of care. We discussed the following. Mood is better overall in the last months.   Was Much better with with anxiety, appetite and sleep on mirtazapine 3.75 mg tablet 1 nightly initially.  Nausea, depression and anxiety Are back again.  she can stop it if desired.  Extensive discussion poss TCA for chronic back pain with low quality of life.   DC mirtazapine  Trial amitriptyline 10 mg HS for pain  Cont vs consider stopping it.  On 250 mg HS.  Depakote ER BRAND helped GI problems which were worse on generic.  But she wants to retry stopping it.  Supportive therapy dealing with health problems.  Back pain ongoing.    Answered questions about CBD. Option low dose Fetzima 20 for pain.  We discussed the short-term risks associated with benzodiazepines including sedation and increased fall risk among others.  Discussed long-term side effect risk including dependence, potential withdrawal symptoms, and the potential eventual d  ose-related risk of dementia. Disc newer studies that cast in doubt the relationship with dementia.  Not drowsy with alprazolam. Disc warnings about combo of Bz and opiates not relevant at this low dose. BC morning anxiety could try switching to Valium and she doesn't want to try it. Continue Xanax 0.25 mg BID for panic and GAD ,  but option increase TID bc helps nausea.  Consider Genesight testing bc med sensitivity and failures. ACTX partially reviewed but site is poorly designed and results difficult to see.  Disc this in detail available.  But increase risk SJS with CBZ and oxcarb  FU 2 mos  Meredith Staggers, MD, DFAPA  Please see After Visit Summary for patient specific instructions.  Future Appointments  Date Time Provider Department Center  09/03/2023  9:00 AM Mathis Fare, LCSW [facility] None  09/06/2023  8:50 AM Jenel Lucks, MD [facility]  10/24/2023 11:00 AM Shawnie Dapper, NP [facility] None  11/20/2023 10:00 AM Carlus Pavlov, MD [facility] None  01/01/2024  2:00 PM [facility] NURSE [facility] None  03/24/2024 11:20 AM Carlus Pavlov, MD [facility] None    No orders of the defined types were placed in this encounter.     -------------------------------

## 2023-08-20 ENCOUNTER — Other Ambulatory Visit: Payer: Self-pay | Admitting: Psychiatry

## 2023-08-20 ENCOUNTER — Telehealth: Payer: Self-pay | Admitting: Psychiatry

## 2023-08-20 DIAGNOSIS — F3342 Major depressive disorder, recurrent, in full remission: Secondary | ICD-10-CM

## 2023-08-20 DIAGNOSIS — F5105 Insomnia due to other mental disorder: Secondary | ICD-10-CM

## 2023-08-20 DIAGNOSIS — F410 Panic disorder [episodic paroxysmal anxiety] without agoraphobia: Secondary | ICD-10-CM

## 2023-08-20 MED ORDER — MIRTAZAPINE 7.5 MG PO TABS: ORAL_TABLET | ORAL | 0 refills | Status: DC

## 2023-08-20 NOTE — Telephone Encounter (Signed)
 Pt last seen 4/2.  DC mirtazapine  Trial amitriptyline 10 mg HS for pain  She said she initially took 1/2 tablet of amitriptyline and then increased it for 3 days to a full tablet. She reports it increased her anxiety and didn't help with pain. She knows that she probably hasn't given it long enough, but with the increased anxiety doesn't feel it is the way to go. She is asking to go back to mirtazapine 7.5 mg - 1.5 tabs instead. She doesn't need a RF now.

## 2023-08-20 NOTE — Telephone Encounter (Signed)
 Made requested med changes

## 2023-08-20 NOTE — Telephone Encounter (Signed)
 Pt called 11:11 requesting to go back on Mirtazapine 7.5 mg asking to write Rx as 7.5 mg take 1.5 @ night. Was taking before triying Amitriptyline. Did not work too much anxiety. Contact [phone] [facility]

## 2023-08-21 MED ORDER — MIRTAZAPINE 7.5 MG PO TABS: ORAL_TABLET | ORAL | 0 refills | Status: DC

## 2023-08-21 NOTE — Telephone Encounter (Signed)
 Patient notified. RF sent to [facility] for mirtazapine for 7.5 - 1-1/2 tabs.

## 2023-08-24 ENCOUNTER — Other Ambulatory Visit: Payer: Self-pay | Admitting: Gastroenterology

## 2023-09-03 ENCOUNTER — Ambulatory Visit: Admitting: Psychiatry

## 2023-09-03 DIAGNOSIS — F411 Generalized anxiety disorder: Secondary | ICD-10-CM | POA: Diagnosis not present

## 2023-09-03 NOTE — Progress Notes (Signed)
 [facility] Counselor/Therapist Progress Note  Patient ID: Yolanda Carroll, MRN: [id number],    Date: 09/03/2023  Time Spent: 55 minutes   Treatment Type: Individual Therapy  Reported Symptoms: anxiety, anger, frustration, hopelessness, depression   Mental Status Exam:  Appearance:   Casual and Neat     Behavior:  Appropriate, Sharing, and Motivated  Motor:  Normal  Speech/Language:   Clear and Coherent  Affect:  Depressed and anxiety  Mood:  anxious, depressed, and irritable  Thought process:  goal directed  Thought content:    Rumination  Sensory/Perceptual disturbances:    WNL  Orientation:  oriented to person, place, time/date, situation, day of week, month of year, year, and stated date of September 03, 2023  Attention:  Good  Concentration:  Fair  Memory:  WNL  Fund of knowledge:   Good  Insight:    Good  Judgment:   Good  Impulse Control:  Good   Risk Assessment:  (Answers "no", but admits some days are better than others but that she would call if she felt she were going to harm herself.  She also confirmed that if she felt she was losing control and could potentially harm herself, she would first try calling our office and speaking with someone, or go to behavioral health.  Adds that she is wanting to better deal with this situation she is in with her the goal pain and has an upcoming appointment with pain management Dr.)  Danger to Self:  No Self-injurious Behavior: No Danger to Others: No Duty to Warn:no Physical Aggression / Violence:No  Access to Firearms a concern: No  Gang Involvement:No   Subjective:  Patient today in session and reporting her back pain has worsened again and needing session today to give more voice to her pain and receive support. Is dealing with more anger as well as trying to encourage herself based on suggestions from various med providers, hoping to make good decisions about her back situation and whether to have further surgery or  not.  Feeling more discouraged today and needed the session to talk through her frustrations, anger, anxiety, and her need to make clear decisions with her doctor about any further treatment or not as she does not feel it is really helped.  Is not sensing any of the prior gains that she felt she had made much earlier in her treatment for her back.  Relying on friends and her faith at this point and both get stretched at times per her report.  Does have 1 really good friend that she feels understands and who also has some pain issues and they communicate daily.  States that "devotions sometimes help".  Trying to use more positive self talk but that is difficult.  He is thankful for her friends both inside and out of church.  Venting her anger and frustration seems to be more the most important things for patient in today's session and she did this consistently, which I think was helpful to her.  I encouraged her outside of sessions to keep venting and to use her journaling as a tool.  Interventions: Cognitive Behavioral Therapy and Ego-Supportive  Long-term goal:  Elevate mood and show evidence of usual energy, activities, and socialization level. Short-term goal: Verbally identify the source of depressed mood and work to decrease its impact. Strategies: Identify cognitive self-talk that tends to support depression.  Verbalize hopeful and positive statements regarding her has not and future   Diagnosis:  ICD-10-CM   1. Generalized anxiety disorder  F41.1      Plan: Patient today actively participating in session and working on her anxiety, depression, anger, frustration, and some hopelessness.  (See note above about patient's agreement to contact our office or hospital if she felt she was going to harm herself in any way).  She worked well in session today vented a lot about her pain and not many options that have been very helpful.  Good motivation and participation in session today.  Also good  venting of her frustrations and trying to.  Also shares how in certain positions she is not experiencing pain or as much pain and how to choose those positions more often.  Shared how she does enjoy certain activities with her friends but is discouraged that medications and all procedures have gotten rid of her pain.  Staying in touch with good friends and sometimes tires of people asking her about her pain.  Trying to be optimistic but discouraging that doctors have not been able to help her get rid of the pain or it being more manageable.  Has continued to use some breathing exercises, maintains contact at her church, PT, helpful books, use of mindfulness, and staying connected with friends who are supportive and helpful to her.  Goal review and progress/challenges noted with patient.  Next appointment within 3 weeks.   Kelleen Patee, LCSW

## 2023-09-06 ENCOUNTER — Encounter: Payer: Self-pay | Admitting: Gastroenterology

## 2023-09-06 ENCOUNTER — Ambulatory Visit (INDEPENDENT_AMBULATORY_CARE_PROVIDER_SITE_OTHER): Payer: Medicare Other | Admitting: Gastroenterology

## 2023-09-06 VITALS — BP 118/62 | HR 77 | Ht 68.0 in | Wt 129.0 lb

## 2023-09-06 DIAGNOSIS — R634 Abnormal weight loss: Secondary | ICD-10-CM

## 2023-09-06 DIAGNOSIS — R159 Full incontinence of feces: Secondary | ICD-10-CM

## 2023-09-06 DIAGNOSIS — R490 Dysphonia: Secondary | ICD-10-CM | POA: Diagnosis not present

## 2023-09-06 DIAGNOSIS — R151 Fecal smearing: Secondary | ICD-10-CM

## 2023-09-06 DIAGNOSIS — K589 Irritable bowel syndrome without diarrhea: Secondary | ICD-10-CM | POA: Diagnosis not present

## 2023-09-06 DIAGNOSIS — Z1211 Encounter for screening for malignant neoplasm of colon: Secondary | ICD-10-CM

## 2023-09-06 DIAGNOSIS — K581 Irritable bowel syndrome with constipation: Secondary | ICD-10-CM

## 2023-09-06 DIAGNOSIS — K219 Gastro-esophageal reflux disease without esophagitis: Secondary | ICD-10-CM

## 2023-09-06 MED ORDER — NA SULFATE-K SULFATE-MG SULF 17.5-3.13-1.6 GM/177ML PO SOLN
1.0000 | Freq: Once | ORAL | 0 refills | Status: AC
Start: 2023-09-06 — End: 2023-09-06

## 2023-09-06 NOTE — Progress Notes (Unsigned)
 HPI : Yolanda Carroll is a 68 y.o. female with a history of anxiety, depression, endometriosis, osteoporosis, GERD and IBS who is referred to us  by Olin Bertin, MD.  I initially saw the patient in 2023 with persistent symptoms of nausea and poor appetite that were persisting following a spine surgery.  It was thought the possibly these were related to her pain medications, and her symptoms were starting to improve at the time of that visit.  At that visit, her lansoprazole was transitioned to famotidine  due to her osteoporosis, she was continued on MiraLAX for chronic constipation and she was recommended to repeat screening colonoscopy in 2025.  Seepage a few months, occasional, at night.       Flexible sigmoidoscopy (Dr. Dellis Fermo) March 25, 2020 Indication: Rectal hemorrhage Normal colon, nonbleeding internal hemorrhoids, presumed to be source of patient's recent bleeding   EGD Sep 16, 2019 (Dr. Dellis Fermo) Indication: Esophageal reflux, weight loss Normal esophagus, normal stomach, normal duodenum Biopsies: Chronic inactive gastritis, normal duodenal biopsies, normal esophageal biopsies   EGD March 25, 2020 (Dr. Dellis Fermo) Indication: Nausea Normal esophagus, normal stomach, normal duodenum Normal biopsies   Colonoscopy July 22, 2013 (Dr. Dellis Fermo) Sigmoid diverticulosis, 4 small polyps in the ascending colon, removed with forceps Pathology consistent with lymphoid aggregates Recommended repeat colonoscopy in 10 years  Past Medical History:  Diagnosis Date   Allergy    Anxiety    Arthritis    Cervical dysplasia    Diverticulitis    Endometriosis    Fatigue    Gallstones    GERD (gastroesophageal reflux disease)    History of hiatal hernia    pt states she currently has a hiatal hernia   HSV infection    oral   IBS (irritable bowel syndrome)    Migraine    Osteopenia    Osteoporosis 01/2019   T score -3.1   Osteoporosis    Plantar fasciitis    PONV  (postoperative nausea and vomiting)    also difficult to wake up   Recurrent vaginitis    Reflux    Scoliosis    Wears glasses      Past Surgical History:  Procedure Laterality Date   ANTERIOR CERVICAL DECOMP/DISCECTOMY FUSION  04/17/2011   Procedure: ANTERIOR CERVICAL DECOMPRESSION/DISCECTOMY FUSION 2 LEVELS;  Surgeon: Corrina Dimitri;  Location: [facility];  Service: Neurosurgery;  Laterality: N/A;  Cervical five-six, six-seven anterior cervical decompression with fusion,  plating,  and bonegraft    APPENDECTOMY  1978   BACK SURGERY N/A    2022   BREAST BIOPSY  07/13/2011   Procedure: BREAST BIOPSY WITH NEEDLE LOCALIZATION;  Surgeon: Enid Harry, MD;  Location: [facility];  Service: General;  Laterality: Right;  Right breast wire localization biopsy   BREAST EXCISIONAL BIOPSY Right 2013   BREAST SURGERY     Breast Bx-Benign   CHOLECYSTECTOMY N/A 09/08/2021   Procedure: LAPAROSCOPIC CHOLECYSTECTOMY;  Surgeon: Sim Dryer, MD;  Location: [facility];  Service: General;  Laterality: N/A;   CYSTOSCOPY     GYNECOLOGIC CRYOSURGERY     HERNIA REPAIR  08/02/1995   RIH   LAPAROSCOPIC ENDOMETRIOSIS FULGURATION  1997   PELVIC LAPAROSCOPY     ROTATOR CUFF REPAIR     right 2002 left 2000   Family History  Problem Relation Age of Onset   Cancer Father        lymphoma   Other Mother        bipolar,reflux  Bipolar disorder Mother    Other Brother        sinus problems   Cancer Maternal Aunt        uterine cancer   Breast cancer Maternal Aunt        40's   Diabetes Maternal Aunt    Cancer Paternal Aunt        Colon cancer   Breast cancer Cousin 50       Mat. 1st cousin   Social History   Tobacco Use   Smoking status: Former    Current packs/day: 0.00    Types: Cigarettes    Quit date: 05/17/1983    Years since quitting: 40.3   Smokeless tobacco: Never  Vaping Use   Vaping status: Never Used  Substance Use Topics   Alcohol use: Not Currently    Drug use: No   Current Outpatient Medications  Medication Sig Dispense Refill   AIMOVIG  140 MG/ML SOAJ INJECT 140 MG INTO SKIN EVERY 30 DAYS 3 mL 3   ALPRAZolam  (XANAX ) 0.25 MG tablet Take 1 tablet (0.25 mg total) by mouth 3 (three) times daily as needed for anxiety. (Patient taking differently: Take 0.25 mg by mouth 3 (three) times daily as needed for anxiety. Usually 0.25 mg prn) 270 tablet 1   Ascorbic Acid (VITAMIN C PO) Take 500 mg by mouth daily.     Calcium Carbonate (CALCIUM 600 PO) Take 600-1,200 mg by mouth daily.     cholecalciferol (VITAMIN D3) 25 MCG (1000 UNIT) tablet Take 1,000 Units by mouth daily.     denosumab  (PROLIA ) 60 MG/ML SOSY injection      DEPAKOTE  ER 250 MG 24 hr tablet TAKE 1 TABLET BY MOUTH DAILY 30 tablet 11   famotidine  (PEPCID ) 40 MG tablet TAKE 1 TABLET BY MOUTH DAILY 30 tablet 0   gabapentin  (NEURONTIN ) 300 MG capsule Take 300 mg by mouth every evening. 1 tab TID     HYDROcodone -acetaminophen  (NORCO) 7.5-325 MG tablet Take 1.5 tablets by mouth every 6 (six) hours as needed for moderate pain.     Lactobacillus (ACIDOPHILUS) 100 MG CAPS Take 100 mg by mouth daily.     methocarbamol  (ROBAXIN ) 750 MG tablet Take 750 mg by mouth 3 (three) times daily.     mirtazapine  (REMERON ) 7.5 MG tablet 1 and 1/2 tablets at night. 135 tablet 0   Multiple Vitamin (MULTIVITAMIN) tablet Take 1 tablet by mouth daily.     polyethylene glycol (MIRALAX / GLYCOLAX) 17 g packet Take 17 g by mouth daily.     PREVIDENT 5000 SENSITIVE 1.1-5 % GEL Place onto teeth.     Current Facility-Administered Medications  Medication Dose Route Frequency Provider Last Rate Last Admin   [START ON 12/31/2023] denosumab  (PROLIA ) injection 60 mg  60 mg Subcutaneous Once Gherghe, Cristina, MD       Allergies  Allergen Reactions   Penicillins Other (See Comments)    Seizures as a child   Atarax  [Hydroxyzine ] Other (See Comments)    Headache, depression   Bactrim  [Sulfamethoxazole -Trimethoprim ] Nausea  Only   Celexa [Citalopram Hydrobromide] Other (See Comments)    Chest pain   Erythromycin      Upset stomach   Flagyl  [Metronidazole ]     Dizzy and increased heart rate   Nortriptyline Itching   Orphenadrine     Hand tremors   Prilosec [Omeprazole]     Abdominal pain   Requip [Ropinirole]     Made sx worse   Darvocet [Propoxyphene N-Acetaminophen ] Itching  Percocet [Oxycodone -Acetaminophen ] Itching     Review of Systems: All systems reviewed and negative except where noted in HPI.    No results found.  Physical Exam: There were no vitals taken for this visit. Constitutional: Pleasant,well-developed, ***female in no acute distress. HEENT: Normocephalic and atraumatic. Conjunctivae are normal. No scleral icterus. Neck supple.  Cardiovascular: Normal rate, regular rhythm.  Pulmonary/chest: Effort normal and breath sounds normal. No wheezing, rales or rhonchi. Abdominal: Soft, nondistended, nontender. Bowel sounds active throughout. There are no masses palpable. No hepatomegaly. Extremities: no edema Lymphadenopathy: No cervical adenopathy noted. Neurological: Alert and oriented to person place and time. Skin: Skin is warm and dry. No rashes noted. Psychiatric: Normal mood and affect. Behavior is normal.  CBC    Component Value Date/Time   WBC 4.2 08/31/2021 1100   RBC 4.18 08/31/2021 1100   HGB 12.8 08/31/2021 1100   HCT 40.1 08/31/2021 1100   PLT 205 08/31/2021 1100   MCV 95.9 08/31/2021 1100   MCH 30.6 08/31/2021 1100   MCHC 31.9 08/31/2021 1100   RDW 12.4 08/31/2021 1100   LYMPHSABS 0.7 12/21/2019 0949   MONOABS 0.6 12/21/2019 0949   EOSABS 0.0 12/21/2019 0949   BASOSABS 0.0 12/21/2019 0949    CMP     Component Value Date/Time   NA 140 08/31/2021 1100   NA 134 03/26/2020 1056   K 4.4 08/31/2021 1100   CL 103 08/31/2021 1100   CO2 31 08/31/2021 1100   GLUCOSE 98 08/31/2021 1100   BUN 7 (L) 08/31/2021 1100   BUN 8 03/26/2020 1056   CREATININE 0.73  08/31/2021 1100   CALCIUM 9.4 08/31/2021 1100   PROT 6.9 08/31/2021 1100   ALBUMIN 4.0 08/31/2021 1100   AST 18 08/31/2021 1100   ALT 13 08/31/2021 1100   ALKPHOS 36 (L) 08/31/2021 1100   BILITOT 0.6 08/31/2021 1100   GFRNONAA >60 08/31/2021 1100   GFRAA 91 03/26/2020 1056       Latest Ref Rng & Units 08/31/2021   11:00 AM 12/21/2019    9:49 AM 02/21/2012    5:46 PM  CBC EXTENDED  WBC 4.0 - 10.5 K/uL 4.2  3.4  5.4   RBC 3.87 - 5.11 MIL/uL 4.18  3.95  4.35   Hemoglobin 12.0 - 15.0 g/dL 40.9  81.1  91.4   HCT 36.0 - 46.0 % 40.1  37.0  39.6   Platelets 150 - 400 K/uL 205  160  213   NEUT# 1.7 - 7.7 K/uL  2.0  2.1   Lymph# 0.7 - 4.0 K/uL  0.7  2.5       ASSESSMENT AND PLAN:  Olin Bertin, MD

## 2023-09-06 NOTE — Patient Instructions (Signed)
 You have been scheduled for a colonoscopy. Please follow written instructions given to you at your visit today.   If you use inhalers (even only as needed), please bring them with you on the day of your procedure.  DO NOT TAKE 7 DAYS PRIOR TO TEST- Trulicity (dulaglutide) Ozempic, Wegovy (semaglutide) Mounjaro (tirzepatide) Bydureon Bcise (exanatide extended release)  DO NOT TAKE 1 DAY PRIOR TO YOUR TEST Rybelsus (semaglutide) Adlyxin (lixisenatide) Victoza (liraglutide) Byetta (exanatide) ___________________________________________________________________________  Start Metamucil daily.  Start Miralax as needed.  Try IB gard samples.  _______________________________________________________  If your blood pressure at your visit was 140/90 or greater, please contact your primary care physician to follow up on this.  _______________________________________________________  If you are age 16 or older, your body mass index should be between 23-30. Your Body mass index is 19.61 kg/m. If this is out of the aforementioned range listed, please consider follow up with your Primary Care Provider.  If you are age 11 or younger, your body mass index should be between 19-25. Your Body mass index is 19.61 kg/m. If this is out of the aformentioned range listed, please consider follow up with your Primary Care Provider.   ________________________________________________________  The [facility] providers would like to encourage you to use MYCHART to communicate with providers for non-urgent requests or questions.  Due to long hold times on the telephone, sending your provider a message by [facility] may be a faster and more efficient way to get a response.  Please allow 48 business hours for a response.  Please remember that this is for non-urgent requests.  _______________________________________________________   It was a pleasure to see you today!  Thank you for trusting me with your  gastrointestinal care!

## 2023-09-24 ENCOUNTER — Ambulatory Visit: Admitting: Psychiatry

## 2023-09-24 ENCOUNTER — Telehealth: Payer: Self-pay | Admitting: Psychiatry

## 2023-09-24 NOTE — Telephone Encounter (Signed)
 Pt called stating that her pain doctor has put her on moriphine 15 mg. Just wanted dr. Toi Foster to be aware of this

## 2023-09-24 NOTE — Telephone Encounter (Signed)
 Pt wanted to let you know pain MD has put her on morphine .   09/20/2023 09/19/2023 1  Morphine  Sulf Er 15 Mg Tablet 60.00 30 Da Eic [id number] Fri (6540) 0/0 30.00 MME Medicare [place] 09/17/2023 08/15/2023 1  Hydrocodone -Acetamin 10-325 Mg 120.00 30 Sa Des [id number] Fri (6540) 0/0 40.00 MME Medicare [place] 09/12/2023 05/17/2023 1  Gabapentin  300 Mg Capsule 90.00 30 Sa Des

## 2023-09-25 ENCOUNTER — Ambulatory Visit (INDEPENDENT_AMBULATORY_CARE_PROVIDER_SITE_OTHER): Admitting: Psychiatry

## 2023-09-25 DIAGNOSIS — F411 Generalized anxiety disorder: Secondary | ICD-10-CM

## 2023-09-25 NOTE — Progress Notes (Signed)
 [facility] Counselor/Therapist Progress Note  Patient ID: Yolanda Carroll, MRN: [id number],    Date: 09/25/2023  Time Spent: 53 minutes   Treatment Type: Individual Therapy  Reported Symptoms: anxiety, depression, denies hopelessness, frustration   Mental Status Exam:  Appearance:   Neat     Behavior:  Appropriate, Sharing, and some motivation although difficult at times.  Motor:  Impacted by her back issues  Speech/Language:   Clear and Coherent  Affect:  Depressed and anxious  Mood:  angry, anxious, depressed, and irritable  Thought process:  goal directed  Thought content:    Rumination  Sensory/Perceptual disturbances:    WNL  Orientation:  oriented to person, place, time/date, situation, day of week, month of year, year, and stated date of Sep 25, 2023  Attention:  Good  Concentration:  Good  Memory:  WNL  Fund of knowledge:   Good  Insight:    Good  Judgment:   Good  Impulse Control:  Good   Risk Assessment: Danger to Self:  No Self-injurious Behavior: No Danger to Others: No Duty to Warn:no Physical Aggression / Violence:No  Access to Firearms a concern: No  Gang Involvement:No   Denies any thoughts to harm self nor others and states some days are better than others.  States that she would call or go to hospital for help if ever feeling she was going to harm herself.    Subjective:  Patient in session today reporting anxiety and some depression. Still struggling with back pain. Choosing not to  do further surgery right now. Surgeon did recommend morphine  extended release and patient has begun taking it for past 2 days, with no negative effects. Does not feel it has helped her pain yet but trying to give it some time and will see pain management Dr again within couple weeks.  "Was anxious about adding another medicine but hasn't made me sick." Stated she did sleep well last night. "I need to figure out ways to retrain my thought patterns to be more  positive". Did follow through on strategies we discussed in our last session. Difficulty staying in touch with people at times but is doing so and that helps. Looking at activities she can do without much pain. Is asking more for help and companionship with friends as needed. Hoping the morphine  will begin to help. Find prayers helpful including some on websites that she spoke about today, and helps redirect her thoughts. Going walking helps. Is hoping most recent med added, will help.  Does have 1 really good friend that she is in touch with daily and that continues to be a good support for her.  Venting her frustration helps as she did in session today and she is encouraged to continue venting outside of sessions and practicing some of the coping tools we spoke about today and especially journaling as a tool and releasing some of her anger and frustration.  Interventions: Cognitive Behavioral Therapy and Ego-Supportive  Long-term goal:  Elevate mood and show evidence of usual energy, activities, and socialization level. Short-term goal: Verbally identify the source of depressed mood and work to decrease its impact. Strategies: Identify cognitive self-talk that tends to support depression.  Verbalize hopeful and positive statements regarding her has not and future    Diagnosis:   ICD-10-CM   1. Generalized anxiety disorder  F41.1      Plan:  Patient today showing good effort and participation in session working further on her anxiety, anger,  frustration, depression, and trying to use the tools and friend support system she has to feel more encouraged.  Is hoping recent medication added by her doctor will help the pain more effectively.  Encouraged breathing exercises also, mindfulness, PT that has felt helpful, in addition to her friend contacts.  Gia Sees is continuing to work with her goal-directed behaviors as she deals with back pain that has been difficult to improve.  She is trying to  be more hopeful that this latest medication that her doctor added will proved to be of benefit.  Goal review and progress/challenges noted with patient.  Next appointment within 3 weeks.   Kelleen Patee, LCSW

## 2023-09-25 NOTE — Telephone Encounter (Signed)
Noted ok. 

## 2023-10-15 ENCOUNTER — Encounter: Payer: Self-pay | Admitting: Psychiatry

## 2023-10-15 ENCOUNTER — Ambulatory Visit: Admitting: Psychiatry

## 2023-10-15 ENCOUNTER — Telehealth: Admitting: Psychiatry

## 2023-10-15 DIAGNOSIS — F411 Generalized anxiety disorder: Secondary | ICD-10-CM

## 2023-10-15 DIAGNOSIS — F3342 Major depressive disorder, recurrent, in full remission: Secondary | ICD-10-CM

## 2023-10-15 DIAGNOSIS — F410 Panic disorder [episodic paroxysmal anxiety] without agoraphobia: Secondary | ICD-10-CM

## 2023-10-15 DIAGNOSIS — G43009 Migraine without aura, not intractable, without status migrainosus: Secondary | ICD-10-CM

## 2023-10-15 DIAGNOSIS — F5105 Insomnia due to other mental disorder: Secondary | ICD-10-CM

## 2023-10-15 DIAGNOSIS — K219 Gastro-esophageal reflux disease without esophagitis: Secondary | ICD-10-CM

## 2023-10-15 NOTE — Progress Notes (Addendum)
 Yolanda Carroll [id number] 1955/10/18 68 y.o.  Video Visit via My Chart  I connected with pt by video using My Chart and verified that I am speaking with the correct person using two identifiers.   I discussed the limitations, risks, security and privacy concerns of performing an evaluation and management service by My Chart  and the availability of in person appointments. I also discussed with the patient that there may be a patient responsible charge related to this service. The patient expressed understanding and agreed to proceed.  I discussed the assessment and treatment plan with the patient. The patient was provided an opportunity to ask questions and all were answered. The patient agreed with the plan and demonstrated an understanding of the instructions.   The patient was advised to call back or seek an in-person evaluation if the symptoms worsen or if the condition fails to improve as anticipated.  I provided 30 minutes of video time during this encounter.  The patient was located at home and the provider was located office. Session 1030-1100  Subjective:   Patient ID:  Yolanda Carroll is a 68 y.o. (DOB [birth year]) female.  Chief Complaint:  Chief Complaint  Patient presents with   Follow-up   Depression   Anxiety   Stress    pain   Medication Problem    Yolanda Carroll presents to the office today for follow-up of anxiety and migraine.  seen in July 2020.  No meds were changed.  She requires name brand Depakote  because the generic caused worsening headaches.  Retired November 14, 2018.  Was so burned out at work.  Helping with church projects and helping friends.  Focusing on her health and doing PT for back and hip.  No kids or gkids.  Helps care for 82 yo M-in-law.  Yolanda Carroll is also busy which helps.  Given a surprise retirement party.     seen January 2021.  Nefazodone  is no longer manufactured and she had to wean off.  We also discussed the potential of weaning off  Depakote  because Aimovig  had been very effective at managing her migraine headache.  She called August 11, 2019 stating that she did taper off of Depakote  because she did not feel like she needed it any more.  Her last dose was July 23, 2019 but afterwards noticed heartburn and swallowing issues.  Her GI doctor added Pepcid  40 mg daily.  Her neurologist had indicated that Depakote  can sometimes help with esophageal issues and she wondered if that was connected.  She was given the option to restart a lower dose and perhaps taper more slowly.  Off Depakote  for a month.  She elected not to restart it.  08/19/2019 appointment the following is noted: Tinnitus and mild anxiety worse off the Depakote  and with some mind racing esp in the AM.   Most is better. Reflux is worse off Depakote  ER 250 and wondering if she should restart it.   Disc article on Depakote  helping GERD by increasing lower esophageal sphincter tone. Has travelled some with family events.  M-in-law 92 soon.   HA good with Aimovig . Don't do season changes well and more HA in Spring often. Sleep not great lately with leg cramps.  Started more exercise.  Started snoring and Yolanda Carroll is a light sleeper.  Meds help. Plan no med changes.  10/03/2019 phone call with patient asking to restart residual nefazodone  that she has on hand.  She felt that it helped back pain and she  is experiencing more back pain recently.  She is aware that it is no longer manufactured to our knowledge and once she runs out it will not be available.  She indicated she had an off to take half of 150 mg nefazodone  tablet for about 2 to 3 months and would like to do so.  11/05/2019 appointment with the following noted:  Appt moved earlier DT the following. Had stopped Depakote  and nefazadone and reflux got worse.  Restarting Depakote  didn't help much.  Restarted nefazodaone and reflux better right away.  May not need Depakote  but doesn't want to change.  Can't exercise DT back pain  since Jan ans worse since March.  Getting new doctor.  More depressed and anxious and sleep problems DT back pain.   Usually only 0.25 mg alprazolam  at night. Plan: has to stop nefazodone  when it runs out bc no longer manufactured.  Therefore will try trazodone in it's place.  01/07/20 appt with the following noted: She and Yolanda Carroll both got Covid.  Was pretty sick for 10 days.  Lost taste.  Had little resp stuff but severe diarrhea and nausea and couldn't keep fluids down.  No residual sx. Yolanda Carroll had resp sx with pneumonia and had to get O2.  Hosp for 3 days. Still have some nefazodone  and nursing it along. Tried trazodone 50 HS for 2 nights and didn't sleep well and had a HA  Nefazodone  and Depakote  both helped the GERD.  She has enough nefazadone for about 6 weeks. Plan: Nefazodone  helped the reflux and she wants to take it as long as possible.  She didn't have withdrawal off nefazodone . When she runs out then start trazodone.  If that fails then use Viibryd.  Disc SE and alternatives.   Rec try trazodone again but use 100 mg and see if sleeping better prevents the AM HA.  If HA recurs then viibryd. Starter kit.  Depakote  didn't help as much as expected and she might stop it.   02/18/2020 phone call: Yolanda Carroll called to report that she is experiencing high anxiety.  It is causing chest discomfort and breathing problems. Has appt 11/18 and is on the wait list but needs to discuss how to relief this anxiety.  Please call. Response: Note Rtc to patient, she does have the Viibryd and does remember that discussion. She has not been able to start that because she received a steroid injection a couple weeks ago and caused her stomach to be upset. She didn't want to start it knowing that's a side effect. She feels like the steroid injection may have worsened all her symptoms. She did try the trazodone but it was too much for her, even taking a 1/2 tablet. She does have some serzone  left and has been taking that until this  improves. Also taking Xanax  as needed during day if needed. Hoping all of this will improve soon. Informed her I would update Dr. Geoffry with her information.        04/01/2020 appointment with the following noted: Out of nefazodone  a few weeks ago.  More reflux off it.  Tried trazodone a couple of times.  Tried 100 mg trazodone and heart skipped.  50 mg awoke with chest tightness.  Long haul Covid with GI sx.  Nausea and anxiety problems daily since mid September.  Covid early August and got monoclonal infusion which helped.  Headed to long haul Covid clinic in [place] Monday.   Needed to increase alprazolam  to BID.  And 80%  better with that.  Panic some out of bed.  Sx started after steroid shot for her back 9/21.   Hasn't tried Viibryd DT fear of GI worsening.  Poor appetite and lost 15#. Not taken tramadol yet. Migraine under control. Plan: no med changes  06/01/20 appt with following noted: Starting generic Depakote  ER generic today. Started Mirtazapine  3.75 mg and it worked right away for nausea for a week.  Then increased to 7.5 mg HS.  Most days is feeling really well.  No SE except mild drowsiness. Still has post Covid problems with stomach but much better.  Regained 7# of needed weight.  Need to try to gain 10# more. Zofran  failed. Had a lot anxiety with N but it's better now.  Was waking with panic and no longer N and anxiety better too. Severe scoliosis facing big surgery in summer with long recovery.  Can't ride in car for long. Plan: No med changes  08/18/2020 appointment with the following noted: Gained 12# and thankful for mirtazapine . Long Covid GI sx.  Better not gone with mirtazapine . Rare AM panic.  Sleep good. Scoliosis and may require surgery. Can't sit or stand for long Depakote  brand ER 250 helps heartburn. Generic failed. Patient denies difficulty with sleep initiation with Xanax .. Denies appetite disturbance.  Patient reports that energy and motivation have been good.   Patient denies any difficulty with concentration.  Patient denies any suicidal ideation. More depressed and tearful with pain but varies with pain levels.  Good and bad days.  Not too depressed with anxiety worse. Plan: Continue mirtazapine  7.5 mg with Xanax  for GI problems including nausea and appetite.  It has worked some.  Try higher dose. Insomnia managed with  With alprazolam .    11/18/2020 appointment with the following noted: Increased mirtazapine  to 7.5 mg 1 and 1/2 tablet bc  less anxious with it. Facing big back surgery for severe scoliosis 11/30/20.  Can't drive in car for distance or travel. Now on Medicare. Depakote  ER BRAND helped GI problems which were worse on generic. Not markedly depressed.  Patient reports stable mood and denies depressed or irritable moods.   Patient denies difficulty with sleep initiation or maintenance. Denies appetite disturbance.  Patient reports that energy and motivation have been good.  Patient denies any difficulty with concentration.  Patient denies any suicidal ideation. Plan: No med changes Continue Depakote  brand ER 250 mg daily, continue alprazolam  0.25 mg nightly, continue mirtazapine  7.5 mg nightly  04/26/2021 appointment with the following noted: Still on hydrocodone  from surgery on back in July.  Driving.  Healing slowly. Pain before surgery is gone now.  Now pain related to adjusting to rod. But surgery was successful. Ubaldo has been helpful. Has reduced mirtazapine  3.75 mg HS to try to come off it.  Sleep a little worse if doesn't take it.  Stomach problems are better.   No HA in a year but started having migraines in a year.  Migraines started after change in logo of brand Depakote  so restarted Aimovig .  Depakote  also helped acid reflux. Mirtazapine  helped sleep and appetite.  Weight is good.  Sleep is good with it still. Mood is OK. Active at church and people are supportive. Continue Depakote  brand ER 250 mg daily, continue alprazolam   0.25 mg nightly, continue mirtazapine  7.5 mg nightly  08/03/2021 appointment the following noted: Not doing so good.  Was doing great after surgery. A few weeks ago the nausea and anxiety came back. Had reduced mirtazapine  to 3.75  mg HS, back up to 7.5 mg HS about 2 weeks.  No effects so far. Mad and depressed and not me.  Nausea coming back has been upsetting. Pursuing GI work up. Awakens with anxiety.   No sleep trouble. Conc worse. Plan:Was Much better with with anxiety, appetite and sleep on mirtazapine  7.5 mg tablet 1 nightly and relapsed when decreased to 1/tablet nightly,  so increase to 1 &  1/2 of the 7.5 mg tablets HS .   09/27/2021 appointment with the following noted: Last few weeks tough. Cholecystectomy 09/08/21 DT chronic nausea but not better.  Yesterday no nausea for first time in months. Losing weight on bland diet. But it stopped. Chronic back pain after surgery. SE mirtazapine  dry mouth. Usually feels ok in AM but afternoons gets nausea.   Xanax  does help afternoon nausea so maybe it's anxiety. Exhausted from all of this.  Still blames some of it on covid. Not sleepy with Xanax .   Sad bc can't function normally.  Usually not depressed. Sleep is fine. Plan:Was Much better with with anxiety, appetite and sleep on mirtazapine  7.5 mg tablet 1 nightly initially.  Nausea, depression and anxiety Are back again. Option increase to 15 mg .  Yes.  10/26/2021 appointment with the following noted: It helped some.  Still some nausea.  CT scan next week.  Off and on nausea without pattern.  Not clearly anxiety but Xanax  prn does help.  Frustrating that it is not predictable. Tolerated in crease in mirtazapine  15 with mild hangover.  Still some anxiety first thing in AM but not as bad as it was and is worse without Xanax .  No reason for the anxiety.   Not depressed but discouraged.  Tries to stay active and involved. Sleep is great always with Xanax . Chronic opiates DT back surgery  for scoliosis Plan: Continue Xanax  0.25 mg BID for panic and GAD , but option increase TID bc helps nausea.  11/10/21 TC: Pt called reporting CT scan results fine. Requesting as discussed to change Mirtazepine to Olanzapine . MD: As discussed at the last office visit, if her GI work-up did not give a solution to her abdominal problems then it would be reasonable to try olanzapine  at low dose.  I have sent in a prescription for 2.5 mg 1 nightly.  That is the lowest dose made.  If she uses a pill splitter she may try cutting it in half.  1/2 tablet may be sufficient for her.  She could experience benefit within the first day or 2 but give it a week or 2 as long as she is tolerating it to be sure. In order to minimize the risk of daytime sleepiness take the olanzapine  2 to 3 hours before bedtime.  12/15/21 TC: Patient said she had increased the olanzapine  and is still c/o headache and has had some dizziness today. She said she did okay on the mirtazapine . She said you had her on 1-1/2 tabs of 7.5. She said it probably isn't 100%, but felt she did okay otherwise.   01/11/22 appt noted: No solution to GI px.  Was told to get off hydrocodone  but can't DT pain. Olanzapine  tried with SE dizziness and HA after a month.  Without much benefit over mirtazapine . Overall ok.  Still some anxiety which doesn't want to eat but doing ok. Mirtazapine  7.5 mg HS helps. Sleep good.   Started seing [employer], good. About the same with mood  with some depression over heallth problems. Sees surgeon  in October about pain problems. No panic but awakens with anxiety and general worry.   Less nausea.  02/27/22 TC: Does not appear that she is receiving any benefit from mirtazapine .  Mentioned in my notes that the obvious antidepressant choice would be duloxetine  because it will sometimes help with pain as well as depression and anxiety. Stop mirtazapine  and start duloxetine  20 mg capsule 1 daily for 1 week then 2 capsules  daily. It will take 2 to 4 weeks to get any benefit from the duloxetine .  I would suggest in the interim that she try something that is stronger to help with nausea and anxiety but is likely to be insufficiently helpful for depression, therefore the need for duloxetine . So in addition to the duloxetine  start olanzapine  at the lowest dose 2.5 mg tablet 1 nightly which could help with nausea appetite and anxiety almost right away.  If it causes too much sleepiness have her take it 2 to 3 hours before bedtime and she can even cut it in half if she has to. I will send in both prescriptions.  03/06/22 TC:  On 10/16 you wanted pt to take olanzapine  and Cymbalta  to replace mirtazapine .She did not start the Cymbalta  but did take olanzapine .She had side effects including insomnia,hand tremors,blurred vision and increased back pain.She decided to stop the med.She still wants to try Cymbalta  but wants to know if it's ok to take with mirtazapine  because she decided to start back taking it.     04/13/22 appointment noted:  Tried olanzapine  twice with hand tremor and blurred vision. Back to mirtazapine  15 mg HS.   Less nausea.  Able to gain 6 # but no appetite. Bottomed out in October. Never tried Cymbalta .  Some concern over SE with it. Increased gabapentin  to help pain with a little benefit. 300 BID and tolerating. In pain all day.  Tried Nucynta without help.   Mood is better than it was.  Working to accept chronic pain and limited activithy.  04/20/23 TC  Pt said that the cymbalta  is making her jittery and more anxious. She has cut the mirtazapine  to 7.5mg  not sure if working . Wants something different     MD response:   She is taking the lowest dose of dulxetine and not tolerating it so stop it. I don't have antoher option that will help pain.   If she thinks she is getting some benefit with mirtazapine  then continue it.  If not stop it too I would also suggest she come get the [place] genetic  test I discussed with her.      06/20/22 appt noted: Mirtazapine  helped regain some wt and nausea is better for 6-8 weeks. Currently on 7.5 mg mirtazapine  and was up to 15 mg daily. Still some anxiety issues and needs Xanax . Heart burn without Depakote .  It's ok right now.   Peripheral nn stimulator in her back temporary for 2 mos.  No benefit so far.  Chronic pain.   Still seeing Debbie. Not markedly depressed.   Plan: Cont Depakote  for reflux.  Depakote  ER BRAND helped GI problems which were worse on generic.  But she wants to retry stopping it. Rec continue mirtazapine  bc helpful  09/18/22 apt noted: Still taking Depakote  ER 250 mg HS.  Reduced mirtazapine  to 3.75 mg HS. No N and regained lost wt.  Still some anxiety managed with mirtazapine .  GI is fine now.  Vocal problems.   Chronic back pain.  Accepting of this.  Still  exercise.  Finished therapy with Alm.  Asks about CBD. Plan: Continue Xanax  0.25 mg BID for panic and GAD , but option increase TID bc helps nausea. Depakote  ER BRAND helped GI problems which were worse on generic.  But she wants to retry stopping it. Option continue or stop mirtazapine   02/14/2023 appointment noted: Meds: Alprazolam  0.25 mg 3 times daily as needed anxiety usu just 0.125 AM, Depakote  ER 250 daily, gabapentin  300 mg 3 times daily, mirtazapine  3.75 mg nightly.  Takig hydrocodone  and muscle relaxer. SE no cog px. Decided to continue meds for now. When tried to stop Depakote  had more acid reflux. No CBD bc got sleepy.  Still CBP.  GI ok.   Sleep great. Anxiety is controlled without panic except rarely. 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  08/15/23 appt noted: Med: mirtazapine  7.5 mg HS, Alprazolam  0.25 mg 3 times daily as needed anxiety usu just 0.125 AM, Depakote  ER 250 daily, gabapentin  300 mg 3 times daily.   Takig hydrocodone  and muscle relaxer. Clemens in Nov with secondary EMA with anxiety and more pain. .   Norovirus  feb messed up GI and caused 10# wt loss.  So increased mirtazapine  7.5 mg HS for a couple of weeks. With alprazolam  only taking 0.125 or 0.25 prn anxiety. Doc friend rec rial amitriptyline  for pain. More down bc chronic pain.  Done all the PT  and just hurts so bad.  Plan: DC mirtazapine   Trial amitriptyline  10 mg HS for pain  10/15/23 appt noted: Med: mirtazapine  7.5 mg HS, Alprazolam  0.25 mg 3 times daily as needed anxiety usu just 0.125 AM, Depakote  ER 250 daily, gabapentin  300 mg 3 times daily.    SE sweating and anxiety with amitriptyline .  Mirtazapine  helps more with anxiety and appetite and sleep. Alprazolam  not needed for sleep.   Morphine  NR and stopped.  Mood frustrated with chronic pain 8/10.  Got desperate yesterday and looking on internet to try to find something to help.  Mood is not great.   May switch gabapentin  to Lyrica per other doctor.  Saw neurologist for Migraine and had problems with the meds RX.  Aimovig  has helped..   Back surgeries Cervical 10 y ago.  Latest for scoliosis but didn't get pain relief.    Past Psychiatric Medication Trials:  Failed multiple other antidepressants,  Celexa chest pain Duloxetine  SE Nortriptyline hyper; amitriptyline  10 with SE sweating and NR Hydroxyzine  SE  nefazodone  & Depakote  for migraine,  Depakote  brand ER 250 helps heartburn. Generic failed. Gabapentin  300 TID Mirtazapine  7.5 mg with some benefit and also  Olanzapine  2.5 mg HS with SE dizziness and HA citalopram palpitations Trazodone Clonazepam did not help anxiety, alprazolam  helps anxiety and sleep  Review of Systems:  Review of Systems  Constitutional:  Positive for appetite change.  Gastrointestinal:  Negative for abdominal pain and nausea.  Musculoskeletal:  Positive for arthralgias and back pain.  Neurological:  Negative for tremors.  Psychiatric/Behavioral:  Negative for agitation, behavioral problems, confusion, decreased concentration, dysphoric mood,  hallucinations, self-injury, sleep disturbance and suicidal ideas. The patient is nervous/anxious. The patient is not hyperactive.     Medications: I have reviewed the patient's current medications.  Current Outpatient Medications  Medication Sig Dispense Refill   AIMOVIG  140 MG/ML SOAJ INJECT 140 MG INTO SKIN EVERY 30 DAYS 3 mL 3   ALPRAZolam  (XANAX ) 0.25 MG tablet Take 1 tablet (0.25 mg total) by mouth 3 (three) times daily as needed  for anxiety. (Patient taking differently: Take 0.25 mg by mouth 3 (three) times daily as needed for anxiety. Usually 0.25 mg prn) 270 tablet 1   Ascorbic Acid (VITAMIN C PO) Take 500 mg by mouth daily.     Calcium Carbonate (CALCIUM 600 PO) Take 600-1,200 mg by mouth daily.     cholecalciferol (VITAMIN D3) 25 MCG (1000 UNIT) tablet Take 1,000 Units by mouth daily.     denosumab  (PROLIA ) 60 MG/ML SOSY injection      DEPAKOTE  ER 250 MG 24 hr tablet TAKE 1 TABLET BY MOUTH DAILY 30 tablet 11   famotidine  (PEPCID ) 40 MG tablet TAKE 1 TABLET BY MOUTH DAILY 30 tablet 0   gabapentin  (NEURONTIN ) 300 MG capsule Take 300 mg by mouth every evening. 1 tab TID     HYDROcodone -acetaminophen  (NORCO) 7.5-325 MG tablet Take 1.5 tablets by mouth every 6 (six) hours as needed for moderate pain.     Lactobacillus (ACIDOPHILUS) 100 MG CAPS Take 100 mg by mouth daily.     methocarbamol  (ROBAXIN ) 750 MG tablet Take 750 mg by mouth 3 (three) times daily.     mirtazapine  (REMERON ) 7.5 MG tablet 1 and 1/2 tablets at night. (Patient taking differently: Take 7.5 mg by mouth at bedtime. 1 and 1/2 tablets at night.) 135 tablet 0   Multiple Vitamin (MULTIVITAMIN) tablet Take 1 tablet by mouth daily.     polyethylene glycol (MIRALAX / GLYCOLAX) 17 g packet Take 17 g by mouth daily.     PREVIDENT 5000 SENSITIVE 1.1-5 % GEL Place onto teeth.     Current Facility-Administered Medications  Medication Dose Route Frequency Provider Last Rate Last Admin   [START ON 12/31/2023] denosumab  (PROLIA )  injection 60 mg  60 mg Subcutaneous Once Gherghe, Cristina, MD        Medication Side Effects: None  Allergies:  Allergies  Allergen Reactions   Penicillins Other (See Comments)    Seizures as a child   Atarax  [Hydroxyzine ] Other (See Comments)    Headache, depression   Bactrim  [Sulfamethoxazole -Trimethoprim ] Nausea Only   Celexa [Citalopram Hydrobromide] Other (See Comments)    Chest pain   Erythromycin      Upset stomach   Flagyl  [Metronidazole ]     Dizzy and increased heart rate   Nortriptyline Itching   Orphenadrine     Hand tremors   Prilosec [Omeprazole]     Abdominal pain   Requip [Ropinirole]     Made sx worse   Darvocet [Propoxyphene N-Acetaminophen ] Itching   Percocet [Oxycodone -Acetaminophen ] Itching    Past Medical History:  Diagnosis Date   Allergy    Anxiety    Arthritis    Cervical dysplasia    Diverticulitis    Endometriosis    Fatigue    Gallstones    GERD (gastroesophageal reflux disease)    History of hiatal hernia    pt states she currently has a hiatal hernia   HSV infection    oral   IBS (irritable bowel syndrome)    Migraine    Osteopenia    Osteoporosis 01/2019   T score -3.1   Osteoporosis    Plantar fasciitis    PONV (postoperative nausea and vomiting)    also difficult to wake up   Recurrent vaginitis    Reflux    Scoliosis    Wears glasses     Family History  Problem Relation Age of Onset   Cancer Father        lymphoma   Other  Mother        bipolar,reflux   Bipolar disorder Mother    Other Brother        sinus problems   Cancer Maternal Aunt        uterine cancer   Breast cancer Maternal Aunt        40's   Diabetes Maternal Aunt    Cancer Paternal Aunt        Colon cancer   Breast cancer Cousin 50       Mat. 1st cousin    Social History   Socioeconomic History   Marital status: Married    Spouse name: Ubaldo   Number of children: 0   Years of education: 16   Highest education level: Not on file   Occupational History    Comment: [employer]  Tobacco Use   Smoking status: Former    Current packs/day: 0.00    Types: Cigarettes    Quit date: 05/17/1983    Years since quitting: 40.4   Smokeless tobacco: Never  Vaping Use   Vaping status: Never Used  Substance and Sexual Activity   Alcohol use: Not Currently   Drug use: No   Sexual activity: Not Currently    Birth control/protection: Post-menopausal    Comment: intercourse age 21 , sexual partners more than 5  Other Topics Concern   Not on file  Social History Narrative   Pt is married, no children.  Occupation: retired from [employer].    Caffeine - very little.   Right handed   Lives at home with her husband   Social Drivers of [employer]er Strain: Not on file  Food Insecurity: Not on file  Transportation Needs: Not on file  Physical Activity: Not on file  Stress: Not on file  Social Connections: Not on file  Intimate Partner Violence: Not on file    Past Medical History, Surgical history, Social history, and Family history were reviewed and updated as appropriate.   Please see review of systems for further details on the patient's review from today.   Objective:   Physical Exam:  There were no vitals taken for this visit.  Physical Exam Neurological:     Mental Status: She is alert and oriented to person, place, and time.     Cranial Nerves: No dysarthria.  Psychiatric:        Attention and Perception: Attention and perception normal.        Mood and Affect: Mood is depressed.        Speech: Speech normal.        Behavior: Behavior is cooperative.        Thought Content: Thought content normal. Thought content is not paranoid or delusional. Thought content does not include homicidal or suicidal ideation. Thought content does not include suicidal plan.        Cognition and Memory: Cognition and memory normal.        Judgment: Judgment normal.      Comments: Insight intact      Lab Review:     Component Value Date/Time   NA 140 08/31/2021 1100   NA 134 03/26/2020 1056   K 4.4 08/31/2021 1100   CL 103 08/31/2021 1100   CO2 31 08/31/2021 1100   GLUCOSE 98 08/31/2021 1100   BUN 7 (L) 08/31/2021 1100   BUN 8 03/26/2020 1056   CREATININE 0.73 08/31/2021 1100   CALCIUM 9.4 08/31/2021 1100   PROT  6.9 08/31/2021 1100   ALBUMIN 4.0 08/31/2021 1100   AST 18 08/31/2021 1100   ALT 13 08/31/2021 1100   ALKPHOS 36 (L) 08/31/2021 1100   BILITOT 0.6 08/31/2021 1100   GFRNONAA >60 08/31/2021 1100   GFRAA 91 03/26/2020 1056       Component Value Date/Time   WBC 4.2 08/31/2021 1100   RBC 4.18 08/31/2021 1100   HGB 12.8 08/31/2021 1100   HCT 40.1 08/31/2021 1100   PLT 205 08/31/2021 1100   MCV 95.9 08/31/2021 1100   MCH 30.6 08/31/2021 1100   MCHC 31.9 08/31/2021 1100   RDW 12.4 08/31/2021 1100   LYMPHSABS 0.7 12/21/2019 0949   MONOABS 0.6 12/21/2019 0949   EOSABS 0.0 12/21/2019 0949   BASOSABS 0.0 12/21/2019 0949    Normal liver enzymes in September.  No results found for: POCLITH, LITHIUM   No results found for: PHENYTOIN, PHENOBARB, VALPROATE, CBMZ   .res Assessment: Plan:    Leoni was seen today for follow-up, depression, anxiety, stress and medication problem.  Diagnoses and all orders for this visit:  Generalized anxiety disorder  Panic disorder without agoraphobia  Recurrent major depression in complete remission (HCC)  Insomnia due to mental condition  Migraine without aura and without status migrainosus, not intractable  Gastroesophageal reflux disease without esophagitis    30 min video time with patient was spent on counseling and coordination of care. We discussed the following. Mood is better overall in the last months.   Was Much better with with anxiety, appetite and sleep on mirtazapine  3.75 mg tablet 1 nightly initially.  Nausea, depression and anxiety Are back again.    Cont  vs consider stopping it.  On 250 mg HS.  Depakote  ER BRAND helped GI problems which were worse on generic.  But she wants to retry stopping it.  Supportive therapy dealing with health problems.  Back pain ongoing.    Answered questions about CBD. Option low dose Fetzima 20 for pain.  We discussed the short-term risks associated with benzodiazepines including sedation and increased fall risk among others.  Discussed long-term side effect risk including dependence, potential withdrawal symptoms, and the potential eventual d  ose-related risk of dementia. Disc newer studies that cast in doubt the relationship with dementia.  Not drowsy with alprazolam . Disc warnings about combo of Bz and opiates not relevant at this low dose. BC morning anxiety could try switching to Valium and she doesn't want to try it. Continue Xanax  0.25 mg BID for panic and GAD , but option increase TID bc helps nausea.  Consider Genesight testing bc med sensitivity and failures. ACTX partially reviewed but site is poorly designed and results difficult to see.  Disc this in detail available.  But increase risk SJS with CBZ and oxcarb  Answered questions about Lyrica and SE differences with gabapentin . Answered questions about functional medicine.   Consider higher doses mirtazapine .  Disc SE  increase mirtazapine  7.5 mg 1 and 1/2 tablets and HS,  Alprazolam  0.25 mg 3 times daily as needed anxiety usu just 0.125 AM,  Depakote  ER 250 daily, gabapentin  300 mg 3 times daily.     FU 2 mos  Lorene Macintosh, MD, DFAPA  Please see After Visit Summary for patient specific instructions.  Future Appointments  Date Time Provider Department Center  10/15/2023 12:00 PM Sherlynn Sober, LCSW [facility] None  10/24/2023 11:00 AM Cary, Amy, NP [facility] None  10/29/2023 12:30 PM Stacia Glendia BRAVO, MD [facility]  11/20/2023 10:00 AM  Trixie File, MD [facility] None  01/01/2024  2:00 PM [facility] NURSE [facility] None  03/24/2024  11:20 AM Trixie File, MD [facility] None    No orders of the defined types were placed in this encounter.     -------------------------------

## 2023-10-15 NOTE — Progress Notes (Signed)
 Patient not available to telehealth call as scheduled.  Has health issues.  No charge.

## 2023-10-16 ENCOUNTER — Telehealth: Payer: Self-pay | Admitting: Gastroenterology

## 2023-10-16 ENCOUNTER — Ambulatory Visit: Admitting: Psychiatry

## 2023-10-16 DIAGNOSIS — F411 Generalized anxiety disorder: Secondary | ICD-10-CM | POA: Diagnosis not present

## 2023-10-16 NOTE — Telephone Encounter (Signed)
 Patient called and stated that she was wondering if she can take Zofran  for nausea due to her having a stomach virus. Patient also stated that she is taking Inmodium for her diarrhea, and if her diarrhea gets worse does she need to come into the office. Patient is requesting a call back. Please advise.

## 2023-10-16 NOTE — Progress Notes (Signed)
 [facility] Counselor/Therapist Progress Note  Patient ID: Yolanda Carroll, MRN: [id number],    Date: 10/16/2023  Time Spent: 50 minutes   Treatment Type: Individual Therapy  Virtual Visit via Telehealth Note: My Chart Video session Connected with patient by a telemedicine/telehealth application, with their informed consent, and verified patient privacy and that I am speaking with the correct person using two identifiers. I discussed the limitations, risks, security and privacy concerns of performing psychotherapy and the availability of in person appointments. I also discussed with the patient that there may be a patient responsible charge related to this service. The patient expressed understanding and agreed to proceed. I discussed the treatment planning with the patient. The patient was provided an opportunity to ask questions and all were answered. The patient agreed with the plan and demonstrated an understanding of the instructions. The patient was advised to call  our office if  symptoms worsen or feel they are in a crisis state and need immediate contact.   Therapist Location: office Patient Location: home   Reported Symptoms: anxiety, depression, frustration    Mental Status Exam:  Appearance:   Casual and Neat     Behavior:  Appropriate, Sharing, and Motivated  Motor:  Impacted by physical limitations  Speech/Language:   Clear and Coherent  Affect:  Depressed and anxious  Mood:  anxious and depressed  Thought process:  goal directed  Thought content:    WNL  Sensory/Perceptual disturbances:    WNL  Orientation:  oriented to person, place, time/date, situation, day of week, month of year, year, and stated date of October 16, 2023  Attention:  Fair  Concentration:  Fair and Poor  Memory:  WNL  Fund of knowledge:   Good  Insight:    Fair  Judgment:   Good  Impulse Control:  Fair   Risk Assessment: Danger to Self:  No Self-injurious Behavior: No Danger to Others:  No Duty to Warn:no Physical Aggression / Violence:No  Access to Firearms a concern: No  Gang Involvement:No   Subjective:  Patient today participating well in MyChart video session and reporting anxiety, depression, and frustration mainly related to her health issues. "Trying to get through each day". Concentration is off but trying to participate (by book) in a book study from church about "worrying". States I'm going down several "rabbit trails" in trying to find things that can help her pain. Is doing some visualizations again and helping a little, trying to eat healthy, using guided imagery, staying in close contact with friends/family, and is in good contact with her med providers re: her ongoing back pain. To hopefully begin a new oral medication in approximately 5 days that they are hoping will help her pain level.  Talk through more of patient's depressive feelings and also her anxiety.  Stated several times that just being able to talk freely and confidentially is very helpful for her.  Is trying to work on seeing the positives in the midst of dealing with negatives.  Is staying in good contact with church friends who are helpful and supportive.  Prayers are helpful for her and she has used some from the website that have been particularly helpful which she shared more about today.  Continue to encourage patient in use of some of the coping tools we have spoken about in session, journaling, and having frequent time with understanding friends where she can vent feelings freely.  Anger and frustration is present but not quite as elevated  as previous session.  No thoughts of self-harm.  Motivation to follow through and try new medication is high.  Interventions: Cognitive Behavioral Therapy, Solution-Oriented/Positive Psychology, and Ego-Supportive Long-term goal:  Elevate mood and show evidence of usual energy, activities, and socialization level. Short-term goal: Verbally identify the source of  depressed mood and work to decrease its impact. Strategies: Identify cognitive self-talk that tends to support depression.  Verbalize hopeful and positive statements regarding her has not and future   Diagnosis:   ICD-10-CM   1. Generalized anxiety disorder  F41.1      Plan:   Patient today in MyChart video session showing good motivation in our session today as she continues working on anxiety, depression, frustration, and anger, while trying to use her support system as well as tools that have helped her at times to feel some encouragement when she is really struggling with physical pain/discomfort and emotional issues.  Continues breathing exercises, mindfulness, some exercise, frequent contact with friends, and planning to use some visualizations soon.  Hoping to be able to try new medicine that might offer some hope with her back pain, within approximately 5 days.  Goal review and progress/challenges noted with patient.  Next appointment within approximately 3 weeks.   Kelleen Patee, LCSW

## 2023-10-16 NOTE — Telephone Encounter (Signed)
 Pt states she started having nausea and diarrhea 3 days ago and she has been following the brat diet. She wanted  to know if she can take zofran  for nausea, she has some already. Discussed with pt that she can take the zofran . She will call back if the diarrhea continues to see if she needs to have any stool studies done.

## 2023-10-17 ENCOUNTER — Telehealth: Payer: Self-pay | Admitting: Gastroenterology

## 2023-10-17 NOTE — Telephone Encounter (Signed)
 Patient called and stated that she is having Diarrhea now for a couple of days and this would be her 4 day with diarrhea. Patient stated that she is also have nausea and weakness due to her not being able to eat anything. Patient is requesting a call back to further advise her on what she should do. Please advise.

## 2023-10-17 NOTE — Telephone Encounter (Signed)
 Spoke with pt and she reports she has been having diarrhea now for about 4 days. She has been eating the brat diet but states whatever she eats is going right through her. Pt scheduled to see Brigitte Canard PA 10/19/23@10 :20am. She is aware of appt.

## 2023-10-18 NOTE — Progress Notes (Unsigned)
 Yolanda Canard, PA-C [address] [place], [place]  [address] Phone: [phone]   Primary Care Physician: Olin Bertin, MD  Primary Gastroenterologist:  Yolanda Canard, PA-C /Dr. Cherryl Corona  Chief Complaint: Diarrhea; LLQ Pain; IBS       HPI:   Yolanda Carroll is a 68 y.o. female presents to evaluate worsening LLQ pain, and diarrhea.  Patient states she has had 3 episodes since February 2025 of acute watery diarrhea with nausea and chills.  Most recent episode of diarrhea started 1 week ago.  She is having multiple watery stools per day.  She denies rectal bleeding.  She reports bilateral lower abdominal cramping which is worse in the left lower quadrant.  She is worried about diverticulitis.  She has been eating a brat diet.  She is scheduled for a 10-year repeat screening colonoscopy in 1 week with Dr. Cherryl Corona.  She would like to postpone colonoscopy until she is feeling better.  She has been on hydrocodone  for 3 years due to chronic back pain s/p back surgery.  She typically takes MiraLAX for chronic constipation.  She held her MiraLAX in the past week while she was having diarrhea.  She tried IBgard (peppermint oil) for IBS which caused increased acid reflux and discontinued.  Currently taking Restora probiotic and Imodium as needed.  Takes Zofran  4 Mg as needed for nausea and needs refill.  She has been eating brat diet.  She has had weight loss and is currently seeing a nutritionist to try to gain weight.  She last saw Dr. Cherryl Corona 09/02/2023.  History of GERD, IBS, anxiety, depression, endometriosis, osteoporosis, long COVID.  Previous cholecystectomy.    No recent labs or stool studies.  10/2021 CT abdomen pelvis with contrast (for nausea, weight loss, endometriosis): 1. No acute intra-abdominal pathology identified. No definite radiographic explanation for the patient's reported symptoms. 2. Moderate stool throughout the colon without evidence  of obstruction.  Flexible sigmoidoscopy (Dr. Dellis Fermo) March 25, 2020 Indication: Rectal hemorrhage Normal colon, nonbleeding internal hemorrhoids, presumed to be source of patient's recent bleeding   EGD Sep 16, 2019 (Dr. Dellis Fermo) Indication: Esophageal reflux, weight loss Normal esophagus, normal stomach, normal duodenum Biopsies: Chronic inactive gastritis, normal duodenal biopsies (NO celiac), normal esophageal biopsies   EGD March 25, 2020 (Dr. Dellis Fermo) Indication: Nausea Normal esophagus, normal stomach, normal duodenum Normal biopsies   Colonoscopy July 22, 2013 (Dr. Dellis Fermo) Sigmoid diverticulosis, 4 small polyps in the ascending colon, removed with forceps. Pathology consistent with lymphoid aggregates Recommended repeat colonoscopy in 10 years  Current Outpatient Medications  Medication Sig Dispense Refill   AIMOVIG  140 MG/ML SOAJ INJECT 140 MG INTO SKIN EVERY 30 DAYS 3 mL 3   ALPRAZolam  (XANAX ) 0.25 MG tablet Take 1 tablet (0.25 mg total) by mouth 3 (three) times daily as needed for anxiety. (Patient taking differently: Take 0.25 mg by mouth 3 (three) times daily as needed for anxiety. Usually 0.25 mg prn) 270 tablet 1   Ascorbic Acid (VITAMIN C PO) Take 500 mg by mouth daily.     Calcium Carbonate (CALCIUM 600 PO) Take 600-1,200 mg by mouth daily.     cholecalciferol (VITAMIN D3) 25 MCG (1000 UNIT) tablet Take 1,000 Units by mouth daily.     denosumab  (PROLIA ) 60 MG/ML SOSY injection      DEPAKOTE  ER 250 MG 24 hr tablet TAKE 1 TABLET BY MOUTH DAILY 30 tablet 11   estradiol  (ESTRACE ) 0.1 MG/GM vaginal cream Place 0.5 g vaginally 2 (  two) times a week.     famotidine  (PEPCID ) 40 MG tablet TAKE 1 TABLET BY MOUTH DAILY 30 tablet 0   gabapentin  (NEURONTIN ) 300 MG capsule Take 300 mg by mouth every evening. 1 tab TID     HYDROcodone -acetaminophen  (NORCO) 7.5-325 MG tablet Take 1.5 tablets by mouth every 6 (six) hours as needed for moderate pain.     IMVEXXY  MAINTENANCE  PACK 4 MCG INST Place vaginally.     Lactobacillus (ACIDOPHILUS) 100 MG CAPS Take 100 mg by mouth daily.     methocarbamol  (ROBAXIN ) 750 MG tablet Take 750 mg by mouth 3 (three) times daily.     mirtazapine  (REMERON ) 7.5 MG tablet 1 and 1/2 tablets at night. (Patient taking differently: Take 7.5 mg by mouth at bedtime. 1 and 1/2 tablets at night.) 135 tablet 0   Multiple Vitamin (MULTIVITAMIN) tablet Take 1 tablet by mouth daily.     ondansetron  (ZOFRAN ) 4 MG tablet Take 1 tablet (4 mg total) by mouth as needed for nausea or vomiting. 60 tablet 1   polyethylene glycol (MIRALAX / GLYCOLAX) 17 g packet Take 17 g by mouth daily.     PREVIDENT 5000 SENSITIVE 1.1-5 % GEL Place onto teeth.     Probiotic Product (RESTORA PO) Take by mouth.     Current Facility-Administered Medications  Medication Dose Route Frequency Provider Last Rate Last Admin   [START ON 12/31/2023] denosumab  (PROLIA ) injection 60 mg  60 mg Subcutaneous Once Gherghe, Cristina, MD        Allergies as of 10/19/2023 - Review Complete 10/19/2023  Allergen Reaction Noted   Penicillins Other (See Comments) 11/30/2010   Atarax  [hydroxyzine ] Other (See Comments) 01/08/2017   Bactrim  [sulfamethoxazole -trimethoprim ] Nausea Only 08/25/2021   Celexa [citalopram hydrobromide] Other (See Comments) 02/21/2012   Erythromycin  01/05/2017   Flagyl  [metronidazole ]  10/22/2017   Nortriptyline Itching 04/17/2011   Orphenadrine  08/25/2021   Prilosec [omeprazole]  01/05/2017   Requip [ropinirole]  01/05/2017   Darvocet [propoxyphene n-acetaminophen ] Itching 11/30/2010   Percocet [oxycodone -acetaminophen ] Itching 11/30/2010    Past Medical History:  Diagnosis Date   Allergy    Anxiety    Arthritis    Cervical dysplasia    Diverticulitis    Endometriosis    Fatigue    Gallstones    GERD (gastroesophageal reflux disease)    History of hiatal hernia    pt states she currently has a hiatal hernia   HSV infection    oral   IBS  (irritable bowel syndrome)    Migraine    Osteopenia    Osteoporosis 01/2019   T score -3.1   Osteoporosis    Plantar fasciitis    PONV (postoperative nausea and vomiting)    also difficult to wake up   Recurrent vaginitis    Reflux    Scoliosis    Wears glasses     Past Surgical History:  Procedure Laterality Date   ANTERIOR CERVICAL DECOMP/DISCECTOMY FUSION  04/17/2011   Procedure: ANTERIOR CERVICAL DECOMPRESSION/DISCECTOMY FUSION 2 LEVELS;  Surgeon: Corrina Dimitri;  Location: [facility];  Service: Neurosurgery;  Laterality: N/A;  Cervical five-six, six-seven anterior cervical decompression with fusion,  plating,  and bonegraft    APPENDECTOMY  1978   BACK SURGERY N/A    2022   BREAST BIOPSY  07/13/2011   Procedure: BREAST BIOPSY WITH NEEDLE LOCALIZATION;  Surgeon: Enid Harry, MD;  Location: [facility];  Service: General;  Laterality: Right;  Right breast wire localization  biopsy   BREAST EXCISIONAL BIOPSY Right 2013   BREAST SURGERY     Breast Bx-Benign   CHOLECYSTECTOMY N/A 09/08/2021   Procedure: LAPAROSCOPIC CHOLECYSTECTOMY;  Surgeon: Sim Dryer, MD;  Location: [facility];  Service: General;  Laterality: N/A;   CYSTOSCOPY     GYNECOLOGIC CRYOSURGERY     HERNIA REPAIR  08/02/1995   RIH   LAPAROSCOPIC ENDOMETRIOSIS FULGURATION  1997   PELVIC LAPAROSCOPY     ROTATOR CUFF REPAIR     right 2002 left 2000    Review of Systems:    All systems reviewed and negative except where noted in HPI.    Physical Exam:  BP 110/76   Pulse 61   Ht 5\' 8"  (1.727 m)   Wt 126 lb 2 oz (57.2 kg)   SpO2 97%   BMI 19.18 kg/m  No LMP recorded. Patient is postmenopausal.  General: Well-nourished, tall, thin, well-developed in no acute distress.  Lungs: Clear to auscultation bilaterally. Non-labored. Heart: Regular rate and rhythm, no murmurs rubs or gallops.  Abdomen: Bowel sounds are normal; Abdomen is Soft; No hepatosplenomegaly, masses or hernias;  Moderate LLQ abdominal Tenderness; mild RLQ abdominal tenderness.  There is no upper abdominal tenderness.  No guarding or rebound tenderness. Neuro: Alert and oriented x 3.  Grossly intact.  Psych: Alert and cooperative, anxious mood and affect.  Imaging Studies: No results found.  Labs: CBC    Component Value Date/Time   WBC 4.2 08/31/2021 1100   RBC 4.18 08/31/2021 1100   HGB 12.8 08/31/2021 1100   HCT 40.1 08/31/2021 1100   PLT 205 08/31/2021 1100   MCV 95.9 08/31/2021 1100   MCH 30.6 08/31/2021 1100   MCHC 31.9 08/31/2021 1100   RDW 12.4 08/31/2021 1100   LYMPHSABS 0.7 12/21/2019 0949   MONOABS 0.6 12/21/2019 0949   EOSABS 0.0 12/21/2019 0949   BASOSABS 0.0 12/21/2019 0949    CMP     Component Value Date/Time   NA 140 08/31/2021 1100   NA 134 03/26/2020 1056   K 4.4 08/31/2021 1100   CL 103 08/31/2021 1100   CO2 31 08/31/2021 1100   GLUCOSE 98 08/31/2021 1100   BUN 7 (L) 08/31/2021 1100   BUN 8 03/26/2020 1056   CREATININE 0.73 08/31/2021 1100   CALCIUM 9.4 08/31/2021 1100   PROT 6.9 08/31/2021 1100   ALBUMIN 4.0 08/31/2021 1100   AST 18 08/31/2021 1100   ALT 13 08/31/2021 1100   ALKPHOS 36 (L) 08/31/2021 1100   BILITOT 0.6 08/31/2021 1100   GFRNONAA >60 08/31/2021 1100   GFRAA 91 03/26/2020 1056       Assessment and Plan:   Yolanda Carroll is a 68 y.o. y/o female presents for episodes of diarrhea with increasing LLQ pain in the past few weeks.  Symptoms are very suspicious for irritable bowel syndrome, however cannot exclude diverticulitis or infectious diarrhea.  I am ordering abdominal pelvic CT given her LLQ tenderness on exam today.  Ordering stool studies and lab work for further evaluation.  Postpone colonoscopy until we are sure she does not have diverticulitis or infectious diarrhea.  Continue treatment for irritable bowel syndrome.  1.  Irritable bowel syndrome; diarrhea alternating with constipation - Stop IBgard which worsened her acid  reflux - Continue restoral probiotic OTC - Gave copy of low FODMAP diet - Consider trial of hyoscyamine or dicyclomine in the future, pending test results and symptoms. - Encouraged her to take Metamucil daily. - If she develops  constipation, then take MiraLAX.  2.  LLQ pain with tenderness; rule out diverticulitis - Lab CBC and CMP - CT abdomen pelvis with contrast  3.  Worsening diarrhea; rule out infectious diarrhea - Stool studies: [facility] GI panel with C. difficile.  4.  Colon cancer screening - Last colonoscopy March 2015 was negative. - If CT shows no evidence of diverticulitis, and stool studies are negative for infection, then we will go ahead and reschedule a 10-year repeat screening colonoscopy.  5.  Chronic nausea - Refilled Zofran  4 mg twice daily as needed, #60, 1 refill.  Yolanda Canard, PA-C  Follow up with TG in 3 weeks.  Also follow-up based on test results.

## 2023-10-19 ENCOUNTER — Other Ambulatory Visit

## 2023-10-19 ENCOUNTER — Ambulatory Visit: Admitting: Physician Assistant

## 2023-10-19 ENCOUNTER — Encounter: Payer: Self-pay | Admitting: Physician Assistant

## 2023-10-19 ENCOUNTER — Ambulatory Visit: Payer: Self-pay | Admitting: Physician Assistant

## 2023-10-19 ENCOUNTER — Other Ambulatory Visit: Payer: Self-pay | Admitting: Physician Assistant

## 2023-10-19 VITALS — BP 110/76 | HR 61 | Ht 68.0 in | Wt 126.1 lb

## 2023-10-19 DIAGNOSIS — R197 Diarrhea, unspecified: Secondary | ICD-10-CM | POA: Diagnosis not present

## 2023-10-19 DIAGNOSIS — K582 Mixed irritable bowel syndrome: Secondary | ICD-10-CM | POA: Diagnosis not present

## 2023-10-19 DIAGNOSIS — R10814 Left lower quadrant abdominal tenderness: Secondary | ICD-10-CM

## 2023-10-19 DIAGNOSIS — R1032 Left lower quadrant pain: Secondary | ICD-10-CM

## 2023-10-19 DIAGNOSIS — R110 Nausea: Secondary | ICD-10-CM | POA: Diagnosis not present

## 2023-10-19 LAB — CBC WITH DIFFERENTIAL/PLATELET
Basophils Absolute: 0 10*3/uL (ref 0.0–0.1)
Basophils Relative: 0.5 % (ref 0.0–3.0)
Eosinophils Absolute: 0 10*3/uL (ref 0.0–0.7)
Eosinophils Relative: 0.2 % (ref 0.0–5.0)
HCT: 39.3 % (ref 36.0–46.0)
Hemoglobin: 13.1 g/dL (ref 12.0–15.0)
Lymphocytes Relative: 28.7 % (ref 12.0–46.0)
Lymphs Abs: 1.3 10*3/uL (ref 0.7–4.0)
MCHC: 33.3 g/dL (ref 30.0–36.0)
MCV: 90.3 fl (ref 78.0–100.0)
Monocytes Absolute: 0.6 10*3/uL (ref 0.1–1.0)
Monocytes Relative: 12.5 % — ABNORMAL HIGH (ref 3.0–12.0)
Neutro Abs: 2.6 10*3/uL (ref 1.4–7.7)
Neutrophils Relative %: 58.1 % (ref 43.0–77.0)
Platelets: 250 10*3/uL (ref 150.0–400.0)
RBC: 4.35 Mil/uL (ref 3.87–5.11)
RDW: 13 % (ref 11.5–15.5)
WBC: 4.5 10*3/uL (ref 4.0–10.5)

## 2023-10-19 LAB — COMPREHENSIVE METABOLIC PANEL WITH GFR
ALT: 13 U/L (ref 0–35)
AST: 18 U/L (ref 0–37)
Albumin: 4.8 g/dL (ref 3.5–5.2)
Alkaline Phosphatase: 34 U/L — ABNORMAL LOW (ref 39–117)
BUN: 4 mg/dL — ABNORMAL LOW (ref 6–23)
CO2: 33 meq/L — ABNORMAL HIGH (ref 19–32)
Calcium: 9.5 mg/dL (ref 8.4–10.5)
Chloride: 99 meq/L (ref 96–112)
Creatinine, Ser: 0.79 mg/dL (ref 0.40–1.20)
GFR: 77.15 mL/min (ref >60.00)
Glucose, Bld: 101 mg/dL — ABNORMAL HIGH (ref 70–99)
Potassium: 4.3 meq/L (ref 3.5–5.1)
Sodium: 139 meq/L (ref 135–145)
Total Bilirubin: 0.3 mg/dL (ref 0.2–1.2)
Total Protein: 7.5 g/dL (ref 6.0–8.3)

## 2023-10-19 LAB — C-REACTIVE PROTEIN: CRP: <1 mg/dL (ref 0.5–20.0)

## 2023-10-19 MED ORDER — ONDANSETRON HCL 4 MG PO TABS: 4.0000 mg | ORAL_TABLET | ORAL | 1 refills | Status: DC | PRN

## 2023-10-19 NOTE — Patient Instructions (Signed)
 Your provider has requested that you go to the basement level for lab work before leaving today. Press "B" on the elevator. The lab is located at the first door on the left as you exit the elevator.  You have been scheduled for a CT scan of the abdomen and pelvis at [facility], 1st floor Radiology. You are scheduled on 11/06/23 at 10:30 am. You should arrive 8:15 am for registration and to drink contrast You may take any medications as prescribed with a small amount of water, if necessary. If you take any of the following medications: METFORMIN, GLUCOPHAGE, GLUCOVANCE, AVANDAMET, RIOMET, FORTAMET, ACTOPLUS MET, JANUMET, GLUMETZA or METAGLIP, you MAY be asked to HOLD this medication 48 hours AFTER the exam.   The purpose of you drinking the oral contrast is to aid in the visualization of your intestinal tract. The contrast solution may cause some diarrhea. Depending on your individual set of symptoms, you may also receive an intravenous injection of x-ray contrast/dye. Plan on being at [facility] for 45 minutes or longer, depending on the type of exam you are having performed.   If you have any questions regarding your exam or if you need to reschedule, you may call [name] at [phone] between the hours of 8:00 am and 5:00 pm, Monday-Friday.   We have sent the following medications to your pharmacy for you to pick up at your convenience: Zofran   Your provider has ordered "Diatherix" stool testing for you. You have received a kit from our office today containing all necessary supplies to complete this test. Please carefully read the stool collection instructions provided in the kit before opening the accompanying materials. In addition, be sure there is a label providing your full name and date of birth on the "puritan opti-swab" tube that is supplied in the kit (if you do not see a label with this information on your test tube, please make us  aware before test collection!).  After completing the test, you should secure the purtian tube into the specimen biohazard bag. The [facility] E-Req sheet (including date and time of specimen collection) should be placed into the outside pocket of the specimen biohazard bag and returned to the [facility] (basement floor of [facility]) within 3 days of collection. Please make sure to give the specimen to a staff member at the lab. DO NOT leave the specimen on the counter.   If the specimen date and time (can be found in the upper right boxed portion of the sheet) are not filled out on the E-Req sheet, the test will NOT be performed.    If your blood pressure at your visit was 140/90 or greater, please contact your primary care physician to follow up on this.  _______________________________________________________  If you are age 38 or older, your body mass index should be between 23-30. Your Body mass index is 19.18 kg/m. If this is out of the aforementioned range listed, please consider follow up with your Primary Care Provider.  If you are age 109 or younger, your body mass index should be between 19-25. Your Body mass index is 19.18 kg/m. If this is out of the aformentioned range listed, please consider follow up with your Primary Care Provider.   ________________________________________________________  The [facility] providers would like to encourage you to use MYCHART to communicate with providers for non-urgent requests or questions.  Due to long hold times on the telephone, sending your provider a message by  MYCHART may be a faster and more efficient way to get a response.  Please allow 48 business hours for a response.  Please remember that this is for non-urgent requests.  _______________________________________________________  Due to recent changes in healthcare laws, you may see the results of your imaging and laboratory studies on MyChart before your provider has had a chance to  review them.  We understand that in some cases there may be results that are confusing or concerning to you. Not all laboratory results come back in the same time frame and the provider may be waiting for multiple results in order to interpret others.  Please give us  48 hours in order for your provider to thoroughly review all the results before contacting the office for clarification of your results.   Thank you for entrusting me with your care and for choosing [facility], Dr. Regino Caprio

## 2023-10-22 ENCOUNTER — Other Ambulatory Visit: Payer: Self-pay | Admitting: Psychiatry

## 2023-10-22 DIAGNOSIS — F5105 Insomnia due to other mental disorder: Secondary | ICD-10-CM

## 2023-10-22 DIAGNOSIS — F410 Panic disorder [episodic paroxysmal anxiety] without agoraphobia: Secondary | ICD-10-CM

## 2023-10-22 DIAGNOSIS — F3342 Major depressive disorder, recurrent, in full remission: Secondary | ICD-10-CM

## 2023-10-23 ENCOUNTER — Other Ambulatory Visit: Payer: Self-pay | Admitting: Gastroenterology

## 2023-10-23 NOTE — Progress Notes (Unsigned)
 No chief complaint on file.   HISTORY OF PRESENT ILLNESS:  10/23/23 ALL:  Yolanda Carroll returns for follow up for migraines. She was last seen 10/2022 and doing well on Amovig and Nurtec.   PAP?  10/24/2022 ALL: Yolanda Carroll returns for follow up for migraines. She continues Amovig and Nurtec. She reports doing very well. She has not had any headaches since 11/2021. She has not needed Nurtec. She stays well hydrated. BP normally on lower side. She is asymptomatic. She recently had cataract surgery and is recovering well. She is feeling well and without concerns.   10/20/2021 ALL: Yolanda Carroll returns for follow up for migraines. She continues Amovig (PAP) and Nurtec. She reports doing very well. She has not had a migraine since 03/2021. She has not yet taken Nurtec but does have some just in case. She is having more nausea following cholecystectomy. She continues to recover from back surgery last July. She is exercising regularly. She feels mood is ok. She is disappointed that it taking her longer to heal. She is able to drive. She is followed closely by care team.   12/21/20 ALL (Mychart): Yolanda Carroll is a 68 y.o. female here today for follow up. She continues Amovig but reports that copay is too expensive. She has failed Ajovy . Discussed trial of Emgality at last visit, however, unsure if this will be affordable. She is doing very well on Amovig. She is not having any headache days. She has Nurtec but has not needed to take it.   She had posterior T4-ilium fusion on 7/19. She was discharged from rehab 12/17/2020. She is doing very well. She is doing home exercises and walking daily.   Meds tried: Amovig (on now), Depakote , Imitrex , baclofen, Nortriptyline, Gabapentin , Nefazodone , Skelaxin, ketaprofen  10/20/2020 ALL:  Yolanda Carroll is a 68 y.o. female here today for follow up for migraines. She continues Amovig and Nurtec. She reports that headaches have resolved. She has not had to take Nurtec. She can't  remember when she last had a migraine. She tried to wean divalproex  but had severe acid reflux. Psychiatry resumed divalproex . She is also taking Carafate. Since being diagnosed with Covid in 12/2019, she has had significant anxiety. She is being treated with alprazolam  (dose was increased) and started on mirtazapine .   HISTORY (copied from Dr Harding Li previous note)  Interval history 08/05/2019:  She is still doing well on Aimovig . She came off of the nefazodone . She came off the depakote  and her migraines are stable but she was having other symptoms such as swallowing problems and maybe withdrawal symptoms, she is on pepcid  now, she doesn't want to go back on them. She continues the xanax  at bedtime and wants to stop that as well. Ubrelvy  did not work. She has not had many headaches, she still has meclizine and she has taken that that aspirin  and ibuprofen for mild headaches. She has not had any severe headaches this year, mild and responded to the meclizine combo above. Gave her samples of Nurtec.   Interval history 08/01/2018: She went back to Aimovig  because it worked better. Ajovy  didn't work, it wore off.  The aimovig  gave her constipation but she is working with GI and feels better. Discussed other acute medications, Ubrelvy  and others. Has had a difficult time with acute management. She feels tremendously improved.  In the past we have tried her on a combination of meds for acute management, she is tried multiple of them Cambia , Zofran , Relpax  and other triptans,  baclofen, tramadol, Fioricet, zembrace and Zomig  and other triptan's.  Today we discussed some of the new medications such as Ubrogrepant and Lasmitidan.  I gave her samples of Ubrelvy  today and she will get back to us .   Interval History 01/29/2018; Tried her on a combination of meds for acute management (cambia , zofran , relpax ) was also taking imitrex  and baclofen. Tramadol in hte past did not help. Tried Fioricet at last appointment, made her  dizzy and made her itch. Discussed trying other medications. Discussed oral medications. She has them worse in the winter, they can last up to 7 days. Discussed trying Zembrace and Zomig  nasal acutely and also starting Aimovig  for the next 3 months as she has worsening migraines in winter will provide samples and see how she does.   Interval history 03/12/2017: Tried a combination of medications, cambia , relpax  and zofran . Cambia  alone did not help. Zofran  and relpax  did not help when she got home. Took all three together later in the mirgaine didn;t help. But also tried it at onset of headache and did not stop the migraine. She had her last migraine at the end of August until the 17th of September. Then October 24th had another headache and the tylenol  worked. Lasted a few days, baclofen for neck pain but did not stop it until the 26th-28th. 63 days has had 41 headache free days. In 2 months had 22 migraine days. Season may also trigger. She has tried imitrex  PO. Tramadol shot in the past had not helped, migraine started on 6/27 and imitrex  did not help but she had the headache for several days so this was not at onset. Tried cambia .    HPI:  Yolanda Carroll is a 68 y.o. female here as a referral from Dr. Alexia Angelucci for migraines. She is currently on Depakote , meloxicam, Imitrex , baclofen. She has a past medical history of osteoporosis, plantar fasciitis, migraine, irritable bowel syndrome, fatigue, neck and back pain with degenerative disc disease and radiculopathy, arthritis. She has had migraines since 2000. Started worsening in October. They can last up to 2 weeks straight. She has associated vertigo. She has done well on nefazodone  and depakote . The next headache was in June and lasted 2 weeks. It hurts behind the eyes, she can still function but is moderate in pain, weather triggers, she has light sensitivity, no significant nausea or vomiting. She has had nausea and vomiting in the past. Depakote  is  working for her. Slowly progressive when they start. No medication overuse. No other focal neurologic deficits, associated symptoms, inciting events or modifiable factors.   Meds tried: Depakote , Imitrex , baclofen, Nortriptyline, Gabapentin , Nefazodone , Skelaxin, ketaprofen,    Reviewed notes, labs and imaging from outside physicians, which showe:    Personally reviewed imaging and agree with following   MRI cervical spine 07/2013: 1. Mild worsening of the foraminal impingement at C7-T1 due to progressive spondylosis and degenerative disc disease. 2. Mild left foraminal impingement at C3-4, C5-6, and C6-7 primarily due to spurring. Although there is some residual left foraminal impingement at the postoperative levels, the degree of impingement at these levels is much less than on the preoperative exam.    REVIEW OF SYSTEMS: Out of a complete 14 system review of symptoms, the patient complains only of the following symptoms, acid reflux, anxiety, back pain, and all other reviewed systems are negative.   ALLERGIES: Allergies  Allergen Reactions   Penicillins Other (See Comments)    Seizures as a child   Atarax  [Hydroxyzine ] Other (  See Comments)    Headache, depression   Bactrim  [Sulfamethoxazole -Trimethoprim ] Nausea Only   Celexa [Citalopram Hydrobromide] Other (See Comments)    Chest pain   Erythromycin      Upset stomach   Flagyl  [Metronidazole ]     Dizzy and increased heart rate   Nortriptyline Itching   Orphenadrine     Hand tremors   Prilosec [Omeprazole]     Abdominal pain   Requip [Ropinirole]     Made sx worse   Darvocet [Propoxyphene N-Acetaminophen ] Itching   Percocet [Oxycodone -Acetaminophen ] Itching     HOME MEDICATIONS: Outpatient Medications Prior to Visit  Medication Sig Dispense Refill   AIMOVIG  140 MG/ML SOAJ INJECT 140 MG INTO SKIN EVERY 30 DAYS 3 mL 3   ALPRAZolam  (XANAX ) 0.25 MG tablet Take 1 tablet (0.25 mg total) by mouth 3 (three) times daily as  needed for anxiety. (Patient taking differently: Take 0.25 mg by mouth 3 (three) times daily as needed for anxiety. Usually 0.25 mg prn) 270 tablet 1   Ascorbic Acid (VITAMIN C PO) Take 500 mg by mouth daily.     Calcium Carbonate (CALCIUM 600 PO) Take 600-1,200 mg by mouth daily.     cholecalciferol (VITAMIN D3) 25 MCG (1000 UNIT) tablet Take 1,000 Units by mouth daily.     denosumab  (PROLIA ) 60 MG/ML SOSY injection      DEPAKOTE  ER 250 MG 24 hr tablet TAKE 1 TABLET BY MOUTH DAILY 30 tablet 11   estradiol  (ESTRACE ) 0.1 MG/GM vaginal cream Place 0.5 g vaginally 2 (two) times a week.     famotidine  (PEPCID ) 40 MG tablet TAKE 1 TABLET BY MOUTH DAILY 30 tablet 3   gabapentin  (NEURONTIN ) 300 MG capsule Take 300 mg by mouth every evening. 1 tab TID     HYDROcodone -acetaminophen  (NORCO) 7.5-325 MG tablet Take 1.5 tablets by mouth every 6 (six) hours as needed for moderate pain.     IMVEXXY  MAINTENANCE PACK 4 MCG INST Place vaginally.     Lactobacillus (ACIDOPHILUS) 100 MG CAPS Take 100 mg by mouth daily.     methocarbamol  (ROBAXIN ) 750 MG tablet Take 750 mg by mouth 3 (three) times daily.     mirtazapine  (REMERON ) 7.5 MG tablet TAKE 1 AND 1/2 TABLETS BY MOUTH  AT NIGHT (DISCONTINUE  AMITRIPTYLINE ) 135 tablet 0   Multiple Vitamin (MULTIVITAMIN) tablet Take 1 tablet by mouth daily.     ondansetron  (ZOFRAN ) 4 MG tablet Take 1 tablet (4 mg total) by mouth 2 (two) times daily as needed for nausea or vomiting. 60 tablet 1   polyethylene glycol (MIRALAX / GLYCOLAX) 17 g packet Take 17 g by mouth daily.     PREVIDENT 5000 SENSITIVE 1.1-5 % GEL Place onto teeth.     Probiotic Product (RESTORA PO) Take by mouth.     Facility-Administered Medications Prior to Visit  Medication Dose Route Frequency Provider Last Rate Last Admin   [START ON 12/31/2023] denosumab  (PROLIA ) injection 60 mg  60 mg Subcutaneous Once Gherghe, Cristina, MD         PAST MEDICAL HISTORY: Past Medical History:  Diagnosis Date    Allergy    Anxiety    Arthritis    Cervical dysplasia    Diverticulitis    Endometriosis    Fatigue    Gallstones    GERD (gastroesophageal reflux disease)    History of hiatal hernia    pt states she currently has a hiatal hernia   HSV infection    oral  IBS (irritable bowel syndrome)    Migraine    Osteopenia    Osteoporosis 01/2019   T score -3.1   Osteoporosis    Plantar fasciitis    PONV (postoperative nausea and vomiting)    also difficult to wake up   Recurrent vaginitis    Reflux    Scoliosis    Wears glasses      PAST SURGICAL HISTORY: Past Surgical History:  Procedure Laterality Date   ANTERIOR CERVICAL DECOMP/DISCECTOMY FUSION  04/17/2011   Procedure: ANTERIOR CERVICAL DECOMPRESSION/DISCECTOMY FUSION 2 LEVELS;  Surgeon: Corrina Dimitri;  Location: [facility];  Service: Neurosurgery;  Laterality: N/A;  Cervical five-six, six-seven anterior cervical decompression with fusion,  plating,  and bonegraft    APPENDECTOMY  1978   BACK SURGERY N/A    2022   BREAST BIOPSY  07/13/2011   Procedure: BREAST BIOPSY WITH NEEDLE LOCALIZATION;  Surgeon: Enid Harry, MD;  Location: [facility];  Service: General;  Laterality: Right;  Right breast wire localization biopsy   BREAST EXCISIONAL BIOPSY Right 2013   BREAST SURGERY     Breast Bx-Benign   CHOLECYSTECTOMY N/A 09/08/2021   Procedure: LAPAROSCOPIC CHOLECYSTECTOMY;  Surgeon: Sim Dryer, MD;  Location: [facility];  Service: General;  Laterality: N/A;   CYSTOSCOPY     GYNECOLOGIC CRYOSURGERY     HERNIA REPAIR  08/02/1995   RIH   LAPAROSCOPIC ENDOMETRIOSIS FULGURATION  1997   PELVIC LAPAROSCOPY     ROTATOR CUFF REPAIR     right 2002 left 2000     FAMILY HISTORY: Family History  Problem Relation Age of Onset   Cancer Father        lymphoma   Other Mother        bipolar,reflux   Bipolar disorder Mother    Other Brother        sinus problems   Cancer Maternal Aunt        uterine cancer    Breast cancer Maternal Aunt        40's   Diabetes Maternal Aunt    Cancer Paternal Aunt        Colon cancer   Breast cancer Cousin 50       Mat. 1st cousin     SOCIAL HISTORY: Social History   Socioeconomic History   Marital status: Married    Spouse name: Hewitt Lou   Number of children: 0   Years of education: 16   Highest education level: Not on file  Occupational History    Comment: [employer]  Tobacco Use   Smoking status: Former    Current packs/day: 0.00    Types: Cigarettes    Quit date: 05/17/1983    Years since quitting: 40.4   Smokeless tobacco: Never  Vaping Use   Vaping status: Never Used  Substance and Sexual Activity   Alcohol use: Not Currently   Drug use: No   Sexual activity: Not Currently    Birth control/protection: Post-menopausal    Comment: intercourse age 56 , sexual partners more than 5  Other Topics Concern   Not on file  Social History Narrative   Pt is married, no children.  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    Caffeine - very little.   Right handed   Lives at home with her husband   Social Drivers of Corporate investment banker Strain: Not on file  Food Insecurity: Not on file  Transportation Needs: Not on file  Physical Activity: Not on file  Stress: Not on file  Social Connections: Not on file  Intimate Partner Violence: Not on file      PHYSICAL EXAM  There were no vitals filed for this visit.    There is no height or weight on file to calculate BMI.   Generalized: Well developed, in no acute distress  Cardiology: normal rate and rhythm, no murmur auscultated  Respiratory: clear to auscultation bilaterally    Neurological examination  Mentation: Alert oriented to time, place, history taking. Follows all commands speech and language fluent Cranial nerve II-XII: Facial sensation and strength were normal. Head turning and shoulder shrug  were normal and symmetric. Motor: The  motor testing reveals 5 over 5 strength of all 4 extremities. Good symmetric motor tone is noted throughout.  Gait and station: Gait is normal.     DIAGNOSTIC DATA (LABS, IMAGING, TESTING) - I reviewed patient records, labs, notes, testing and imaging myself where available.  Lab Results  Component Value Date   WBC 4.5 10/19/2023   HGB 13.1 10/19/2023   HCT 39.3 10/19/2023   MCV 90.3 10/19/2023   PLT 250.0 10/19/2023      Component Value Date/Time   NA 139 10/19/2023 1130   NA 134 03/26/2020 1056   K 4.3 10/19/2023 1130   CL 99 10/19/2023 1130   CO2 33 (H) 10/19/2023 1130   GLUCOSE 101 (H) 10/19/2023 1130   BUN 4 (L) 10/19/2023 1130   BUN 8 03/26/2020 1056   CREATININE 0.79 10/19/2023 1130   CALCIUM 9.5 10/19/2023 1130   PROT 7.5 10/19/2023 1130   ALBUMIN 4.8 10/19/2023 1130   AST 18 10/19/2023 1130   ALT 13 10/19/2023 1130   ALKPHOS 34 (L) 10/19/2023 1130   BILITOT 0.3 10/19/2023 1130   GFRNONAA >60 08/31/2021 1100   GFRAA 91 03/26/2020 1056   No results found for: "CHOL", "HDL", "LDLCALC", "LDLDIRECT", "TRIG", "CHOLHDL" No results found for: "HGBA1C" No results found for: "VITAMINB12" No results found for: "TSH"      No data to display               No data to display           ASSESSMENT AND PLAN  68 y.o. year old female  has a past medical history of Allergy, Anxiety, Arthritis, Cervical dysplasia, Diverticulitis, Endometriosis, Fatigue, Gallstones, GERD (gastroesophageal reflux disease), History of hiatal hernia, HSV infection, IBS (irritable bowel syndrome), Migraine, Osteopenia, Osteoporosis (01/2019), Osteoporosis, Plantar fasciitis, PONV (postoperative nausea and vomiting), Recurrent vaginitis, Reflux, Scoliosis, and Wears glasses. here with    No diagnosis found.  Ruth is doing very well from a headache standpoint. We will continue Amovig and Nurtec. She is receiving Amovig through PAP and will call when updated rx is needed. She will  continue to work with care team for back pain, anxiety and GERD. Healthy lifestyle habits encouraged. She will follow up with me in 1 year, sooner if needed.   No orders of the defined types were placed in this encounter.    No orders of the defined types were placed in this encounter.     Terrilyn Fick, MSN, FNP-C 10/23/2023, 12:37 PM  [facility] [address] [place], [place] [address] [phone]

## 2023-10-24 ENCOUNTER — Ambulatory Visit (INDEPENDENT_AMBULATORY_CARE_PROVIDER_SITE_OTHER): Payer: Medicare Other | Admitting: Family Medicine

## 2023-10-24 ENCOUNTER — Telehealth: Payer: Self-pay | Admitting: Pharmacist

## 2023-10-24 ENCOUNTER — Encounter: Payer: Self-pay | Admitting: Family Medicine

## 2023-10-24 VITALS — BP 95/58 | HR 63 | Ht 68.0 in | Wt 126.4 lb

## 2023-10-24 DIAGNOSIS — G43001 Migraine without aura, not intractable, with status migrainosus: Secondary | ICD-10-CM

## 2023-10-24 MED ORDER — NURTEC 75 MG PO TBDP: 75.0000 mg | ORAL_TABLET | Freq: Every day | ORAL | 11 refills | Status: DC | PRN

## 2023-10-24 NOTE — Telephone Encounter (Signed)
 Pharmacy Patient Advocate Encounter   Received notification from Patient Pharmacy that prior authorization for Nurtec 75MG  dispersible tablets is required/requested.   Insurance verification completed.   The patient is insured through [facility] .   Per test claim: PA required; PA submitted to above mentioned insurance via CoverMyMeds Key/confirmation #/EOC [id number] Status is pending

## 2023-10-24 NOTE — Patient Instructions (Addendum)
 Below is our plan:  We will continue Amovig every 30 days and Nurtec as needed.   Please make sure you are staying well hydrated. I recommend 50-60 ounces daily. Well balanced diet and regular exercise encouraged. Consistent sleep schedule with 6-8 hours recommended.   Please continue follow up with care team as directed.   Follow up with me in 1 year   You may receive a survey regarding today's visit. I encourage you to leave honest feed back as I do use this information to improve patient care. Thank you for seeing me today!   GENERAL HEADACHE INFORMATION:   Natural supplements: Magnesium  Oxide or Magnesium  Glycinate 500 mg at bed (up to 800 mg daily) Coenzyme Q10 300 mg in AM Vitamin B2- 200 mg twice a day   Add 1 supplement at a time since even natural supplements can have undesirable side effects. You can sometimes buy supplements cheaper (especially Coenzyme Q10) at www.WebmailGuide.co.za or at [facility].  Migraine with aura: There is increased risk for stroke in women with migraine with aura and a contraindication for the combined contraceptive pill for use by women who have migraine with aura. The risk for women with migraine without aura is lower. However other risk factors like smoking are far more likely to increase stroke risk than migraine. There is a recommendation for no smoking and for the use of OCPs without estrogen such as progestogen only pills particularly for women with migraine with aura.Yolanda Carroll People who have migraine headaches with auras may be 3 times more likely to have a stroke caused by a blood clot, compared to migraine patients who don't see auras. Women who take hormone-replacement therapy may be 30 percent more likely to suffer a clot-based stroke than women not taking medication containing estrogen. Other risk factors like smoking and high blood pressure may be  much more important.    Vitamins and herbs that show potential:   Magnesium : Magnesium  (250 mg twice a day or  500 mg at bed) has a relaxant effect on smooth muscles such as blood vessels. Individuals suffering from frequent or daily headache usually have low magnesium  levels which can be increase with daily supplementation of 400-750 mg. Three trials found 40-90% average headache reduction  when used as a preventative. Magnesium  may help with headaches are aura, the best evidence for magnesium  is for migraine with aura is its thought to stop the cortical spreading depression we believe is the pathophysiology of migraine aura.Magnesium  also demonstrated the benefit in menstrually related migraine.  Magnesium  is part of the messenger system in the serotonin cascade and it is a good muscle relaxant.  It is also useful for constipation which can be a side effect of other medications used to treat migraine. Good sources include nuts, whole grains, and tomatoes. Side Effects: loose stool/diarrhea  Riboflavin (vitamin B 2) 200 mg twice a day. This vitamin assists nerve cells in the production of ATP a principal energy storing molecule.  It is necessary for many chemical reactions in the body.  There have been at least 3 clinical trials of riboflavin using 400 mg per day all of which suggested that migraine frequency can be decreased.  All 3 trials showed significant improvement in over half of migraine sufferers.  The supplement is found in bread, cereal, milk, meat, and poultry.  Most Americans get more riboflavin than the recommended daily allowance, however riboflavin deficiency is not necessary for the supplements to help prevent headache. Side effects: energizing, green urine  Coenzyme Q10: This is present in almost all cells in the body and is critical component for the conversion of energy.  Recent studies have shown that a nutritional supplement of CoQ10 can reduce the frequency of migraine attacks by improving the energy production of cells as with riboflavin.  Doses of 150 mg twice a day have been shown to be  effective.   Melatonin: Increasing evidence shows correlation between melatonin secretion and headache conditions.  Melatonin supplementation has decreased headache intensity and duration.  It is widely used as a sleep aid.  Sleep is natures way of dealing with migraine.  A dose of 3 mg is recommended to start for headaches including cluster headache. Higher doses up to 15 mg has been reviewed for use in Cluster headache and have been used. The rationale behind using melatonin for cluster is that many theories regarding the cause of Cluster headache center around the disruption of the normal circadian rhythm in the brain.  This helps restore the normal circadian rhythm.   HEADACHE DIET: Foods and beverages which may trigger migraine Note that only 20% of headache patients are food sensitive. You will know if you are food sensitive if you get a headache consistently 20 minutes to 2 hours after eating a certain food. Only cut out a food if it causes headaches, otherwise you might remove foods you enjoy! What matters most for diet is to eat a well balanced healthy diet full of vegetables and low fat protein, and to not miss meals.   Chocolate, other sweets ALL cheeses except cottage and cream cheese Dairy products, yogurt, sour cream, ice cream Liver Meat extracts (Bovril, Marmite, meat tenderizers) Meats or fish which have undergone aging, fermenting, pickling or smoking. These include: Hotdogs,salami,Lox,sausage, mortadellas,smoked salmon, pepperoni, Pickled herring Pods of broad bean (English beans, Chinese pea pods, [place] (fava) beans, lima and navy beans Ripe avocado, ripe banana Yeast extracts or active yeast preparations such as Brewer's or Fleishman's (commercial bakes goods are permitted) Tomato based foods, pizza (lasagna, etc.)   MSG (monosodium glutamate) is disguised as many things; look for these common aliases: Monopotassium glutamate Autolysed yeast Hydrolysed protein Sodium  caseinate "flavorings" "all natural preservatives Nutrasweet   Avoid all other foods that convincingly provoke headaches.   Resources: The Dizzy Althia Jetty Your Headache Diet, migrainestrong.com  https://zamora-andrews.com/   Caffeine  and Migraine For patients that have migraine, caffeine  intake more than 3 days per week can lead to dependency and increased migraine frequency. I would recommend cutting back on your caffeine  intake as best you can. The recommended amount of caffeine  is 200-300 mg daily, although migraine patients may experience dependency at even lower doses. While you may notice an increase in headache temporarily, cutting back will be helpful for headaches in the long run. For more information on caffeine  and migraine, visit: https://americanmigrainefoundation.org/resource-library/caffeine -and-migraine/   Headache Prevention Strategies:   1. Maintain a headache diary; learn to identify and avoid triggers.  - This can be a simple note where you log when you had a headache, associated symptoms, and medications used - There are several smartphone apps developed to help track migraines: Migraine Buddy, Migraine Monitor, Curelator N1-Headache App   Common triggers include: Emotional triggers: Emotional/Upset family or friends Emotional/Upset occupation Business reversal/success Anticipation anxiety Crisis-serious Post-crisis periodNew job/position   Physical triggers: Vacation Day Weekend Strenuous Exercise High Altitude Location New Move Menstrual Day Physical Illness Oversleep/Not enough sleep Weather changes Light: Photophobia or light sesnitivity treatment involves a balance between desensitization and reduction  in overly strong input. Use dark polarized glasses outside, but not inside. Avoid bright or fluorescent light, but do not dim environment to the point that going into a normally lit room hurts. Consider  FL-41 tint lenses, which reduce the most irritating wavelengths without blocking too much light.  These can be obtained at axonoptics.com or theraspecs.com Foods: see list above.   2. Limit use of acute treatments (over-the-counter medications, triptans, etc.) to no more than 2 days per week or 10 days per month to prevent medication overuse headache (rebound headache).     3. Follow a regular schedule (including weekends and holidays): Don't skip meals. Eat a balanced diet. 8 hours of sleep nightly. Minimize stress. Exercise 30 minutes per day. Being overweight is associated with a 5 times increased risk of chronic migraine. Keep well hydrated and drink 6-8 glasses of water per day.   4. Initiate non-pharmacologic measures at the earliest onset of your headache. Rest and quiet environment. Relax and reduce stress. Breathe2Relax is a free app that can instruct you on    some simple relaxtion and breathing techniques. Http://Dawnbuse.com is a    free website that provides teaching videos on relaxation.  Also, there are  many apps that   can be downloaded for "mindful" relaxation.  An app called YOGA NIDRA will help walk you through mindfulness. Another app called Calm can be downloaded to give you a structured mindfulness guide with daily reminders and skill development. Headspace for guided meditation Mindfulness Based Stress Reduction Online Course: www.palousemindfulness.com Cold compresses.   5. Don't wait!! Take the maximum allowable dosage of prescribed medication at the first sign of migraine.   6. Compliance:  Take prescribed medication regularly as directed and at the first sign of a migraine.   7. Communicate:  Call your physician when problems arise, especially if your headaches change, increase in frequency/severity, or become associated with neurological symptoms (weakness, numbness, slurred speech, etc.). Proceed to emergency room if you experience new or worsening symptoms or  symptoms do not resolve, if you have new neurologic symptoms or if headache is severe, or for any concerning symptom.   8. Headache/pain management therapies: Consider various complementary methods, including medication, behavioral therapy, psychological counselling, biofeedback, massage therapy, acupuncture, dry needling, and other modalities.  Such measures may reduce the need for medications. Counseling for pain management, where patients learn to function and ignore/minimize their pain, seems to work very well.   9. Recommend changing family's attention and focus away from patient's headaches. Instead, emphasize daily activities. If first question of day is 'How are your headaches/Do you have a headache today?', then patient will constantly think about headaches, thus making them worse. Goal is to re-direct attention away from headaches, toward daily activities and other distractions.   10. Helpful Websites: www.AmericanHeadacheSociety.org PatentHood.ch www.headaches.org TightMarket.nl www.achenet.org

## 2023-10-24 NOTE — Telephone Encounter (Signed)
 Pharmacy Patient Advocate Encounter  Received notification from [facility] that Prior Authorization for Nurtec 75MG  dispersible tablets has been APPROVED from 10/24/2023 to 05/14/2024   PA #/Case ID/Reference #: [id number]

## 2023-10-24 NOTE — Telephone Encounter (Signed)
 Pt was informed via my chart on 10/24/23 telephone encounter.

## 2023-10-29 ENCOUNTER — Encounter: Admitting: Gastroenterology

## 2023-11-04 NOTE — Progress Notes (Signed)
 Agree with the assessment and plan as outlined by Brigitte Canard, PA-C.

## 2023-11-06 ENCOUNTER — Encounter (HOSPITAL_COMMUNITY): Payer: Self-pay

## 2023-11-06 ENCOUNTER — Ambulatory Visit (HOSPITAL_COMMUNITY)
Admission: RE | Admit: 2023-11-06 | Discharge: 2023-11-06 | Disposition: A | Discharge status: Home / Self Care | Source: Ambulatory Visit | Attending: Physician Assistant | Admitting: Physician Assistant

## 2023-11-06 DIAGNOSIS — K582 Mixed irritable bowel syndrome: Secondary | ICD-10-CM | POA: Diagnosis present

## 2023-11-06 DIAGNOSIS — R1032 Left lower quadrant pain: Secondary | ICD-10-CM | POA: Diagnosis present

## 2023-11-06 MED ORDER — IOHEXOL 9 MG/ML PO SOLN
1000.0000 mL | ORAL | Status: AC
  Administered 2023-11-06: 1000 mL via ORAL

## 2023-11-06 MED ORDER — SODIUM CHLORIDE (PF) 0.9 % IJ SOLN
INTRAMUSCULAR | Status: AC
Start: 2023-11-06 — End: 2023-11-06
  Filled 2023-11-06: qty 50

## 2023-11-06 MED ORDER — IOHEXOL 9 MG/ML PO SOLN
ORAL | Status: AC
  Filled 2023-11-06: qty 1000

## 2023-11-06 MED ORDER — IOHEXOL 300 MG/ML  SOLN
100.0000 mL | Freq: Once | INTRAMUSCULAR | Status: AC | PRN
  Administered 2023-11-06: 100 mL via INTRAVENOUS

## 2023-11-07 NOTE — Progress Notes (Signed)
 Call and notify patient abdominal pelvic CT with contrast shows: 1.  No evidence of diverticulitis. 2.  Large stool burden in the colon, consistent with constipation. 3.  Otherwise normal CT.  Liver, pancreas, intestines all normal. Ask if patient is taking MiraLAX?  If MiraLAX is not helping constipation, then I recommend try Linzess 145 mcg 1 capsule once daily, #30, 2 refills.  Keep follow-up appointment 7/15 as scheduled. Ellouise Console, PA-C

## 2023-11-13 ENCOUNTER — Telehealth: Payer: Self-pay

## 2023-11-13 NOTE — Telephone Encounter (Signed)
 I called patient and informed her of her negative Diatherix stool test. The patient is aware, understands and will call back with any questions comments or concerns.

## 2023-11-20 ENCOUNTER — Encounter: Payer: Self-pay | Admitting: Internal Medicine

## 2023-11-20 ENCOUNTER — Ambulatory Visit (INDEPENDENT_AMBULATORY_CARE_PROVIDER_SITE_OTHER): Payer: Medicare Other | Admitting: Internal Medicine

## 2023-11-20 ENCOUNTER — Encounter: Payer: Self-pay | Admitting: Physician Assistant

## 2023-11-20 VITALS — BP 118/60 | HR 66 | Ht 68.0 in | Wt 128.2 lb

## 2023-11-20 DIAGNOSIS — M810 Age-related osteoporosis without current pathological fracture: Secondary | ICD-10-CM

## 2023-11-20 NOTE — Progress Notes (Signed)
 Patient ID: Yolanda Carroll, female   DOB: 05-21-1955, 68 y.o.   MRN: [id number]   HPI  Yolanda Carroll is a 68 y.o.-year-old female, initially referred by Dr. Rockney, presenting for follow-up for osteoporosis.  Last visit 8 mo ago.  Interim history: She has a history of severe scoliosis for which she had steroid injections in back due to significant back pain.  She had  T4-ilium fusion surgery in 12/01/2020 by Dr. Deward Cramp in [place]. She has muscle pain along her spine but also burning at the site of her surgery.  The pain has plateaued. She initially lost weight due to the pain.  She is working with nutrition >> gained weight before last visit.  She had severe D in 07/2023 >> suspected IBS >> trying to do the FODMAP diet.  She lost 9 pounds since last visit.  Now D alternating with C. She changed from Neurontin  to Lyrica 3 weeks ago >> some dizziness. She feels a little better. She also started acupuncture. No orthostasis/poor vision.   She is walking at least 20 min daily. Also, she just started weight bearing exercises.  Reviewed history: She was diagnosed with osteoporosis in early 2000.  Reviewed patient's DXA scan reports: Date L1-L4 T score FN T score 33% distal Radius (left)  Ultra distal radius (left)  10/31/2022 ([facility] [place]) N/a RFN: -2.4 (+8.2%*) LFN: -2.2 (+6.1%*) +0.7 (+3.1%) -0.1  10/19/2020 [facility]) N/a RFN: -2.8 (+5%) LFN: -2.5 (-1.4%) +0.4 (+4%) N/a  02/11/2019 ([facility]) N/a due to scoliosis RFN: -3.1 (-8.2%*) LFN: -2.4 (-4.7%)  -0.1 (-9.6%*)  0.0  02/06/2017 ([facility]) -0.3 (moderate scoliosis) RFN: -2.6 LFN: -2.2  +1.1 -0.1  12/29/2014 -0.5 (moderate scoliosis) RFN: -2.6 LFN: -1.7 n/a n/a  12/12/2012 -0.5 (moderate scoliosis) RFN: -2.4 LFN: -1.9 n/a n/a  10/04/2010 n/a RFN: -2.3 LFN: -1.8 n/a n/a   She has a history of steroid use: P.o. and IM for headaches, intraspinal for OA.  She had one episode of  vertigo for 2 weeks in 2018.   She had a  fall 03/19/2023 but no fractures  - lost balance. X-rays normal. No concussion.  In 07/2023, she had spine CT ordered by neurosurgery >> she was advised that she had no acute compression fractures.  Reviewing the report, there was a T8 inferior endplate irregularity with 35% height loss associated with sclerosis throughout the vertebral body concerning for compression fracture.  Reviewed previous osteoporosis treatments: - Actonel in 2006-2007 - Forteo for 2 years in 2008 - Boniva in 2010 - HRT: Estradiol  0.5, Provera  2.5 - now off - Prolia  -05/02/2019, 11/11/2019, 05/13/2020, 11/16/2020, 05/31/2021, 12/06/2021, 06/09/2022, 12/12/2022, 07/03/2023.  No history of vitamin D  deficiency: 03/02/2023: Vitamin D  58 02/15/2022: Vitamin D  55.8 02/10/2021: Vitamin D  65.2 Lab Results  Component Value Date   VD25OH 75.3 11/11/2020   VD25OH 49.3 12/04/2019   VD25OH 70.27 03/04/2019   VD25OH 55 03/13/2017  02/02/2020: Vitamin D  68.8 02/11/2018: Vit D 61.9 04/30/2017: vit D 53.4  She is on: - calcium citrate >> 250 mg 1-2 a day >> 500-600 mg daily >> 1 tab not quite every day - Vitamin D  >> approximately 2000 units daily (15,000 units a week) >> off >> ~1000 units  No weightbearing exercises. She walks and does water exercises. Also, weightbearing exercises.  She is not taking high vitamin A doses.  Menopause was at 83s y/o.   Pt does have a FH of osteoporosis in mother, who  had a hip fracture.  No history of kidney stones or hyperparathyroidism: Lab Results  Component Value Date   CALCIUM 9.5 10/19/2023  03/02/2023: Corrected calcium 8.94 02/15/2022: corrected calcium 8.69 02/02/2020: corrected calcium 8.74 (8.6-10.3)  No history of thyrotoxicosis: 08/03/2023: TSH 1.3 03/02/2023: TSH 3.82 02/15/2022: TSH 2.15 02/10/2021: TSH 3.9 02/02/2020: TSH 2.53 02/11/2018: TSH 2.79 05/01/2017: TSH 1.71 04/28/2013: TSH 2.063 No results found for: TSH   No  CKD. Last BUN/Cr: Lab Results  Component Value Date   BUN 4 (L) 10/19/2023   CREATININE 0.79 10/19/2023   On Depakote  for migraines. On opioids and Neurontin  for back pain. She sees pain management.  She has anxiety and sees Dr. Geoffry.  ROS: + See HPI  I reviewed pt's medications, allergies, PMH, social hx, family hx, and changes were documented in the history of present illness. Otherwise, unchanged from my initial visit note.  Past Medical History:  Diagnosis Date   Allergy    Anxiety    Arthritis    Cervical dysplasia    Diverticulitis    Endometriosis    Fatigue    Gallstones    GERD (gastroesophageal reflux disease)    History of hiatal hernia    pt states she currently has a hiatal hernia   HSV infection    oral   IBS (irritable bowel syndrome)    Migraine    Osteopenia    Osteoporosis 01/2019   T score -3.1   Osteoporosis    Plantar fasciitis    PONV (postoperative nausea and vomiting)    also difficult to wake up   Recurrent vaginitis    Reflux    Scoliosis    Wears glasses    Past Surgical History:  Procedure Laterality Date   ANTERIOR CERVICAL DECOMP/DISCECTOMY FUSION  04/17/2011   Procedure: ANTERIOR CERVICAL DECOMPRESSION/DISCECTOMY FUSION 2 LEVELS;  Surgeon: Lamar LELON Peaches;  Location: [facility];  Service: Neurosurgery;  Laterality: N/A;  Cervical five-six, six-seven anterior cervical decompression with fusion,  plating,  and bonegraft    APPENDECTOMY  1978   BACK SURGERY N/A    2022   BREAST BIOPSY  07/13/2011   Procedure: BREAST BIOPSY WITH NEEDLE LOCALIZATION;  Surgeon: Donnice Bury, MD;  Location: [facility];  Service: General;  Laterality: Right;  Right breast wire localization biopsy   BREAST EXCISIONAL BIOPSY Right 2013   BREAST SURGERY     Breast Bx-Benign   CATARACT EXTRACTION Bilateral 11/06/2022   CHOLECYSTECTOMY N/A 09/08/2021   Procedure: LAPAROSCOPIC CHOLECYSTECTOMY;  Surgeon: Vanderbilt Ned, MD;   Location: [facility];  Service: General;  Laterality: N/A;   CYSTOSCOPY     GYNECOLOGIC CRYOSURGERY     HERNIA REPAIR  08/02/1995   RIH   LAPAROSCOPIC ENDOMETRIOSIS FULGURATION  1997   PELVIC LAPAROSCOPY     ROTATOR CUFF REPAIR     right 2002 left 2000   Social History   Socioeconomic History   Marital status: Married    Spouse name: Ubaldo   Number of children: 0   Years of education: 16  Social Needs  Occupational History    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  Occupation: employed at [employer].    Caffeine - very little.   Current Outpatient Medications on File Prior to Visit  Medication Sig Dispense Refill   AIMOVIG  140 MG/ML SOAJ INJECT 140 MG INTO SKIN EVERY 30 DAYS 3 mL 3   ALPRAZolam  (XANAX ) 0.25 MG tablet Take 1 tablet (0.25 mg total) by mouth 3 (three) times daily as needed for anxiety. (Patient taking differently: Take 0.25 mg by mouth 3 (three) times daily as needed for anxiety. Usually 0.25 mg prn) 270 tablet 1   Ascorbic Acid (VITAMIN C PO) Take 500 mg by mouth daily.     Calcium Carbonate (CALCIUM 600 PO) Take 600-1,200 mg by mouth daily.     cholecalciferol (VITAMIN D3) 25 MCG (1000 UNIT) tablet Take 1,000 Units by mouth daily.     denosumab  (PROLIA ) 60 MG/ML SOSY injection      DEPAKOTE  ER 250 MG 24 hr tablet TAKE 1 TABLET BY MOUTH DAILY 30 tablet 11   estradiol  (ESTRACE ) 0.1  MG/GM vaginal cream Place 0.5 g vaginally 2 (two) times a week.     famotidine  (PEPCID ) 40 MG tablet TAKE 1 TABLET BY MOUTH DAILY 30 tablet 3   HYDROcodone -acetaminophen  (NORCO) 7.5-325 MG tablet Take 1.5 tablets by mouth every 6 (six) hours as needed for moderate pain.     IMVEXXY  MAINTENANCE PACK 4 MCG INST Place vaginally.     Lactobacillus (ACIDOPHILUS) 100 MG CAPS Take 100 mg by mouth daily.     methocarbamol  (ROBAXIN ) 750 MG tablet Take 750 mg by mouth 3 (three) times daily.     mirtazapine  (REMERON ) 7.5 MG tablet TAKE 1 AND 1/2 TABLETS BY MOUTH  AT NIGHT (DISCONTINUE  AMITRIPTYLINE ) 135 tablet 0   Multiple Vitamin (MULTIVITAMIN) tablet Take 1 tablet by mouth daily.     ondansetron  (ZOFRAN ) 4 MG tablet Take 1 tablet (4 mg total) by mouth 2 (two) times daily as needed for nausea or vomiting. 60 tablet 1   polyethylene glycol (MIRALAX / GLYCOLAX) 17 g packet Take 17 g by mouth daily.     pregabalin (LYRICA) 100 MG capsule Take 100 mg by mouth 3 (three) times daily.     PREVIDENT 5000 SENSITIVE 1.1-5 % GEL Place onto teeth.     Probiotic Product (RESTORA PO) Take by mouth.     Rimegepant Sulfate (NURTEC) 75 MG TBDP Take 1 tablet (75 mg total) by mouth daily as needed (take for abortive therapy of migraine, no more than 1 tablet in 24 hours or 10 per month). 8 tablet 11   Current Facility-Administered Medications on File Prior to Visit  Medication Dose Route Frequency Provider Last Rate Last Admin   [START ON 12/31/2023] denosumab  (PROLIA ) injection 60 mg  60 mg Subcutaneous Once Dian Minahan, MD       Allergies  Allergen Reactions   Penicillins Other (See Comments)    Seizures as a child   Atarax  [Hydroxyzine ] Other (See Comments)    Headache, depression   Bactrim  [Sulfamethoxazole -Trimethoprim ] Nausea Only   Celexa [Citalopram Hydrobromide] Other (See Comments)    Chest pain   Erythromycin      Upset stomach   Flagyl  [Metronidazole ]  Dizzy and increased heart rate    Nortriptyline Itching   Orphenadrine     Hand tremors   Prilosec [Omeprazole]     Abdominal pain   Requip [Ropinirole]     Made sx worse   Darvocet [Propoxyphene N-Acetaminophen ] Itching   Percocet [Oxycodone -Acetaminophen ] Itching   Family History  Problem Relation Age of Onset   Cancer Father        lymphoma   Other Mother        bipolar,reflux   Bipolar disorder Mother    Other Brother        sinus problems   Cancer Maternal Aunt        uterine cancer   Breast cancer Maternal Aunt        40's   Diabetes Maternal Aunt    Cancer Paternal Aunt        Colon cancer   Breast cancer Cousin 50       Mat. 1st cousin   PE: BP 118/60   Pulse 66   Ht 5' 8 (1.727 m)   Wt 128 lb 3.2 oz (58.2 kg)   SpO2 98%   BMI 19.49 kg/m  Wt Readings from Last 10 Encounters:  11/20/23 128 lb 3.2 oz (58.2 kg)  10/24/23 126 lb 6.4 oz (57.3 kg)  10/19/23 126 lb 2 oz (57.2 kg)  09/06/23 129 lb (58.5 kg)  07/09/23 125 lb (56.7 kg)  04/02/23 137 lb (62.1 kg)  12/12/22 134 lb 12.8 oz (61.1 kg)  10/24/22 134 lb 8 oz (61 kg)  10/12/22 131 lb (59.4 kg)  05/01/22 124 lb 6 oz (56.4 kg)   Constitutional:  thin, in NAD Eyes: no exophthalmos ENT: no thyromegaly, no cervical lymphadenopathy Cardiovascular: RRR, No MRG Respiratory: CTA B Musculoskeletal: no deformities - no scoliosis Skin: no rashes Neurological: + tremor with outstretched hands  Assessment: 1. Osteoporosis  Plan: 1. Osteoporosis -Likely age-related + postmenopausal + she also has a family history of osteoporosis -She had no falls or fractures since last visit -Reviewing her bone density reports from 01/2018 and 01/2019, both femoral neck T-scores decreased, while only the right femoral neck decrease was significant.  She also had a decreased bone density at the radius level.  The spine area could not be analyzed in 2020 due to scoliosis.  After these results, she agreed to start Prolia  (04/2019).  She initially had back pain,  but this was chronic for her and I did not feel that this was related to Prolia .  She was able to continue with the medication.  Her bone density scan from 11/2020 showed stability of her T-scores.  She had another bone density scan in 10/2022 (on another machine), and this showed increases in all of her T-scores, an excellent result. -She tolerates Prolia  well, without jaw/hip/thigh pain.  She had joint pains after Prolia  injection last time, but she attributes this to being cold and rainy when she had the study in February.  She had CT evaluation of her vertebrae by orthopedic doctor and she had endplate irregularity of the T8 vertebral at that time (consistent with a compression fracture) but she was advised at that time that there was no acute fracture. -We discussed about continuing Prolia  for 10 years or more, if needed.  She was previously contemplating stopping osteoporosis therapy but I strongly advised her not to do so.  We also discussed that if coming off Prolia , we will need to give her another medication to seal the Prolia  effect  on the bone and to avoid an abrupt decrease in bone density and an increase in the risk of fracture.  If we need to stop it in the future, we discussed that she may need 1 or 2 doses of Reclast.  We also discussed that we may try Forteo again in the future.  The duration of treatment was previously limited to 2 years, but the strict 2-year lifetime limitation of treatment was lifted by the FDA in 2020. -Of note, she has a history of scoliosis surgery in 11/2020 but she still has significant back pain afterwards.  Her pain actually increased over time, despite physical therapy, water aerobics so at last visit we discussed about trying to increase weightbearing exercises to strengthen her back muscles.  She was able to gain 18 pounds before last visit, which was excellent. -She is on vitamin D  1000 units daily with the latest level being normal in 02/2023, at 58.  And we  will repeat this today - Also, calcium level was normal in 10/2023, 9.5,  and kidney function was also at goal at that time -I recommended at least 1000-1200 mg of calcium from the diet and supplements and to supplement with calcium citrate if she felt that she was not getting this amount from the diet.  She is on an H2 blocker which I explained can decrease the absorption of calcium carbonate. -Plan to repeat another bone density next year.  Ideally we would obtain this on the same machine, however, her OB/GYN provider retired and she changed doctors >> now [facility] - I will see her back in a year -will order new bone density scan at that time  Orders Placed This Encounter  Procedures   VITAMIN D  25 Hydroxy (Vit-D Deficiency, Fractures)   - Total time spent for the visit: 30 min, in precharting, postcharting, reviewing pertinent records, obtaining medical information from the chart and from the pt, reviewing her  previous labs, imaging evaluations, and treatments, reviewing her symptoms, counseling her about her osteoporosis (please see the discussed topics above), and developing a plan to further investigate and treat it.  Lela Fendt, MD PhD [facility] Endocrinology

## 2023-11-20 NOTE — Patient Instructions (Addendum)
 Please continue with Prolia  as discussed.  Continue vitamin D  1000 units daily.   If you are not getting at least 1000-1200 mg calcium from the diet, add calcium citrate 500 mg daily.  Please stop at the lab.  Please come back for a follow-up appointment in 1 year.

## 2023-11-21 ENCOUNTER — Ambulatory Visit: Payer: Self-pay | Admitting: Internal Medicine

## 2023-11-21 LAB — VITAMIN D 25 HYDROXY (VIT D DEFICIENCY, FRACTURES): Vit D, 25-Hydroxy: 71 ng/mL (ref 30–100)

## 2023-11-22 MED ORDER — NURTEC 75 MG PO TBDP: 75.0000 mg | ORAL_TABLET | Freq: Every day | ORAL | Status: AC | PRN

## 2023-11-22 NOTE — Addendum Note (Signed)
 Addended by: HILLIARD HEATHER CROME on: 11/22/2023 03:33 PM   Modules accepted: Orders

## 2023-11-22 NOTE — Telephone Encounter (Signed)
 Spoke with Amy, ok to provide 4 boxes of samples this time. Order written and samples placed up front for pickup.

## 2023-11-23 ENCOUNTER — Ambulatory Visit (INDEPENDENT_AMBULATORY_CARE_PROVIDER_SITE_OTHER): Admitting: Psychiatry

## 2023-11-23 DIAGNOSIS — F411 Generalized anxiety disorder: Secondary | ICD-10-CM

## 2023-11-23 NOTE — Progress Notes (Signed)
 [facility] Counselor/Therapist Progress Note  Patient ID: Yolanda Carroll, MRN: [id number],    Date: 11/23/2023  Time Spent: 52 minutes   Treatment Type: Individual Therapy  Reported Symptoms: anxiety, some depression, frustration   Mental Status Exam:  Appearance:   Casual     Behavior:  Appropriate, Sharing, and Motivated  Motor:  Normal  Speech/Language:   Clear and Coherent  Affect:  Anxious, some depression  Mood:  anxious and depressed  Thought process:  goal directed  Thought content:    WNL  Sensory/Perceptual disturbances:    WNL  Orientation:  oriented to person, place, time/date, situation, day of week, month of year, year, and stated date of November 23, 2023  Attention:  Fair  Concentration:  Fair  Memory:  WNL  Fund of knowledge:   Good  Insight:    Good  Judgment:   Good  Impulse Control:  Good   Risk Assessment: Danger to Self:  No Self-injurious Behavior: No Danger to Others: No Duty to Warn:no Physical Aggression / Violence:No  Access to Firearms a concern: No  Gang Involvement:No   Subjective:    Patient today working in session further on her anxiety, depression, and frustration related primarily to her ongoing health issues. Was able to get out to a community event recently. Started acupuncture and has had 3 treatments thus far and is optimistic about those. Biggest anxiety right now is my sex life and patient talked through multiple concerns that affect her emotionally and physically. (Not all details included in this note due to patient privacy needs.) Always been a glass is half-full person and I'm trying to get back to that.  Is working hard to do this and we continued that work in session today.  Patient smiling more, showing more motivation, and some increased hope for herself.  Concentration is better.  Some decrease in her worrying.  Using visualizations some with positive outcome.  Continues to use some guided imagery, remaining close in  contact with friends and family, and eating healthier.  Appreciates being able to come and talk openly and confidentially.  Continues to have an active prayer life praying for herself and others.  Encouraged patient to keep using some of the coping tools that we have spoken about in sessions including journaling and connecting with friends who are understanding and trustful.  Both her anger and frustration have decreased.   Interventions: Cognitive Behavioral Therapy, Solution-Oriented/Positive Psychology, and Ego-Supportive Long-term goal:  Elevate mood and show evidence of usual energy, activities, and socialization level. Short-term goal: Verbally identify the source of depressed mood and work to decrease its impact. Strategies: Identify cognitive self-talk that tends to support depression.  Verbalize hopeful and positive statements regarding her has not and future    Diagnosis:   ICD-10-CM   1. Generalized anxiety disorder  F41.1      Plan:   Patient in session today and actively participating in session the entire time, reflecting on a better energy level at least for this morning.  Having some less pain and getting out a little more which is encouraging for her.  As noted above she has made meaningful progress.  Decreased pain.  Less depressed and less frustrated.  Uses tools and strategies as discussed in sessions with benefit.  Encouraged to continue her breathing exercises, mindfulness, some other physical exercise as she is able and with doctors permission, frequent contact with trusting friends, and visualizations.  Goal review and progress/challenges noted with  patient.  Next appointment within approximately 4-5 weeks.   Barnie Bunde, LCSW

## 2023-11-26 NOTE — Progress Notes (Unsigned)
 Yolanda Console, PA-C [address] [place], [place]  [address] Phone: [phone]   Primary Care Physician: Verena Mems, MD  Primary Gastroenterologist:  Yolanda Console, PA-C / Yolanda Holt, MD   Chief Complaint:  F/U IBS, Constipation, LLQ Pain, and GERD       HPI:   Yolanda Carroll is a 68 y.o. female returns for 5-week follow-up of irritable bowel syndrome.  She was having worsening LLQ pain and diarrhea at her last visit.  She is overdue for a 10-year repeat screening colonoscopy.  She was previously scheduled for colonoscopy, however we postpone procedure until she completed a CT scan to rule out diverticulitis.  She was also previously scheduled for EGD due to GERD and voice hoarseness.  She is ready to reschedule colonoscopy and EGD with Dr. Holt.  She is here today with her husband who helps with her care.  Abdominal pelvic CT 11/07/2023 showed large stool burden consistent with constipation.  No other acute abnormality.  No evidence of diverticulitis or IBD.  10/19/2023 labs: Normal CBC, CMP, CRP.  Hgb 13.1, WBC 4.5.  She has been on hydrocodone  for 3 years due to chronic back pain s/p back surgery.  She typically takes MiraLAX for chronic constipation.  Did not tolerate IBgard which caused worse acid reflux.  Currently taking Restora probiotic and Imodium as needed.  Takes Zofran  4 Mg as needed for nausea.     PMH:  History of GERD, IBS, anxiety, depression, endometriosis, osteoporosis, long COVID.  Previous cholecystectomy.     10/2021 CT abdomen pelvis with contrast (for nausea, weight loss, endometriosis): 1. No acute intra-abdominal pathology identified. No definite radiographic explanation for the patient's reported symptoms. 2. Moderate stool throughout the colon without evidence of obstruction.   Flexible sigmoidoscopy (Dr. Donnald) March 25, 2020 Indication: Rectal hemorrhage Normal colon, nonbleeding internal hemorrhoids, presumed to be  source of patient's recent bleeding   EGD Sep 16, 2019 (Dr. Donnald) Indication: Esophageal reflux, weight loss Normal esophagus, normal stomach, normal duodenum Biopsies: Chronic inactive gastritis, normal duodenal biopsies (NO celiac), normal esophageal biopsies   EGD March 25, 2020 (Dr. Donnald) Indication: Nausea Normal esophagus, normal stomach, normal duodenum Normal biopsies   Colonoscopy July 22, 2013 (Dr. Donnald) Sigmoid diverticulosis, 4 small polyps in the ascending colon, removed with forceps. Pathology consistent with lymphoid aggregates Recommended repeat colonoscopy in 10 years  Current Outpatient Medications  Medication Sig Dispense Refill   AIMOVIG  140 MG/ML SOAJ INJECT 140 MG INTO SKIN EVERY 30 DAYS 3 mL 3   ALPRAZolam  (XANAX ) 0.25 MG tablet Take 1 tablet (0.25 mg total) by mouth 3 (three) times daily as needed for anxiety. (Patient taking differently: Take 0.25 mg by mouth 3 (three) times daily as needed for anxiety. Usually 0.25 mg prn) 270 tablet 1   Ascorbic Acid (VITAMIN C PO) Take 500 mg by mouth daily.     Calcium Carbonate (CALCIUM 600 PO) Take 600-1,200 mg by mouth daily.     cholecalciferol (VITAMIN D3) 25 MCG (1000 UNIT) tablet Take 1,000 Units by mouth daily.     denosumab  (PROLIA ) 60 MG/ML SOSY injection      DEPAKOTE  ER 250 MG 24 hr tablet TAKE 1 TABLET BY MOUTH DAILY 30 tablet 11   estradiol  (ESTRACE ) 0.1 MG/GM vaginal cream Place 0.5 g vaginally 2 (two) times a week.     famotidine  (PEPCID ) 40 MG tablet TAKE 1 TABLET BY MOUTH DAILY 30 tablet 3   HYDROcodone -acetaminophen  (NORCO) 7.5-325  MG tablet Take 1.5 tablets by mouth every 6 (six) hours as needed for moderate pain.     IMVEXXY  MAINTENANCE PACK 4 MCG INST Place vaginally.     Lactobacillus (ACIDOPHILUS) 100 MG CAPS Take 100 mg by mouth daily.     methocarbamol  (ROBAXIN ) 750 MG tablet Take 750 mg by mouth 3 (three) times daily.     mirtazapine  (REMERON ) 7.5 MG tablet TAKE 1 AND 1/2 TABLETS BY  MOUTH  AT NIGHT (DISCONTINUE  AMITRIPTYLINE ) 135 tablet 0   Multiple Vitamin (MULTIVITAMIN) tablet Take 1 tablet by mouth daily.     ondansetron  (ZOFRAN ) 4 MG tablet Take 1 tablet (4 mg total) by mouth 2 (two) times daily as needed for nausea or vomiting. 60 tablet 1   polyethylene glycol (MIRALAX / GLYCOLAX) 17 g packet Take 17 g by mouth daily.     pregabalin (LYRICA) 100 MG capsule Take 100 mg by mouth 3 (three) times daily.     PREVIDENT 5000 SENSITIVE 1.1-5 % GEL Place onto teeth.     Probiotic Product (RESTORA PO) Take by mouth.     Rimegepant Sulfate (NURTEC) 75 MG TBDP Take 1 tablet (75 mg total) by mouth daily as needed (take for abortive therapy of migraine, no more than 1 tablet in 24 hours or 10 per month). 8 tablet    Current Facility-Administered Medications  Medication Dose Route Frequency Provider Last Rate Last Admin   [START ON 12/31/2023] denosumab  (PROLIA ) injection 60 mg  60 mg Subcutaneous Once Gherghe, Cristina, MD        Allergies as of 11/27/2023 - Review Complete 11/27/2023  Allergen Reaction Noted   Penicillins Other (See Comments) 11/30/2010   Atarax  [hydroxyzine ] Other (See Comments) 01/08/2017   Bactrim  [sulfamethoxazole -trimethoprim ] Nausea Only 08/25/2021   Celexa [citalopram hydrobromide] Other (See Comments) 02/21/2012   Erythromycin  01/05/2017   Flagyl  [metronidazole ]  10/22/2017   Nortriptyline Itching 04/17/2011   Orphenadrine  08/25/2021   Prilosec [omeprazole]  01/05/2017   Requip [ropinirole]  01/05/2017   Darvocet [propoxyphene n-acetaminophen ] Itching 11/30/2010   Percocet [oxycodone -acetaminophen ] Itching 11/30/2010    Past Medical History:  Diagnosis Date   Allergy    Anxiety    Arthritis    Cervical dysplasia    Diverticulitis    Endometriosis    Fatigue    Gallstones    GERD (gastroesophageal reflux disease)    History of hiatal hernia    pt states she currently has a hiatal hernia   HSV infection    oral   IBS (irritable  bowel syndrome)    Migraine    Osteopenia    Osteoporosis 01/2019   T score -3.1   Osteoporosis    Plantar fasciitis    PONV (postoperative nausea and vomiting)    also difficult to wake up   Recurrent vaginitis    Reflux    Scoliosis    Wears glasses     Past Surgical History:  Procedure Laterality Date   ANTERIOR CERVICAL DECOMP/DISCECTOMY FUSION  04/17/2011   Procedure: ANTERIOR CERVICAL DECOMPRESSION/DISCECTOMY FUSION 2 LEVELS;  Surgeon: Lamar LELON Peaches;  Location: [facility];  Service: Neurosurgery;  Laterality: N/A;  Cervical five-six, six-seven anterior cervical decompression with fusion,  plating,  and bonegraft    APPENDECTOMY  1978   BACK SURGERY N/A    2022   BREAST BIOPSY  07/13/2011   Procedure: BREAST BIOPSY WITH NEEDLE LOCALIZATION;  Surgeon: Donnice Bury, MD;  Location: [facility];  Service: General;  Laterality: Right;  Right breast wire localization biopsy   BREAST EXCISIONAL BIOPSY Right 2013   BREAST SURGERY     Breast Bx-Benign   CATARACT EXTRACTION Bilateral 11/06/2022   CHOLECYSTECTOMY N/A 09/08/2021   Procedure: LAPAROSCOPIC CHOLECYSTECTOMY;  Surgeon: Vanderbilt Ned, MD;  Location: [facility];  Service: General;  Laterality: N/A;   CYSTOSCOPY     GYNECOLOGIC CRYOSURGERY     HERNIA REPAIR  08/02/1995   RIH   LAPAROSCOPIC ENDOMETRIOSIS FULGURATION  1997   PELVIC LAPAROSCOPY     ROTATOR CUFF REPAIR     right 2002 left 2000    Review of Systems:    All systems reviewed and negative except where noted in HPI.    Physical Exam:  BP (!) 82/55   Pulse (!) 57   Ht 5' 8 (1.727 m)   Wt 128 lb (58.1 kg)   BMI 19.46 kg/m  No LMP recorded. Patient is postmenopausal.  General: Well-nourished, well-developed in no acute distress.  Lungs: Clear to auscultation bilaterally. Non-labored. Heart: Regular rate and rhythm, no murmurs rubs or gallops.  Abdomen: Bowel sounds are normal; Abdomen is Soft; No hepatosplenomegaly, masses or  hernias;  Mild LLQ Abdominal Tenderness; Rest of abdomen is not tender.  No guarding or rebound tenderness. Neuro: Alert and oriented x 3.  Grossly intact.  Psych: Alert and cooperative, normal mood and affect.   Imaging Studies: CT ABDOMEN PELVIS W CONTRAST Result Date: 11/07/2023 CLINICAL DATA:  Left lower quadrant pain for several months. Constipation and diarrhea. EXAM: CT ABDOMEN AND PELVIS WITH CONTRAST TECHNIQUE: Multidetector CT imaging of the abdomen and pelvis was performed using the standard protocol following bolus administration of intravenous contrast. RADIATION DOSE REDUCTION: This exam was performed according to the departmental dose-optimization program which includes automated exposure control, adjustment of the mA and/or kV according to patient size and/or use of iterative reconstruction technique. CONTRAST:  OMNIPAQUE  IOHEXOL  300 MG/ML  SOLN COMPARISON:  11/04/2021 FINDINGS: Lower Chest: No acute findings. Hepatobiliary: No suspicious hepatic masses identified. Prior cholecystectomy. No evidence of biliary obstruction. Pancreas:  No mass or inflammatory changes. Spleen: Within normal limits in size and appearance. Adrenals/Urinary Tract: No suspicious masses identified. No evidence of ureteral calculi or hydronephrosis. Stomach/Bowel: No evidence of obstruction, inflammatory process or abnormal fluid collections. Large stool burden noted throughout the colon. Vascular/Lymphatic: No pathologically enlarged lymph nodes. No acute vascular findings. Reproductive:  No mass or other significant abnormality. Other:  None. Musculoskeletal: No suspicious bone lesions identified. Thoracolumbar spine degenerative changes, scoliosis, and posterior spinal fixation rods again noted. IMPRESSION: No acute findings within the abdomen or pelvis. Large stool burden noted, consistent with clinical history of constipation. Electronically Signed   By: Norleen DELENA Kil M.D.   On: 11/07/2023 11:36     Labs: CBC    Component Value Date/Time   WBC 4.5 10/19/2023 1130   RBC 4.35 10/19/2023 1130   HGB 13.1 10/19/2023 1130   HCT 39.3 10/19/2023 1130   PLT 250.0 10/19/2023 1130   MCV 90.3 10/19/2023 1130   MCH 30.6 08/31/2021 1100   MCHC 33.3 10/19/2023 1130   RDW 13.0 10/19/2023 1130   LYMPHSABS 1.3 10/19/2023 1130   MONOABS 0.6 10/19/2023 1130   EOSABS 0.0 10/19/2023 1130   BASOSABS 0.0 10/19/2023 1130    CMP     Component Value Date/Time   NA 139 10/19/2023 1130   NA 134 03/26/2020 1056   K 4.3 10/19/2023 1130   CL 99 10/19/2023 1130   CO2  33 (H) 10/19/2023 1130   GLUCOSE 101 (H) 10/19/2023 1130   BUN 4 (L) 10/19/2023 1130   BUN 8 03/26/2020 1056   CREATININE 0.79 10/19/2023 1130   CALCIUM 9.5 10/19/2023 1130   PROT 7.5 10/19/2023 1130   ALBUMIN 4.8 10/19/2023 1130   AST 18 10/19/2023 1130   ALT 13 10/19/2023 1130   ALKPHOS 34 (L) 10/19/2023 1130   BILITOT 0.3 10/19/2023 1130   GFRNONAA >60 08/31/2021 1100   GFRAA 91 03/26/2020 1056    Assessment and Plan:   BRITISH MOYD is a 68 y.o. y/o female returns for follow-up of:  1.  Irritable bowel syndrome.  Appears to be more constipation predominant.  She may have overflow diarrhea from constipation.  Recent abdominal pelvic CT (to evaluate LLQ pain) showed no evidence of diverticulitis.  There was moderate stool throughout the colon consistent with constipation. - For Constipation Try: Samples of Linzess 72 mcg QD for 1 week,  then 145 mcg QD for 1 week,  Please let me know which dose works best, and then we can send in a prescription.     - Continue low FODMAP diet - Continue Restora Probiotic  2. Opiod Induced Constipation - She will f/u with her pain management physician to discuss decreasing pain meds.  3.  LLQ Pain: Recent Abdominal / Pelvic CT showed constipation.  NO diverticulitis.  No acute abnormality.  She tried IB Guard and unable to tolerate - caused worsening GERD. - Start Linzess - Rx  Hyosciamine 0.125mg  SL Q 6 hours sparingly prn severe pain, #30, No Refills. - I discussed adverse side effects of hyosciamine with patient and her husband including drowsiness, increased sedation.    4.  Colon cancer screening: Last colonoscopy March 2015 by Dr. Donnald was negative.  Overdue for 10-year repeat screening colonoscopy - Scheduling Colonoscopy I discussed risks of colonoscopy with patient to include risk of bleeding, colon perforation, and risk of sedation.  Patient expressed understanding and agrees to proceed with colonoscopy.   5.  GERD / Voice Hoarseness - Scheduling EGD (per Dr. London prior recommendation). I discussed risks of EGD with patient to include risk of bleeding, perforation, and risk of sedation.  Patient expressed understanding and agrees to proceed with EGD.   Yolanda Console, PA-C  Follow up as needed based on procedure results and GI symptoms.

## 2023-11-27 ENCOUNTER — Encounter: Payer: Self-pay | Admitting: Physician Assistant

## 2023-11-27 ENCOUNTER — Ambulatory Visit (INDEPENDENT_AMBULATORY_CARE_PROVIDER_SITE_OTHER): Admitting: Physician Assistant

## 2023-11-27 VITALS — BP 82/55 | HR 57 | Ht 68.0 in | Wt 128.0 lb

## 2023-11-27 DIAGNOSIS — K5903 Drug induced constipation: Secondary | ICD-10-CM | POA: Diagnosis not present

## 2023-11-27 DIAGNOSIS — K582 Mixed irritable bowel syndrome: Secondary | ICD-10-CM

## 2023-11-27 DIAGNOSIS — T402X5A Adverse effect of other opioids, initial encounter: Secondary | ICD-10-CM | POA: Diagnosis not present

## 2023-11-27 DIAGNOSIS — R1032 Left lower quadrant pain: Secondary | ICD-10-CM

## 2023-11-27 DIAGNOSIS — K581 Irritable bowel syndrome with constipation: Secondary | ICD-10-CM

## 2023-11-27 DIAGNOSIS — K219 Gastro-esophageal reflux disease without esophagitis: Secondary | ICD-10-CM

## 2023-11-27 DIAGNOSIS — Z1211 Encounter for screening for malignant neoplasm of colon: Secondary | ICD-10-CM

## 2023-11-27 MED ORDER — HYOSCYAMINE SULFATE 0.125 MG SL SUBL: 0.1250 mg | SUBLINGUAL_TABLET | Freq: Four times a day (QID) | SUBLINGUAL | 0 refills | Status: DC | PRN

## 2023-11-27 NOTE — Patient Instructions (Addendum)
 We have sent the following medications to your pharmacy for you to pick up at your convenience: Hyoscyamine  0.125 mg every 6 hours as needed   For Constipation Try: Samples of Linzess 72 mcg 1 tablet once daily for 1 week,  then Linzess 145 mcg  for1 tablet once daily 1 week.   Please let me know which dose works best, and then we can send in a prescription.  You have been scheduled for an Endoscopy and Colonoscopy. Please follow the written instructions given to you at your visit today.  If you use inhalers (even only as needed), please bring them with you on the day of your procedure.  DO NOT TAKE 7 DAYS PRIOR TO TEST- Trulicity (dulaglutide) Ozempic, Wegovy (semaglutide) Mounjaro (tirzepatide) Bydureon Bcise (exanatide extended release)  DO NOT TAKE 1 DAY PRIOR TO YOUR TEST Rybelsus (semaglutide) Adlyxin (lixisenatide) Victoza (liraglutide) Byetta (exanatide) ___________________________________________________________________________  Please follow up sooner if symptoms increase or worsen __________________________________________________________________________  Due to recent changes in healthcare laws, you may see the results of your imaging and laboratory studies on MyChart before your provider has had a chance to review them.  We understand that in some cases there may be results that are confusing or concerning to you. Not all laboratory results come back in the same time frame and the provider may be waiting for multiple results in order to interpret others.  Please give us  48 hours in order for your provider to thoroughly review all the results before contacting the office for clarification of your results.   Thank you for trusting me with your gastrointestinal care!   Ellouise Console, PA-C _______________________________________________________  If your blood pressure at your visit was 140/90 or greater, please contact your primary care physician to follow up on  this.  _______________________________________________________  If you are age 53 or older, your body mass index should be between 23-30. Your Body mass index is 19.46 kg/m. If this is out of the aforementioned range listed, please consider follow up with your Primary Care Provider.  If you are age 36 or younger, your body mass index should be between 19-25. Your Body mass index is 19.46 kg/m. If this is out of the aformentioned range listed, please consider follow up with your Primary Care Provider.   ________________________________________________________  The [facility] providers would like to encourage you to use MYCHART to communicate with providers for non-urgent requests or questions.  Due to long hold times on the telephone, sending your provider a message by [facility] may be a faster and more efficient way to get a response.  Please allow 48 business hours for a response.  Please remember that this is for non-urgent requests.  _______________________________________________________

## 2023-12-07 MED ORDER — LINACLOTIDE 290 MCG PO CAPS: 290.0000 ug | ORAL_CAPSULE | Freq: Every day | ORAL | 2 refills | Status: DC

## 2023-12-09 NOTE — Progress Notes (Signed)
 Agree with the assessment and plan as outlined by Brigitte Canard, PA-C.

## 2023-12-10 NOTE — Telephone Encounter (Signed)
 Patient is scheduled for 08.19.25 - does there need to be an additional authorization done and if not is $257 still the amount due for this injection

## 2023-12-11 NOTE — Telephone Encounter (Signed)
 Prolia  VOB initiated via [email or web address]  Next Prolia  inj DUE: 01/01/24

## 2023-12-15 NOTE — Telephone Encounter (Signed)
 [employer]  Patient is ready for scheduling on or after 01/01/24  Out-of-pocket cost due at time of visit: $0  Primary: [place]  Medicare Prolia  co-insurance: 20% (approximately $331.87) Admin fee co-insurance: 20% (approximately $25)  Deductible: $257 of $257 met  Prior Auth: NOT required  Secondary: [employer] AARP Medicare Supplement Plan G Prolia  co-insurance: Covers Medicare Part B co-insurance Admin fee co-insurance: Covers Medicare Part B co-insurance  Deductible: does NOT cover Medicare deductible of which $257 of $257 has been met  Prior Auth: NOT required PA# Valid:   ** This summary of benefits is an estimation of the patient's out-of-pocket cost. Exact cost may vary based on individual plan coverage.

## 2023-12-17 ENCOUNTER — Ambulatory Visit (INDEPENDENT_AMBULATORY_CARE_PROVIDER_SITE_OTHER): Admitting: Psychiatry

## 2023-12-17 ENCOUNTER — Encounter: Payer: Self-pay | Admitting: Psychiatry

## 2023-12-17 DIAGNOSIS — F411 Generalized anxiety disorder: Secondary | ICD-10-CM

## 2023-12-17 DIAGNOSIS — K219 Gastro-esophageal reflux disease without esophagitis: Secondary | ICD-10-CM

## 2023-12-17 DIAGNOSIS — F5105 Insomnia due to other mental disorder: Secondary | ICD-10-CM | POA: Diagnosis not present

## 2023-12-17 DIAGNOSIS — F3342 Major depressive disorder, recurrent, in full remission: Secondary | ICD-10-CM

## 2023-12-17 DIAGNOSIS — F410 Panic disorder [episodic paroxysmal anxiety] without agoraphobia: Secondary | ICD-10-CM

## 2023-12-17 DIAGNOSIS — G43009 Migraine without aura, not intractable, without status migrainosus: Secondary | ICD-10-CM

## 2023-12-17 MED ORDER — MIRTAZAPINE 7.5 MG PO TABS: ORAL_TABLET | ORAL | 1 refills | Status: DC

## 2023-12-17 MED ORDER — DEPAKOTE ER 250 MG PO TB24: 250.0000 mg | ORAL_TABLET | Freq: Every day | ORAL | 3 refills | Status: DC

## 2023-12-17 MED ORDER — ALPRAZOLAM 0.25 MG PO TABS
0.2500 mg | ORAL_TABLET | Freq: Three times a day (TID) | ORAL | 1 refills | Status: AC | PRN
Start: 2023-12-17 — End: ?

## 2023-12-17 NOTE — Progress Notes (Signed)
 [name] [id number] 1955/06/20 68 y.o.   Subjective:   Patient ID:  Yolanda Carroll is a 67 y.o. (DOB 08-Sep-1955) female.  Chief Complaint:  Chief Complaint  Patient presents with   Follow-up    Yolanda Carroll presents to the office today for follow-up of anxiety and migraine.  seen in July 2020.  No meds were changed.  She requires name brand Depakote  because the generic caused worsening headaches.  Retired November 14, 2018.  Was so burned out at work.  Helping with church projects and helping friends.  Focusing on her health and doing PT for back and hip.  No kids or gkids.  Helps care for 34 yo M-in-law.  Yolanda Carroll is also busy which helps.  Given a surprise retirement party.     seen January 2021.  Nefazodone  is no longer manufactured and she had to wean off.  We also discussed the potential of weaning off Depakote  because Aimovig  had been very effective at managing her migraine headache.  She called August 11, 2019 stating that she did taper off of Depakote  because she did not feel like she needed it any more.  Her last dose was July 23, 2019 but afterwards noticed heartburn and swallowing issues.  Her GI doctor added Pepcid  40 mg daily.  Her neurologist had indicated that Depakote  can sometimes help with esophageal issues and she wondered if that was connected.  She was given the option to restart a lower dose and perhaps taper more slowly.  Off Depakote  for a month.  She elected not to restart it.  08/19/2019 appointment the following is noted: Tinnitus and mild anxiety worse off the Depakote  and with some mind racing esp in the AM.   Most is better. Reflux is worse off Depakote  ER 250 and wondering if she should restart it.   Disc article on Depakote  helping GERD by increasing lower esophageal sphincter tone. Has travelled some with family events.  M-in-law 92 soon.   HA good with Aimovig . Don't do season changes well and more HA in Spring often. Sleep not great lately with leg  cramps.  Started more exercise.  Started snoring and Yolanda Carroll is a light sleeper.  Meds help. Plan no med changes.  10/03/2019 phone call with patient asking to restart residual nefazodone  that she has on hand.  She felt that it helped back pain and she is experiencing more back pain recently.  She is aware that it is no longer manufactured to our knowledge and once she runs out it will not be available.  She indicated she had an off to take half of 150 mg nefazodone  tablet for about 2 to 3 months and would like to do so.  11/05/2019 appointment with the following noted:  Appt moved earlier DT the following. Had stopped Depakote  and nefazadone and reflux got worse.  Restarting Depakote  didn't help much.  Restarted nefazodaone and reflux better right away.  May not need Depakote  but doesn't want to change.  Can't exercise DT back pain since Jan ans worse since March.  Getting new doctor.  More depressed and anxious and sleep problems DT back pain.   Usually only 0.25 mg alprazolam  at night. Plan: has to stop nefazodone  when it runs out bc no longer manufactured.  Therefore will try trazodone in it's place.  01/07/20 appt with the following noted: She and Yolanda Carroll both got Covid.  Was pretty sick for 10 days.  Lost taste.  Had little resp stuff but severe  diarrhea and nausea and couldn't keep fluids down.  No residual sx. Yolanda Carroll had resp sx with pneumonia and had to get O2.  Hosp for 3 days. Still have some nefazodone  and nursing it along. Tried trazodone 50 HS for 2 nights and didn't sleep well and had a HA  Nefazodone  and Depakote  both helped the GERD.  She has enough nefazadone for about 6 weeks. Plan: Nefazodone  helped the reflux and she wants to take it as long as possible.  She didn't have withdrawal off nefazodone . When she runs out then start trazodone.  If that fails then use Viibryd.  Disc SE and alternatives.   Rec try trazodone again but use 100 mg and see if sleeping better prevents the AM HA.  If HA recurs  then viibryd. Starter kit.  Depakote  didn't help as much as expected and she might stop it.   02/18/2020 phone call: Yolanda Carroll called to report that she is experiencing high anxiety.  It is causing chest discomfort and breathing problems. Has appt 11/18 and is on the wait list but needs to discuss how to relief this anxiety.  Please call. Response: Note Rtc to patient, she does have the Viibryd and does remember that discussion. She has not been able to start that because she received a steroid injection a couple weeks ago and caused her stomach to be upset. She didn't want to start it knowing that's a side effect. She feels like the steroid injection may have worsened all her symptoms. She did try the trazodone but it was too much for her, even taking a 1/2 tablet. She does have some serzone  left and has been taking that until this improves. Also taking Xanax  as needed during day if needed. Hoping all of this will improve soon. Informed her I would update Dr. Geoffry with her information.        04/01/2020 appointment with the following noted: Out of nefazodone  a few weeks ago.  More reflux off it.  Tried trazodone a couple of times.  Tried 100 mg trazodone and heart skipped.  50 mg awoke with chest tightness.  Long haul Covid with GI sx.  Nausea and anxiety problems daily since mid September.  Covid early August and got monoclonal infusion which helped.  Headed to long haul Covid clinic in [place] Monday.   Needed to increase alprazolam  to BID.  And 80% better with that.  Panic some out of bed.  Sx started after steroid shot for her back 9/21.   Hasn't tried Viibryd DT fear of GI worsening.  Poor appetite and lost 15#. Not taken tramadol yet. Migraine under control. Plan: no med changes  06/01/20 appt with following noted: Starting generic Depakote  ER generic today. Started Mirtazapine  3.75 mg and it worked right away for nausea for a week.  Then increased to 7.5 mg HS.  Most days is feeling really well.   No SE except mild drowsiness. Still has post Covid problems with stomach but much better.  Regained 7# of needed weight.  Need to try to gain 10# more. Zofran  failed. Had a lot anxiety with N but it's better now.  Was waking with panic and no longer N and anxiety better too. Severe scoliosis facing big surgery in summer with long recovery.  Can't ride in car for long. Plan: No med changes  08/18/2020 appointment with the following noted: Gained 12# and thankful for mirtazapine . Long Covid GI sx.  Better not gone with mirtazapine . Rare AM panic.  Sleep  good. Scoliosis and may require surgery. Can't sit or stand for long Depakote  brand ER 250 helps heartburn. Generic failed. Patient denies difficulty with sleep initiation with Xanax .. Denies appetite disturbance.  Patient reports that energy and motivation have been good.  Patient denies any difficulty with concentration.  Patient denies any suicidal ideation. More depressed and tearful with pain but varies with pain levels.  Good and bad days.  Not too depressed with anxiety worse. Plan: Continue mirtazapine  7.5 mg with Xanax  for GI problems including nausea and appetite.  It has worked some.  Try higher dose. Insomnia managed with  With alprazolam .    11/18/2020 appointment with the following noted: Increased mirtazapine  to 7.5 mg 1 and 1/2 tablet bc  less anxious with it. Facing big back surgery for severe scoliosis 11/30/20.  Can't drive in car for distance or travel. Now on Medicare. Depakote  ER BRAND helped GI problems which were worse on generic. Not markedly depressed.  Patient reports stable mood and denies depressed or irritable moods.   Patient denies difficulty with sleep initiation or maintenance. Denies appetite disturbance.  Patient reports that energy and motivation have been good.  Patient denies any difficulty with concentration.  Patient denies any suicidal ideation. Plan: No med changes Continue Depakote  brand ER 250 mg daily,  continue alprazolam  0.25 mg nightly, continue mirtazapine  7.5 mg nightly  04/26/2021 appointment with the following noted: Still on hydrocodone  from surgery on back in July.  Driving.  Healing slowly. Pain before surgery is gone now.  Now pain related to adjusting to rod. But surgery was successful. Ubaldo has been helpful. Has reduced mirtazapine  3.75 mg HS to try to come off it.  Sleep a little worse if doesn't take it.  Stomach problems are better.   No HA in a year but started having migraines in a year.  Migraines started after change in logo of brand Depakote  so restarted Aimovig .  Depakote  also helped acid reflux. Mirtazapine  helped sleep and appetite.  Weight is good.  Sleep is good with it still. Mood is OK. Active at church and people are supportive. Continue Depakote  brand ER 250 mg daily, continue alprazolam  0.25 mg nightly, continue mirtazapine  7.5 mg nightly  08/03/2021 appointment the following noted: Not doing so good.  Was doing great after surgery. A few weeks ago the nausea and anxiety came back. Had reduced mirtazapine  to 3.75 mg HS, back up to 7.5 mg HS about 2 weeks.  No effects so far. Mad and depressed and not me.  Nausea coming back has been upsetting. Pursuing GI work up. Awakens with anxiety.   No sleep trouble. Conc worse. Plan:Was Much better with with anxiety, appetite and sleep on mirtazapine  7.5 mg tablet 1 nightly and relapsed when decreased to 1/tablet nightly,  so increase to 1 &  1/2 of the 7.5 mg tablets HS .   09/27/2021 appointment with the following noted: Last few weeks tough. Cholecystectomy 09/08/21 DT chronic nausea but not better.  Yesterday no nausea for first time in months. Losing weight on bland diet. But it stopped. Chronic back pain after surgery. SE mirtazapine  dry mouth. Usually feels ok in AM but afternoons gets nausea.   Xanax  does help afternoon nausea so maybe it's anxiety. Exhausted from all of this.  Still blames some of it on  covid. Not sleepy with Xanax .   Sad bc can't function normally.  Usually not depressed. Sleep is fine. Plan:Was Much better with with anxiety, appetite and sleep on mirtazapine  7.5  mg tablet 1 nightly initially.  Nausea, depression and anxiety Are back again. Option increase to 15 mg .  Yes.  10/26/2021 appointment with the following noted: It helped some.  Still some nausea.  CT scan next week.  Off and on nausea without pattern.  Not clearly anxiety but Xanax  prn does help.  Frustrating that it is not predictable. Tolerated in crease in mirtazapine  15 with mild hangover.  Still some anxiety first thing in AM but not as bad as it was and is worse without Xanax .  No reason for the anxiety.   Not depressed but discouraged.  Tries to stay active and involved. Sleep is great always with Xanax . Chronic opiates DT back surgery for scoliosis Plan: Continue Xanax  0.25 mg BID for panic and GAD , but option increase TID bc helps nausea.  11/10/21 TC: Pt called reporting CT scan results fine. Requesting as discussed to change Mirtazepine to Olanzapine . MD: As discussed at the last office visit, if her GI work-up did not give a solution to her abdominal problems then it would be reasonable to try olanzapine  at low dose.  I have sent in a prescription for 2.5 mg 1 nightly.  That is the lowest dose made.  If she uses a pill splitter she may try cutting it in half.  1/2 tablet may be sufficient for her.  She could experience benefit within the first day or 2 but give it a week or 2 as long as she is tolerating it to be sure. In order to minimize the risk of daytime sleepiness take the olanzapine  2 to 3 hours before bedtime.  12/15/21 TC: Patient said she had increased the olanzapine  and is still c/o headache and has had some dizziness today. She said she did okay on the mirtazapine . She said you had her on 1-1/2 tabs of 7.5. She said it probably isn't 100%, but felt she did okay otherwise.   01/11/22 appt  noted: No solution to GI px.  Was told to get off hydrocodone  but can't DT pain. Olanzapine  tried with SE dizziness and HA after a month.  Without much benefit over mirtazapine . Overall ok.  Still some anxiety which doesn't want to eat but doing ok. Mirtazapine  7.5 mg HS helps. Sleep good.   Started seing [employer], good. About the same with mood  with some depression over heallth problems. Sees surgeon in October about pain problems. No panic but awakens with anxiety and general worry.   Less nausea.  02/27/22 TC: Does not appear that she is receiving any benefit from mirtazapine .  Mentioned in my notes that the obvious antidepressant choice would be duloxetine  because it will sometimes help with pain as well as depression and anxiety. Stop mirtazapine  and start duloxetine  20 mg capsule 1 daily for 1 week then 2 capsules daily. It will take 2 to 4 weeks to get any benefit from the duloxetine .  I would suggest in the interim that she try something that is stronger to help with nausea and anxiety but is likely to be insufficiently helpful for depression, therefore the need for duloxetine . So in addition to the duloxetine  start olanzapine  at the lowest dose 2.5 mg tablet 1 nightly which could help with nausea appetite and anxiety almost right away.  If it causes too much sleepiness have her take it 2 to 3 hours before bedtime and she can even cut it in half if she has to. I will send in both prescriptions.  03/06/22 TC:  On 10/16 you wanted pt to take olanzapine  and Cymbalta  to replace mirtazapine .She did not start the Cymbalta  but did take olanzapine .She had side effects including insomnia,hand tremors,blurred vision and increased back pain.She decided to stop the med.She still wants to try Cymbalta  but wants to know if it's ok to take with mirtazapine  because she decided to start back taking it.     04/13/22 appointment noted:  Tried olanzapine  twice with hand tremor and blurred  vision. Back to mirtazapine  15 mg HS.   Less nausea.  Able to gain 6 # but no appetite. Bottomed out in October. Never tried Cymbalta .  Some concern over SE with it. Increased gabapentin  to help pain with a little benefit. 300 BID and tolerating. In pain all day.  Tried Nucynta without help.   Mood is better than it was.  Working to accept chronic pain and limited activithy.  04/20/23 TC  Pt said that the cymbalta  is making her jittery and more anxious. She has cut the mirtazapine  to 7.5mg  not sure if working . Wants something different     MD response:   She is taking the lowest dose of dulxetine and not tolerating it so stop it. I don't have antoher option that will help pain.   If she thinks she is getting some benefit with mirtazapine  then continue it.  If not stop it too I would also suggest she come get the [place] genetic test I discussed with her.      06/20/22 appt noted: Mirtazapine  helped regain some wt and nausea is better for 6-8 weeks. Currently on 7.5 mg mirtazapine  and was up to 15 mg daily. Still some anxiety issues and needs Xanax . Heart burn without Depakote .  It's ok right now.   Peripheral nn stimulator in her back temporary for 2 mos.  No benefit so far.  Chronic pain.   Still seeing Debbie. Not markedly depressed.   Plan: Cont Depakote  for reflux.  Depakote  ER BRAND helped GI problems which were worse on generic.  But she wants to retry stopping it. Rec continue mirtazapine  bc helpful  09/18/22 apt noted: Still taking Depakote  ER 250 mg HS.  Reduced mirtazapine  to 3.75 mg HS. No N and regained lost wt.  Still some anxiety managed with mirtazapine .  GI is fine now.  Vocal problems.   Chronic back pain.  Accepting of this.  Still exercise.  Finished therapy with Alm.  Asks about CBD. Plan: Continue Xanax  0.25 mg BID for panic and GAD , but option increase TID bc helps nausea. Depakote  ER BRAND helped GI problems which were worse on generic.  But she wants to  retry stopping it. Option continue or stop mirtazapine   02/14/2023 appointment noted: Meds: Alprazolam  0.25 mg 3 times daily as needed anxiety usu just 0.125 AM, Depakote  ER 250 daily, gabapentin  300 mg 3 times daily, mirtazapine  3.75 mg nightly.  Takig hydrocodone  and muscle relaxer. SE no cog px. Decided to continue meds for now. When tried to stop Depakote  had more acid reflux. No CBD bc got sleepy.  Still CBP.  GI ok.   Sleep great. Anxiety is controlled without panic except rarely. 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  08/15/23 appt noted: Med: mirtazapine  7.5 mg HS, Alprazolam  0.25 mg 3 times daily as needed anxiety usu just 0.125 AM, Depakote  ER 250 daily, gabapentin  300 mg 3 times daily.   Takig hydrocodone  and muscle relaxer. Clemens in Nov with secondary EMA with anxiety and  more pain. .   Norovirus feb messed up GI and caused 10# wt loss.  So increased mirtazapine  7.5 mg HS for a couple of weeks. With alprazolam  only taking 0.125 or 0.25 prn anxiety. Doc friend rec rial amitriptyline  for pain. More down bc chronic pain.  Done all the PT  and just hurts so bad.  Plan: DC mirtazapine   Trial amitriptyline  10 mg HS for pain  10/15/23 appt noted: Med: mirtazapine  7.5 mg HS, Alprazolam  0.25 mg 3 times daily as needed anxiety usu just 0.125 AM, Depakote  ER 250 daily, gabapentin  300 mg 3 times daily.    SE sweating and anxiety with amitriptyline .  Mirtazapine  helps more with anxiety and appetite and sleep. Alprazolam  not needed for sleep.   Morphine  NR and stopped.  Mood frustrated with chronic pain 8/10.  Got desperate yesterday and looking on internet to try to find something to help.  Mood is not great.   May switch gabapentin  to Lyrica per other doctor. Plan trial increase mirtazapine  for pain and mood.    12/17/23 appt noted:  Med: incr and reduced mirtazapine  7.5 mg HS, Alprazolam  0.25 mg 3 times daily as needed anxiety usu just 0.125 AM, Depakote  ER 250  daily, switch gabapentin  to Lyrica per other doctor 100 mg TID No SE with higher mirtazapine  but wanted to reduce.   Trying to get off hydrocodone .   Dep better than last time.   Chronic back pain ongoing.  3 years since surgery.  Don't know why it still hurts.  Tries to pace herself.  Lyrica helps some and accupuncture helps some.  Ubaldo has been helpful with her.  Better than she anticipated handling her back pain.  Marval has been helpful in therapy.    Saw neurologist for Migraine and had problems with the meds RX.  Aimovig  has helped..   Back surgeries Cervical 10 y ago.  Latest for scoliosis but didn't get pain relief.    Past Psychiatric Medication Trials:  Failed multiple other antidepressants,  Celexa chest pain Duloxetine  SE Nortriptyline hyper; amitriptyline  10 with SE sweating and NR Hydroxyzine  SE  nefazodone  & Depakote  for migraine,  Depakote  brand ER 250 helps heartburn. Generic failed. Gabapentin  300 TID Mirtazapine  7.5 mg with some benefit and also  Olanzapine  2.5 mg HS with SE dizziness and HA citalopram palpitations Trazodone Clonazepam did not help anxiety, alprazolam  helps anxiety and sleep  Review of Systems:  Review of Systems  Constitutional:  Positive for appetite change.  Gastrointestinal:  Negative for abdominal pain and nausea.  Musculoskeletal:  Positive for arthralgias and back pain.  Neurological:  Negative for tremors.  Psychiatric/Behavioral:  Negative for agitation, behavioral problems, confusion, decreased concentration, dysphoric mood, hallucinations, self-injury, sleep disturbance and suicidal ideas. The patient is nervous/anxious. The patient is not hyperactive.     Medications: I have reviewed the patient's current medications.  Current Outpatient Medications  Medication Sig Dispense Refill   AIMOVIG  140 MG/ML SOAJ INJECT 140 MG INTO SKIN EVERY 30 DAYS 3 mL 3   Ascorbic Acid (VITAMIN C PO) Take 500 mg by mouth daily.     Calcium  Carbonate (CALCIUM 600 PO) Take 600-1,200 mg by mouth daily.     cholecalciferol (VITAMIN D3) 25 MCG (1000 UNIT) tablet Take 1,000 Units by mouth daily.     denosumab  (PROLIA ) 60 MG/ML SOSY injection      estradiol  (ESTRACE ) 0.1 MG/GM vaginal cream Place 0.5 g vaginally 2 (two) times a week.     famotidine  (PEPCID )  40 MG tablet TAKE 1 TABLET BY MOUTH DAILY 30 tablet 3   HYDROcodone -acetaminophen  (NORCO) 7.5-325 MG tablet Take 1.5 tablets by mouth every 6 (six) hours as needed for moderate pain.     hyoscyamine  (LEVSIN  SL) 0.125 MG SL tablet Place 1 tablet (0.125 mg total) under the tongue every 6 (six) hours as needed. 30 tablet 0   IMVEXXY  MAINTENANCE PACK 4 MCG INST Place vaginally.     Lactobacillus (ACIDOPHILUS) 100 MG CAPS Take 100 mg by mouth daily.     linaclotide  (LINZESS ) 290 MCG CAPS capsule Take 1 capsule (290 mcg total) by mouth daily before breakfast. 30 capsule 2   methocarbamol  (ROBAXIN ) 750 MG tablet Take 750 mg by mouth 3 (three) times daily.     Multiple Vitamin (MULTIVITAMIN) tablet Take 1 tablet by mouth daily.     ondansetron  (ZOFRAN ) 4 MG tablet Take 1 tablet (4 mg total) by mouth 2 (two) times daily as needed for nausea or vomiting. 60 tablet 1   polyethylene glycol (MIRALAX / GLYCOLAX) 17 g packet Take 17 g by mouth daily.     pregabalin (LYRICA) 100 MG capsule Take 100 mg by mouth 3 (three) times daily.     PREVIDENT 5000 SENSITIVE 1.1-5 % GEL Place onto teeth.     Probiotic Product (RESTORA PO) Take by mouth.     Rimegepant Sulfate (NURTEC) 75 MG TBDP Take 1 tablet (75 mg total) by mouth daily as needed (take for abortive therapy of migraine, no more than 1 tablet in 24 hours or 10 per month). 8 tablet    ALPRAZolam  (XANAX ) 0.25 MG tablet Take 1 tablet (0.25 mg total) by mouth 3 (three) times daily as needed for anxiety. 270 tablet 1   DEPAKOTE  ER 250 MG 24 hr tablet Take 1 tablet (250 mg total) by mouth daily. 90 tablet 3   mirtazapine  (REMERON ) 7.5 MG tablet TAKE 1 AND  1/2 TABLETS BY MOUTH  AT NIGHT (DISCONTINUE  AMITRIPTYLINE ) 135 tablet 1   Current Facility-Administered Medications  Medication Dose Route Frequency Provider Last Rate Last Admin   [START ON 12/31/2023] denosumab  (PROLIA ) injection 60 mg  60 mg Subcutaneous Once Gherghe, Cristina, MD        Medication Side Effects: None  Allergies:  Allergies  Allergen Reactions   Penicillins Other (See Comments)    Seizures as a child   Atarax  [Hydroxyzine ] Other (See Comments)    Headache, depression   Bactrim  [Sulfamethoxazole -Trimethoprim ] Nausea Only   Celexa [Citalopram Hydrobromide] Other (See Comments)    Chest pain   Erythromycin      Upset stomach   Flagyl  [Metronidazole ]     Dizzy and increased heart rate   Nortriptyline Itching   Orphenadrine     Hand tremors   Prilosec [Omeprazole]     Abdominal pain   Requip [Ropinirole]     Made sx worse   Darvocet [Propoxyphene N-Acetaminophen ] Itching   Percocet [Oxycodone -Acetaminophen ] Itching    Past Medical History:  Diagnosis Date   Allergy    Anxiety    Arthritis    Cervical dysplasia    Diverticulitis    Endometriosis    Fatigue    Gallstones    GERD (gastroesophageal reflux disease)    History of hiatal hernia    pt states she currently has a hiatal hernia   HSV infection    oral   IBS (irritable bowel syndrome)    Migraine    Osteopenia    Osteoporosis 01/2019  T score -3.1   Osteoporosis    Plantar fasciitis    PONV (postoperative nausea and vomiting)    also difficult to wake up   Recurrent vaginitis    Reflux    Scoliosis    Wears glasses     Family History  Problem Relation Age of Onset   Cancer Father        lymphoma   Other Mother        bipolar,reflux   Bipolar disorder Mother    Other Brother        sinus problems   Cancer Maternal Aunt        uterine cancer   Breast cancer Maternal Aunt        40's   Diabetes Maternal Aunt    Cancer Paternal Aunt        Colon cancer   Breast cancer  Cousin 50       Mat. 1st cousin    Social History   Socioeconomic History   Marital status: Married    Spouse name: Ubaldo   Number of children: 0   Years of education: 16   Highest education level: Not on file  Occupational History    Comment: [employer]  Tobacco Use   Smoking status: Former    Current packs/day: 0.00    Types: Cigarettes    Quit date: 05/17/1983    Years since quitting: 40.6   Smokeless tobacco: Never  Vaping Use   Vaping status: Never Used  Substance and Sexual Activity   Alcohol use: Not Currently   Drug use: No   Sexual activity: Not Currently    Birth control/protection: Post-menopausal    Comment: intercourse age 1 , sexual partners more than 5  Other Topics Concern   Not on file  Social History Narrative   Pt is married, no children.  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    Caffeine - very little.   Right handed   Lives at home with her husband   Social Drivers of Corporate investment banker Strain: Not on file  Food Insecurity: Not on file  Transportation Needs: Not on file  Physical Activity: Not on file  Stress: Not on file  Social Connections: Not on file  Intimate Partner Violence: Not on file    Past Medical History, Surgical history, Social history, and Family history were reviewed and updated as appropriate.   Please see review of systems for further details on the patient's review from today.   Objective:   Physical Exam:  There were no vitals taken for this visit.  Physical Exam Neurological:     Mental Status: She is alert and oriented to person, place, and time.     Cranial Nerves: No dysarthria.  Psychiatric:        Attention and Perception: Attention and perception normal.        Mood and Affect: Mood is anxious. Mood is not depressed. Affect is not tearful.        Speech: Speech normal.        Behavior: Behavior is cooperative.        Thought Content: Thought content normal.  Thought content is not paranoid or delusional. Thought content does not include homicidal or suicidal ideation. Thought content does not include suicidal plan.        Cognition and Memory: Cognition and memory normal.        Judgment: Judgment normal.     Comments: Insight  intact Dep is some better than last time.        Lab Review:     Component Value Date/Time   NA 139 10/19/2023 1130   NA 134 03/26/2020 1056   K 4.3 10/19/2023 1130   CL 99 10/19/2023 1130   CO2 33 (Yolanda Carroll) 10/19/2023 1130   GLUCOSE 101 (Yolanda Carroll) 10/19/2023 1130   BUN 4 (L) 10/19/2023 1130   BUN 8 03/26/2020 1056   CREATININE 0.79 10/19/2023 1130   CALCIUM 9.5 10/19/2023 1130   PROT 7.5 10/19/2023 1130   ALBUMIN 4.8 10/19/2023 1130   AST 18 10/19/2023 1130   ALT 13 10/19/2023 1130   ALKPHOS 34 (L) 10/19/2023 1130   BILITOT 0.3 10/19/2023 1130   GFRNONAA >60 08/31/2021 1100   GFRAA 91 03/26/2020 1056       Component Value Date/Time   WBC 4.5 10/19/2023 1130   RBC 4.35 10/19/2023 1130   HGB 13.1 10/19/2023 1130   HCT 39.3 10/19/2023 1130   PLT 250.0 10/19/2023 1130   MCV 90.3 10/19/2023 1130   MCH 30.6 08/31/2021 1100   MCHC 33.3 10/19/2023 1130   RDW 13.0 10/19/2023 1130   LYMPHSABS 1.3 10/19/2023 1130   MONOABS 0.6 10/19/2023 1130   EOSABS 0.0 10/19/2023 1130   BASOSABS 0.0 10/19/2023 1130    Normal liver enzymes in September.  No results found for: POCLITH, LITHIUM   No results found for: PHENYTOIN, PHENOBARB, VALPROATE, CBMZ   .res Assessment: Plan:    Rue was seen today for follow-up.  Diagnoses and all orders for this visit:  Generalized anxiety disorder  Panic disorder without agoraphobia -     mirtazapine  (REMERON ) 7.5 MG tablet; TAKE 1 AND 1/2 TABLETS BY MOUTH  AT NIGHT (DISCONTINUE  AMITRIPTYLINE ) -     ALPRAZolam  (XANAX ) 0.25 MG tablet; Take 1 tablet (0.25 mg total) by mouth 3 (three) times daily as needed for anxiety.  Recurrent major depression in complete remission  (HCC) -     mirtazapine  (REMERON ) 7.5 MG tablet; TAKE 1 AND 1/2 TABLETS BY MOUTH  AT NIGHT (DISCONTINUE  AMITRIPTYLINE )  Insomnia due to mental condition -     mirtazapine  (REMERON ) 7.5 MG tablet; TAKE 1 AND 1/2 TABLETS BY MOUTH  AT NIGHT (DISCONTINUE  AMITRIPTYLINE )  Migraine without aura and without status migrainosus, not intractable  Gastroesophageal reflux disease without esophagitis -     DEPAKOTE  ER 250 MG 24 hr tablet; Take 1 tablet (250 mg total) by mouth daily.     30 min time with patient was spent on counseling and coordination of care. We discussed the following. Mood is better overall in the last months.   Was Much better with with anxiety, appetite and sleep on mirtazapine  3.75 mg tablet 1 nightly initially.    Depakote  ER BRAND helped GI problems which were worse on generic.  But she wants to retry stopping it.  Supportive therapy dealing with health problems.  Back pain ongoing.    Answered questions about CBD. Option low dose Fetzima 20 for pain.  We discussed the short-term risks associated with benzodiazepines including sedation and increased fall risk among others.  Discussed long-term side effect risk including dependence, potential withdrawal symptoms, and the potential eventual d  ose-related risk of dementia. Disc newer studies that cast in doubt the relationship with dementia.  Not drowsy with alprazolam . Disc warnings about combo of Bz and opiates not relevant at this low dose. BC morning anxiety could try switching to Valium and she doesn't  want to try it. Continue Xanax  0.25 mg BID for panic and GAD , but option increase TID bc helps nausea.  Consider Genesight testing bc med sensitivity and failures. ACTX partially reviewed but site is poorly designed and results difficult to see.  Disc this in detail available.  But increase risk SJS with CBZ and oxcarb  Answered questions about Lyrica and SE differences with gabapentin . Answered questions about  functional medicine.   Consider higher doses mirtazapine .  Disc SE.  Option increase again if needed.   Option increase mirtazapine  7.5 mg 1 and 1/2 tablets and HS, helps mood and appetite.  Alprazolam  0.25 mg 3 times daily as needed anxiety usu just 0.125 AM,  Depakote  ER 250 daily,  FU 3-4 mos  Lorene Macintosh, MD, DFAPA  Please see After Visit Summary for patient specific instructions.  Future Appointments  Date Time Provider Department Center  01/04/2024 10:15 AM [facility] NURSE [facility] None  01/23/2024 12:30 PM Stacia Glendia BRAVO, MD [facility]  10/29/2024 11:00 AM Cary No, NP [facility] None  11/19/2024 10:00 AM Trixie File, MD [facility] None    No orders of the defined types were placed in this encounter.     -------------------------------

## 2024-01-01 ENCOUNTER — Ambulatory Visit: Payer: Medicare Other

## 2024-01-04 ENCOUNTER — Ambulatory Visit (INDEPENDENT_AMBULATORY_CARE_PROVIDER_SITE_OTHER)

## 2024-01-04 DIAGNOSIS — M810 Age-related osteoporosis without current pathological fracture: Secondary | ICD-10-CM | POA: Diagnosis not present

## 2024-01-04 MED ORDER — DENOSUMAB 60 MG/ML ~~LOC~~ SOSY: 60.0000 mg | PREFILLED_SYRINGE | Freq: Once | SUBCUTANEOUS | Status: AC

## 2024-01-04 NOTE — Progress Notes (Signed)
 Medication: Prolia  Dosage:60 mg Location: Left Arm Injection given ab:[id number] Provider: Lela Trixie COME

## 2024-01-09 ENCOUNTER — Encounter: Admitting: Gastroenterology

## 2024-01-14 NOTE — Telephone Encounter (Signed)
 Last Prolia  inj 01/04/24 Next Prolia  inj due 07/07/24

## 2024-01-23 ENCOUNTER — Ambulatory Visit: Admitting: Gastroenterology

## 2024-01-23 ENCOUNTER — Encounter: Payer: Self-pay | Admitting: Gastroenterology

## 2024-01-23 VITALS — BP 126/75 | HR 62 | Temp 97.5°F | Resp 11 | Ht 68.0 in | Wt 128.0 lb

## 2024-01-23 DIAGNOSIS — K3189 Other diseases of stomach and duodenum: Secondary | ICD-10-CM | POA: Diagnosis not present

## 2024-01-23 DIAGNOSIS — Q439 Congenital malformation of intestine, unspecified: Secondary | ICD-10-CM

## 2024-01-23 DIAGNOSIS — D122 Benign neoplasm of ascending colon: Secondary | ICD-10-CM

## 2024-01-23 DIAGNOSIS — K5710 Diverticulosis of small intestine without perforation or abscess without bleeding: Secondary | ICD-10-CM

## 2024-01-23 DIAGNOSIS — K319 Disease of stomach and duodenum, unspecified: Secondary | ICD-10-CM | POA: Diagnosis not present

## 2024-01-23 DIAGNOSIS — K562 Volvulus: Secondary | ICD-10-CM | POA: Diagnosis not present

## 2024-01-23 DIAGNOSIS — R1032 Left lower quadrant pain: Secondary | ICD-10-CM

## 2024-01-23 DIAGNOSIS — K219 Gastro-esophageal reflux disease without esophagitis: Secondary | ICD-10-CM | POA: Diagnosis not present

## 2024-01-23 DIAGNOSIS — K2950 Unspecified chronic gastritis without bleeding: Secondary | ICD-10-CM

## 2024-01-23 DIAGNOSIS — R490 Dysphonia: Secondary | ICD-10-CM

## 2024-01-23 DIAGNOSIS — K582 Mixed irritable bowel syndrome: Secondary | ICD-10-CM

## 2024-01-23 DIAGNOSIS — Z1211 Encounter for screening for malignant neoplasm of colon: Secondary | ICD-10-CM

## 2024-01-23 DIAGNOSIS — J383 Other diseases of vocal cords: Secondary | ICD-10-CM

## 2024-01-23 MED ORDER — SODIUM CHLORIDE 0.9 % IV SOLN: 500.0000 mL | Freq: Once | INTRAVENOUS | Status: AC

## 2024-01-23 NOTE — Progress Notes (Signed)
 [facility] History and Physical   Primary Care Physician:  Verena Mems, MD   Reason for Procedure:   GERD, colon cancer screening  Plan:    EGD, colonoscopy     HPI: Yolanda Carroll is a 68 y.o. female undergoing average risk screening colonoscopy and EGD to evaluate persistent GERD symptoms.  She has had bothersome voice changes/hoarseness as well as excessive phlegm, but denies typical heartburn or acid regurgitation.  Currently on famotidine  for acid suppression She had a colonoscopy by Dr. Donnald in 2015 with sigmoid diverticulosis.  Polyps were removed but were lymphoid aggregates.  Her symptoms have been about the same since her visit in July, although she is now bothered more by constipation than diarrhea.     Past Medical History:  Diagnosis Date   Allergy    Anxiety    Arthritis    Blood transfusion without reported diagnosis    Cervical dysplasia    Diverticulitis    Endometriosis    Fatigue    Gallstones    GERD (gastroesophageal reflux disease)    History of hiatal hernia    pt states she currently has a hiatal hernia   HSV infection    oral   IBS (irritable bowel syndrome)    Migraine    Osteopenia    Osteoporosis 01/2019   T score -3.1   Osteoporosis    Plantar fasciitis    PONV (postoperative nausea and vomiting)    also difficult to wake up   Recurrent vaginitis    Reflux    Scoliosis    Wears glasses     Past Surgical History:  Procedure Laterality Date   ANTERIOR CERVICAL DECOMP/DISCECTOMY FUSION  04/17/2011   Procedure: ANTERIOR CERVICAL DECOMPRESSION/DISCECTOMY FUSION 2 LEVELS;  Surgeon: Lamar LELON Peaches;  Location: [facility];  Service: Neurosurgery;  Laterality: N/A;  Cervical five-six, six-seven anterior cervical decompression with fusion,  plating,  and bonegraft    APPENDECTOMY  1978   BACK SURGERY N/A    2022   BREAST BIOPSY  07/13/2011   Procedure: BREAST BIOPSY WITH NEEDLE LOCALIZATION;  Surgeon: Donnice Bury, MD;  Location: [facility];  Service: General;  Laterality: Right;  Right breast wire localization biopsy   BREAST EXCISIONAL BIOPSY Right 2013   BREAST SURGERY     Breast Bx-Benign   CATARACT EXTRACTION Bilateral 11/06/2022   CHOLECYSTECTOMY N/A 09/08/2021   Procedure: LAPAROSCOPIC CHOLECYSTECTOMY;  Surgeon: Vanderbilt Ned, MD;  Location: [facility];  Service: General;  Laterality: N/A;   CYSTOSCOPY     GYNECOLOGIC CRYOSURGERY     HERNIA REPAIR  08/02/1995   RIH   LAPAROSCOPIC ENDOMETRIOSIS FULGURATION  1997   PELVIC LAPAROSCOPY     ROTATOR CUFF REPAIR     right 2002 left 2000    Prior to Admission medications   Medication Sig Start Date End Date Taking? Authorizing Provider  ALPRAZolam  (XANAX ) 0.25 MG tablet Take 1 tablet (0.25 mg total) by mouth 3 (three) times daily as needed for anxiety. 12/17/23  Yes Cottle, Lorene KANDICE Raddle., MD  Ascorbic Acid (VITAMIN C PO) Take 500 mg by mouth daily.   Yes [provider]  Calcium Carbonate (CALCIUM 600 PO) Take 600-1,200 mg by mouth daily.   Yes [provider]  cholecalciferol (VITAMIN D3) 25 MCG (1000 UNIT) tablet Take 1,000 Units by mouth daily.   Yes [provider]  DEPAKOTE  ER 250 MG 24 hr tablet Take 1 tablet (250 mg total) by mouth  daily. 12/17/23  Yes Cottle, Lorene KANDICE Raddle., MD  estradiol  (ESTRACE ) 0.1 MG/GM vaginal cream Place 0.5 g vaginally 2 (two) times a week. 06/28/23  Yes [provider]  famotidine  (PEPCID ) 40 MG tablet TAKE 1 TABLET BY MOUTH DAILY 10/23/23  Yes Stacia Glendia BRAVO, MD  HYDROcodone -acetaminophen  (NORCO) 7.5-325 MG tablet Take 1.5 tablets by mouth every 6 (six) hours as needed for moderate pain.   Yes [provider]  Lactobacillus (ACIDOPHILUS) 100 MG CAPS Take 100 mg by mouth daily.   Yes [provider]  methocarbamol  (ROBAXIN ) 750 MG tablet Take 750 mg by mouth 3 (three) times daily. 07/23/21  Yes [provider]  mirtazapine  (REMERON ) 7.5  MG tablet TAKE 1 AND 1/2 TABLETS BY MOUTH  AT NIGHT (DISCONTINUE  AMITRIPTYLINE ) 12/17/23  Yes Cottle, Lorene KANDICE Raddle., MD  morphine  (MS CONTIN ) 15 MG 12 hr tablet SMARTSIG:1 Tablet(s) By Mouth Every 12 Hours 09/20/23  Yes [provider]  naloxone (NARCAN) nasal spray 4 mg/0.1 mL Place 0.1 mLs into the nose. 09/24/23  Yes [provider]  pregabalin (LYRICA) 100 MG capsule Take 100 mg by mouth 3 (three) times daily. 10/10/23  Yes [provider]  PREVIDENT 5000 SENSITIVE 1.1-5 % GEL Place onto teeth. 09/08/22  Yes [provider]  AIMOVIG  140 MG/ML SOAJ INJECT 140 MG INTO SKIN EVERY 30 DAYS 10/20/20   Lomax, Amy, NP  denosumab  (PROLIA ) 60 MG/ML SOSY injection  04/29/19   [provider]  hyoscyamine  (LEVSIN  SL) 0.125 MG SL tablet Place 1 tablet (0.125 mg total) under the tongue every 6 (six) hours as needed. 11/27/23   [place], PA-C  IMVEXXY  MAINTENANCE PACK 4 MCG INST Place vaginally. 09/20/23   [provider]  linaclotide  (LINZESS ) 290 MCG CAPS capsule Take 1 capsule (290 mcg total) by mouth daily before breakfast. 12/07/23   [place], PA-C  Multiple Vitamin (MULTIVITAMIN) tablet Take 1 tablet by mouth daily.    [provider]  ondansetron  (ZOFRAN ) 4 MG tablet Take 1 tablet (4 mg total) by mouth 2 (two) times daily as needed for nausea or vomiting. 10/19/23   [place], PA-C  polyethylene glycol (MIRALAX / GLYCOLAX) 17 g packet Take 17 g by mouth daily.    [provider]  Probiotic Product (RESTORA PO) Take by mouth.    [provider]  Rimegepant Sulfate (NURTEC) 75 MG TBDP Take 1 tablet (75 mg total) by mouth daily as needed (take for abortive therapy of migraine, no more than 1 tablet in 24 hours or 10 per month). 11/22/23   Lomax, Amy, NP    Current Outpatient Medications  Medication Sig Dispense Refill   ALPRAZolam  (XANAX ) 0.25 MG tablet Take 1 tablet (0.25 mg total) by mouth 3 (three) times daily as needed for  anxiety. 270 tablet 1   Ascorbic Acid (VITAMIN C PO) Take 500 mg by mouth daily.     Calcium Carbonate (CALCIUM 600 PO) Take 600-1,200 mg by mouth daily.     cholecalciferol (VITAMIN D3) 25 MCG (1000 UNIT) tablet Take 1,000 Units by mouth daily.     DEPAKOTE  ER 250 MG 24 hr tablet Take 1 tablet (250 mg total) by mouth daily. 90 tablet 3   estradiol  (ESTRACE ) 0.1 MG/GM vaginal cream Place 0.5 g vaginally 2 (two) times a week.     famotidine  (PEPCID ) 40 MG tablet TAKE 1 TABLET BY MOUTH DAILY 30 tablet 3   HYDROcodone -acetaminophen  (NORCO) 7.5-325 MG tablet Take 1.5 tablets by  mouth every 6 (six) hours as needed for moderate pain.     Lactobacillus (ACIDOPHILUS) 100 MG CAPS Take 100 mg by mouth daily.     methocarbamol  (ROBAXIN ) 750 MG tablet Take 750 mg by mouth 3 (three) times daily.     mirtazapine  (REMERON ) 7.5 MG tablet TAKE 1 AND 1/2 TABLETS BY MOUTH  AT NIGHT (DISCONTINUE  AMITRIPTYLINE ) 135 tablet 1   morphine  (MS CONTIN ) 15 MG 12 hr tablet SMARTSIG:1 Tablet(s) By Mouth Every 12 Hours     naloxone (NARCAN) nasal spray 4 mg/0.1 mL Place 0.1 mLs into the nose.     pregabalin (LYRICA) 100 MG capsule Take 100 mg by mouth 3 (three) times daily.     PREVIDENT 5000 SENSITIVE 1.1-5 % GEL Place onto teeth.     AIMOVIG  140 MG/ML SOAJ INJECT 140 MG INTO SKIN EVERY 30 DAYS 3 mL 3   denosumab  (PROLIA ) 60 MG/ML SOSY injection      hyoscyamine  (LEVSIN  SL) 0.125 MG SL tablet Place 1 tablet (0.125 mg total) under the tongue every 6 (six) hours as needed. 30 tablet 0   IMVEXXY  MAINTENANCE PACK 4 MCG INST Place vaginally.     linaclotide  (LINZESS ) 290 MCG CAPS capsule Take 1 capsule (290 mcg total) by mouth daily before breakfast. 30 capsule 2   Multiple Vitamin (MULTIVITAMIN) tablet Take 1 tablet by mouth daily.     ondansetron  (ZOFRAN ) 4 MG tablet Take 1 tablet (4 mg total) by mouth 2 (two) times daily as needed for nausea or vomiting. 60 tablet 1   polyethylene glycol (MIRALAX / GLYCOLAX) 17 g packet Take  17 g by mouth daily.     Probiotic Product (RESTORA PO) Take by mouth.     Rimegepant Sulfate (NURTEC) 75 MG TBDP Take 1 tablet (75 mg total) by mouth daily as needed (take for abortive therapy of migraine, no more than 1 tablet in 24 hours or 10 per month). 8 tablet    Current Facility-Administered Medications  Medication Dose Route Frequency Provider Last Rate Last Admin   0.9 %  sodium chloride  infusion  500 mL Intravenous Once Deantae Shackleton E, MD       [START ON 07/06/2024] denosumab  (PROLIA ) injection 60 mg  60 mg Subcutaneous Once Gherghe, Cristina, MD        Allergies as of 01/23/2024 - Review Complete 01/23/2024  Allergen Reaction Noted   Atarax  [hydroxyzine ] Other (See Comments) 01/08/2017   Celexa [citalopram hydrobromide] Other (See Comments) 02/21/2012   Flagyl  [metronidazole ] Other (See Comments) 10/22/2017   Nortriptyline Itching 04/17/2011   Orphenadrine Other (See Comments) 08/25/2021   Penicillins Other (See Comments) 11/30/2010   Requip [ropinirole] Other (See Comments) 01/05/2017   Prilosec [omeprazole] Other (See Comments) 01/05/2017   Darvocet [propoxyphene n-acetaminophen ] Itching 11/30/2010   Erythromycin Other (See Comments) 01/05/2017   Percocet [oxycodone -acetaminophen ] Itching 11/30/2010   Sulfamethoxazole -trimethoprim  Nausea Only and Other (See Comments) 08/25/2021    Family History  Problem Relation Age of Onset   Cancer Father        lymphoma   Other Mother        bipolar,reflux   Bipolar disorder Mother    Other Brother        sinus problems   Cancer Maternal Aunt        uterine cancer   Breast cancer Maternal Aunt        40's   Diabetes Maternal Aunt    Cancer Paternal Aunt        Colon  cancer   Breast cancer Cousin 50       Mat. 1st cousin    Social History   Socioeconomic History   Marital status: Married    Spouse name: Ubaldo   Number of children: 0   Years of education: 16   Highest education level: Not on file  Occupational  History    Comment: [employer]  Tobacco Use   Smoking status: Former    Current packs/day: 0.00    Types: Cigarettes    Quit date: 05/17/1983    Years since quitting: 40.7   Smokeless tobacco: Never  Vaping Use   Vaping status: Never Used  Substance and Sexual Activity   Alcohol use: Not Currently   Drug use: No   Sexual activity: Not Currently    Birth control/protection: Post-menopausal    Comment: intercourse age 78 , sexual partners more than 5  Other Topics Concern   Not on file  Social History Narrative   Pt is married, no children.  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    Caffeine - very little.   Right handed   Lives at home with her husband   Social Drivers of Corporate investment banker Strain: Not on file  Food Insecurity: Not on file  Transportation Needs: Not on file  Physical Activity: Not on file  Stress: Not on file  Social Connections: Not on file  Intimate Partner Violence: Not on file    Review of Systems:  All other review of systems negative except as mentioned in the HPI.  Physical Exam: Vital signs BP 112/68 (BP Location: Right Arm, Patient Position: Sitting, Cuff Size: Normal)   Pulse (!) 58   Temp (!) 97.5 F (36.4 C) (Temporal)   Ht 5' 8 (1.727 m)   Wt 128 lb (58.1 kg)   SpO2 100%   BMI 19.46 kg/m   General:   Alert,  Well-developed, well-nourished, pleasant and cooperative in NAD Airway:  Mallampati 2 Lungs:  Clear throughout to auscultation.   Heart:  Regular rate and rhythm; no murmurs, clicks, rubs,  or gallops. Abdomen:  Soft, nontender and nondistended. Normal bowel sounds.   Neuro/Psych:  Normal mood and affect. A and O x 3   Makaya Juneau E. Stacia, MD [facility]

## 2024-01-23 NOTE — Op Note (Signed)
 [facility] Patient Name: Yolanda Carroll Procedure Date: 01/23/2024 1:04 PM MRN: [id number] Endoscopist: Glendia E. Stacia , MD, [id number] Age: 68 Referring MD:  Date of Birth: [birth year] Gender: Female Account #: [id number] Procedure:                Colonoscopy Indications:              Screening for colorectal malignant neoplasm (last                            colonoscopy was 10 years ago) Medicines:                Monitored Anesthesia Care Procedure:                Pre-Anesthesia Assessment:                           - Prior to the procedure, a History and Physical                            was performed, and patient medications and                            allergies were reviewed. The patient's tolerance of                            previous anesthesia was also reviewed. The risks                            and benefits of the procedure and the sedation                            options and risks were discussed with the patient.                            All questions were answered, and informed consent                            was obtained. Prior Anticoagulants: The patient has                            taken no anticoagulant or antiplatelet agents. ASA                            Grade Assessment: II - A patient with mild systemic                            disease. After reviewing the risks and benefits,                            the patient was deemed in satisfactory condition to                            undergo the procedure.  After obtaining informed consent, the colonoscope                            was passed under direct vision. Throughout the                            procedure, the patient's blood pressure, pulse, and                            oxygen saturations were monitored continuously. The                            Olympus Scope SN: [id number] was introduced through                            the anus and advanced  to the the terminal ileum,                            with identification of the appendiceal orifice and                            IC valve. The colonoscopy was somewhat difficult                            due to significant looping and a tortuous colon.                            Successful completion of the procedure was aided by                            using manual pressure. The patient tolerated the                            procedure well. The quality of the bowel                            preparation was good. The terminal ileum, ileocecal                            valve, appendiceal orifice, and rectum were                            photographed. The bowel preparation used was SUPREP                            via split dose instruction. Scope In: 1:48:22 PM Scope Out: 2:12:18 PM Scope Withdrawal Time: 0 hours 12 minutes 37 seconds  Total Procedure Duration: 0 hours 23 minutes 56 seconds  Findings:                 The perianal and digital rectal examinations were                            normal. Pertinent negatives include normal  sphincter tone and no palpable rectal lesions.                           A 2 mm polyp was found in the ascending colon. The                            polyp was sessile. The polyp was removed with a                            cold snare. Resection and retrieval were complete.                            Estimated blood loss was minimal.                           The exam was otherwise normal throughout the                            examined colon.                           The terminal ileum appeared normal.                           The retroflexed view of the distal rectum and anal                            verge was normal and showed no anal or rectal                            abnormalities. Complications:            No immediate complications. Estimated Blood Loss:     Estimated blood loss was  minimal. Impression:               - One 2 mm polyp in the ascending colon, removed                            with a cold snare. Resected and retrieved.                           - The examined portion of the ileum was normal.                           - The distal rectum and anal verge are normal on                            retroflexion view. Recommendation:           - Patient has a contact number available for                            emergencies. The signs and symptoms of potential  delayed complications were discussed with the                            patient. Return to normal activities tomorrow.                            Written discharge instructions were provided to the                            patient.                           - Resume previous diet.                           - Continue present medications.                           - Await pathology results.                           - Repeat colonoscopy (date not yet determined) for                            surveillance based on pathology results. Maral Lampe E. Stacia, MD 01/23/2024 2:30:27 PM This report has been signed electronically.

## 2024-01-23 NOTE — Op Note (Signed)
 [facility] Patient Name: Yolanda Carroll Procedure Date: 01/23/2024 1:05 PM MRN: [id number] Endoscopist: Glendia E. Stacia , MD, [id number] Age: 68 Referring MD:  Date of Birth: 11/20/1955 Gender: Female Account #: [id number] Procedure:                Upper GI endoscopy Indications:              Suspected esophageal reflux, Laryngitis Medicines:                Monitored Anesthesia Care Procedure:                Pre-Anesthesia Assessment:                           - Prior to the procedure, a History and Physical                            was performed, and patient medications and                            allergies were reviewed. The patient's tolerance of                            previous anesthesia was also reviewed. The risks                            and benefits of the procedure and the sedation                            options and risks were discussed with the patient.                            All questions were answered, and informed consent                            was obtained. Prior Anticoagulants: The patient has                            taken no anticoagulant or antiplatelet agents. ASA                            Grade Assessment: II - A patient with mild systemic                            disease. After reviewing the risks and benefits,                            the patient was deemed in satisfactory condition to                            undergo the procedure.                           After obtaining informed consent, the endoscope was  passed under direct vision. Throughout the                            procedure, the patient's blood pressure, pulse, and                            oxygen saturations were monitored continuously. The                            GIF [id number] #[id number] was introduced through the                            mouth, and advanced to the second part of duodenum.                            The  upper GI endoscopy was accomplished without                            difficulty. The patient tolerated the procedure                            well. Scope In: Scope Out: Findings:                 The examined portions of the nasopharynx,                            oropharynx and larynx were normal, although there                            was a questionable sinus/defect associated with the                            right vocal cord.                           The examined esophagus was normal.                           Localized mucosal changes characterized by                            friability (with contact bleeding) were found in                            the gastric body. Scant hematin was found in the                            stomach. Biopsies were taken with a cold forceps                            for histology. Estimated blood loss was minimal.                           The exam of the stomach was otherwise normal.  Biopsies were taken with a cold forceps in the                            gastric antrum for Helicobacter pylori testing.                            Estimated blood loss was minimal.                           A 10 mm diverticulum was found in the third portion                            of the duodenum.                           The exam of the duodenum was otherwise normal. Complications:            No immediate complications. Estimated Blood Loss:     Estimated blood loss was minimal. Impression:               - The examined portions of the nasopharynx,                            oropharynx and larynx were normal. Query presence                            of right vocal cord abnormality.                           - Normal esophagus. No evidence of acid reflux. No                            hiatal hernia. Hill Grade 1 GEJ.                           - Friable (with contact bleeding) mucosa in the                            gastric  body. Biopsied.                           - Duodenal diverticulum.                           - Biopsies were taken with a cold forceps for                            Helicobacter pylori testing. Recommendation:           - Patient has a contact number available for                            emergencies. The signs and symptoms of potential                            delayed complications were discussed  with the                            patient. Return to normal activities tomorrow.                            Written discharge instructions were provided to the                            patient.                           - Resume previous diet.                           - Continue present medications.                           - Await pathology results.                           - Recommend ENT referral for further evaluation of                            hoarsness and possible vocal cord abnormality. Ernestine Langworthy E. Stacia, MD 01/23/2024 2:27:06 PM This report has been signed electronically.

## 2024-01-23 NOTE — Patient Instructions (Addendum)
 YOU HAD AN ENDOSCOPIC PROCEDURE TODAY AT THE [facility]:   Refer to the procedure report that was given to you for any specific questions about what was found during the examination.  If the procedure report does not answer your questions, please call your gastroenterologist to clarify.  If you requested that your care partner not be given the details of your procedure findings, then the procedure report has been included in a sealed envelope for you to review at your convenience later.  YOU SHOULD EXPECT: Some feelings of bloating in the abdomen. Passage of more gas than usual.  Walking can help get rid of the air that was put into your GI tract during the procedure and reduce the bloating. If you had a lower endoscopy (such as a colonoscopy or flexible sigmoidoscopy) you may notice spotting of blood in your stool or on the toilet paper. If you underwent a bowel prep for your procedure, you may not have a normal bowel movement for a few days.  Please Note:  You might notice some irritation and congestion in your nose or some drainage.  This is from the oxygen used during your procedure.  There is no need for concern and it should clear up in a day or so.  SYMPTOMS TO REPORT IMMEDIATELY:  Following lower endoscopy (colonoscopy or flexible sigmoidoscopy):  Excessive amounts of blood in the stool  Significant tenderness or worsening of abdominal pains  Swelling of the abdomen that is new, acute  Fever of 100F or higher  Following upper endoscopy (EGD)  Vomiting of blood or coffee ground material  New chest pain or pain under the shoulder blades  Painful or persistently difficult swallowing  New shortness of breath  Fever of 100F or higher  Black, tarry-looking stools  For urgent or emergent issues, a gastroenterologist can be reached at any hour by calling [phone]. Do not use MyChart messaging for urgent concerns.    DIET:  We do recommend a small meal at first, but  then you may proceed to your regular diet.  Drink plenty of fluids but you should avoid alcoholic beverages for 24 hours.  MEDICATIONS: Continue present medications.  FOLLOW UP: Await pathology results. Recommend ENT referral for further evaluation of hoarseness and possible vocal cord abnormality. Repeat colonoscopy (date not yet determined) for surveillance based on pathology results.  Handouts given to patient: Polyps.  Thank you for allowing us  to provide for your healthcare needs today.  ACTIVITY:  You should plan to take it easy for the rest of today and you should NOT DRIVE or use heavy machinery until tomorrow (because of the sedation medicines used during the test).    FOLLOW UP: Our staff will call the number listed on your records the next business day following your procedure.  We will call around 7:15- 8:00 am to check on you and address any questions or concerns that you may have regarding the information given to you following your procedure. If we do not reach you, we will leave a message.     If any biopsies were taken you will be contacted by phone or by letter within the next 1-3 weeks.  Please call us  at [phone] if you have not heard about the biopsies in 3 weeks.    SIGNATURES/CONFIDENTIALITY: You and/or your care partner have signed paperwork which will be entered into your electronic medical record.  These signatures attest to the fact that that the information above on your After  Visit Summary has been reviewed and is understood.  Full responsibility of the confidentiality of this discharge information lies with you and/or your care-partner.

## 2024-01-23 NOTE — Progress Notes (Signed)
 Sedate, gd SR, tolerated procedure well, VSS, report to RN

## 2024-01-23 NOTE — Progress Notes (Signed)
Vitals-SS  Pt's states no medical or surgical changes since previsit or office visit.  

## 2024-01-23 NOTE — Progress Notes (Signed)
 Called to room to assist during endoscopic procedure.  Patient ID and intended procedure confirmed with present staff. Received instructions for my participation in the procedure from the performing physician.

## 2024-01-24 ENCOUNTER — Telehealth: Payer: Self-pay | Admitting: *Deleted

## 2024-01-24 ENCOUNTER — Telehealth: Payer: Self-pay

## 2024-01-24 DIAGNOSIS — R490 Dysphonia: Secondary | ICD-10-CM

## 2024-01-24 NOTE — Telephone Encounter (Signed)
-----   Message from Glendia FORBES Holt sent at 01/23/2024  3:55 PM EDT ----- Regarding: ENT referral Alan,  Please place ENT consult for this patient.  She has previously seen Dr. Alm Bouche. Diagnosis: hoarseness, abnormal appearing vocal cord on EGD  Thanks

## 2024-01-24 NOTE — Telephone Encounter (Signed)
 Faxed referral to [facility].

## 2024-01-24 NOTE — Telephone Encounter (Signed)
  Follow up Call-     01/23/2024   12:55 PM 01/23/2024   12:46 PM  Call back number  Post procedure Call Back phone  # [phone]   Permission to leave phone message  Yes     Patient questions:  Do you have a fever, pain , or abdominal swelling? No. Pain Score  0 *  Have you tolerated food without any problems? Yes.    Have you been able to return to your normal activities? Yes.    Do you have any questions about your discharge instructions: Diet   No. Medications  No. Follow up visit  No.  Do you have questions or concerns about your Care? No.  Actions: * If pain score is 4 or above: No action needed, pain <4.

## 2024-01-28 ENCOUNTER — Encounter: Payer: Self-pay | Admitting: Gastroenterology

## 2024-01-28 LAB — SURGICAL PATHOLOGY

## 2024-01-30 ENCOUNTER — Ambulatory Visit: Payer: Self-pay | Admitting: Gastroenterology

## 2024-01-30 NOTE — Progress Notes (Signed)
 Ms. Yolanda Carroll,  The biopsies taken from your stomach were notable for mild chronic gastritis (inflammation) and reactive gastropathy which are common findings, but there was no evidence of Helicobacter pylori infection. There is no specific treatment or further evaluation recommended for this finding other than limit/avoid NSAIDs.  The polyp removed from your colon was not precancerous.  A repeat colonoscopy would be recommended in 10 years. Because colon cancer screening after age 38 is done on a case-by-case basis, taking into account the patient's risk factors for colon cancer, as well as comorbidities and life expectancy, I would recommend you make an appointment with me in 10 years to discuss the risks/benefits of further colon cancer screening if desired.

## 2024-01-31 NOTE — Telephone Encounter (Signed)
 Called [facility] to check on referral status. Patient scheduled to see Dr. Spainhour today, 01/31/24 at 1:30 pm. Called patient to inform her of appt. Patient states she cannot make this appt today. Provided patient with th phone number to reschedule appt.

## 2024-02-04 ENCOUNTER — Other Ambulatory Visit: Payer: Self-pay | Admitting: Gastroenterology

## 2024-03-24 ENCOUNTER — Ambulatory Visit: Payer: Medicare Other | Admitting: Internal Medicine

## 2024-03-24 ENCOUNTER — Other Ambulatory Visit: Payer: Self-pay | Admitting: Gynecology

## 2024-03-24 DIAGNOSIS — Z1231 Encounter for screening mammogram for malignant neoplasm of breast: Secondary | ICD-10-CM

## 2024-04-14 ENCOUNTER — Encounter: Payer: Self-pay | Admitting: Psychiatry

## 2024-04-14 ENCOUNTER — Ambulatory Visit: Admitting: Psychiatry

## 2024-04-14 DIAGNOSIS — F410 Panic disorder [episodic paroxysmal anxiety] without agoraphobia: Secondary | ICD-10-CM | POA: Diagnosis not present

## 2024-04-14 DIAGNOSIS — F3342 Major depressive disorder, recurrent, in full remission: Secondary | ICD-10-CM | POA: Diagnosis not present

## 2024-04-14 DIAGNOSIS — G43009 Migraine without aura, not intractable, without status migrainosus: Secondary | ICD-10-CM | POA: Diagnosis not present

## 2024-04-14 DIAGNOSIS — F5105 Insomnia due to other mental disorder: Secondary | ICD-10-CM

## 2024-04-14 DIAGNOSIS — K219 Gastro-esophageal reflux disease without esophagitis: Secondary | ICD-10-CM

## 2024-04-14 DIAGNOSIS — F411 Generalized anxiety disorder: Secondary | ICD-10-CM

## 2024-04-14 NOTE — Progress Notes (Signed)
 Yolanda Carroll [id number] [birth year] 68 y.o.   Subjective:   Patient ID:  Yolanda Carroll is a 68 y.o. (DOB 1956/03/27) female.  Chief Complaint:  Chief Complaint  Patient presents with   Follow-up    ANTHONELLA KLAUSNER presents to the office today for follow-up of anxiety and migraine.  seen in July 2020.  No meds were changed.  She requires name brand Depakote  because the generic caused worsening headaches.  Retired November 14, 2018.  Was so burned out at work.  Helping with church projects and helping friends.  Focusing on her health and doing PT for back and hip.  No kids or gkids.  Helps care for 89 yo M-in-law.  H is also busy which helps.  Given a surprise retirement party.     seen January 2021.  Nefazodone  is no longer manufactured and she had to wean off.  We also discussed the potential of weaning off Depakote  because Aimovig  had been very effective at managing her migraine headache.  She called August 11, 2019 stating that she did taper off of Depakote  because she did not feel like she needed it any more.  Her last dose was July 23, 2019 but afterwards noticed heartburn and swallowing issues.  Her GI doctor added Pepcid  40 mg daily.  Her neurologist had indicated that Depakote  can sometimes help with esophageal issues and she wondered if that was connected.  She was given the option to restart a lower dose and perhaps taper more slowly.  Off Depakote  for a month.  She elected not to restart it.  08/19/2019 appointment the following is noted: Tinnitus and mild anxiety worse off the Depakote  and with some mind racing esp in the AM.   Most is better. Reflux is worse off Depakote  ER 250 and wondering if she should restart it.   Disc article on Depakote  helping GERD by increasing lower esophageal sphincter tone. Has travelled some with family events.  M-in-law 92 soon.   HA good with Aimovig . Don't do season changes well and more HA in Spring often. Sleep not great lately with leg  cramps.  Started more exercise.  Started snoring and H is a light sleeper.  Meds help. Plan no med changes.  10/03/2019 phone call with patient asking to restart residual nefazodone  that she has on hand.  She felt that it helped back pain and she is experiencing more back pain recently.  She is aware that it is no longer manufactured to our knowledge and once she runs out it will not be available.  She indicated she had an off to take half of 150 mg nefazodone  tablet for about 2 to 3 months and would like to do so.  11/05/2019 appointment with the following noted:  Appt moved earlier DT the following. Had stopped Depakote  and nefazadone and reflux got worse.  Restarting Depakote  didn't help much.  Restarted nefazodaone and reflux better right away.  May not need Depakote  but doesn't want to change.  Can't exercise DT back pain since Jan ans worse since March.  Getting new doctor.  More depressed and anxious and sleep problems DT back pain.   Usually only 0.25 mg alprazolam  at night. Plan: has to stop nefazodone  when it runs out bc no longer manufactured.  Therefore will try trazodone in it's place.  01/07/20 appt with the following noted: She and H both got Covid.  Was pretty sick for 10 days.  Lost taste.  Had little resp stuff but severe  diarrhea and nausea and couldn't keep fluids down.  No residual sx. H had resp sx with pneumonia and had to get O2.  Hosp for 3 days. Still have some nefazodone  and nursing it along. Tried trazodone 50 HS for 2 nights and didn't sleep well and had a HA  Nefazodone  and Depakote  both helped the GERD.  She has enough nefazadone for about 6 weeks. Plan: Nefazodone  helped the reflux and she wants to take it as long as possible.  She didn't have withdrawal off nefazodone . When she runs out then start trazodone.  If that fails then use Viibryd.  Disc SE and alternatives.   Rec try trazodone again but use 100 mg and see if sleeping better prevents the AM HA.  If HA recurs  then viibryd. Starter kit.  Depakote  didn't help as much as expected and she might stop it.   02/18/2020 phone call: Brett called to report that she is experiencing high anxiety.  It is causing chest discomfort and breathing problems. Has appt 11/18 and is on the wait list but needs to discuss how to relief this anxiety.  Please call. Response: Note Rtc to patient, she does have the Viibryd and does remember that discussion. She has not been able to start that because she received a steroid injection a couple weeks ago and caused her stomach to be upset. She didn't want to start it knowing that's a side effect. She feels like the steroid injection may have worsened all her symptoms. She did try the trazodone but it was too much for her, even taking a 1/2 tablet. She does have some serzone  left and has been taking that until this improves. Also taking Xanax  as needed during day if needed. Hoping all of this will improve soon. Informed her I would update Dr. Geoffry with her information.        04/01/2020 appointment with the following noted: Out of nefazodone  a few weeks ago.  More reflux off it.  Tried trazodone a couple of times.  Tried 100 mg trazodone and heart skipped.  50 mg awoke with chest tightness.  Long haul Covid with GI sx.  Nausea and anxiety problems daily since mid September.  Covid early August and got monoclonal infusion which helped.  Headed to long haul Covid clinic in [place] Monday.   Needed to increase alprazolam  to BID.  And 80% better with that.  Panic some out of bed.  Sx started after steroid shot for her back 9/21.   Hasn't tried Viibryd DT fear of GI worsening.  Poor appetite and lost 15#. Not taken tramadol yet. Migraine under control. Plan: no med changes  06/01/20 appt with following noted: Starting generic Depakote  ER generic today. Started Mirtazapine  3.75 mg and it worked right away for nausea for a week.  Then increased to 7.5 mg HS.  Most days is feeling really well.   No SE except mild drowsiness. Still has post Covid problems with stomach but much better.  Regained 7# of needed weight.  Need to try to gain 10# more. Zofran  failed. Had a lot anxiety with N but it's better now.  Was waking with panic and no longer N and anxiety better too. Severe scoliosis facing big surgery in summer with long recovery.  Can't ride in car for long. Plan: No med changes  08/18/2020 appointment with the following noted: Gained 12# and thankful for mirtazapine . Long Covid GI sx.  Better not gone with mirtazapine . Rare AM panic.  Sleep  good. Scoliosis and may require surgery. Can't sit or stand for long Depakote  brand ER 250 helps heartburn. Generic failed. Patient denies difficulty with sleep initiation with Xanax .. Denies appetite disturbance.  Patient reports that energy and motivation have been good.  Patient denies any difficulty with concentration.  Patient denies any suicidal ideation. More depressed and tearful with pain but varies with pain levels.  Good and bad days.  Not too depressed with anxiety worse. Plan: Continue mirtazapine  7.5 mg with Xanax  for GI problems including nausea and appetite.  It has worked some.  Try higher dose. Insomnia managed with  With alprazolam .    11/18/2020 appointment with the following noted: Increased mirtazapine  to 7.5 mg 1 and 1/2 tablet bc  less anxious with it. Facing big back surgery for severe scoliosis 11/30/20.  Can't drive in car for distance or travel. Now on Medicare. Depakote  ER BRAND helped GI problems which were worse on generic. Not markedly depressed.  Patient reports stable mood and denies depressed or irritable moods.   Patient denies difficulty with sleep initiation or maintenance. Denies appetite disturbance.  Patient reports that energy and motivation have been good.  Patient denies any difficulty with concentration.  Patient denies any suicidal ideation. Plan: No med changes Continue Depakote  brand ER 250 mg daily,  continue alprazolam  0.25 mg nightly, continue mirtazapine  7.5 mg nightly  04/26/2021 appointment with the following noted: Still on hydrocodone  from surgery on back in July.  Driving.  Healing slowly. Pain before surgery is gone now.  Now pain related to adjusting to rod. But surgery was successful. Ubaldo has been helpful. Has reduced mirtazapine  3.75 mg HS to try to come off it.  Sleep a little worse if doesn't take it.  Stomach problems are better.   No HA in a year but started having migraines in a year.  Migraines started after change in logo of brand Depakote  so restarted Aimovig .  Depakote  also helped acid reflux. Mirtazapine  helped sleep and appetite.  Weight is good.  Sleep is good with it still. Mood is OK. Active at church and people are supportive. Continue Depakote  brand ER 250 mg daily, continue alprazolam  0.25 mg nightly, continue mirtazapine  7.5 mg nightly  08/03/2021 appointment the following noted: Not doing so good.  Was doing great after surgery. A few weeks ago the nausea and anxiety came back. Had reduced mirtazapine  to 3.75 mg HS, back up to 7.5 mg HS about 2 weeks.  No effects so far. Mad and depressed and not me.  Nausea coming back has been upsetting. Pursuing GI work up. Awakens with anxiety.   No sleep trouble. Conc worse. Plan:Was Much better with with anxiety, appetite and sleep on mirtazapine  7.5 mg tablet 1 nightly and relapsed when decreased to 1/tablet nightly,  so increase to 1 &  1/2 of the 7.5 mg tablets HS .   09/27/2021 appointment with the following noted: Last few weeks tough. Cholecystectomy 09/08/21 DT chronic nausea but not better.  Yesterday no nausea for first time in months. Losing weight on bland diet. But it stopped. Chronic back pain after surgery. SE mirtazapine  dry mouth. Usually feels ok in AM but afternoons gets nausea.   Xanax  does help afternoon nausea so maybe it's anxiety. Exhausted from all of this.  Still blames some of it on  covid. Not sleepy with Xanax .   Sad bc can't function normally.  Usually not depressed. Sleep is fine. Plan:Was Much better with with anxiety, appetite and sleep on mirtazapine  7.5  mg tablet 1 nightly initially.  Nausea, depression and anxiety Are back again. Option increase to 15 mg .  Yes.  10/26/2021 appointment with the following noted: It helped some.  Still some nausea.  CT scan next week.  Off and on nausea without pattern.  Not clearly anxiety but Xanax  prn does help.  Frustrating that it is not predictable. Tolerated in crease in mirtazapine  15 with mild hangover.  Still some anxiety first thing in AM but not as bad as it was and is worse without Xanax .  No reason for the anxiety.   Not depressed but discouraged.  Tries to stay active and involved. Sleep is great always with Xanax . Chronic opiates DT back surgery for scoliosis Plan: Continue Xanax  0.25 mg BID for panic and GAD , but option increase TID bc helps nausea.  11/10/21 TC: Pt called reporting CT scan results fine. Requesting as discussed to change Mirtazepine to Olanzapine . MD: As discussed at the last office visit, if her GI work-up did not give a solution to her abdominal problems then it would be reasonable to try olanzapine  at low dose.  I have sent in a prescription for 2.5 mg 1 nightly.  That is the lowest dose made.  If she uses a pill splitter she may try cutting it in half.  1/2 tablet may be sufficient for her.  She could experience benefit within the first day or 2 but give it a week or 2 as long as she is tolerating it to be sure. In order to minimize the risk of daytime sleepiness take the olanzapine  2 to 3 hours before bedtime.  12/15/21 TC: Patient said she had increased the olanzapine  and is still c/o headache and has had some dizziness today. She said she did okay on the mirtazapine . She said you had her on 1-1/2 tabs of 7.5. She said it probably isn't 100%, but felt she did okay otherwise.   01/11/22 appt  noted: No solution to GI px.  Was told to get off hydrocodone  but can't DT pain. Olanzapine  tried with SE dizziness and HA after a month.  Without much benefit over mirtazapine . Overall ok.  Still some anxiety which doesn't want to eat but doing ok. Mirtazapine  7.5 mg HS helps. Sleep good.   Started seing [employer], good. About the same with mood  with some depression over heallth problems. Sees surgeon in October about pain problems. No panic but awakens with anxiety and general worry.   Less nausea.  02/27/22 TC: Does not appear that she is receiving any benefit from mirtazapine .  Mentioned in my notes that the obvious antidepressant choice would be duloxetine  because it will sometimes help with pain as well as depression and anxiety. Stop mirtazapine  and start duloxetine  20 mg capsule 1 daily for 1 week then 2 capsules daily. It will take 2 to 4 weeks to get any benefit from the duloxetine .  I would suggest in the interim that she try something that is stronger to help with nausea and anxiety but is likely to be insufficiently helpful for depression, therefore the need for duloxetine . So in addition to the duloxetine  start olanzapine  at the lowest dose 2.5 mg tablet 1 nightly which could help with nausea appetite and anxiety almost right away.  If it causes too much sleepiness have her take it 2 to 3 hours before bedtime and she can even cut it in half if she has to. I will send in both prescriptions.  03/06/22 TC:  On 10/16 you wanted pt to take olanzapine  and Cymbalta  to replace mirtazapine .She did not start the Cymbalta  but did take olanzapine .She had side effects including insomnia,hand tremors,blurred vision and increased back pain.She decided to stop the med.She still wants to try Cymbalta  but wants to know if it's ok to take with mirtazapine  because she decided to start back taking it.     04/13/22 appointment noted:  Tried olanzapine  twice with hand tremor and blurred  vision. Back to mirtazapine  15 mg HS.   Less nausea.  Able to gain 6 # but no appetite. Bottomed out in October. Never tried Cymbalta .  Some concern over SE with it. Increased gabapentin  to help pain with a little benefit. 300 BID and tolerating. In pain all day.  Tried Nucynta without help.   Mood is better than it was.  Working to accept chronic pain and limited activithy.  04/20/23 TC  Pt said that the cymbalta  is making her jittery and more anxious. She has cut the mirtazapine  to 7.5mg  not sure if working . Wants something different     MD response:   She is taking the lowest dose of dulxetine and not tolerating it so stop it. I don't have antoher option that will help pain.   If she thinks she is getting some benefit with mirtazapine  then continue it.  If not stop it too I would also suggest she come get the [place] genetic test I discussed with her.      06/20/22 appt noted: Mirtazapine  helped regain some wt and nausea is better for 6-8 weeks. Currently on 7.5 mg mirtazapine  and was up to 15 mg daily. Still some anxiety issues and needs Xanax . Heart burn without Depakote .  It's ok right now.   Peripheral nn stimulator in her back temporary for 2 mos.  No benefit so far.  Chronic pain.   Still seeing Debbie. Not markedly depressed.   Plan: Cont Depakote  for reflux.  Depakote  ER BRAND helped GI problems which were worse on generic.  But she wants to retry stopping it. Rec continue mirtazapine  bc helpful  09/18/22 apt noted: Still taking Depakote  ER 250 mg HS.  Reduced mirtazapine  to 3.75 mg HS. No N and regained lost wt.  Still some anxiety managed with mirtazapine .  GI is fine now.  Vocal problems.   Chronic back pain.  Accepting of this.  Still exercise.  Finished therapy with Alm.  Asks about CBD. Plan: Continue Xanax  0.25 mg BID for panic and GAD , but option increase TID bc helps nausea. Depakote  ER BRAND helped GI problems which were worse on generic.  But she wants to  retry stopping it. Option continue or stop mirtazapine   02/14/2023 appointment noted: Meds: Alprazolam  0.25 mg 3 times daily as needed anxiety usu just 0.125 AM, Depakote  ER 250 daily, gabapentin  300 mg 3 times daily, mirtazapine  3.75 mg nightly.  Takig hydrocodone  and muscle relaxer. SE no cog px. Decided to continue meds for now. When tried to stop Depakote  had more acid reflux. No CBD bc got sleepy.  Still CBP.  GI ok.   Sleep great. Anxiety is controlled without panic except rarely. 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  08/15/23 appt noted: Med: mirtazapine  7.5 mg HS, Alprazolam  0.25 mg 3 times daily as needed anxiety usu just 0.125 AM, Depakote  ER 250 daily, gabapentin  300 mg 3 times daily.   Takig hydrocodone  and muscle relaxer. Clemens in Nov with secondary EMA with anxiety and  more pain. .   Norovirus feb messed up GI and caused 10# wt loss.  So increased mirtazapine  7.5 mg HS for a couple of weeks. With alprazolam  only taking 0.125 or 0.25 prn anxiety. Doc friend rec rial amitriptyline  for pain. More down bc chronic pain.  Done all the PT  and just hurts so bad.  Plan: DC mirtazapine   Trial amitriptyline  10 mg HS for pain  10/15/23 appt noted: Med: mirtazapine  7.5 mg HS, Alprazolam  0.25 mg 3 times daily as needed anxiety usu just 0.125 AM, Depakote  ER 250 daily, gabapentin  300 mg 3 times daily.    SE sweating and anxiety with amitriptyline .  Mirtazapine  helps more with anxiety and appetite and sleep. Alprazolam  not needed for sleep.   Morphine  NR and stopped.  Mood frustrated with chronic pain 8/10.  Got desperate yesterday and looking on internet to try to find something to help.  Mood is not great.   May switch gabapentin  to Lyrica per other doctor. Plan trial increase mirtazapine  for pain and mood.    12/17/23 appt noted:  Med: incr and reduced mirtazapine  7.5 mg HS, Alprazolam  0.25 mg 3 times daily as needed anxiety usu just 0.125 AM, Depakote  ER 250  daily, switch gabapentin  to Lyrica per other doctor 100 mg TID No SE with higher mirtazapine  but wanted to reduce.   Trying to get off hydrocodone .   Dep better than last time.   Chronic back pain ongoing.  3 years since surgery.  Don't know why it still hurts.  Tries to pace herself.  Lyrica helps some and accupuncture helps some.  Ubaldo has been helpful with her.  Better than she anticipated handling her back pain.  Marval has been helpful in therapy.   Plan: Option increase mirtazapine  7.5 mg 1 and 1/2 tablets and HS, helps mood and appetite.  Alprazolam  0.25 mg 3 times daily as needed anxiety usu just 0.125 AM,  Depakote  ER 250 daily,  04/14/24 appt noted:  Med: incr and reduced mirtazapine  7.5 mg HS, Alprazolam  0.25 mg 3 times daily as needed anxiety usu just 0.125 AM, Depakote  ER 250 daily, continue gabapentin  per doctor 300 mg TID No SE with higher mirtazapine  but wanted to reduce.   Needs Depakote  to prevent HA.  Will be changing insurance so will need PA for brand Depakote . More HA lately. Didn't tolerate Lyrica.   No SE. Some vertigo when lays down and then stands but brief.   Mood and anxiety are good.   Had racing heart in Am for mos but it resolved.  If not enough sleep then anxious in the morning.   Saw neurologist for Migraine and had problems with the meds RX.  Aimovig  has helped..   Back surgeries Cervical 10 y ago.  Latest for scoliosis but didn't get pain relief.    Past Psychiatric Medication Trials:  Failed multiple other antidepressants,  Celexa chest pain Duloxetine  SE Nortriptyline hyper; amitriptyline  10 with SE sweating and NR Hydroxyzine  SE  nefazodone  & Depakote  for migraine,  Depakote  brand ER 250 helps heartburn and Migraine.  Generic failed. Gabapentin  300 TID, Lyrica SE Mirtazapine  7.5 mg with some benefit and also  Olanzapine  2.5 mg HS with SE dizziness and HA citalopram palpitations Trazodone Clonazepam did not help anxiety, alprazolam  helps  anxiety and sleep  Review of Systems:  Review of Systems  Constitutional:  Positive for appetite change.  Gastrointestinal:  Negative for abdominal pain and nausea.  Musculoskeletal:  Positive for arthralgias and back pain.  Neurological:  Negative for tremors.  Psychiatric/Behavioral:  Negative for agitation, behavioral problems, confusion, decreased concentration, dysphoric mood, hallucinations, self-injury, sleep disturbance and suicidal ideas. The patient is nervous/anxious. The patient is not hyperactive.     Medications: I have reviewed the patient's current medications.  Current Outpatient Medications  Medication Sig Dispense Refill   AIMOVIG  140 MG/ML SOAJ INJECT 140 MG INTO SKIN EVERY 30 DAYS 3 mL 3   ALPRAZolam  (XANAX ) 0.25 MG tablet Take 1 tablet (0.25 mg total) by mouth 3 (three) times daily as needed for anxiety. 270 tablet 1   Ascorbic Acid (VITAMIN C PO) Take 500 mg by mouth daily.     cholecalciferol (VITAMIN D3) 25 MCG (1000 UNIT) tablet Take 1,000 Units by mouth daily.     DEPAKOTE  ER 250 MG 24 hr tablet Take 1 tablet (250 mg total) by mouth daily. 90 tablet 3   estradiol  (ESTRACE ) 0.1 MG/GM vaginal cream Place 0.5 g vaginally 2 (two) times a week.     gabapentin  (NEURONTIN ) 300 MG capsule Take 300 mg by mouth 3 (three) times daily.     hyoscyamine  (LEVSIN  SL) 0.125 MG SL tablet Place 1 tablet (0.125 mg total) under the tongue every 6 (six) hours as needed. 30 tablet 0   Lactobacillus (ACIDOPHILUS) 100 MG CAPS Take 100 mg by mouth daily.     linaclotide  (LINZESS ) 290 MCG CAPS capsule Take 1 capsule (290 mcg total) by mouth daily before breakfast. 30 capsule 2   methocarbamol  (ROBAXIN ) 750 MG tablet Take 750 mg by mouth 3 (three) times daily.     mirtazapine  (REMERON ) 7.5 MG tablet TAKE 1 AND 1/2 TABLETS BY MOUTH  AT NIGHT (DISCONTINUE  AMITRIPTYLINE ) (Patient taking differently: Take 7.5 mg by mouth at bedtime.) 135 tablet 1   ondansetron  (ZOFRAN ) 4 MG tablet Take 1  tablet (4 mg total) by mouth 2 (two) times daily as needed for nausea or vomiting. 60 tablet 1   PREVIDENT 5000 SENSITIVE 1.1-5 % GEL Place onto teeth.     Probiotic Product (RESTORA PO) Take by mouth.     Rimegepant Sulfate (NURTEC) 75 MG TBDP Take 1 tablet (75 mg total) by mouth daily as needed (take for abortive therapy of migraine, no more than 1 tablet in 24 hours or 10 per month). 8 tablet    Calcium Carbonate (CALCIUM 600 PO) Take 600-1,200 mg by mouth daily.     denosumab  (PROLIA ) 60 MG/ML SOSY injection      famotidine  (PEPCID ) 40 MG tablet TAKE 1 TABLET BY MOUTH DAILY 30 tablet 3   HYDROcodone -acetaminophen  (NORCO) 7.5-325 MG tablet Take 1.5 tablets by mouth every 6 (six) hours as needed for moderate pain.     IMVEXXY  MAINTENANCE PACK 4 MCG INST Place vaginally.     morphine  (MS CONTIN ) 15 MG 12 hr tablet SMARTSIG:1 Tablet(s) By Mouth Every 12 Hours     Multiple Vitamin (MULTIVITAMIN) tablet Take 1 tablet by mouth daily.     naloxone (NARCAN) nasal spray 4 mg/0.1 mL Place 0.1 mLs into the nose.     polyethylene glycol (MIRALAX / GLYCOLAX) 17 g packet Take 17 g by mouth daily.     Current Facility-Administered Medications  Medication Dose Route Frequency Provider Last Rate Last Admin   0.9 %  sodium chloride  infusion  500 mL Intravenous Once Cunningham, Scott E, MD       [START ON 07/06/2024] denosumab  (PROLIA ) injection 60 mg  60 mg Subcutaneous Once Gherghe, Cristina, MD  Medication Side Effects: None  Allergies:  Allergies  Allergen Reactions   Atarax  [Hydroxyzine ] Other (See Comments)    Headache, depression   Celexa [Citalopram Hydrobromide] Other (See Comments)    Chest pain   Flagyl  [Metronidazole ] Other (See Comments)    Dizzy and increased heart rate   Nortriptyline Itching   Orphenadrine Other (See Comments)    Hand tremors   Penicillins Other (See Comments)    Seizures as a child   Requip [Ropinirole] Other (See Comments)    Made sx worse   Prilosec  [Omeprazole] Other (See Comments)    Abdominal pain   Darvocet [Propoxyphene N-Acetaminophen ] Itching   Erythromycin Other (See Comments)    Upset stomach   Percocet [Oxycodone -Acetaminophen ] Itching   Sulfamethoxazole -Trimethoprim  Nausea Only and Other (See Comments)    Past Medical History:  Diagnosis Date   Allergy    Anxiety    Arthritis    Blood transfusion without reported diagnosis    Cervical dysplasia    Diverticulitis    Endometriosis    Fatigue    Gallstones    GERD (gastroesophageal reflux disease)    History of hiatal hernia    pt states she currently has a hiatal hernia   HSV infection    oral   IBS (irritable bowel syndrome)    Migraine    Osteopenia    Osteoporosis 01/2019   T score -3.1   Osteoporosis    Plantar fasciitis    PONV (postoperative nausea and vomiting)    also difficult to wake up   Recurrent vaginitis    Reflux    Scoliosis    Wears glasses     Family History  Problem Relation Age of Onset   Cancer Father        lymphoma   Other Mother        bipolar,reflux   Bipolar disorder Mother    Other Brother        sinus problems   Cancer Maternal Aunt        uterine cancer   Breast cancer Maternal Aunt        40's   Diabetes Maternal Aunt    Cancer Paternal Aunt        Colon cancer   Breast cancer Cousin 50       Mat. 1st cousin    Social History   Socioeconomic History   Marital status: Married    Spouse name: Ubaldo   Number of children: 0   Years of education: 16   Highest education level: Not on file  Occupational History    Comment: [employer]  Tobacco Use   Smoking status: Former    Current packs/day: 0.00    Types: Cigarettes    Quit date: 05/17/1983    Years since quitting: 40.9   Smokeless tobacco: Never  Vaping Use   Vaping status: Never Used  Substance and Sexual Activity   Alcohol use: Not Currently   Drug use: No   Sexual activity: Not Currently    Birth control/protection:  Post-menopausal    Comment: intercourse age 67 , sexual partners more than 5  Other Topics Concern   Not on file  Social History Narrative   Pt is married, no children.  Occupation: retired from [employer].    Caffeine - very little.   Right handed   Lives at home with her husband   Social Drivers of Corporate Investment Banker Strain: Not on file  Food Insecurity: Not on file  Transportation Needs: Not on file  Physical Activity: Not on file  Stress: Not on file  Social Connections: Not on file  Intimate Partner Violence: Not on file    Past Medical History, Surgical history, Social history, and Family history were reviewed and updated as appropriate.   Please see review of systems for further details on the patient's review from today.   Objective:   Physical Exam:  There were no vitals taken for this visit.  Physical Exam Constitutional:      General: She is not in acute distress.    Appearance: She is well-developed.  Musculoskeletal:        General: No deformity.  Neurological:     Mental Status: She is alert and oriented to person, place, and time.     Cranial Nerves: No dysarthria.     Coordination: Coordination normal.  Psychiatric:        Attention and Perception: Attention and perception normal.        Mood and Affect: Mood is not anxious or depressed. Affect is not labile, blunt, angry, tearful or inappropriate.        Speech: Speech normal.        Behavior: Behavior normal. Behavior is cooperative.        Thought Content: Thought content normal. Thought content is not paranoid or delusional. Thought content does not include homicidal or suicidal ideation. Thought content does not include suicidal plan.        Cognition and Memory: Cognition and memory normal.        Judgment: Judgment normal.     Comments: Insight intact Dep is better       Lab Review:     Component Value Date/Time   NA 139 10/19/2023 1130   NA 134 03/26/2020  1056   K 4.3 10/19/2023 1130   CL 99 10/19/2023 1130   CO2 33 (H) 10/19/2023 1130   GLUCOSE 101 (H) 10/19/2023 1130   BUN 4 (L) 10/19/2023 1130   BUN 8 03/26/2020 1056   CREATININE 0.79 10/19/2023 1130   CALCIUM 9.5 10/19/2023 1130   PROT 7.5 10/19/2023 1130   ALBUMIN 4.8 10/19/2023 1130   AST 18 10/19/2023 1130   ALT 13 10/19/2023 1130   ALKPHOS 34 (L) 10/19/2023 1130   BILITOT 0.3 10/19/2023 1130   GFRNONAA >60 08/31/2021 1100   GFRAA 91 03/26/2020 1056       Component Value Date/Time   WBC 4.5 10/19/2023 1130   RBC 4.35 10/19/2023 1130   HGB 13.1 10/19/2023 1130   HCT 39.3 10/19/2023 1130   PLT 250.0 10/19/2023 1130   MCV 90.3 10/19/2023 1130   MCH 30.6 08/31/2021 1100   MCHC 33.3 10/19/2023 1130   RDW 13.0 10/19/2023 1130   LYMPHSABS 1.3 10/19/2023 1130   MONOABS 0.6 10/19/2023 1130   EOSABS 0.0 10/19/2023 1130   BASOSABS 0.0 10/19/2023 1130    Normal liver enzymes in September.  No results found for: POCLITH, LITHIUM   No results found for: PHENYTOIN, PHENOBARB, VALPROATE, CBMZ   .res Assessment: Plan:    Yolanda Carroll was seen today for follow-up.  Diagnoses and all orders for this visit:  Generalized anxiety disorder  Panic disorder without agoraphobia  Recurrent major depression in complete remission  Migraine without aura and without status migrainosus, not intractable  Insomnia due to mental condition  Gastroesophageal reflux disease without esophagitis    30 min time with patient was spent on counseling. SABRA  We discussed the following. Mood is better overall in the last months.   Was Much better with with anxiety, appetite and sleep on mirtazapine  7.5 mg tablet 1 nightly initially.    Depakote  ER BRAND helped GI problems which were worse on generic.  It is primarily used for migrain prevention and had breakthrough HA on generic.  Supportive therapy dealing with health problems.  Back pain ongoing.    Answered questions about CBD.  Option low dose Fetzima 20 for pain.  We discussed the short-term risks associated with benzodiazepines including sedation and increased fall risk among others.  Discussed long-term side effect risk including dependence, potential withdrawal symptoms, and the potential eventual d  ose-related risk of dementia. Disc newer studies that cast in doubt the relationship with dementia.  Not drowsy with alprazolam . Disc warnings about combo of Bz and opiates not relevant at this low dose. BC morning anxiety could try switching to Valium and she doesn't want to try it. Continue Xanax  0.25 mg BID for panic and GAD , but option increase TID bc helps nausea.  Consider Genesight testing bc med sensitivity and failures. ACTX partially reviewed but site is poorly designed and results difficult to see.  Disc this in detail previously.  But increase risk SJS with CBZ and oxcarb  Disc orthostatic hypotension and how to evaluate and tx.   Consider higher doses mirtazapine .  Disc SE.  Option increase again if needed.   Continue mirtazapine  7.5 mg 1 HS, helps mood and appetite.  Alprazolam  0.25 mg 3 times daily as needed anxiety usu just 0.125 AM,   Depakote  ER 250 daily,  Needs Depakote  to prevent HA.  Will be changing insurance so will need PA for brand Depakote .  Had breakthrough HA with generic and had SE with any higher dose of generic.  FU 3-4 mos  Lorene Macintosh, MD, DFAPA  Please see After Visit Summary for patient specific instructions.  Future Appointments  Date Time Provider Department Center  05/13/2024 10:00 AM [facility]  09/10/2024 10:30 AM Cottle, Lorene KANDICE Raddle., MD [facility] None  10/27/2024  2:00 PM Cary No, NP [facility] None  12/01/2024 10:00 AM Trixie File, MD [facility] None    No orders of the defined types were placed in this encounter.     -------------------------------

## 2024-04-29 ENCOUNTER — Encounter: Payer: Self-pay | Admitting: Family Medicine

## 2024-04-30 NOTE — Progress Notes (Unsigned)
 No chief complaint on file.   HISTORY OF PRESENT ILLNESS:  04/30/2024 ALL:  Yolanda Carroll returns for follow up for migraines. She was last seen 10/2023 and doing really well on Amovig and Nurtec. She called yesterday reporting concerns of dizziness. PCP advised follow up with neurology.   10/24/2023 ALL:  Yolanda Carroll returns for follow up for migraines. She was last seen 10/2022 and doing well on Amovig and Nurtec. She continues to do exceedingly well form a headache standpoint. She may have had a couple of milder headaches but no migraines since last visit. She hasn't needed to take any medication for headaches, recently. She is tolerating Amovig and Nurtec without any obvious adverse effects.   She continues to have significant back pain following extensive spinal surgery to correct scoliosis. She continues to see Dr Mary. She was recently switched from gabapentin  to Lyrica and hoping that may help. She is considering functional med versus ketamine treatments. She tries to stay active.   10/24/2022 ALL: Yolanda Carroll returns for follow up for migraines. She continues Amovig and Nurtec. She reports doing very well. She has not had any headaches since 11/2021. She has not needed Nurtec. She stays well hydrated. BP normally on lower side. She is asymptomatic. She recently had cataract surgery and is recovering well. She is feeling well and without concerns.   10/20/2021 ALL: Yolanda Carroll returns for follow up for migraines. She continues Amovig (PAP) and Nurtec. She reports doing very well. She has not had a migraine since 03/2021. She has not yet taken Nurtec but does have some just in case. She is having more nausea following cholecystectomy. She continues to recover from back surgery last July. She is exercising regularly. She feels mood is ok. She is disappointed that it taking her longer to heal. She is able to drive. She is followed closely by care team.   12/21/20 ALL (Mychart): Yolanda Carroll is a 68 y.o. female  here today for follow up. She continues Amovig but reports that copay is too expensive. She has failed Ajovy . Discussed trial of Emgality at last visit, however, unsure if this will be affordable. She is doing very well on Amovig. She is not having any headache days. She has Nurtec but has not needed to take it.   She had posterior T4-ilium fusion on 7/19. She was discharged from rehab 12/17/2020. She is doing very well. She is doing home exercises and walking daily.   Meds tried: Amovig (on now), Depakote , Imitrex , baclofen, Nortriptyline, Gabapentin , Nefazodone , Skelaxin, ketaprofen  10/20/2020 ALL:  Yolanda Carroll is a 68 y.o. female here today for follow up for migraines. She continues Amovig and Nurtec. She reports that headaches have resolved. She has not had to take Nurtec. She can't remember when she last had a migraine. She tried to wean divalproex  but had severe acid reflux. Psychiatry resumed divalproex . She is also taking Carafate. Since being diagnosed with Covid in 12/2019, she has had significant anxiety. She is being treated with alprazolam  (dose was increased) and started on mirtazapine .   HISTORY (copied from Dr Sharion previous note)  Interval history 08/05/2019:  She is still doing well on Aimovig . She came off of the nefazodone . She came off the depakote  and her migraines are stable but she was having other symptoms such as swallowing problems and maybe withdrawal symptoms, she is on pepcid  now, she doesn't want to go back on them. She continues the xanax  at bedtime and wants to stop that as well.  Ubrelvy  did not work. She has not had many headaches, she still has meclizine and she has taken that that aspirin  and ibuprofen for mild headaches. She has not had any severe headaches this year, mild and responded to the meclizine combo above. Gave her samples of Nurtec.   Interval history 08/01/2018: She went back to Aimovig  because it worked better. Ajovy  didn't work, it wore off.  The  aimovig  gave her constipation but she is working with GI and feels better. Discussed other acute medications, Ubrelvy  and others. Has had a difficult time with acute management. She feels tremendously improved.  In the past we have tried her on a combination of meds for acute management, she is tried multiple of them Cambia , Zofran , Relpax  and other triptans, baclofen, tramadol, Fioricet, zembrace and Zomig  and other triptan's.  Today we discussed some of the new medications such as Ubrogrepant and Lasmitidan.  I gave her samples of Ubrelvy  today and she will get back to us .   Interval History 01/29/2018; Tried her on a combination of meds for acute management (cambia , zofran , relpax ) was also taking imitrex  and baclofen. Tramadol in hte past did not help. Tried Fioricet at last appointment, made her dizzy and made her itch. Discussed trying other medications. Discussed oral medications. She has them worse in the winter, they can last up to 7 days. Discussed trying Zembrace and Zomig  nasal acutely and also starting Aimovig  for the next 3 months as she has worsening migraines in winter will provide samples and see how she does.   Interval history 03/12/2017: Tried a combination of medications, cambia , relpax  and zofran . Cambia  alone did not help. Zofran  and relpax  did not help when she got home. Took all three together later in the mirgaine didn;t help. But also tried it at onset of headache and did not stop the migraine. She had her last migraine at the end of August until the 17th of September. Then October 24th had another headache and the tylenol  worked. Lasted a few days, baclofen for neck pain but did not stop it until the 26th-28th. 63 days has had 41 headache free days. In 2 months had 22 migraine days. Season may also trigger. She has tried imitrex  PO. Tramadol shot in the past had not helped, migraine started on 6/27 and imitrex  did not help but she had the headache for several days so this was not at  onset. Tried cambia .    HPI:  Yolanda Carroll is a 68 y.o. female here as a referral from Dr. Verena for migraines. She is currently on Depakote , meloxicam, Imitrex , baclofen. She has a past medical history of osteoporosis, plantar fasciitis, migraine, irritable bowel syndrome, fatigue, neck and back pain with degenerative disc disease and radiculopathy, arthritis. She has had migraines since 2000. Started worsening in October. They can last up to 2 weeks straight. She has associated vertigo. She has done well on nefazodone  and depakote . The next headache was in June and lasted 2 weeks. It hurts behind the eyes, she can still function but is moderate in pain, weather triggers, she has light sensitivity, no significant nausea or vomiting. She has had nausea and vomiting in the past. Depakote  is working for her. Slowly progressive when they start. No medication overuse. No other focal neurologic deficits, associated symptoms, inciting events or modifiable factors.   Meds tried: Depakote , Imitrex , baclofen, Nortriptyline, Gabapentin , Nefazodone , Skelaxin, ketaprofen,    Reviewed notes, labs and imaging from outside physicians, which showe:    Personally  reviewed imaging and agree with following   MRI cervical spine 07/2013: 1. Mild worsening of the foraminal impingement at C7-T1 due to progressive spondylosis and degenerative disc disease. 2. Mild left foraminal impingement at C3-4, C5-6, and C6-7 primarily due to spurring. Although there is some residual left foraminal impingement at the postoperative levels, the degree of impingement at these levels is much less than on the preoperative exam.    REVIEW OF SYSTEMS: Out of a complete 14 system review of symptoms, the patient complains only of the following symptoms, acid reflux, anxiety, back pain, and all other reviewed systems are negative.   ALLERGIES: Allergies  Allergen Reactions   Atarax  [Hydroxyzine ] Other (See Comments)    Headache,  depression   Celexa [Citalopram Hydrobromide] Other (See Comments)    Chest pain   Flagyl  [Metronidazole ] Other (See Comments)    Dizzy and increased heart rate   Nortriptyline Itching   Orphenadrine Other (See Comments)    Hand tremors   Penicillins Other (See Comments)    Seizures as a child   Requip [Ropinirole] Other (See Comments)    Made sx worse   Prilosec [Omeprazole] Other (See Comments)    Abdominal pain   Darvocet [Propoxyphene N-Acetaminophen ] Itching   Erythromycin Other (See Comments)    Upset stomach   Percocet [Oxycodone -Acetaminophen ] Itching   Sulfamethoxazole -Trimethoprim  Nausea Only and Other (See Comments)     HOME MEDICATIONS: Outpatient Medications Prior to Visit  Medication Sig Dispense Refill   AIMOVIG  140 MG/ML SOAJ INJECT 140 MG INTO SKIN EVERY 30 DAYS 3 mL 3   ALPRAZolam  (XANAX ) 0.25 MG tablet Take 1 tablet (0.25 mg total) by mouth 3 (three) times daily as needed for anxiety. 270 tablet 1   Ascorbic Acid (VITAMIN C PO) Take 500 mg by mouth daily.     Calcium Carbonate (CALCIUM 600 PO) Take 600-1,200 mg by mouth daily.     cholecalciferol (VITAMIN D3) 25 MCG (1000 UNIT) tablet Take 1,000 Units by mouth daily.     denosumab  (PROLIA ) 60 MG/ML SOSY injection      DEPAKOTE  ER 250 MG 24 hr tablet Take 1 tablet (250 mg total) by mouth daily. 90 tablet 3   estradiol  (ESTRACE ) 0.1 MG/GM vaginal cream Place 0.5 g vaginally 2 (two) times a week.     famotidine  (PEPCID ) 40 MG tablet TAKE 1 TABLET BY MOUTH DAILY 30 tablet 3   gabapentin  (NEURONTIN ) 300 MG capsule Take 300 mg by mouth 3 (three) times daily.     HYDROcodone -acetaminophen  (NORCO) 7.5-325 MG tablet Take 1.5 tablets by mouth every 6 (six) hours as needed for moderate pain.     hyoscyamine  (LEVSIN  SL) 0.125 MG SL tablet Place 1 tablet (0.125 mg total) under the tongue every 6 (six) hours as needed. 30 tablet 0   IMVEXXY  MAINTENANCE PACK 4 MCG INST Place vaginally.     Lactobacillus (ACIDOPHILUS) 100 MG  CAPS Take 100 mg by mouth daily.     linaclotide  (LINZESS ) 290 MCG CAPS capsule Take 1 capsule (290 mcg total) by mouth daily before breakfast. 30 capsule 2   methocarbamol  (ROBAXIN ) 750 MG tablet Take 750 mg by mouth 3 (three) times daily.     mirtazapine  (REMERON ) 7.5 MG tablet TAKE 1 AND 1/2 TABLETS BY MOUTH  AT NIGHT (DISCONTINUE  AMITRIPTYLINE ) (Patient taking differently: Take 7.5 mg by mouth at bedtime.) 135 tablet 1   morphine  (MS CONTIN ) 15 MG 12 hr tablet SMARTSIG:1 Tablet(s) By Mouth Every 12 Hours  Multiple Vitamin (MULTIVITAMIN) tablet Take 1 tablet by mouth daily.     naloxone (NARCAN) nasal spray 4 mg/0.1 mL Place 0.1 mLs into the nose.     ondansetron  (ZOFRAN ) 4 MG tablet Take 1 tablet (4 mg total) by mouth 2 (two) times daily as needed for nausea or vomiting. 60 tablet 1   polyethylene glycol (MIRALAX / GLYCOLAX) 17 g packet Take 17 g by mouth daily.     PREVIDENT 5000 SENSITIVE 1.1-5 % GEL Place onto teeth.     Probiotic Product (RESTORA PO) Take by mouth.     Rimegepant Sulfate (NURTEC) 75 MG TBDP Take 1 tablet (75 mg total) by mouth daily as needed (take for abortive therapy of migraine, no more than 1 tablet in 24 hours or 10 per month). 8 tablet    Facility-Administered Medications Prior to Visit  Medication Dose Route Frequency Provider Last Rate Last Admin   0.9 %  sodium chloride  infusion  500 mL Intravenous Once Stacia Glendia BRAVO, MD       NOREEN ON 07/06/2024] denosumab  (PROLIA ) injection 60 mg  60 mg Subcutaneous Once Gherghe, Cristina, MD         PAST MEDICAL HISTORY: Past Medical History:  Diagnosis Date   Allergy    Anxiety    Arthritis    Blood transfusion without reported diagnosis    Cervical dysplasia    Diverticulitis    Endometriosis    Fatigue    Gallstones    GERD (gastroesophageal reflux disease)    History of hiatal hernia    pt states she currently has a hiatal hernia   HSV infection    oral   IBS (irritable bowel syndrome)     Migraine    Osteopenia    Osteoporosis 01/2019   T score -3.1   Osteoporosis    Plantar fasciitis    PONV (postoperative nausea and vomiting)    also difficult to wake up   Recurrent vaginitis    Reflux    Scoliosis    Wears glasses      PAST SURGICAL HISTORY: Past Surgical History:  Procedure Laterality Date   ANTERIOR CERVICAL DECOMP/DISCECTOMY FUSION  04/17/2011   Procedure: ANTERIOR CERVICAL DECOMPRESSION/DISCECTOMY FUSION 2 LEVELS;  Surgeon: Lamar LELON Peaches;  Location: [facility];  Service: Neurosurgery;  Laterality: N/A;  Cervical five-six, six-seven anterior cervical decompression with fusion,  plating,  and bonegraft    APPENDECTOMY  1978   BACK SURGERY N/A    2022   BREAST BIOPSY  07/13/2011   Procedure: BREAST BIOPSY WITH NEEDLE LOCALIZATION;  Surgeon: Donnice Bury, MD;  Location: [facility];  Service: General;  Laterality: Right;  Right breast wire localization biopsy   BREAST EXCISIONAL BIOPSY Right 2013   BREAST SURGERY     Breast Bx-Benign   CATARACT EXTRACTION Bilateral 11/06/2022   CHOLECYSTECTOMY N/A 09/08/2021   Procedure: LAPAROSCOPIC CHOLECYSTECTOMY;  Surgeon: Vanderbilt Ned, MD;  Location: [facility];  Service: General;  Laterality: N/A;   CYSTOSCOPY     GYNECOLOGIC CRYOSURGERY     HERNIA REPAIR  08/02/1995   RIH   LAPAROSCOPIC ENDOMETRIOSIS FULGURATION  1997   PELVIC LAPAROSCOPY     ROTATOR CUFF REPAIR     right 2002 left 2000     FAMILY HISTORY: Family History  Problem Relation Age of Onset   Cancer Father        lymphoma   Other Mother        bipolar,reflux   Bipolar  disorder Mother    Other Brother        sinus problems   Cancer Maternal Aunt        uterine cancer   Breast cancer Maternal Aunt        40's   Diabetes Maternal Aunt    Cancer Paternal Aunt        Colon cancer   Breast cancer Cousin 50       Mat. 1st cousin     SOCIAL HISTORY: Social History   Socioeconomic History   Marital status: Married     Spouse name: Ubaldo   Number of children: 0   Years of education: 16   Highest education level: Not on file  Occupational History    Comment: [employer]  Tobacco Use   Smoking status: Former    Current packs/day: 0.00    Types: Cigarettes    Quit date: 05/17/1983    Years since quitting: 40.9   Smokeless tobacco: Never  Vaping Use   Vaping status: Never Used  Substance and Sexual Activity   Alcohol use: Not Currently   Drug use: No   Sexual activity: Not Currently    Birth control/protection: Post-menopausal    Comment: intercourse age 48 , sexual partners more than 5  Other Topics Concern   Not on file  Social History Narrative   Pt is married, no children.  Occupation: retired from [employer].    Caffeine - very little.   Right handed   Lives at home with her husband   Social Drivers of Health   Tobacco Use: Medium Risk (04/14/2024)   Patient History    Smoking Tobacco Use: Former    Smokeless Tobacco Use: Never    Passive Exposure: Not on Actuary Strain: Not on file  Food Insecurity: Not on file  Transportation Needs: Not on file  Physical Activity: Not on file  Stress: Not on file  Social Connections: Not on file  Intimate Partner Violence: Not on file  Depression (EYV7-0): Not on file  Alcohol Screen: Not on file  Housing: Not on file  Utilities: Not on file  Health Literacy: Not on file      PHYSICAL EXAM  There were no vitals filed for this visit.     There is no height or weight on file to calculate BMI.   Generalized: Well developed, in no acute distress  Cardiology: normal rate and rhythm, no murmur auscultated  Respiratory: clear to auscultation bilaterally    Neurological examination  Mentation: Alert oriented to time, place, history taking. Follows all commands speech and language fluent Cranial nerve II-XII: Facial sensation and strength were normal. Head turning and shoulder  shrug  were normal and symmetric. Motor: The motor testing reveals 5 over 5 strength of all 4 extremities. Good symmetric motor tone is noted throughout.  Gait and station: Gait is normal.     DIAGNOSTIC DATA (LABS, IMAGING, TESTING) - I reviewed patient records, labs, notes, testing and imaging myself where available.  Lab Results  Component Value Date   WBC 4.5 10/19/2023   HGB 13.1 10/19/2023   HCT 39.3 10/19/2023   MCV 90.3 10/19/2023   PLT 250.0 10/19/2023      Component Value Date/Time   NA 139 10/19/2023 1130   NA 134 03/26/2020 1056   K 4.3 10/19/2023 1130   CL 99 10/19/2023 1130   CO2 33 (H) 10/19/2023 1130   GLUCOSE 101 (H) 10/19/2023 1130  BUN 4 (L) 10/19/2023 1130   BUN 8 03/26/2020 1056   CREATININE 0.79 10/19/2023 1130   CALCIUM 9.5 10/19/2023 1130   PROT 7.5 10/19/2023 1130   ALBUMIN 4.8 10/19/2023 1130   AST 18 10/19/2023 1130   ALT 13 10/19/2023 1130   ALKPHOS 34 (L) 10/19/2023 1130   BILITOT 0.3 10/19/2023 1130   GFRNONAA >60 08/31/2021 1100   GFRAA 91 03/26/2020 1056   No results found for: CHOL, HDL, LDLCALC, LDLDIRECT, TRIG, CHOLHDL No results found for: YHAJ8R No results found for: VITAMINB12 No results found for: TSH      No data to display               No data to display           ASSESSMENT AND PLAN  68 y.o. year old female  has a past medical history of Allergy, Anxiety, Arthritis, Blood transfusion without reported diagnosis, Cervical dysplasia, Diverticulitis, Endometriosis, Fatigue, Gallstones, GERD (gastroesophageal reflux disease), History of hiatal hernia, HSV infection, IBS (irritable bowel syndrome), Migraine, Osteopenia, Osteoporosis (01/2019), Osteoporosis, Plantar fasciitis, PONV (postoperative nausea and vomiting), Recurrent vaginitis, Reflux, Scoliosis, and Wears glasses. here with    No diagnosis found.  Aroush is doing very well from a headache standpoint. We will continue Amovig and Nurtec.  She is receiving Amovig through PAP and will call when updated rx is needed. She will continue to work with care team for back pain, anxiety and GERD. Healthy lifestyle habits encouraged. She will follow up with me in 1 year, sooner if needed.   No orders of the defined types were placed in this encounter.    No orders of the defined types were placed in this encounter.   I personally spent a total of 30 minutes in the care of the patient today including preparing to see the patient, getting/reviewing separately obtained history, performing a medically appropriate exam/evaluation, counseling and educating, placing orders, and documenting clinical information in the EHR.   Greig Forbes, MSN, FNP-C 04/30/2024, 4:38 PM  [facility] [address] [place], [place] [address] [phone]

## 2024-04-30 NOTE — Patient Instructions (Signed)
 Below is our plan:  We will continue Amovig and Nurtec. We will send you to vestibular therapy. I have called in a steroid taper. Use meclizine as needed. Try Epley maneuver at home.    Please make sure you are staying well hydrated. I recommend 50-60 ounces daily. Well balanced diet and regular exercise encouraged. Consistent sleep schedule with 6-8 hours recommended.   Please continue follow up with care team as directed.   Follow up with me in June 2026  You may receive a survey regarding today's visit. I encourage you to leave honest feed back as I do use this information to improve patient care. Thank you for seeing me today!   GENERAL HEADACHE INFORMATION:   Natural supplements: Magnesium  Oxide or Magnesium  Glycinate 500 mg at bed (up to 800 mg daily) Coenzyme Q10 300 mg in AM Vitamin B2- 200 mg twice a day   Add 1 supplement at a time since even natural supplements can have undesirable side effects. You can sometimes buy supplements cheaper (especially Coenzyme Q10) at www.webmailguide.co.za or at [facility].  Migraine with aura: There is increased risk for stroke in women with migraine with aura and a contraindication for the combined contraceptive pill for use by women who have migraine with aura. The risk for women with migraine without aura is lower. However other risk factors like smoking are far more likely to increase stroke risk than migraine. There is a recommendation for no smoking and for the use of OCPs without estrogen such as progestogen only pills particularly for women with migraine with aura.SABRA People who have migraine headaches with auras may be 3 times more likely to have a stroke caused by a blood clot, compared to migraine patients who don't see auras. Women who take hormone-replacement therapy may be 30 percent more likely to suffer a clot-based stroke than women not taking medication containing estrogen. Other risk factors like smoking and high blood pressure may be  much more  important.    Vitamins and herbs that show potential:   Magnesium : Magnesium  (250 mg twice a day or 500 mg at bed) has a relaxant effect on smooth muscles such as blood vessels. Individuals suffering from frequent or daily headache usually have low magnesium  levels which can be increase with daily supplementation of 400-750 mg. Three trials found 40-90% average headache reduction  when used as a preventative. Magnesium  may help with headaches are aura, the best evidence for magnesium  is for migraine with aura is its thought to stop the cortical spreading depression we believe is the pathophysiology of migraine aura.Magnesium  also demonstrated the benefit in menstrually related migraine.  Magnesium  is part of the messenger system in the serotonin cascade and it is a good muscle relaxant.  It is also useful for constipation which can be a side effect of other medications used to treat migraine. Good sources include nuts, whole grains, and tomatoes. Side Effects: loose stool/diarrhea  Riboflavin (vitamin B 2) 200 mg twice a day. This vitamin assists nerve cells in the production of ATP a principal energy storing molecule.  It is necessary for many chemical reactions in the body.  There have been at least 3 clinical trials of riboflavin using 400 mg per day all of which suggested that migraine frequency can be decreased.  All 3 trials showed significant improvement in over half of migraine sufferers.  The supplement is found in bread, cereal, milk, meat, and poultry.  Most Americans get more riboflavin than the recommended daily allowance, however  riboflavin deficiency is not necessary for the supplements to help prevent headache. Side effects: energizing, green urine   Coenzyme Q10: This is present in almost all cells in the body and is critical component for the conversion of energy.  Recent studies have shown that a nutritional supplement of CoQ10 can reduce the frequency of migraine attacks by improving the  energy production of cells as with riboflavin.  Doses of 150 mg twice a day have been shown to be effective.   Melatonin: Increasing evidence shows correlation between melatonin secretion and headache conditions.  Melatonin supplementation has decreased headache intensity and duration.  It is widely used as a sleep aid.  Sleep is natures way of dealing with migraine.  A dose of 3 mg is recommended to start for headaches including cluster headache. Higher doses up to 15 mg has been reviewed for use in Cluster headache and have been used. The rationale behind using melatonin for cluster is that many theories regarding the cause of Cluster headache center around the disruption of the normal circadian rhythm in the brain.  This helps restore the normal circadian rhythm.   HEADACHE DIET: Foods and beverages which may trigger migraine Note that only 20% of headache patients are food sensitive. You will know if you are food sensitive if you get a headache consistently 20 minutes to 2 hours after eating a certain food. Only cut out a food if it causes headaches, otherwise you might remove foods you enjoy! What matters most for diet is to eat a well balanced healthy diet full of vegetables and low fat protein, and to not miss meals.   Chocolate, other sweets ALL cheeses except cottage and cream cheese Dairy products, yogurt, sour cream, ice cream Liver Meat extracts (Bovril, Marmite, meat tenderizers) Meats or fish which have undergone aging, fermenting, pickling or smoking. These include: Hotdogs,salami,Lox,sausage, mortadellas,smoked salmon, pepperoni, Pickled herring Pods of broad bean (English beans, Chinese pea pods, Italian (fava) beans, lima and navy beans Ripe avocado, ripe banana Yeast extracts or active yeast preparations such as Brewer's or Fleishman's (commercial bakes goods are permitted) Tomato based foods, pizza (lasagna, etc.)   MSG (monosodium glutamate) is disguised as many things; look  for these common aliases: Monopotassium glutamate Autolysed yeast Hydrolysed protein Sodium caseinate flavorings all natural preservatives Nutrasweet   Avoid all other foods that convincingly provoke headaches.   Resources: The Dizzy Bluford Aid Your Headache Diet, migrainestrong.com  https://zamora-andrews.com/   Caffeine  and Migraine For patients that have migraine, caffeine  intake more than 3 days per week can lead to dependency and increased migraine frequency. I would recommend cutting back on your caffeine  intake as best you can. The recommended amount of caffeine  is 200-300 mg daily, although migraine patients may experience dependency at even lower doses. While you may notice an increase in headache temporarily, cutting back will be helpful for headaches in the long run. For more information on caffeine  and migraine, visit: https://americanmigrainefoundation.org/resource-library/caffeine -and-migraine/   Headache Prevention Strategies:   1. Maintain a headache diary; learn to identify and avoid triggers.  - This can be a simple note where you log when you had a headache, associated symptoms, and medications used - There are several smartphone apps developed to help track migraines: Migraine Buddy, Migraine Monitor, Curelator N1-Headache App   Common triggers include: Emotional triggers: Emotional/Upset family or friends Emotional/Upset occupation Business reversal/success Anticipation anxiety Crisis-serious Post-crisis periodNew job/position   Physical triggers: Vacation Day Weekend Strenuous Exercise High Altitude Location New Move Menstrual Day Physical  Illness Oversleep/Not enough sleep Weather changes Light: Photophobia or light sesnitivity treatment involves a balance between desensitization and reduction in overly strong input. Use dark polarized glasses outside, but not inside. Avoid bright or fluorescent light,  but do not dim environment to the point that going into a normally lit room hurts. Consider FL-41 tint lenses, which reduce the most irritating wavelengths without blocking too much light.  These can be obtained at axonoptics.com or theraspecs.com Foods: see list above.   2. Limit use of acute treatments (over-the-counter medications, triptans, etc.) to no more than 2 days per week or 10 days per month to prevent medication overuse headache (rebound headache).     3. Follow a regular schedule (including weekends and holidays): Don't skip meals. Eat a balanced diet. 8 hours of sleep nightly. Minimize stress. Exercise 30 minutes per day. Being overweight is associated with a 5 times increased risk of chronic migraine. Keep well hydrated and drink 6-8 glasses of water per day.   4. Initiate non-pharmacologic measures at the earliest onset of your headache. Rest and quiet environment. Relax and reduce stress. Breathe2Relax is a free app that can instruct you on    some simple relaxtion and breathing techniques. Http://Dawnbuse.com is a    free website that provides teaching videos on relaxation.  Also, there are  many apps that   can be downloaded for mindful relaxation.  An app called YOGA NIDRA will help walk you through mindfulness. Another app called Calm can be downloaded to give you a structured mindfulness guide with daily reminders and skill development. Headspace for guided meditation Mindfulness Based Stress Reduction Online Course: www.palousemindfulness.com Cold compresses.   5. Don't wait!! Take the maximum allowable dosage of prescribed medication at the first sign of migraine.   6. Compliance:  Take prescribed medication regularly as directed and at the first sign of a migraine.   7. Communicate:  Call your physician when problems arise, especially if your headaches change, increase in frequency/severity, or become associated with neurological symptoms (weakness, numbness, slurred  speech, etc.). Proceed to emergency room if you experience new or worsening symptoms or symptoms do not resolve, if you have new neurologic symptoms or if headache is severe, or for any concerning symptom.   8. Headache/pain management therapies: Consider various complementary methods, including medication, behavioral therapy, psychological counselling, biofeedback, massage therapy, acupuncture, dry needling, and other modalities.  Such measures may reduce the need for medications. Counseling for pain management, where patients learn to function and ignore/minimize their pain, seems to work very well.   9. Recommend changing family's attention and focus away from patient's headaches. Instead, emphasize daily activities. If first question of day is 'How are your headaches/Do you have a headache today?', then patient will constantly think about headaches, thus making them worse. Goal is to re-direct attention away from headaches, toward daily activities and other distractions.   10. Helpful Websites: www.AmericanHeadacheSociety.org patenthood.ch www.headaches.org tightmarket.nl www.achenet.org

## 2024-05-01 ENCOUNTER — Ambulatory Visit (INDEPENDENT_AMBULATORY_CARE_PROVIDER_SITE_OTHER): Admitting: Family Medicine

## 2024-05-01 ENCOUNTER — Encounter: Payer: Self-pay | Admitting: Family Medicine

## 2024-05-01 VITALS — BP 118/72 | HR 70 | Resp 17 | Ht 67.0 in | Wt 138.0 lb

## 2024-05-01 DIAGNOSIS — R42 Dizziness and giddiness: Secondary | ICD-10-CM | POA: Diagnosis not present

## 2024-05-01 DIAGNOSIS — G43001 Migraine without aura, not intractable, with status migrainosus: Secondary | ICD-10-CM

## 2024-05-01 NOTE — Telephone Encounter (Signed)
 Can a referral be placed  for us  to send , I see an order  has been placed , I will need the referral  to send to facility Pt  recommended.

## 2024-05-01 NOTE — Addendum Note (Signed)
 Addended by: CARY NO L on: 05/01/2024 04:22 PM   Modules accepted: Orders

## 2024-05-05 ENCOUNTER — Telehealth: Payer: Self-pay | Admitting: Family Medicine

## 2024-05-05 NOTE — Telephone Encounter (Signed)
 Referral  for physical therapy fax to [facility]. Phone: [phone], Fax: [phone]

## 2024-05-05 NOTE — Addendum Note (Signed)
 Addended by: HILLIARD HEATHER CROME on: 05/05/2024 02:43 PM   Modules accepted: Orders

## 2024-05-13 ENCOUNTER — Ambulatory Visit
Admission: RE | Admit: 2024-05-13 | Discharge: 2024-05-13 | Disposition: A | Discharge status: Home / Self Care | Source: Ambulatory Visit | Attending: Gynecology | Admitting: Gynecology

## 2024-05-13 DIAGNOSIS — Z1231 Encounter for screening mammogram for malignant neoplasm of breast: Secondary | ICD-10-CM

## 2024-05-19 ENCOUNTER — Other Ambulatory Visit: Payer: Self-pay | Admitting: Psychiatry

## 2024-05-19 ENCOUNTER — Telehealth: Payer: Self-pay | Admitting: Psychiatry

## 2024-05-19 DIAGNOSIS — F5105 Insomnia due to other mental disorder: Secondary | ICD-10-CM

## 2024-05-19 DIAGNOSIS — F410 Panic disorder [episodic paroxysmal anxiety] without agoraphobia: Secondary | ICD-10-CM

## 2024-05-19 DIAGNOSIS — F3342 Major depressive disorder, recurrent, in full remission: Secondary | ICD-10-CM

## 2024-05-19 DIAGNOSIS — K219 Gastro-esophageal reflux disease without esophagitis: Secondary | ICD-10-CM

## 2024-05-19 MED ORDER — DEPAKOTE ER 250 MG PO TB24: 250.0000 mg | ORAL_TABLET | Freq: Every day | ORAL | 0 refills | Status: AC

## 2024-05-19 NOTE — Telephone Encounter (Signed)
 Sent!

## 2024-05-19 NOTE — Telephone Encounter (Signed)
 Pt states she needs a script for Depakote  ER sent to her local pharmacy. Scanned in her new prescription card    [facility]

## 2024-06-10 NOTE — Telephone Encounter (Signed)
 Prolia  VOB initiated via [email or web address]  Next Prolia  inj DUE: 07/07/24

## 2024-06-11 ENCOUNTER — Other Ambulatory Visit: Payer: Self-pay | Admitting: Gastroenterology

## 2024-06-16 ENCOUNTER — Telehealth: Payer: Self-pay | Admitting: Psychiatry

## 2024-06-16 NOTE — Telephone Encounter (Signed)
 Noted will check into PA, nothing received

## 2024-06-17 NOTE — Telephone Encounter (Signed)
 PA approved 06/16/24-05/14/2098 Depakote  ER 250 mg #90/90 day [employer]

## 2024-06-20 NOTE — Telephone Encounter (Signed)
 Called patient to schedule and she states that she is driving right now and will call back to schedule Prolia  injection.

## 2024-07-11 ENCOUNTER — Ambulatory Visit

## 2024-09-10 ENCOUNTER — Ambulatory Visit: Admitting: Psychiatry

## 2024-10-27 ENCOUNTER — Ambulatory Visit: Admitting: Family Medicine

## 2024-10-29 ENCOUNTER — Ambulatory Visit: Admitting: Family Medicine

## 2024-11-19 ENCOUNTER — Ambulatory Visit: Admitting: Internal Medicine

## 2024-12-01 ENCOUNTER — Ambulatory Visit: Admitting: Internal Medicine
